# Patient Record
Sex: Male | Born: 1945 | Race: White | Hispanic: No | Marital: Single | Smoking: Former smoker
Health system: Southern US, Community
[De-identification: ages and names within clinical notes are randomized; demographics above are authoritative.]

## PROBLEM LIST (undated history)

## (undated) ENCOUNTER — Ambulatory Visit: Admission: EM | Payer: No Typology Code available for payment source

## (undated) DIAGNOSIS — E119 Type 2 diabetes mellitus without complications: Secondary | ICD-10-CM

## (undated) DIAGNOSIS — I10 Essential (primary) hypertension: Secondary | ICD-10-CM

## (undated) DIAGNOSIS — C801 Malignant (primary) neoplasm, unspecified: Secondary | ICD-10-CM

## (undated) HISTORY — PX: APPENDECTOMY: SHX54

## (undated) HISTORY — PX: COLON SURGERY: SHX602

---

## 2002-05-27 DIAGNOSIS — C19 Malignant neoplasm of rectosigmoid junction: Secondary | ICD-10-CM | POA: Insufficient documentation

## 2006-10-09 ENCOUNTER — Emergency Department: Payer: Self-pay | Admitting: Emergency Medicine

## 2006-10-11 ENCOUNTER — Emergency Department: Payer: Self-pay | Admitting: Emergency Medicine

## 2006-10-16 ENCOUNTER — Emergency Department: Payer: Self-pay | Admitting: Emergency Medicine

## 2006-10-19 ENCOUNTER — Emergency Department: Payer: Self-pay | Admitting: Emergency Medicine

## 2006-10-23 ENCOUNTER — Emergency Department: Payer: Self-pay

## 2006-10-30 ENCOUNTER — Emergency Department: Payer: Self-pay | Admitting: Emergency Medicine

## 2006-11-13 ENCOUNTER — Emergency Department: Payer: Self-pay | Admitting: Emergency Medicine

## 2009-05-17 DIAGNOSIS — C169 Malignant neoplasm of stomach, unspecified: Secondary | ICD-10-CM | POA: Insufficient documentation

## 2009-06-05 DIAGNOSIS — D630 Anemia in neoplastic disease: Secondary | ICD-10-CM | POA: Insufficient documentation

## 2009-08-05 ENCOUNTER — Emergency Department: Payer: Self-pay | Admitting: Emergency Medicine

## 2010-09-19 DIAGNOSIS — Z91148 Patient's other noncompliance with medication regimen for other reason: Secondary | ICD-10-CM | POA: Insufficient documentation

## 2011-01-15 DIAGNOSIS — M1710 Unilateral primary osteoarthritis, unspecified knee: Secondary | ICD-10-CM | POA: Insufficient documentation

## 2012-12-23 DIAGNOSIS — L80 Vitiligo: Secondary | ICD-10-CM | POA: Insufficient documentation

## 2013-03-24 DIAGNOSIS — G4700 Insomnia, unspecified: Secondary | ICD-10-CM | POA: Insufficient documentation

## 2013-05-07 DIAGNOSIS — M79671 Pain in right foot: Secondary | ICD-10-CM | POA: Insufficient documentation

## 2013-06-21 DIAGNOSIS — F32A Depression, unspecified: Secondary | ICD-10-CM | POA: Insufficient documentation

## 2013-08-13 ENCOUNTER — Ambulatory Visit: Payer: Self-pay | Admitting: Urology

## 2013-08-31 DIAGNOSIS — D126 Benign neoplasm of colon, unspecified: Secondary | ICD-10-CM | POA: Insufficient documentation

## 2013-10-01 ENCOUNTER — Emergency Department: Payer: Self-pay | Admitting: Emergency Medicine

## 2013-10-14 ENCOUNTER — Ambulatory Visit: Payer: Self-pay | Admitting: Urology

## 2013-10-14 DIAGNOSIS — E119 Type 2 diabetes mellitus without complications: Secondary | ICD-10-CM

## 2013-10-14 DIAGNOSIS — I10 Essential (primary) hypertension: Secondary | ICD-10-CM

## 2013-10-14 LAB — BASIC METABOLIC PANEL
Anion Gap: 7 (ref 7–16)
BUN: 22 mg/dL — ABNORMAL HIGH (ref 7–18)
CO2: 23 mmol/L (ref 21–32)
Calcium, Total: 8.6 mg/dL (ref 8.5–10.1)
Chloride: 111 mmol/L — ABNORMAL HIGH (ref 98–107)
Creatinine: 1.36 mg/dL — ABNORMAL HIGH (ref 0.60–1.30)
EGFR (Non-African Amer.): 52 — ABNORMAL LOW
Glucose: 172 mg/dL — ABNORMAL HIGH (ref 65–99)
Osmolality: 289 (ref 275–301)
Potassium: 3.9 mmol/L (ref 3.5–5.1)
Sodium: 141 mmol/L (ref 136–145)

## 2013-10-14 LAB — CBC
HCT: 39.1 % — AB (ref 40.0–52.0)
HGB: 12.2 g/dL — AB (ref 13.0–18.0)
MCH: 27.9 pg (ref 26.0–34.0)
MCHC: 31.2 g/dL — ABNORMAL LOW (ref 32.0–36.0)
MCV: 89 fL (ref 80–100)
Platelet: 191 10*3/uL (ref 150–440)
RBC: 4.37 10*6/uL — AB (ref 4.40–5.90)
RDW: 13.4 % (ref 11.5–14.5)
WBC: 7.4 10*3/uL (ref 3.8–10.6)

## 2013-10-25 ENCOUNTER — Ambulatory Visit: Payer: Self-pay | Admitting: Urology

## 2013-10-26 LAB — PATHOLOGY REPORT

## 2013-10-27 ENCOUNTER — Emergency Department: Payer: Self-pay | Admitting: Emergency Medicine

## 2013-11-03 ENCOUNTER — Ambulatory Visit: Payer: Self-pay | Admitting: Internal Medicine

## 2014-04-06 DIAGNOSIS — N401 Enlarged prostate with lower urinary tract symptoms: Secondary | ICD-10-CM | POA: Insufficient documentation

## 2014-05-28 NOTE — Op Note (Signed)
PATIENT NAME:  Joe Coleman, Joe Coleman MR#:  [id number] DATE OF BIRTH:  1945-10-28  DATE OF PROCEDURE:  10/25/2013  PREOPERATIVE DIAGNOSIS: Right epididymal cyst.   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   PROCEDURE: Right spermatocelectomy.   SURGEON: Lakenya Riendeau D. Elnoria Howard, DO  PROCEDURE IN DETAIL: With the patient sterilely prepped and draped and in the supine position, after an appropriate timeout, I injected the incision with 3 mL of 0.5% Marcaine and then proceeded to make a midline incision through the median raphe. This was carried down to the spermatocele under tension. I am able to peel the tissues off the spermatocele, the tunica vaginalis tissues, until I isolate the spermatocele from its origin off the epididymis, I incise it away from the epididymis intact and send it to pathology intact. I then carefully cauterize any minimal bleeding. I identified the cord and the epididymis and these were well away from the spermatocele. A drain is placed in the most dependent portion of the scrotum. This is a 1 inch Penrose, sutured in with Ethibond. Then I close the tunica vaginalis and the skin with a 3-0 chromic in a continuous fashion. Dermabond is placed over the incision and sterile dressings are placed. He is sent to recovery in satisfactory condition. Blood loss is zero.  ____________________________ Janice Coffin. Elnoria Howard, DO rdh:sb D: 10/25/2013 08:09:05 ET T: 10/25/2013 09:17:25 ET JOB#: [id number]  cc: Janice Coffin. Elnoria Howard, DO, <Dictator> Allanah Mcfarland D Tashiya Souders DO ELECTRONICALLY SIGNED 10/29/2013 15:37

## 2015-03-01 DIAGNOSIS — M792 Neuralgia and neuritis, unspecified: Secondary | ICD-10-CM | POA: Insufficient documentation

## 2015-05-31 DIAGNOSIS — G8929 Other chronic pain: Secondary | ICD-10-CM | POA: Insufficient documentation

## 2015-06-30 DIAGNOSIS — R1013 Epigastric pain: Secondary | ICD-10-CM | POA: Insufficient documentation

## 2016-04-01 ENCOUNTER — Emergency Department: Payer: Medicare Other

## 2016-04-01 ENCOUNTER — Encounter: Payer: Self-pay | Admitting: Emergency Medicine

## 2016-04-01 ENCOUNTER — Emergency Department
Admission: EM | Admit: 2016-04-01 | Discharge: 2016-04-01 | Disposition: A | Discharge status: Home / Self Care | Payer: Medicare Other | Attending: Emergency Medicine | Admitting: Emergency Medicine

## 2016-04-01 DIAGNOSIS — Z79899 Other long term (current) drug therapy: Secondary | ICD-10-CM | POA: Insufficient documentation

## 2016-04-01 DIAGNOSIS — I10 Essential (primary) hypertension: Secondary | ICD-10-CM | POA: Insufficient documentation

## 2016-04-01 DIAGNOSIS — Z7982 Long term (current) use of aspirin: Secondary | ICD-10-CM | POA: Insufficient documentation

## 2016-04-01 DIAGNOSIS — R079 Chest pain, unspecified: Secondary | ICD-10-CM

## 2016-04-01 DIAGNOSIS — R0602 Shortness of breath: Secondary | ICD-10-CM | POA: Diagnosis present

## 2016-04-01 DIAGNOSIS — Z859 Personal history of malignant neoplasm, unspecified: Secondary | ICD-10-CM | POA: Diagnosis not present

## 2016-04-01 DIAGNOSIS — J40 Bronchitis, not specified as acute or chronic: Secondary | ICD-10-CM | POA: Diagnosis not present

## 2016-04-01 DIAGNOSIS — Z87891 Personal history of nicotine dependence: Secondary | ICD-10-CM | POA: Diagnosis not present

## 2016-04-01 HISTORY — DX: Malignant (primary) neoplasm, unspecified: C80.1

## 2016-04-01 HISTORY — DX: Essential (primary) hypertension: I10

## 2016-04-01 LAB — BASIC METABOLIC PANEL
ANION GAP: 7 (ref 5–15)
BUN: 25 mg/dL — ABNORMAL HIGH (ref 6–20)
CALCIUM: 9 mg/dL (ref 8.9–10.3)
CHLORIDE: 106 mmol/L (ref 101–111)
CO2: 24 mmol/L (ref 22–32)
CREATININE: 1.39 mg/dL — AB (ref 0.61–1.24)
GFR calc Af Amer: 57 mL/min — ABNORMAL LOW (ref >60)
GFR calc non Af Amer: 49 mL/min — ABNORMAL LOW (ref >60)
Glucose, Bld: 143 mg/dL — ABNORMAL HIGH (ref 65–99)
Potassium: 5.4 mmol/L — ABNORMAL HIGH (ref 3.5–5.1)
SODIUM: 137 mmol/L (ref 135–145)

## 2016-04-01 LAB — CBC
HEMATOCRIT: 36.9 % — AB (ref 40.0–52.0)
HEMOGLOBIN: 12.3 g/dL — AB (ref 13.0–18.0)
MCH: 28.1 pg (ref 26.0–34.0)
MCHC: 33.3 g/dL (ref 32.0–36.0)
MCV: 84.5 fL (ref 80.0–100.0)
Platelets: 192 10*3/uL (ref 150–440)
RBC: 4.37 MIL/uL — ABNORMAL LOW (ref 4.40–5.90)
RDW: 14 % (ref 11.5–14.5)
WBC: 6.4 10*3/uL (ref 3.8–10.6)

## 2016-04-01 LAB — TROPONIN I

## 2016-04-01 LAB — BRAIN NATRIURETIC PEPTIDE: B NATRIURETIC PEPTIDE 5: 66 pg/mL (ref 0.0–100.0)

## 2016-04-01 MED ORDER — SODIUM CHLORIDE 0.9 % IV BOLUS (SEPSIS)
500.0000 mL | Freq: Once | INTRAVENOUS | Status: AC
  Administered 2016-04-01: 500 mL via INTRAVENOUS

## 2016-04-01 MED ORDER — IPRATROPIUM-ALBUTEROL 0.5-2.5 (3) MG/3ML IN SOLN
3.0000 mL | Freq: Once | RESPIRATORY_TRACT | Status: AC
  Administered 2016-04-01: 3 mL via RESPIRATORY_TRACT
  Filled 2016-04-01: qty 3

## 2016-04-01 MED ORDER — IOPAMIDOL (ISOVUE-370) INJECTION 76%
75.0000 mL | Freq: Once | INTRAVENOUS | Status: AC | PRN
  Administered 2016-04-01: 75 mL via INTRAVENOUS

## 2016-04-01 MED ORDER — ALBUTEROL SULFATE HFA 108 (90 BASE) MCG/ACT IN AERS: 2.0000 | INHALATION_SPRAY | Freq: Four times a day (QID) | RESPIRATORY_TRACT | 2 refills | Status: AC | PRN

## 2016-04-01 MED ORDER — ASPIRIN 81 MG PO CHEW
324.0000 mg | CHEWABLE_TABLET | Freq: Once | ORAL | Status: AC
  Administered 2016-04-01: 324 mg via ORAL
  Filled 2016-04-01: qty 4

## 2016-04-01 MED ORDER — DEXAMETHASONE 4 MG PO TABS
6.0000 mg | ORAL_TABLET | Freq: Once | ORAL | Status: AC
  Administered 2016-04-01: 6 mg via ORAL
  Filled 2016-04-01: qty 1.5

## 2016-04-01 NOTE — ED Notes (Signed)
Pt c/o shortness of breath since about 3am today; reports cough "for most of my life" that has not worsened since this time; mild chest pain to left chest; pt says he feels short of breath with exertion and at rest; sensation is worse when he is laying flat; no fever to report at home; talking in complete coherent sentences

## 2016-04-01 NOTE — ED Provider Notes (Signed)
Patient received in sign-out from Dr. Gerrit Halls.  Workup and evaluation pending CT imaging of the chest as well as repeat troponin to further risk stratify for ACS. Joe Coleman denies any chest pain at this time. CT imaging shows no evidence of pulmonary embolism but does suggest component of bronchitis with some nodules that were discussed with the patient. Repeat troponin is negative. Review of patient's presentation do not feel this is clinically consistent with ACS. Likely some component of bronchitis with pleurisy or costochondritis. Patient evidently is steady gait and is in no acute distress. The patient is appropriate for close outpatient follow-up.      Joe Lot, MD 04/01/16 [phone]

## 2016-04-01 NOTE — ED Provider Notes (Signed)
[facility] Emergency Department Provider Note  Time seen: 5:28 AM  I have reviewed the triage vital signs and the nursing notes.   HISTORY  Chief Complaint Shortness of Breath    HPI Joe Coleman is a 71 y.o. male who presents to the emergency department with shortness of breath. According to the patient for the past 3 hours he has been feeling short of breath. Denies any history of COPD or CHF. Patient does state he has problems with his breathing which she describes as shortness of breath from time to time. He states mild chest discomfort which she describes as a tightness sensation to the center of his chest. Denies any radiation of the pain. Denies any nausea or diaphoresis. Denies any leg pain or swelling.  No past medical history on file.  There are no active problems to display for this patient.   No past surgical history on file.  Prior to Admission medications   Not on File    Allergies not on file  No family history on file.  Social History Social History  Substance Use Topics  . Smoking status: Not on file  . Smokeless tobacco: Not on file  . Alcohol use Not on file    Review of Systems Constitutional: Negative for fever. Cardiovascular: Mild chest tightness Respiratory: Mild shortness of breath, denies any shortness of breath currently lying in the bed. Gastrointestinal: Negative for abdominal pain Musculoskeletal: No leg pain or swelling Neurological: Negative for headache 10-point ROS otherwise negative.  ____________________________________________   PHYSICAL EXAM:  Constitutional: Alert and oriented. Well appearing and in no distress. Eyes: Normal exam ENT   Head: Normocephalic and atraumatic   Mouth/Throat: Mucous membranes are moist. Cardiovascular: Normal rate, regular rhythm. No murmur Respiratory: Normal respiratory effort without tachypnea nor retractions. Breath sounds are clear. No wheezes rales or  rhonchi auscultated. Occasional cough. Gastrointestinal: Soft and nontender. No distention.   Musculoskeletal: Nontender with normal range of motion in all extremities. No lower extremity tenderness or edema. Neurologic:  Normal speech and language. No gross focal neurologic deficits  Skin:  Skin is warm, dry and intact.  Psychiatric: Mood and affect are normal.  ____________________________________________    EKG  EKG reviewed and interpreted by myself shows sinus rhythm at 66 bpm, narrow QRS, normal axis, normal intervals, no ST changes noted.  ____________________________________________    [id number]  CT pending  ____________________________________________   INITIAL IMPRESSION / ASSESSMENT AND PLAN / ED COURSE  Pertinent labs & imaging results that were available during my care of the patient were reviewed by me and considered in my medical decision making (see chart for details).  Patient presents to the emergency department with 3 hours of shortness of breath although denies any shortness breath currently he also states some chest tightness although also states that is largely resolved as well. We will check labs, chest x-ray, treat with a DuoNeb for symptom improvement although patient does not have wheeze currently on exam. EKG is reassuring.  CT pending. Repeat troponin pending. Patient care signed out to Dr. Quentin Cornwall.  ____________________________________________   FINAL CLINICAL IMPRESSION(S) / ED DIAGNOSES  Dyspnea    Harvest Dark, MD 04/06/16 [phone]

## 2016-04-01 NOTE — ED Notes (Signed)
Breathing treatment complete; faint expiratory wheezes to bases; rhonchi still present in upper lobes but not as evident; when pt sat forward for auscultation and took a deep breath, he sat back and pointed to one area to left mid chest area where pain was the worst; informed MD of same; he is going in now to follow up with pt

## 2016-04-01 NOTE — ED Notes (Signed)
Patient transported to X-ray via Biomedical scientist by [employer]

## 2016-04-01 NOTE — ED Notes (Signed)
Pharmacy notified to send Decadron dose. 

## 2016-04-01 NOTE — ED Notes (Signed)
Patient transported to CT 

## 2016-04-01 NOTE — ED Triage Notes (Signed)
Patient with complaint of shortness of breath and left side chest pain that started at 03:00 this morning.

## 2016-04-17 ENCOUNTER — Encounter: Payer: Self-pay | Admitting: Emergency Medicine

## 2016-04-17 ENCOUNTER — Emergency Department
Admission: EM | Admit: 2016-04-17 | Discharge: 2016-04-17 | Disposition: A | Discharge status: Home / Self Care | Payer: Medicare Other | Source: Home / Self Care | Attending: Emergency Medicine | Admitting: Emergency Medicine

## 2016-04-17 ENCOUNTER — Emergency Department
Admission: EM | Admit: 2016-04-17 | Discharge: 2016-04-18 | Disposition: A | Discharge status: Home / Self Care | Payer: Medicare Other | Attending: Emergency Medicine | Admitting: Emergency Medicine

## 2016-04-17 ENCOUNTER — Emergency Department: Payer: Medicare Other

## 2016-04-17 DIAGNOSIS — Z7984 Long term (current) use of oral hypoglycemic drugs: Secondary | ICD-10-CM | POA: Insufficient documentation

## 2016-04-17 DIAGNOSIS — Z79899 Other long term (current) drug therapy: Secondary | ICD-10-CM

## 2016-04-17 DIAGNOSIS — J42 Unspecified chronic bronchitis: Secondary | ICD-10-CM | POA: Diagnosis not present

## 2016-04-17 DIAGNOSIS — J40 Bronchitis, not specified as acute or chronic: Secondary | ICD-10-CM | POA: Insufficient documentation

## 2016-04-17 DIAGNOSIS — I10 Essential (primary) hypertension: Secondary | ICD-10-CM

## 2016-04-17 DIAGNOSIS — Z87891 Personal history of nicotine dependence: Secondary | ICD-10-CM | POA: Insufficient documentation

## 2016-04-17 DIAGNOSIS — R0602 Shortness of breath: Secondary | ICD-10-CM

## 2016-04-17 DIAGNOSIS — Z7982 Long term (current) use of aspirin: Secondary | ICD-10-CM

## 2016-04-17 DIAGNOSIS — Z859 Personal history of malignant neoplasm, unspecified: Secondary | ICD-10-CM | POA: Diagnosis not present

## 2016-04-17 LAB — BLOOD GAS, VENOUS
Acid-base deficit: 0.2 mmol/L (ref 0.0–2.0)
BICARBONATE: 24.8 mmol/L (ref 20.0–28.0)
O2 Saturation: 83.8 %
PH VEN: 7.39 (ref 7.250–7.430)
PO2 VEN: 49 mmHg — AB (ref 32.0–45.0)
Patient temperature: 37
pCO2, Ven: 41 mmHg — ABNORMAL LOW (ref 44.0–60.0)

## 2016-04-17 LAB — COMPREHENSIVE METABOLIC PANEL
ALT: 13 U/L — ABNORMAL LOW (ref 17–63)
ANION GAP: 6 (ref 5–15)
AST: 24 U/L (ref 15–41)
Albumin: 3.9 g/dL (ref 3.5–5.0)
Alkaline Phosphatase: 144 U/L — ABNORMAL HIGH (ref 38–126)
BUN: 23 mg/dL — ABNORMAL HIGH (ref 6–20)
CO2: 26 mmol/L (ref 22–32)
Calcium: 8.6 mg/dL — ABNORMAL LOW (ref 8.9–10.3)
Chloride: 107 mmol/L (ref 101–111)
Creatinine, Ser: 1.04 mg/dL (ref 0.61–1.24)
GFR calc non Af Amer: >60 mL/min (ref >60)
Glucose, Bld: 162 mg/dL — ABNORMAL HIGH (ref 65–99)
POTASSIUM: 4.2 mmol/L (ref 3.5–5.1)
SODIUM: 139 mmol/L (ref 135–145)
Total Bilirubin: 0.5 mg/dL (ref 0.3–1.2)
Total Protein: 7 g/dL (ref 6.5–8.1)

## 2016-04-17 LAB — CBC
HCT: 36.6 % — ABNORMAL LOW (ref 40.0–52.0)
Hemoglobin: 11.9 g/dL — ABNORMAL LOW (ref 13.0–18.0)
MCH: 27.5 pg (ref 26.0–34.0)
MCHC: 32.4 g/dL (ref 32.0–36.0)
MCV: 85 fL (ref 80.0–100.0)
PLATELETS: 182 10*3/uL (ref 150–440)
RBC: 4.31 MIL/uL — AB (ref 4.40–5.90)
RDW: 14.5 % (ref 11.5–14.5)
WBC: 6.8 10*3/uL (ref 3.8–10.6)

## 2016-04-17 LAB — TROPONIN I: Troponin I: <0.03 ng/mL (ref <0.03)

## 2016-04-17 LAB — BRAIN NATRIURETIC PEPTIDE: B NATRIURETIC PEPTIDE 5: 69 pg/mL (ref 0.0–100.0)

## 2016-04-17 MED ORDER — PREDNISONE 20 MG PO TABS: 60.0000 mg | ORAL_TABLET | Freq: Every day | ORAL | 0 refills | Status: DC

## 2016-04-17 MED ORDER — IPRATROPIUM-ALBUTEROL 0.5-2.5 (3) MG/3ML IN SOLN
3.0000 mL | Freq: Once | RESPIRATORY_TRACT | Status: AC
  Administered 2016-04-17: 3 mL via RESPIRATORY_TRACT
  Filled 2016-04-17: qty 3

## 2016-04-17 MED ORDER — PREDNISONE 20 MG PO TABS
60.0000 mg | ORAL_TABLET | Freq: Once | ORAL | Status: AC
  Administered 2016-04-17: 60 mg via ORAL
  Filled 2016-04-17: qty 3

## 2016-04-17 MED ORDER — ALBUTEROL SULFATE HFA 108 (90 BASE) MCG/ACT IN AERS: 2.0000 | INHALATION_SPRAY | Freq: Four times a day (QID) | RESPIRATORY_TRACT | 0 refills | Status: DC | PRN

## 2016-04-17 MED ORDER — AEROCHAMBER MV MISC: 0 refills | Status: DC

## 2016-04-17 NOTE — ED Provider Notes (Signed)
[facility] Emergency Department Provider Note   ____________________________________________    I have reviewed the triage vital signs and the nursing notes.   HISTORY  Chief Complaint Shortness of Breath     HPI Joe Coleman is a 71 y.o. male who presents with complaints of difficulty breathing at home. Patient seen last night and discharged with bronchitis. Was feeling well when he left the hospital but when he got home and lie down he felt that his breathing was more difficult and he was unable to sleep so he decided to come back. He feels quite well now and has no complaints. No chest pain. He continues with a cough that he has had for several weeks.   Past Medical History:  Diagnosis Date  . Cancer ([place])   . Hypertension     There are no active problems to display for this patient.   Past Surgical History:  Procedure Laterality Date  . APPENDECTOMY    . COLON SURGERY      Prior to Admission medications   Medication Sig Start Date End Date Taking? Authorizing Provider  albuterol (PROVENTIL HFA;VENTOLIN HFA) 108 (90 Base) MCG/ACT inhaler Inhale 2 puffs into the lungs every 6 (six) hours as needed for wheezing or shortness of breath. 04/01/16   Merlyn Lot, MD  albuterol (PROVENTIL HFA;VENTOLIN HFA) 108 (90 Base) MCG/ACT inhaler Inhale 2 puffs into the lungs every 6 (six) hours as needed for wheezing or shortness of breath. 04/17/16   Loney Hering, MD  aspirin EC 81 MG tablet Take 81 mg by mouth daily.    Historical Provider, MD  gabapentin (NEURONTIN) 300 MG capsule Take 300 mg by mouth at bedtime.    Historical Provider, MD  losartan (COZAAR) 25 MG tablet Take 1 tablet by mouth daily. 03/20/16   Historical Provider, MD  metFORMIN (GLUCOPHAGE) 500 MG tablet Take by mouth 2 (two) times daily with a meal.    Historical Provider, MD  predniSONE (DELTASONE) 20 MG tablet Take 3 tablets (60 mg total) by mouth daily. 04/17/16   Loney Hering, MD  Spacer/Aero-Holding Chambers (AEROCHAMBER MV) inhaler Use as instructed 04/17/16   Loney Hering, MD     Allergies Patient has no known allergies.  No family history on file.  Social History Social History  Substance Use Topics  . Smoking status: Former Research scientist (life sciences)  . Smokeless tobacco: Never Used  . Alcohol use No    Review of Systems  Constitutional: No fever/chills  ENT: Mild sore throat   Gastrointestinal: No abdominal pain.  No nausea, no vomiting.    Musculoskeletal: Negative for back pain.     ____________________________________________   PHYSICAL EXAM:  VITAL SIGNS: ED Triage Vitals [04/17/16 1217]  Enc Vitals Group     BP (!) 141/76     Pulse Rate 76     Resp 18     Temp 97.6 F (36.4 C)     Temp Source Oral     SpO2 99 %     Weight 155 lb (70.3 kg)     Height 5\' 8"  (1.727 m)     Head Circumference      Peak Flow      Pain Score 0     Pain Loc      Pain Edu?      Excl. in [place]?     Constitutional: Alert and oriented. No acute distress. Pleasant and interactive Eyes: Conjunctivae are normal.   Nose:  No congestion/rhinnorhea. Mouth/Throat: Mucous membranes are moist.   Cardiovascular: Normal rate, regular rhythm.  Respiratory: Normal respiratory effort.  No retractions.Clear to auscultation, no wheezing no rales Genitourinary: deferred Musculoskeletal: No lower extremity tenderness nor edema.   Neurologic:  Normal speech and language. No gross focal neurologic deficits are appreciated.   Skin:  Skin is warm, dry and intact. No rash noted.   ____________________________________________   LABS (all labs ordered are listed, but only abnormal results are displayed)  Labs Reviewed - No data to display ____________________________________________  EKG   ____________________________________________  RADIOLOGY  Reviewed studies from last night ____________________________________________   PROCEDURES  Procedure(s)  performed: No    Critical Care performed: No ____________________________________________   INITIAL IMPRESSION / ASSESSMENT AND PLAN / ED COURSE  Pertinent labs & imaging results that were available during my care of the patient were reviewed by me and considered in my medical decision making (see chart for details).  Patient well-appearing and in no acute distress. All vitals are normal. No increased work of breathing. In fact he says his breathing feels quite normal. No wheezing. No chest pain. Intermittent cough. He suspects his breathing difficulty was due to lying flat and I suspect this is true. He has not filled his prescription for steroids yet but he did have a dose last night.  Does not appear to be beneficial to recheck labs or x-ray as the patient admits to feeling quite well currently. He is willing to go home and try to sleep slightly more upright to see if this helps. He knows he can return at any time   ____________________________________________   FINAL CLINICAL IMPRESSION(S) / ED DIAGNOSES  Final diagnoses:  Bronchitis      NEW MEDICATIONS STARTED DURING THIS VISIT:  Discharge Medication List as of 04/17/2016  1:46 PM       Note:  This document was prepared using Dragon voice recognition software and may include unintentional dictation errors.    Lavonia Drafts, MD 04/17/16 [phone]

## 2016-04-17 NOTE — ED Provider Notes (Signed)
-----------------------------------------   9:07 AM on 04/17/2016 -----------------------------------------   Blood pressure 135/80, pulse 80, temperature 98.6 F (37 C), temperature source Oral, resp. rate 18, height 5\' 8"  (1.727 m), weight 155 lb (70.3 kg), SpO2 97 %.  Assuming care from Dr. Dahlia Client.  In short, Joe Coleman is a 71 y.o. male with a chief complaint of Shortness of Breath .  Refer to the original H&P for additional details.  The current plan of care is to follow the patient's repeat troponin which was negative at less than 0.03. Patient will be discharged per Dr. Pollyann Samples instructions.   Clinical Course as of Apr 17 905  Wed Apr 17, 2016  0645 Chronic bronchitic changes in the lungs. No evidence of active pulmonary disease   DG Chest 2 View [AW]    Clinical Course User Index [AW] Loney Hering, MD      Daymon Larsen, MD 04/17/16 [phone]

## 2016-04-17 NOTE — ED Notes (Signed)
Pt changing into a gown

## 2016-04-17 NOTE — ED Triage Notes (Signed)
Pt just discharged this am with bronchitis, was told it would take 24hrs for prednisone to work to help him breath better. Was told to come back after taking meds if not any better. Pt went home and comes back stating he felt like he couldn't breath. No resp distress noted, pt ambulated to stat desk with no difficulty noted.

## 2016-04-17 NOTE — ED Notes (Signed)
Pt talking in complete sentences. No distress noted. Ambulatory upon DC

## 2016-04-17 NOTE — ED Triage Notes (Signed)
Pt in with co shob since tonight, states has had productive cough with greenish sputum. Denies any pain, no shob at this time. States was seen here for same symptoms 2 weeks ago and was told all tests were wnl.

## 2016-04-17 NOTE — ED Triage Notes (Signed)
Pt states this is his third visit to ed today for shob. Pt states he went home after discharge "and I can't breathe in my house". Pt denies shob currently. Appears in no acute distress.

## 2016-04-17 NOTE — Discharge Instructions (Signed)
Please follow up with your primary care physician.

## 2016-04-17 NOTE — ED Provider Notes (Signed)
[facility] Emergency Department Provider Note   ____________________________________________   First MD Initiated Contact with Patient 04/17/16 [phone]     (approximate)  I have reviewed the triage vital signs and the nursing notes.   HISTORY  Chief Complaint Shortness of Breath    HPI Joe Coleman is a 71 y.o. male who comes into the hospital today with some shortness of breath. The patient reports that he can hardly breathe and has been on and off for the past 3-4 months. The patient was here just a few weeks ago he reports with the same symptoms. He reports that when he lays down to sleep when he gets up he has a hard time catching his breath and has to walk around. The patient reports is also had some chest pain on the left side of his chest. He rates his pain a 5 out of 10 in intensity. He's had some nausea with no vomiting no sweats and no dizziness or lightheadedness. He reports that he has inhalers and they sometimes helps and sometimes don't. He feels like he needs a nebulizer for home the patient has not seen his primary care physician for these symptoms. He has not had any fever. He's had intermittent cough as well as nonproductive. The patient is here today for evaluation of his symptoms.   Past Medical History:  Diagnosis Date  . Cancer ([place])   . Hypertension     There are no active problems to display for this patient.   Past Surgical History:  Procedure Laterality Date  . APPENDECTOMY    . COLON SURGERY      Prior to Admission medications   Medication Sig Start Date End Date Taking? Authorizing Provider  albuterol (PROVENTIL HFA;VENTOLIN HFA) 108 (90 Base) MCG/ACT inhaler Inhale 2 puffs into the lungs every 6 (six) hours as needed for wheezing or shortness of breath. 04/01/16  Yes Merlyn Lot, MD  aspirin EC 81 MG tablet Take 81 mg by mouth daily.   Yes Historical Provider, MD  gabapentin (NEURONTIN) 300 MG capsule Take 300 mg by  mouth at bedtime.   Yes Historical Provider, MD  losartan (COZAAR) 25 MG tablet Take 1 tablet by mouth daily. 03/20/16  Yes Historical Provider, MD  metFORMIN (GLUCOPHAGE) 500 MG tablet Take by mouth 2 (two) times daily with a meal.   Yes Historical Provider, MD  albuterol (PROVENTIL HFA;VENTOLIN HFA) 108 (90 Base) MCG/ACT inhaler Inhale 2 puffs into the lungs every 6 (six) hours as needed for wheezing or shortness of breath. 04/17/16   Loney Hering, MD  predniSONE (DELTASONE) 20 MG tablet Take 3 tablets (60 mg total) by mouth daily. 04/17/16   Loney Hering, MD  Spacer/Aero-Holding Chambers (AEROCHAMBER MV) inhaler Use as instructed 04/17/16   Loney Hering, MD    Allergies Patient has no known allergies.  No family history on file.  Social History Social History  Substance Use Topics  . Smoking status: Former Research scientist (life sciences)  . Smokeless tobacco: Never Used  . Alcohol use No    Review of Systems Constitutional: No fever/chills Eyes: No visual changes. ENT: No sore throat. Cardiovascular: chest pain. Respiratory:  shortness of breath. Gastrointestinal: No abdominal pain.  No nausea, no vomiting.  No diarrhea.  No constipation. Genitourinary: Negative for dysuria. Musculoskeletal: Negative for back pain. Skin: Negative for rash. Neurological: Negative for headaches, focal weakness or numbness.  10-point ROS otherwise negative.  ____________________________________________   PHYSICAL EXAM:  VITAL SIGNS: ED  Triage Vitals  Enc Vitals Group     BP 04/17/16 0420 (!) 189/90     Pulse Rate 04/17/16 0420 75     Resp 04/17/16 0420 20     Temp 04/17/16 0420 98.6 F (37 C)     Temp Source 04/17/16 0420 Oral     SpO2 04/17/16 0420 100 %     Weight 04/17/16 0421 155 lb (70.3 kg)     Height 04/17/16 0421 5\' 8"  (1.727 m)     Head Circumference --      Peak Flow --      Pain Score 04/17/16 0421 0     Pain Loc --      Pain Edu? --      Excl. in [place]? --     Constitutional:  Alert and oriented. Well appearing and in mild distress. Eyes: Conjunctivae are normal. PERRL. EOMI. Head: Atraumatic. Nose: No congestion/rhinnorhea. Mouth/Throat: Mucous membranes are moist.  Oropharynx non-erythematous. Cardiovascular: Normal rate, regular rhythm. Grossly normal heart sounds.  Good peripheral circulation. Respiratory: Normal respiratory effort.  No retractions. Lungs CTAB. Gastrointestinal: Soft and nontender. No distention. Positive bowel sounds Musculoskeletal: No lower extremity tenderness nor edema.   Neurologic:  Normal speech and language.  Skin:  Skin is warm, dry and intact.  Psychiatric: Mood and affect are normal.   ____________________________________________   LABS (all labs ordered are listed, but only abnormal results are displayed)  Labs Reviewed  CBC - Abnormal; Notable for the following:       Result Value   RBC 4.31 (*)    Hemoglobin 11.9 (*)    HCT 36.6 (*)    All other components within normal limits  COMPREHENSIVE METABOLIC PANEL - Abnormal; Notable for the following:    Glucose, Bld 162 (*)    BUN 23 (*)    Calcium 8.6 (*)    ALT 13 (*)    Alkaline Phosphatase 144 (*)    All other components within normal limits  BLOOD GAS, VENOUS - Abnormal; Notable for the following:    pCO2, Ven 41 (*)    pO2, Ven 49.0 (*)    All other components within normal limits  TROPONIN I  BRAIN NATRIURETIC PEPTIDE  TROPONIN I   ____________________________________________  EKG  ED ECG REPORT I, Loney Hering, the attending physician, personally viewed and interpreted this ECG.   Date: 04/17/2016  EKG Time: 423  Rate: 69  Rhythm: normal sinus rhythm  Axis: normal  Intervals:none  ST&T Change: none  ____________________________________________  RADIOLOGY  CXR ____________________________________________   PROCEDURES  Procedure(s) performed: None  Procedures  Critical Care performed:  No  ____________________________________________   INITIAL IMPRESSION / ASSESSMENT AND PLAN / ED COURSE  Pertinent labs & imaging results that were available during my care of the patient were reviewed by me and considered in my medical decision making (see chart for details).  This 71 year old male who comes into the hospital today with some shortness of breath. He was seen previously and received the CT as well as blood work. He was found to have bronchitis but did not receive any bronchitis treatment. I will give him a dose of prednisone and I will repeat his troponin.  Clinical Course as of Apr 17 817  Wed Apr 17, 2016  0645 Chronic bronchitic changes in the lungs. No evidence of active pulmonary disease   DG Chest 2 View [AW]    Clinical Course User Index [AW] Loney Hering, MD  The patient's care will be signed out to Dr Marcelene Butte who will follow up the results of the repeat troponin and disposition the patient ____________________________________________   FINAL CLINICAL IMPRESSION(S) / ED DIAGNOSES  Final diagnoses:  Shortness of breath  Bronchitis      NEW MEDICATIONS STARTED DURING THIS VISIT:  New Prescriptions   ALBUTEROL (PROVENTIL HFA;VENTOLIN HFA) 108 (90 BASE) MCG/ACT INHALER    Inhale 2 puffs into the lungs every 6 (six) hours as needed for wheezing or shortness of breath.   PREDNISONE (DELTASONE) 20 MG TABLET    Take 3 tablets (60 mg total) by mouth daily.   SPACER/AERO-HOLDING CHAMBERS (AEROCHAMBER MV) INHALER    Use as instructed     Note:  This document was prepared using Dragon voice recognition software and may include unintentional dictation errors.    Loney Hering, MD 04/17/16 [phone]

## 2016-04-17 NOTE — ED Notes (Signed)
Pt requesting peanut butter , crackers and ginger ale will speak with the EDP

## 2016-04-18 DIAGNOSIS — J42 Unspecified chronic bronchitis: Secondary | ICD-10-CM | POA: Diagnosis not present

## 2016-04-18 MED ORDER — IPRATROPIUM-ALBUTEROL 0.5-2.5 (3) MG/3ML IN SOLN
3.0000 mL | Freq: Once | RESPIRATORY_TRACT | Status: AC
  Administered 2016-04-18: 3 mL via RESPIRATORY_TRACT

## 2016-04-18 MED ORDER — PREDNISONE 20 MG PO TABS
60.0000 mg | ORAL_TABLET | Freq: Once | ORAL | Status: AC
  Administered 2016-04-18: 60 mg via ORAL
  Filled 2016-04-18: qty 3

## 2016-04-18 NOTE — ED Notes (Signed)
Report to butch, rn.  

## 2016-04-18 NOTE — ED Provider Notes (Signed)
[facility] Emergency Department Provider Note   First MD Initiated Contact with Patient 04/18/16 0005     (approximate)  I have reviewed the triage vital signs and the nursing notes.   HISTORY  Chief Complaint Shortness of Breath    HPI Joe Coleman is a 71 y.o. male with history of hypertension and chronic bronchitis returns to the emergency department with complaint of dyspnea at home. Patient states he is fine when he is not in his home however whenever he attempts to go to sleep he had difficulty breathing which prompted his return to the emergency department. Patient has been seen 3 times recently for the same with CT scan on 04/01/2016 consistent with chronic bronchitis. Patient denies any chest pain no lower extremity pain or swelling.   Past Medical History:  Diagnosis Date  . Cancer ([place])   . Hypertension     There are no active problems to display for this patient.   Past Surgical History:  Procedure Laterality Date  . APPENDECTOMY    . COLON SURGERY      Prior to Admission medications   Medication Sig Start Date End Date Taking? Authorizing Provider  albuterol (PROVENTIL HFA;VENTOLIN HFA) 108 (90 Base) MCG/ACT inhaler Inhale 2 puffs into the lungs every 6 (six) hours as needed for wheezing or shortness of breath. 04/01/16   Merlyn Lot, MD  albuterol (PROVENTIL HFA;VENTOLIN HFA) 108 (90 Base) MCG/ACT inhaler Inhale 2 puffs into the lungs every 6 (six) hours as needed for wheezing or shortness of breath. 04/17/16   Loney Hering, MD  aspirin EC 81 MG tablet Take 81 mg by mouth daily.    Historical Provider, MD  gabapentin (NEURONTIN) 300 MG capsule Take 300 mg by mouth at bedtime.    Historical Provider, MD  losartan (COZAAR) 25 MG tablet Take 1 tablet by mouth daily. 03/20/16   Historical Provider, MD  metFORMIN (GLUCOPHAGE) 500 MG tablet Take by mouth 2 (two) times daily with a meal.    Historical Provider, MD  predniSONE  (DELTASONE) 20 MG tablet Take 3 tablets (60 mg total) by mouth daily. 04/17/16   Loney Hering, MD  Spacer/Aero-Holding Chambers (AEROCHAMBER MV) inhaler Use as instructed 04/17/16   Loney Hering, MD    Allergies Patient has no known allergies.  No family history on file.  Social History Social History  Substance Use Topics  . Smoking status: Former Research scientist (life sciences)  . Smokeless tobacco: Never Used  . Alcohol use No    Review of Systems Constitutional: No fever/chills Eyes: No visual changes. ENT: No sore throat. Cardiovascular: Denies chest pain. Respiratory: Positive for shortness of breath. Gastrointestinal: No abdominal pain.  No nausea, no vomiting.  No diarrhea.  No constipation. Genitourinary: Negative for dysuria. Musculoskeletal: Negative for back pain. Skin: Negative for rash. Neurological: Negative for headaches, focal weakness or numbness.  10-point ROS otherwise negative.  ____________________________________________   PHYSICAL EXAM:  VITAL SIGNS: ED Triage Vitals  Enc Vitals Group     BP 04/17/16 1959 (!) 159/80     Pulse Rate 04/17/16 1959 83     Resp 04/17/16 1959 16     Temp 04/17/16 1959 98 F (36.7 C)     Temp Source 04/17/16 1959 Oral     SpO2 04/17/16 1959 98 %     Weight 04/17/16 1959 155 lb (70.3 kg)     Height 04/17/16 1959 5\' 8"  (1.727 m)     Head Circumference --  Peak Flow --      Pain Score 04/18/16 0032 0     Pain Loc --      Pain Edu? --      Excl. in [place]? --     Constitutional: Alert and oriented. Well appearing and in no acute distress. Eyes: Conjunctivae are normal. PERRL. EOMI. Head: Atraumatic. Mouth/Throat: Mucous membranes are moist.  Oropharynx non-erythematous. Neck: No stridor.  Cardiovascular: Normal rate, regular rhythm. Good peripheral circulation. Grossly normal heart sounds. Respiratory: Normal respiratory effort.  No retractions. Lungs CTAB. Gastrointestinal: Soft and nontender. No distention.    Musculoskeletal: No lower extremity tenderness nor edema. No gross deformities of extremities. Neurologic:  Normal speech and language. No gross focal neurologic deficits are appreciated.  Skin:  Skin is warm, dry and intact. No rash noted. Psychiatric: Mood and affect are normal. Speech and behavior are normal.    Procedures   __   INITIAL IMPRESSION / ASSESSMENT AND PLAN / ED COURSE  Pertinent labs & imaging results that were available during my care of the patient were reviewed by me and considered in my medical decision making (see chart for details).  Patient given a single DuoNeb and 60 mg prednisone emergency department with resolution of symptoms.      ____________________________________________  FINAL CLINICAL IMPRESSION(S) / ED DIAGNOSES  Final diagnoses:  Chronic bronchitis, unspecified chronic bronchitis type ([place])     MEDICATIONS GIVEN DURING THIS VISIT:  Medications  ipratropium-albuterol (DUONEB) 0.5-2.5 (3) MG/3ML nebulizer solution 3 mL (3 mLs Nebulization Given 04/18/16 0044)  predniSONE (DELTASONE) tablet 60 mg (60 mg Oral Given 04/18/16 0044)     NEW OUTPATIENT MEDICATIONS STARTED DURING THIS VISIT:  New Prescriptions   No medications on file    Modified Medications   No medications on file    Discontinued Medications   No medications on file     Note:  This document was prepared using Dragon voice recognition software and may include unintentional dictation errors.    Gregor Hams, MD 04/19/16 [id number]

## 2016-09-15 ENCOUNTER — Emergency Department
Admission: EM | Admit: 2016-09-15 | Discharge: 2016-09-15 | Disposition: A | Discharge status: Home / Self Care | Payer: Medicare Other | Attending: Emergency Medicine | Admitting: Emergency Medicine

## 2016-09-15 ENCOUNTER — Encounter: Payer: Self-pay | Admitting: Emergency Medicine

## 2016-09-15 DIAGNOSIS — Z7984 Long term (current) use of oral hypoglycemic drugs: Secondary | ICD-10-CM | POA: Diagnosis not present

## 2016-09-15 DIAGNOSIS — L089 Local infection of the skin and subcutaneous tissue, unspecified: Secondary | ICD-10-CM | POA: Insufficient documentation

## 2016-09-15 DIAGNOSIS — Y998 Other external cause status: Secondary | ICD-10-CM | POA: Diagnosis not present

## 2016-09-15 DIAGNOSIS — Z7982 Long term (current) use of aspirin: Secondary | ICD-10-CM | POA: Diagnosis not present

## 2016-09-15 DIAGNOSIS — Y92017 Garden or yard in single-family (private) house as the place of occurrence of the external cause: Secondary | ICD-10-CM | POA: Diagnosis not present

## 2016-09-15 DIAGNOSIS — S80861A Insect bite (nonvenomous), right lower leg, initial encounter: Secondary | ICD-10-CM | POA: Insufficient documentation

## 2016-09-15 DIAGNOSIS — W57XXXA Bitten or stung by nonvenomous insect and other nonvenomous arthropods, initial encounter: Secondary | ICD-10-CM | POA: Diagnosis not present

## 2016-09-15 DIAGNOSIS — E119 Type 2 diabetes mellitus without complications: Secondary | ICD-10-CM | POA: Insufficient documentation

## 2016-09-15 DIAGNOSIS — Z87891 Personal history of nicotine dependence: Secondary | ICD-10-CM | POA: Diagnosis not present

## 2016-09-15 DIAGNOSIS — Y93H2 Activity, gardening and landscaping: Secondary | ICD-10-CM | POA: Diagnosis not present

## 2016-09-15 DIAGNOSIS — Z79899 Other long term (current) drug therapy: Secondary | ICD-10-CM | POA: Diagnosis not present

## 2016-09-15 DIAGNOSIS — I10 Essential (primary) hypertension: Secondary | ICD-10-CM | POA: Diagnosis not present

## 2016-09-15 HISTORY — DX: Type 2 diabetes mellitus without complications: E11.9

## 2016-09-15 MED ORDER — CEPHALEXIN 500 MG PO CAPS: 500.0000 mg | ORAL_CAPSULE | Freq: Four times a day (QID) | ORAL | 0 refills | Status: DC

## 2016-09-15 MED ORDER — CEPHALEXIN 500 MG PO CAPS
500.0000 mg | ORAL_CAPSULE | Freq: Once | ORAL | Status: AC
  Administered 2016-09-15: 500 mg via ORAL
  Filled 2016-09-15: qty 1

## 2016-09-15 NOTE — ED Triage Notes (Signed)
Pt says he was weed-eating in the yard yesterday and now has 2 small blistered areas to right lower leg; mild redness to lower leg; pt denies pain, "just sore when you touch it"; ambulatory with steady gait

## 2016-09-15 NOTE — ED Provider Notes (Signed)
[facility] Emergency Department Provider Note  ____________________________________________  Time seen: Approximately 8:59 PM  I have reviewed the triage vital signs and the nursing notes.   HISTORY  Chief Complaint Insect Bite    HPI Joe Coleman is a 71 y.o. male who presents emergency department complaining of infected bug bites to his right lower extremity. Patient reports that he was outside doing yard work yesterday noticed 2 small red lesions and then woke up this morning with small pustules over that area. Patient reports tenderness only with pressure. He denies any systemic complaints of fevers or chills, dull pain, nausea or vomiting, lower extremity pain or swelling. Patient denies any other complaints at this time. No medications.   Past Medical History:  Diagnosis Date  . Cancer ([place])   . Diabetes mellitus without complication ([place])   . Hypertension     There are no active problems to display for this patient.   Past Surgical History:  Procedure Laterality Date  . APPENDECTOMY    . COLON SURGERY      Prior to Admission medications   Medication Sig Start Date End Date Taking? Authorizing Provider  albuterol (PROVENTIL HFA;VENTOLIN HFA) 108 (90 Base) MCG/ACT inhaler Inhale 2 puffs into the lungs every 6 (six) hours as needed for wheezing or shortness of breath. 04/01/16   Merlyn Lot, MD  albuterol (PROVENTIL HFA;VENTOLIN HFA) 108 (90 Base) MCG/ACT inhaler Inhale 2 puffs into the lungs every 6 (six) hours as needed for wheezing or shortness of breath. 04/17/16   Loney Hering, MD  aspirin EC 81 MG tablet Take 81 mg by mouth daily.    [provider]  cephALEXin (KEFLEX) 500 MG capsule Take 1 capsule (500 mg total) by mouth 4 (four) times daily. 09/15/16   Dixon Luczak, Charline Bills, PA-C  gabapentin (NEURONTIN) 300 MG capsule Take 300 mg by mouth at bedtime.    [provider]  losartan (COZAAR) 25 MG tablet Take 1  tablet by mouth daily. 03/20/16   [provider]  metFORMIN (GLUCOPHAGE) 500 MG tablet Take by mouth 2 (two) times daily with a meal.    [provider]  predniSONE (DELTASONE) 20 MG tablet Take 3 tablets (60 mg total) by mouth daily. 04/17/16   Loney Hering, MD  Spacer/Aero-Holding Chambers (AEROCHAMBER MV) inhaler Use as instructed 04/17/16   Loney Hering, MD    Allergies Poultry meal  History reviewed. No pertinent family history.  Social History Social History  Substance Use Topics  . Smoking status: Former Research scientist (life sciences)  . Smokeless tobacco: Never Used  . Alcohol use No     Review of Systems  Constitutional: No fever/chills Eyes: No visual changes.  Cardiovascular: no chest pain. Respiratory: no cough. No SOB. Gastrointestinal: No abdominal pain.  No nausea, no vomiting.  Musculoskeletal: Negative for musculoskeletal pain. Skin: Positive for infected bug bites to the right lower extremity Neurological: Negative for headaches, focal weakness or numbness. 10-point ROS otherwise negative.  ____________________________________________   PHYSICAL EXAM:  VITAL SIGNS: ED Triage Vitals  Enc Vitals Group     BP 09/15/16 1955 (!) 148/65     Pulse Rate 09/15/16 1955 64     Resp 09/15/16 1955 18     Temp 09/15/16 1955 98.4 F (36.9 C)     Temp Source 09/15/16 1955 Oral     SpO2 09/15/16 1955 100 %     Weight 09/15/16 1954 155 lb (70.3 kg)     Height 09/15/16  1954 5\' 8"  (1.727 m)     Head Circumference --      Peak Flow --      Pain Score 09/15/16 2022 0     Pain Loc --      Pain Edu? --      Excl. in [place]? --      Constitutional: Alert and oriented. Well appearing and in no acute distress. Eyes: Conjunctivae are normal. PERRL. EOMI. Head: Atraumatic. Neck: No stridor.   Cardiovascular: Normal rate, regular rhythm. Normal S1 and S2.  Good peripheral circulation. Respiratory: Normal respiratory effort without tachypnea or retractions. Lungs  CTAB. Good air entry to the bases with no decreased or absent breath sounds. Musculoskeletal: Full range of motion to all extremities. No gross deformities appreciated. Neurologic:  Normal speech and language. No gross focal neurologic deficits are appreciated.  Skin:  Skin is warm, dry and intact. No rash noted. 2 erythematous papules noted to the right medial lower leg. Consistent with infected complaints. No surrounding erythema or anemia. No fluctuance or induration. No pustular drainage. Psychiatric: Mood and affect are normal. Speech and behavior are normal. Patient exhibits appropriate insight and judgement.   ____________________________________________   LABS (all labs ordered are listed, but only abnormal results are displayed)  Labs Reviewed - No data to display ____________________________________________  EKG   ____________________________________________  RADIOLOGY   No results found.  ____________________________________________    PROCEDURES  Procedure(s) performed:    Procedures    Medications  cephALEXin (KEFLEX) capsule 500 mg (not administered)     ____________________________________________   INITIAL IMPRESSION / ASSESSMENT AND PLAN / ED COURSE  Pertinent labs & imaging results that were available during my care of the patient were reviewed by me and considered in my medical decision making (see chart for details).  Review of the [place] CSRS was performed in accordance of the [place] prior to dispensing any controlled drugs.     Patient's diagnosis is consistent with infected bug bites to the right lower leg.. Patient will be discharged home with prescriptions for Keflex. Patient is to follow up with primary care as needed or otherwise directed. Patient is given ED precautions to return to the ED for any worsening or new symptoms.     ____________________________________________  FINAL CLINICAL IMPRESSION(S) / ED DIAGNOSES  Final  diagnoses:  Bug bite with infection, initial encounter      NEW MEDICATIONS STARTED DURING THIS VISIT:  New Prescriptions   CEPHALEXIN (KEFLEX) 500 MG CAPSULE    Take 1 capsule (500 mg total) by mouth 4 (four) times daily.        This chart was dictated using voice recognition software/Dragon. Despite best efforts to proofread, errors can occur which can change the meaning. Any change was purely unintentional.    Brynda Peon 09/15/16 2118    Darel Hong, MD 09/15/16 2213

## 2016-10-22 DIAGNOSIS — R053 Chronic cough: Secondary | ICD-10-CM | POA: Insufficient documentation

## 2016-11-01 ENCOUNTER — Emergency Department: Payer: Medicare Other

## 2016-11-01 ENCOUNTER — Encounter: Payer: Self-pay | Admitting: Emergency Medicine

## 2016-11-01 ENCOUNTER — Emergency Department
Admission: EM | Admit: 2016-11-01 | Discharge: 2016-11-01 | Disposition: A | Discharge status: Home / Self Care | Payer: Medicare Other | Attending: Emergency Medicine | Admitting: Emergency Medicine

## 2016-11-01 DIAGNOSIS — S0990XA Unspecified injury of head, initial encounter: Secondary | ICD-10-CM

## 2016-11-01 DIAGNOSIS — Y939 Activity, unspecified: Secondary | ICD-10-CM | POA: Diagnosis not present

## 2016-11-01 DIAGNOSIS — Z7984 Long term (current) use of oral hypoglycemic drugs: Secondary | ICD-10-CM | POA: Insufficient documentation

## 2016-11-01 DIAGNOSIS — E119 Type 2 diabetes mellitus without complications: Secondary | ICD-10-CM | POA: Diagnosis not present

## 2016-11-01 DIAGNOSIS — W19XXXA Unspecified fall, initial encounter: Secondary | ICD-10-CM

## 2016-11-01 DIAGNOSIS — Z79899 Other long term (current) drug therapy: Secondary | ICD-10-CM | POA: Diagnosis not present

## 2016-11-01 DIAGNOSIS — I10 Essential (primary) hypertension: Secondary | ICD-10-CM | POA: Diagnosis not present

## 2016-11-01 DIAGNOSIS — W010XXA Fall on same level from slipping, tripping and stumbling without subsequent striking against object, initial encounter: Secondary | ICD-10-CM | POA: Insufficient documentation

## 2016-11-01 DIAGNOSIS — Y929 Unspecified place or not applicable: Secondary | ICD-10-CM | POA: Insufficient documentation

## 2016-11-01 DIAGNOSIS — Y999 Unspecified external cause status: Secondary | ICD-10-CM | POA: Insufficient documentation

## 2016-11-01 DIAGNOSIS — M25512 Pain in left shoulder: Secondary | ICD-10-CM | POA: Diagnosis not present

## 2016-11-01 DIAGNOSIS — Z7982 Long term (current) use of aspirin: Secondary | ICD-10-CM | POA: Insufficient documentation

## 2016-11-01 NOTE — ED Notes (Signed)
Pt states was walking down sidewalk when stepped off curb, falling face first onto pavement. Left shoulder pain with ROM, minor superficial abrasions/lac to face with bleeding controlled.

## 2016-11-01 NOTE — ED Notes (Signed)
Signature pad not responding, pt signed hard copy of ED discharge and placed on pt's chart.

## 2016-11-01 NOTE — ED Notes (Signed)

## 2016-11-01 NOTE — ED Notes (Signed)
Discussed pt with Dr. Cinda Quest. Verbal orders given for CT of the head, C spine, and face, and x-ray of the left humerus and left shoulder.

## 2016-11-01 NOTE — Discharge Instructions (Signed)
Your CT scans of the head and neck, as well as xrays of the shouler, were all unremarkable. Follow up with your doctor for continued monitoring of your symptoms.

## 2016-11-01 NOTE — ED Triage Notes (Signed)
Pt states that he fell today at [place] and landed on his left side and also hit his face on the pavement. Pt has swelling and abrasion to the nose and abrasion above the right eye. Pt also c/o upper left arm pain. Pt takes 81 mg aspirin every day.

## 2016-11-01 NOTE — ED Provider Notes (Signed)
[facility] Emergency Department Provider Note  ____________________________________________  Time seen: Approximately 7:08 PM  I have reviewed the triage vital signs and the nursing notes.   HISTORY  Chief Complaint Fall    HPI Joe Coleman is a 71 y.o. male who complains of pain in the left shoulder and in the nose after a fall today. He was stepping off the curb at this given when he tripped and fell onto his face. Denies loss of consciousness. No neck pain vision changes or lateralizing weakness. He does have left shoulder pain because he also had his left arm outstretched. No aggravating or alleviating factors. Constant, moderate intensity.     Past Medical History:  Diagnosis Date  . Cancer ([place])   . Diabetes mellitus without complication ([place])   . Hypertension      There are no active problems to display for this patient.    Past Surgical History:  Procedure Laterality Date  . APPENDECTOMY    . COLON SURGERY       Prior to Admission medications   Medication Sig Start Date End Date Taking? Authorizing Provider  albuterol (PROVENTIL HFA;VENTOLIN HFA) 108 (90 Base) MCG/ACT inhaler Inhale 2 puffs into the lungs every 6 (six) hours as needed for wheezing or shortness of breath. 04/01/16   Merlyn Lot, MD  albuterol (PROVENTIL HFA;VENTOLIN HFA) 108 (90 Base) MCG/ACT inhaler Inhale 2 puffs into the lungs every 6 (six) hours as needed for wheezing or shortness of breath. 04/17/16   Loney Hering, MD  aspirin EC 81 MG tablet Take 81 mg by mouth daily.    [provider]  cephALEXin (KEFLEX) 500 MG capsule Take 1 capsule (500 mg total) by mouth 4 (four) times daily. 09/15/16   Cuthriell, Charline Bills, PA-C  gabapentin (NEURONTIN) 300 MG capsule Take 300 mg by mouth at bedtime.    [provider]  losartan (COZAAR) 25 MG tablet Take 1 tablet by mouth daily. 03/20/16   [provider]  metFORMIN (GLUCOPHAGE) 500 MG  tablet Take by mouth 2 (two) times daily with a meal.    [provider]  predniSONE (DELTASONE) 20 MG tablet Take 3 tablets (60 mg total) by mouth daily. 04/17/16   Loney Hering, MD  Spacer/Aero-Holding Chambers (AEROCHAMBER MV) inhaler Use as instructed 04/17/16   Loney Hering, MD     Allergies Poultry meal   No family history on file.  Social History Social History  Substance Use Topics  . Smoking status: Former Research scientist (life sciences)  . Smokeless tobacco: Never Used  . Alcohol use No    Review of Systems  Constitutional:   No fever or chills.  ENT:   No sore throat. No rhinorrhea. Cardiovascular:   No chest pain or syncope. Respiratory:   No dyspnea or cough. Gastrointestinal:   Negative for abdominal pain, vomiting and diarrhea.  Musculoskeletal:   left shoulder pain as above All other systems reviewed and are negative except as documented above in ROS and HPI.  ____________________________________________   PHYSICAL EXAM:  VITAL SIGNS: ED Triage Vitals  Enc Vitals Group     BP 11/01/16 1638 126/77     Pulse Rate 11/01/16 1638 71     Resp 11/01/16 1638 16     Temp 11/01/16 1638 97.7 F (36.5 C)     Temp Source 11/01/16 1638 Oral     SpO2 11/01/16 1638 99 %     Weight 11/01/16 1639 155 lb (70.3 kg)  Height 11/01/16 1639 5\' 8"  (1.727 m)     Head Circumference --      Peak Flow --      Pain Score 11/01/16 1638 10     Pain Loc --      Pain Edu? --      Excl. in [place]? --     Vital signs reviewed, nursing assessments reviewed.   Constitutional:   Alert and oriented. Well appearing and in no distress. Eyes:   No scleral icterus.  EOMI. No nystagmus. No conjunctival pallor. PERRL. ENT   Head:   Normocephalica small abrasion over the bridge of the nose. Hemostatic.Marland Kitchen   Nose:   No congestion/rhinnorhea. dried blood in the right nostril, anterior source identified. No active bleeding.   Mouth/Throat:   MMM, no pharyngeal erythema. No peritonsillar  mass. no blood in the oropharynx. No apparent dental injuries   Neck:   No meningismus. Full ROM. No midline spinal tenderness Hematological/Lymphatic/Immunilogical:   No cervical lymphadenopathy. Cardiovascular:   RRR. Symmetric bilateral radial and DP pulses.  No murmurs.  Respiratory:   Normal respiratory effort without tachypnea/retractions. Breath sounds are clear and equal bilaterally. No wheezes/rales/rhonchi. Gastrointestinal:   Soft and nontender. Non distended. There is no CVA tenderness.  No rebound, rigidity, or guarding. Genitourinary:   deferred Musculoskeletal:   Normal range of motion in all extremities. No joint effusions.  No lower extremity tenderness.  No edema. Neurologic:   Normal speech and language.  Motor grossly intact. normal gait No gross focal neurologic deficits are appreciated.  Skin:    Skin is warm, dry and intact. No rash noted.  No petechiae, purpura, or bullae.  ____________________________________________    LABS (pertinent positives/negatives) (all labs ordered are listed, but only abnormal results are displayed) Labs Reviewed - No data to display ____________________________________________   EKG    ____________________________________________    RADIOLOGY  Ct Head Wo Contrast  Result Date: 11/01/2016 CLINICAL DATA:  71 year old male status post fall in a restaurant parking lot today landing on left side. EXAM: CT HEAD WITHOUT CONTRAST CT MAXILLOFACIAL WITHOUT CONTRAST CT CERVICAL SPINE WITHOUT CONTRAST TECHNIQUE: Multidetector CT imaging of the head, cervical spine, and maxillofacial structures were performed using the standard protocol without intravenous contrast. Multiplanar CT image reconstructions of the cervical spine and maxillofacial structures were also generated. COMPARISON:  CT head and cervical spine 10/01/2013. FINDINGS: CT HEAD FINDINGS Brain: Stable cerebral volume since 2015. Cavum septum pellucidum normal variant. No  midline shift, ventriculomegaly, mass effect, evidence of mass lesion, intracranial hemorrhage or evidence of cortically based acute infarction. Gray-white matter differentiation is within normal limits throughout the brain. Vascular: Chronic Calcified atherosclerosis at the skull base. Skull: Calvarium appears stable and intact. Other: No scalp hematoma identified. CT MAXILLOFACIAL FINDINGS Osseous: Mandible intact. Largely absent dentition. No maxilla fracture identified. No nasal bone or zygoma fracture identified. Central skullbase appears intact. Orbits: Intact orbital walls. No acute orbits soft tissue findings identified. Sinuses: Widespread mucoperiosteal thickening, with similar chronic ethmoid sinus changes evident in 2015. Soft tissues: Negative visualized noncontrast larynx, pharynx, parapharyngeal spaces, retropharyngeal space, sublingual space, submandibular and parotid spaces. No upper cervical lymphadenopathy. CT CERVICAL SPINE FINDINGS Alignment: Chronic anterolisthesis of C2 on C3 is stable since 2015. Superimposed straightening of cervical lordosis. Cervicothoracic junction alignment is within normal limits. Bilateral posterior element alignment is within normal limits. Skull base and vertebrae: Visualized skull base is intact. No atlanto-occipital dissociation. No cervical spine fracture identified. Soft tissues and spinal canal: No prevertebral fluid  or swelling. No visible canal hematoma. Negative for age noncontrast neck soft tissues. Disc levels: Diffuse severe disc space loss with endplate spurring, sparing the C2-C3 level. Moderate to severe C2-C3 facet degeneration on the right. Mild chronic cervical spinal stenosis suspected. Upper chest: Visible upper thoracic levels appear intact. Centrilobular emphysema in the lung apices. IMPRESSION: 1.  No acute traumatic injury identified. 2. Stable and normal for age non contrast CT appearance of the brain. 3. Chronic paranasal sinusitis. 4.  Chronic cervical spine degeneration. Electronically Signed   By: Genevie Ann M.D.   On: 11/01/2016 17:24   Ct Cervical Spine Wo Contrast  Result Date: 11/01/2016 CLINICAL DATA:  71 year old male status post fall in a restaurant parking lot today landing on left side. EXAM: CT HEAD WITHOUT CONTRAST CT MAXILLOFACIAL WITHOUT CONTRAST CT CERVICAL SPINE WITHOUT CONTRAST TECHNIQUE: Multidetector CT imaging of the head, cervical spine, and maxillofacial structures were performed using the standard protocol without intravenous contrast. Multiplanar CT image reconstructions of the cervical spine and maxillofacial structures were also generated. COMPARISON:  CT head and cervical spine 10/01/2013. FINDINGS: CT HEAD FINDINGS Brain: Stable cerebral volume since 2015. Cavum septum pellucidum normal variant. No midline shift, ventriculomegaly, mass effect, evidence of mass lesion, intracranial hemorrhage or evidence of cortically based acute infarction. Gray-white matter differentiation is within normal limits throughout the brain. Vascular: Chronic Calcified atherosclerosis at the skull base. Skull: Calvarium appears stable and intact. Other: No scalp hematoma identified. CT MAXILLOFACIAL FINDINGS Osseous: Mandible intact. Largely absent dentition. No maxilla fracture identified. No nasal bone or zygoma fracture identified. Central skullbase appears intact. Orbits: Intact orbital walls. No acute orbits soft tissue findings identified. Sinuses: Widespread mucoperiosteal thickening, with similar chronic ethmoid sinus changes evident in 2015. Soft tissues: Negative visualized noncontrast larynx, pharynx, parapharyngeal spaces, retropharyngeal space, sublingual space, submandibular and parotid spaces. No upper cervical lymphadenopathy. CT CERVICAL SPINE FINDINGS Alignment: Chronic anterolisthesis of C2 on C3 is stable since 2015. Superimposed straightening of cervical lordosis. Cervicothoracic junction alignment is within normal  limits. Bilateral posterior element alignment is within normal limits. Skull base and vertebrae: Visualized skull base is intact. No atlanto-occipital dissociation. No cervical spine fracture identified. Soft tissues and spinal canal: No prevertebral fluid or swelling. No visible canal hematoma. Negative for age noncontrast neck soft tissues. Disc levels: Diffuse severe disc space loss with endplate spurring, sparing the C2-C3 level. Moderate to severe C2-C3 facet degeneration on the right. Mild chronic cervical spinal stenosis suspected. Upper chest: Visible upper thoracic levels appear intact. Centrilobular emphysema in the lung apices. IMPRESSION: 1.  No acute traumatic injury identified. 2. Stable and normal for age non contrast CT appearance of the brain. 3. Chronic paranasal sinusitis. 4. Chronic cervical spine degeneration. Electronically Signed   By: Genevie Ann M.D.   On: 11/01/2016 17:24   Dg Shoulder Left  Result Date: 11/01/2016 CLINICAL DATA:  Fall with facial impact at [place]. EXAM: LEFT SHOULDER - 2+ VIEW COMPARISON:  None. FINDINGS: There is no evidence of fracture or dislocation. There is no evidence of arthropathy or other focal bone abnormality. Soft tissues are unremarkable. IMPRESSION: Negative. Electronically Signed   By: Ulyses Jarred M.D.   On: 11/01/2016 17:34   Dg Humerus Left  Result Date: 11/01/2016 CLINICAL DATA:  Fall onto the left side at [place]. EXAM: LEFT HUMERUS - 2+ VIEW COMPARISON:  None. FINDINGS: There is no evidence of fracture or other focal bone lesion. Soft tissues are unremarkable. IMPRESSION: Negative. Electronically Signed   By: Lennette Bihari  [place] M.D.   On: 11/01/2016 17:34   Ct Maxillofacial Wo Contrast  Result Date: 11/01/2016 CLINICAL DATA:  71 year old male status post fall in a restaurant parking lot today landing on left side. EXAM: CT HEAD WITHOUT CONTRAST CT MAXILLOFACIAL WITHOUT CONTRAST CT CERVICAL SPINE WITHOUT CONTRAST TECHNIQUE: Multidetector  CT imaging of the head, cervical spine, and maxillofacial structures were performed using the standard protocol without intravenous contrast. Multiplanar CT image reconstructions of the cervical spine and maxillofacial structures were also generated. COMPARISON:  CT head and cervical spine 10/01/2013. FINDINGS: CT HEAD FINDINGS Brain: Stable cerebral volume since 2015. Cavum septum pellucidum normal variant. No midline shift, ventriculomegaly, mass effect, evidence of mass lesion, intracranial hemorrhage or evidence of cortically based acute infarction. Gray-white matter differentiation is within normal limits throughout the brain. Vascular: Chronic Calcified atherosclerosis at the skull base. Skull: Calvarium appears stable and intact. Other: No scalp hematoma identified. CT MAXILLOFACIAL FINDINGS Osseous: Mandible intact. Largely absent dentition. No maxilla fracture identified. No nasal bone or zygoma fracture identified. Central skullbase appears intact. Orbits: Intact orbital walls. No acute orbits soft tissue findings identified. Sinuses: Widespread mucoperiosteal thickening, with similar chronic ethmoid sinus changes evident in 2015. Soft tissues: Negative visualized noncontrast larynx, pharynx, parapharyngeal spaces, retropharyngeal space, sublingual space, submandibular and parotid spaces. No upper cervical lymphadenopathy. CT CERVICAL SPINE FINDINGS Alignment: Chronic anterolisthesis of C2 on C3 is stable since 2015. Superimposed straightening of cervical lordosis. Cervicothoracic junction alignment is within normal limits. Bilateral posterior element alignment is within normal limits. Skull base and vertebrae: Visualized skull base is intact. No atlanto-occipital dissociation. No cervical spine fracture identified. Soft tissues and spinal canal: No prevertebral fluid or swelling. No visible canal hematoma. Negative for age noncontrast neck soft tissues. Disc levels: Diffuse severe disc space loss with  endplate spurring, sparing the C2-C3 level. Moderate to severe C2-C3 facet degeneration on the right. Mild chronic cervical spinal stenosis suspected. Upper chest: Visible upper thoracic levels appear intact. Centrilobular emphysema in the lung apices. IMPRESSION: 1.  No acute traumatic injury identified. 2. Stable and normal for age non contrast CT appearance of the brain. 3. Chronic paranasal sinusitis. 4. Chronic cervical spine degeneration. Electronically Signed   By: Genevie Ann M.D.   On: 11/01/2016 17:24    ____________________________________________   PROCEDURES Procedures  ____________________________________________   INITIAL IMPRESSION / ASSESSMENT AND PLAN / ED COURSE  Pertinent labs & imaging results that were available during my care of the patient were reviewed by me and considered in my medical decision making (see chart for details).  patient well appearing no acute distress, evaluated in the ED with left shoulder pain and a facial injury after a mechanical fall. CT of the head and neck and face is negative. X-ray of the left shoulder and humerus is negative. Patient's  comfortable, exam is reassuring. No evidence of facial fracture. No lacerations requiring repair. Patient reports his tetanus is up-to-date within the last 5 years.suitable for discharge home. Low suspicion for any underlying event that may have caused him to fall such as ACS PE dissection stroke or AAA.      ____________________________________________   FINAL CLINICAL IMPRESSION(S) / ED DIAGNOSES  Final diagnoses:  Minor head injury, initial encounter  Fall, initial encounter  Acute pain of left shoulder      New Prescriptions   No medications on file     Portions of this note were generated with dragon dictation software. Dictation errors may occur despite best attempts at proofreading.  Carrie Mew, MD 11/01/16 1911

## 2016-11-01 NOTE — ED Notes (Signed)
First nurse note  Brought over from [facility] s/p fall having to left shoulder  Abrasion noted to forehead

## 2016-11-07 DIAGNOSIS — Z23 Encounter for immunization: Secondary | ICD-10-CM | POA: Diagnosis not present

## 2016-11-07 DIAGNOSIS — Z7984 Long term (current) use of oral hypoglycemic drugs: Secondary | ICD-10-CM | POA: Diagnosis not present

## 2016-11-07 DIAGNOSIS — R05 Cough: Secondary | ICD-10-CM | POA: Diagnosis not present

## 2016-11-07 DIAGNOSIS — Z87891 Personal history of nicotine dependence: Secondary | ICD-10-CM | POA: Diagnosis not present

## 2016-11-07 DIAGNOSIS — E119 Type 2 diabetes mellitus without complications: Secondary | ICD-10-CM | POA: Diagnosis not present

## 2017-08-05 ENCOUNTER — Encounter: Payer: Self-pay | Admitting: Emergency Medicine

## 2017-08-05 ENCOUNTER — Emergency Department
Admission: EM | Admit: 2017-08-05 | Discharge: 2017-08-05 | Disposition: A | Discharge status: Home / Self Care | Payer: Medicare Other | Attending: Emergency Medicine | Admitting: Emergency Medicine

## 2017-08-05 ENCOUNTER — Emergency Department: Payer: Medicare Other

## 2017-08-05 DIAGNOSIS — S99921A Unspecified injury of right foot, initial encounter: Secondary | ICD-10-CM | POA: Diagnosis present

## 2017-08-05 DIAGNOSIS — Y998 Other external cause status: Secondary | ICD-10-CM | POA: Diagnosis not present

## 2017-08-05 DIAGNOSIS — Y929 Unspecified place or not applicable: Secondary | ICD-10-CM | POA: Diagnosis not present

## 2017-08-05 DIAGNOSIS — Z7982 Long term (current) use of aspirin: Secondary | ICD-10-CM | POA: Insufficient documentation

## 2017-08-05 DIAGNOSIS — Z859 Personal history of malignant neoplasm, unspecified: Secondary | ICD-10-CM | POA: Insufficient documentation

## 2017-08-05 DIAGNOSIS — E119 Type 2 diabetes mellitus without complications: Secondary | ICD-10-CM | POA: Diagnosis not present

## 2017-08-05 DIAGNOSIS — Z79899 Other long term (current) drug therapy: Secondary | ICD-10-CM | POA: Diagnosis not present

## 2017-08-05 DIAGNOSIS — Z87891 Personal history of nicotine dependence: Secondary | ICD-10-CM | POA: Diagnosis not present

## 2017-08-05 DIAGNOSIS — Y9389 Activity, other specified: Secondary | ICD-10-CM | POA: Insufficient documentation

## 2017-08-05 DIAGNOSIS — W231XXA Caught, crushed, jammed, or pinched between stationary objects, initial encounter: Secondary | ICD-10-CM | POA: Diagnosis not present

## 2017-08-05 DIAGNOSIS — S92514A Nondisplaced fracture of proximal phalanx of right lesser toe(s), initial encounter for closed fracture: Secondary | ICD-10-CM | POA: Diagnosis not present

## 2017-08-05 DIAGNOSIS — Z7984 Long term (current) use of oral hypoglycemic drugs: Secondary | ICD-10-CM | POA: Insufficient documentation

## 2017-08-05 DIAGNOSIS — I10 Essential (primary) hypertension: Secondary | ICD-10-CM | POA: Insufficient documentation

## 2017-08-05 MED ORDER — TRAMADOL HCL 50 MG PO TABS: 50.0000 mg | ORAL_TABLET | Freq: Four times a day (QID) | ORAL | 0 refills | Status: AC | PRN

## 2017-08-05 NOTE — ED Triage Notes (Signed)
Pt reports he dropped a piece of a tractor motor onto the 2nd toe on right foot. pts toe is black and bruised. Pt has DM with limited feeling in foot.

## 2017-08-05 NOTE — ED Provider Notes (Signed)
[facility] Emergency Department Provider Note  ____________________________________________  Time seen: Approximately 10:27 PM  I have reviewed the triage vital signs and the nursing notes.   HISTORY  Chief Complaint Toe Injury    HPI Joe Coleman is a 72 y.o. male presents to the emergency department with 7 out of 10 right foot pain of the right second toe after patient dropped a tractor wheel on his foot.  Patient has been able to move all 5 right toes since incident occurred.  He has noticed some ecchymosis along proximal aspect of right second toe.  No weakness or changes in sensation of the lower extremities.  No alleviating measures have been attempted.   Past Medical History:  Diagnosis Date  . Cancer ([place])   . Diabetes mellitus without complication ([place])   . Hypertension     There are no active problems to display for this patient.   Past Surgical History:  Procedure Laterality Date  . APPENDECTOMY    . COLON SURGERY      Prior to Admission medications   Medication Sig Start Date End Date Taking? Authorizing Provider  albuterol (PROVENTIL HFA;VENTOLIN HFA) 108 (90 Base) MCG/ACT inhaler Inhale 2 puffs into the lungs every 6 (six) hours as needed for wheezing or shortness of breath. 04/01/16   Merlyn Lot, MD  albuterol (PROVENTIL HFA;VENTOLIN HFA) 108 (90 Base) MCG/ACT inhaler Inhale 2 puffs into the lungs every 6 (six) hours as needed for wheezing or shortness of breath. 04/17/16   Loney Hering, MD  aspirin EC 81 MG tablet Take 81 mg by mouth daily.    [provider]  cephALEXin (KEFLEX) 500 MG capsule Take 1 capsule (500 mg total) by mouth 4 (four) times daily. 09/15/16   Cuthriell, Charline Bills, PA-C  gabapentin (NEURONTIN) 300 MG capsule Take 300 mg by mouth at bedtime.    [provider]  losartan (COZAAR) 25 MG tablet Take 1 tablet by mouth daily. 03/20/16   [provider]  metFORMIN (GLUCOPHAGE) 500  MG tablet Take by mouth 2 (two) times daily with a meal.    [provider]  predniSONE (DELTASONE) 20 MG tablet Take 3 tablets (60 mg total) by mouth daily. 04/17/16   Loney Hering, MD  Spacer/Aero-Holding Chambers (AEROCHAMBER MV) inhaler Use as instructed 04/17/16   Loney Hering, MD  traMADol (ULTRAM) 50 MG tablet Take 1 tablet (50 mg total) by mouth every 6 (six) hours as needed for up to 3 days. 08/05/17 08/08/17  Lannie Fields, PA-C    Allergies Poultry meal  History reviewed. No pertinent family history.  Social History Social History   Tobacco Use  . Smoking status: Former Research scientist (life sciences)  . Smokeless tobacco: Never Used  Substance Use Topics  . Alcohol use: No  . Drug use: No     Review of Systems  Constitutional: No fever/chills Eyes: No visual changes. No discharge ENT: No upper respiratory complaints. Cardiovascular: no chest pain. Respiratory: no cough. No SOB. Gastrointestinal: No abdominal pain.  No nausea, no vomiting.  No diarrhea.  No constipation. Musculoskeletal: Patient has right second toe pain.  Skin: Negative for rash, abrasions, lacerations, ecchymosis. Neurological: Negative for headaches, focal weakness or numbness.   ____________________________________________   PHYSICAL EXAM:  VITAL SIGNS: ED Triage Vitals [08/05/17 2036]  Enc Vitals Group     BP (!) 154/86     Pulse Rate 72     Resp 17     Temp 98.2  F (36.8 C)     Temp Source Oral     SpO2 99 %     Weight 155 lb (70.3 kg)     Height      Head Circumference      Peak Flow      Pain Score      Pain Loc      Pain Edu?      Excl. in [place]?      Constitutional: Alert and oriented. Well appearing and in no acute distress. Eyes: Conjunctivae are normal. PERRL. EOMI. Head: Atraumatic. Cardiovascular: Normal rate, regular rhythm. Normal S1 and S2.  Good peripheral circulation. Respiratory: Normal respiratory effort without tachypnea or retractions. Lungs CTAB. Good air entry  to the bases with no decreased or absent breath sounds. Musculoskeletal: Patient is able to perform full range of motion at the right ankle.  Patient is able to move all 5 toes.  No pain was elicited with palpation over the metatarsals.  Ecchymosis is noted at the proximal aspect of the right second toe.  Palpable dorsalis pedis pulse, right. Neurologic:  Normal speech and language. No gross focal neurologic deficits are appreciated.  Skin:  Skin is warm, dry and intact. No rash noted. Psychiatric: Mood and affect are normal. Speech and behavior are normal. Patient exhibits appropriate insight and judgement.   ____________________________________________   LABS (all labs ordered are listed, but only abnormal results are displayed)  Labs Reviewed - No data to display ____________________________________________  EKG   ____________________________________________  RADIOLOGY I personally viewed and evaluated these images as part of my medical decision making, as well as reviewing the written report by the radiologist.  Dg Foot Complete Right  Result Date: 08/05/2017 CLINICAL DATA:  The patient reports he dropped a piece of a tractor motor onto the 2nd toe on right foot. The toe is black and bruised. History of diabetes with limited feeling in foot. EXAM: RIGHT FOOT COMPLETE - 3+ VIEW COMPARISON:  None. FINDINGS: There is slight cortical irregularity along the midshaft proximal phalanx of the right second toe. This is only demonstrated on the AP view. This may represent a nondisplaced fracture. No articular involvement. No dislocation. No other focal bone lesions. Soft tissues are unremarkable. IMPRESSION: Cortical irregularity along the midshaft proximal phalanx right second toe may indicate a nondisplaced fracture. Electronically Signed   By: Lucienne Capers M.D.   On: 08/05/2017 21:22    ____________________________________________    PROCEDURES  Procedure(s) performed:     Procedures    Medications - No data to display   ____________________________________________   INITIAL IMPRESSION / ASSESSMENT AND PLAN / ED COURSE  Pertinent labs & imaging results that were available during my care of the patient were reviewed by me and considered in my medical decision making (see chart for details).  Review of the [place] CSRS was performed in accordance of the [place] prior to dispensing any controlled drugs.      Assessment and plan Toe pain Patient presents to the emergency department with right second toe pain after patient dropped a tractor wheel on foot.  X-ray examination is concerning for a nondisplaced proximal phalanx fracture.  Patient underwent buddy taping in the emergency department and tramadol was prescribed for pain at discharge. He was advised to follow-up with podiatry as needed.   ____________________________________________  FINAL CLINICAL IMPRESSION(S) / ED DIAGNOSES  Final diagnoses:  Closed nondisplaced fracture of proximal phalanx of lesser toe of right foot, initial encounter  NEW MEDICATIONS STARTED DURING THIS VISIT:  ED Discharge Orders        Ordered    traMADol (ULTRAM) 50 MG tablet  Every 6 hours PRN     08/05/17 2225          This chart was dictated using voice recognition software/Dragon. Despite best efforts to proofread, errors can occur which can change the meaning. Any change was purely unintentional.    Lannie Fields, PA-C 08/05/17 2231    Orbie Pyo, MD 08/05/17 2312

## 2017-09-19 DIAGNOSIS — J4520 Mild intermittent asthma, uncomplicated: Secondary | ICD-10-CM | POA: Insufficient documentation

## 2017-09-19 DIAGNOSIS — N1832 Chronic kidney disease, stage 3b: Secondary | ICD-10-CM | POA: Insufficient documentation

## 2017-09-19 DIAGNOSIS — N183 Chronic kidney disease, stage 3 unspecified: Secondary | ICD-10-CM | POA: Insufficient documentation

## 2017-11-10 DIAGNOSIS — N529 Male erectile dysfunction, unspecified: Secondary | ICD-10-CM | POA: Insufficient documentation

## 2018-04-01 ENCOUNTER — Emergency Department
Admission: EM | Admit: 2018-04-01 | Discharge: 2018-04-01 | Disposition: A | Discharge status: Home / Self Care | Payer: Medicare Other | Attending: Emergency Medicine | Admitting: Emergency Medicine

## 2018-04-01 ENCOUNTER — Other Ambulatory Visit: Payer: Self-pay

## 2018-04-01 ENCOUNTER — Emergency Department: Payer: Medicare Other

## 2018-04-01 ENCOUNTER — Encounter: Payer: Self-pay | Admitting: Emergency Medicine

## 2018-04-01 DIAGNOSIS — I10 Essential (primary) hypertension: Secondary | ICD-10-CM | POA: Diagnosis not present

## 2018-04-01 DIAGNOSIS — T07XXXA Unspecified multiple injuries, initial encounter: Secondary | ICD-10-CM

## 2018-04-01 DIAGNOSIS — Y929 Unspecified place or not applicable: Secondary | ICD-10-CM | POA: Diagnosis not present

## 2018-04-01 DIAGNOSIS — Z79899 Other long term (current) drug therapy: Secondary | ICD-10-CM | POA: Insufficient documentation

## 2018-04-01 DIAGNOSIS — Y9389 Activity, other specified: Secondary | ICD-10-CM | POA: Insufficient documentation

## 2018-04-01 DIAGNOSIS — E119 Type 2 diabetes mellitus without complications: Secondary | ICD-10-CM | POA: Insufficient documentation

## 2018-04-01 DIAGNOSIS — Z87891 Personal history of nicotine dependence: Secondary | ICD-10-CM | POA: Diagnosis not present

## 2018-04-01 DIAGNOSIS — W11XXXA Fall on and from ladder, initial encounter: Secondary | ICD-10-CM | POA: Diagnosis not present

## 2018-04-01 DIAGNOSIS — Y999 Unspecified external cause status: Secondary | ICD-10-CM | POA: Diagnosis not present

## 2018-04-01 DIAGNOSIS — S0990XA Unspecified injury of head, initial encounter: Secondary | ICD-10-CM | POA: Insufficient documentation

## 2018-04-01 DIAGNOSIS — Z7984 Long term (current) use of oral hypoglycemic drugs: Secondary | ICD-10-CM | POA: Diagnosis not present

## 2018-04-01 DIAGNOSIS — W19XXXA Unspecified fall, initial encounter: Secondary | ICD-10-CM

## 2018-04-01 DIAGNOSIS — Z7982 Long term (current) use of aspirin: Secondary | ICD-10-CM | POA: Diagnosis not present

## 2018-04-01 IMAGING — CT CT HEAD W/O CM
5 of 7 series · 17 of 47 positions shown, 18 images · non-contrast
Comparison: None.

CLINICAL DATA: Fall from ladder

EXAM:
CT HEAD WITHOUT CONTRAST
CT CERVICAL SPINE WITHOUT CONTRAST
TECHNIQUE: Multidetector CT imaging of the head and cervical spine was
performed following the standard protocol without intravenous
contrast. Multiplanar CT image reconstructions of the cervical spine
were also generated.

[Series 2: head wo · axial · 0.40mm/px · z∈[+500,+545]mm · 2 of 28 slices shown, 3 images]
[im 10/28  brain]
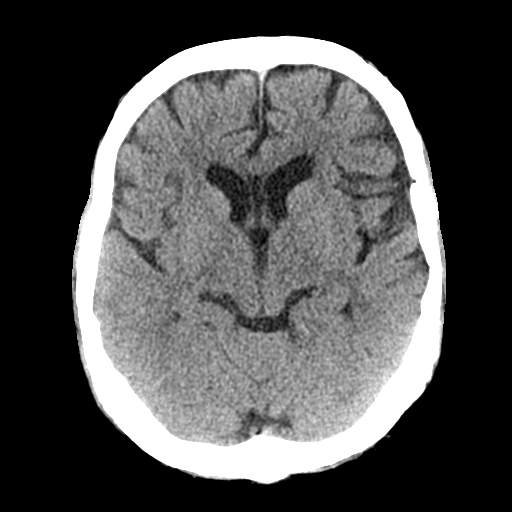
[im 10/28  bone]
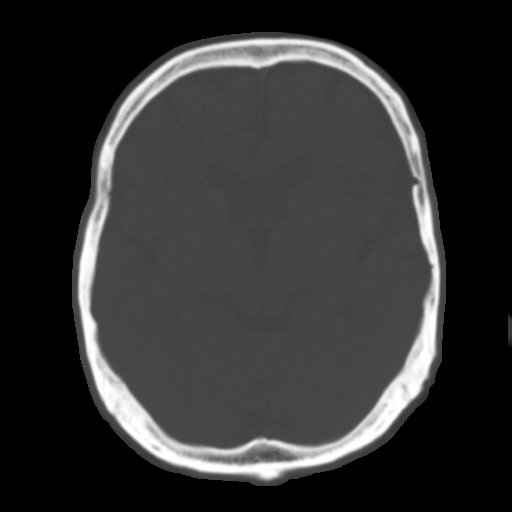
[im 19/28  brain]
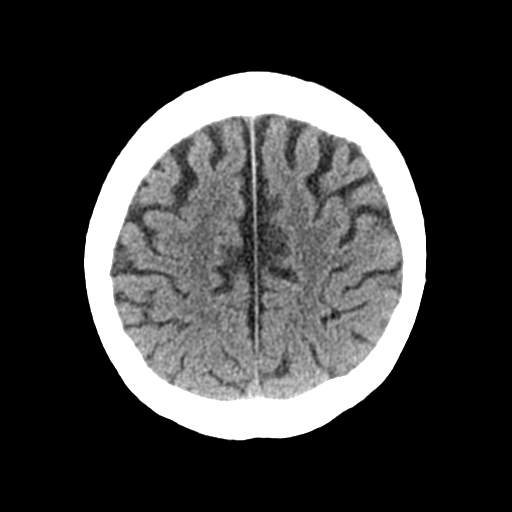

[Series 4: coronal soft tissue · coronal · 0.27mm/px · 2 of 62 slices shown]
[im 9/62  brain]
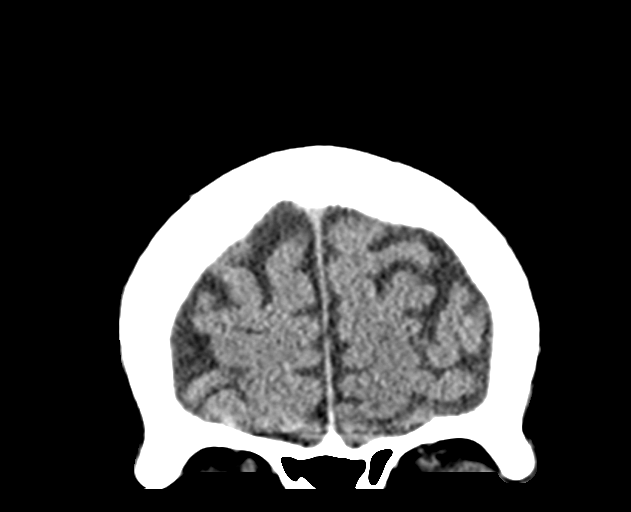
[im 17/62  brain]
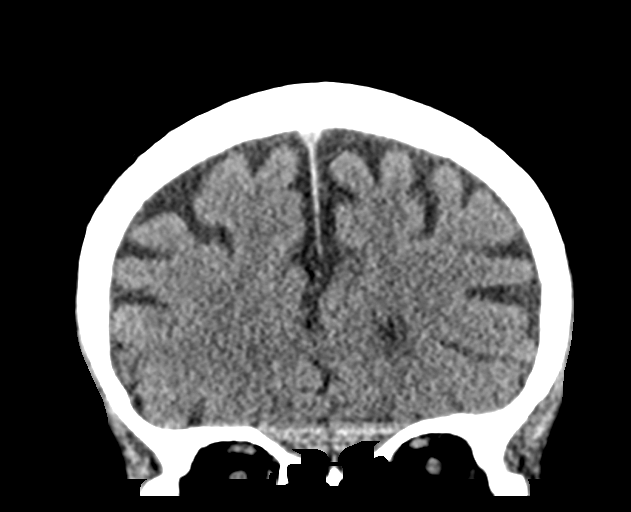

[Series 5: sagittal soft tissue · sagittal · 0.27mm/px · 1 of 53 slices shown]
[im 27/53  brain]
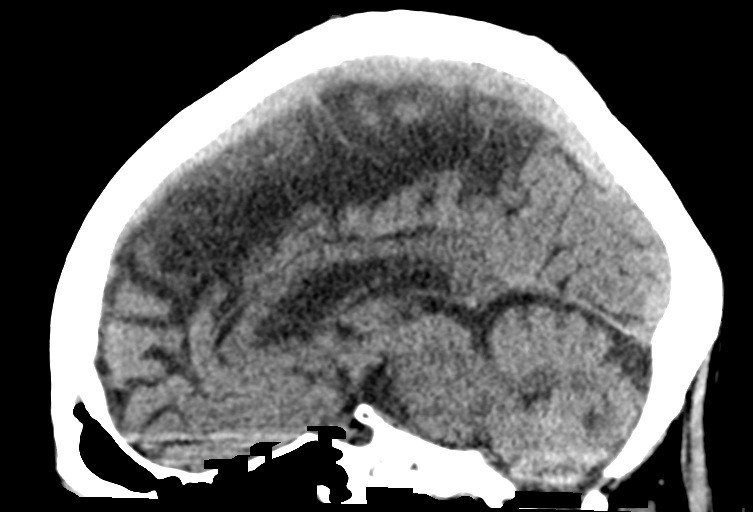

[Series 7: c spine soft · axial · 0.29mm/px · z∈[+341,+397]mm · 4 of 87 slices shown]
[im 8/87  brain]
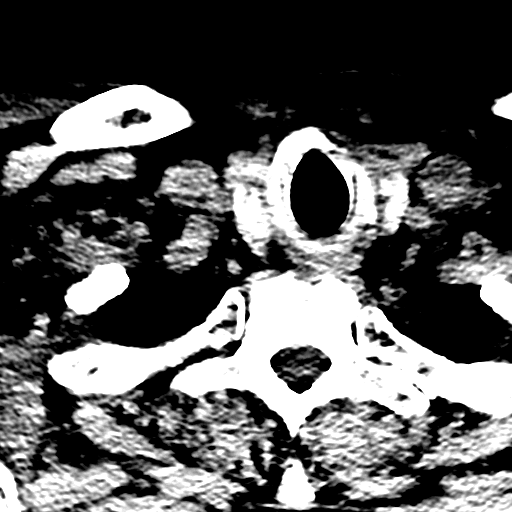
[im 22/87  brain]
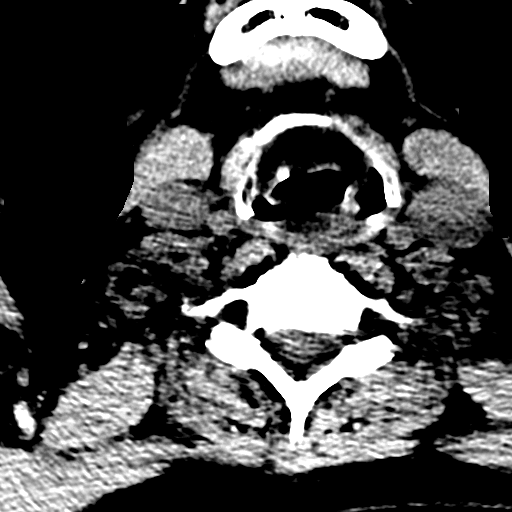
[im 29/87  brain]
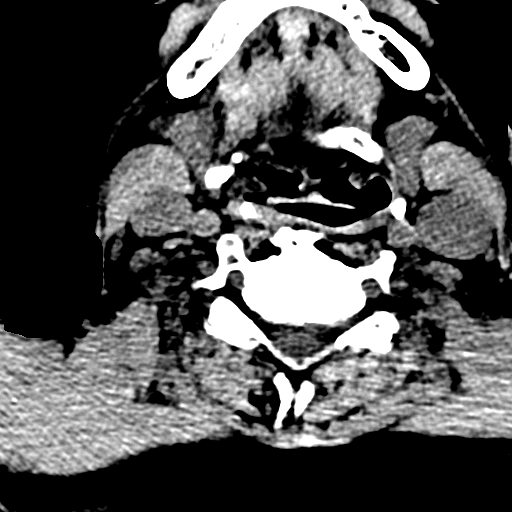
[im 36/87  brain]
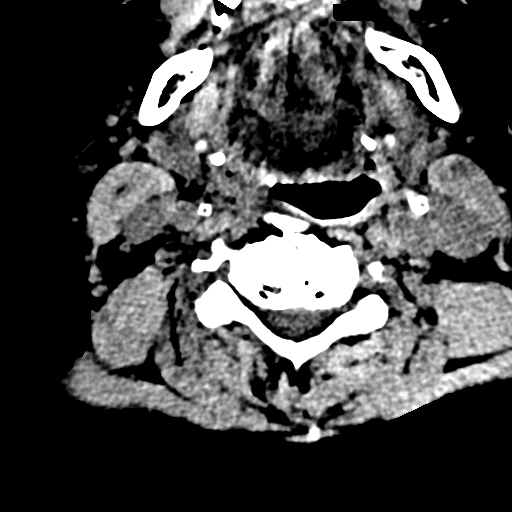

[Series 10: orthogonal bone · axial · 0.22mm/px · z∈[+324,+465]mm · 8 of 92 slices shown]
[im 8/92  bone]
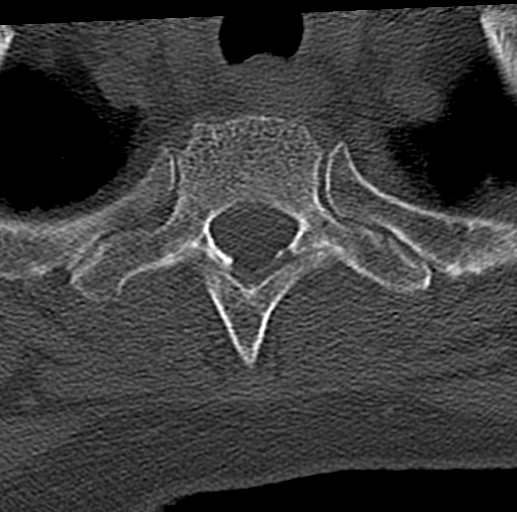
[im 23/92  bone]
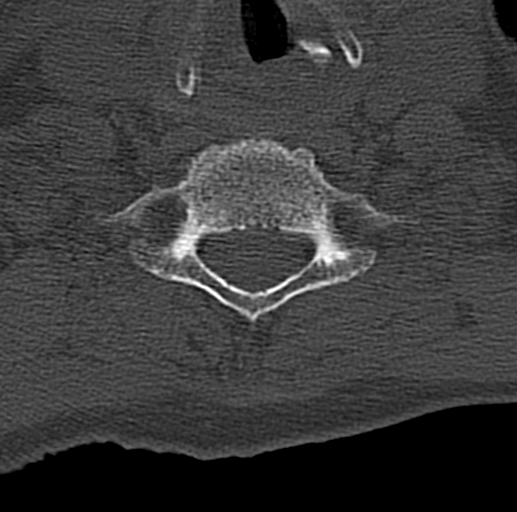
[im 31/92  bone]
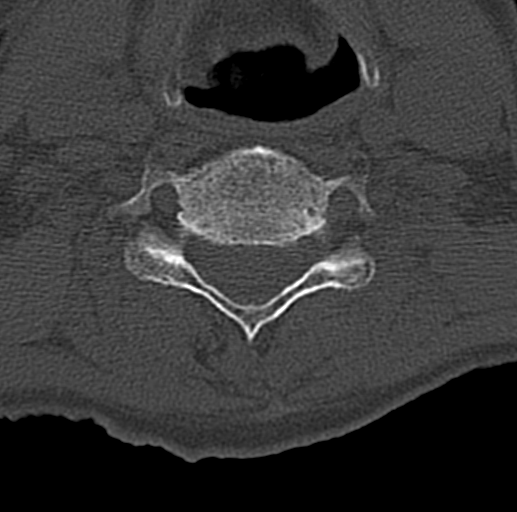
[im 38/92  bone]
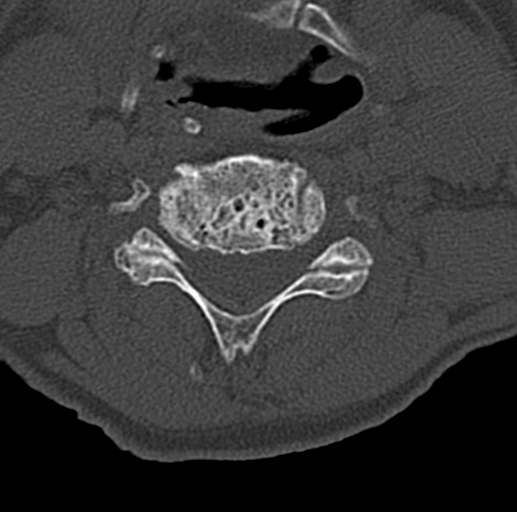
[im 54/92  bone]
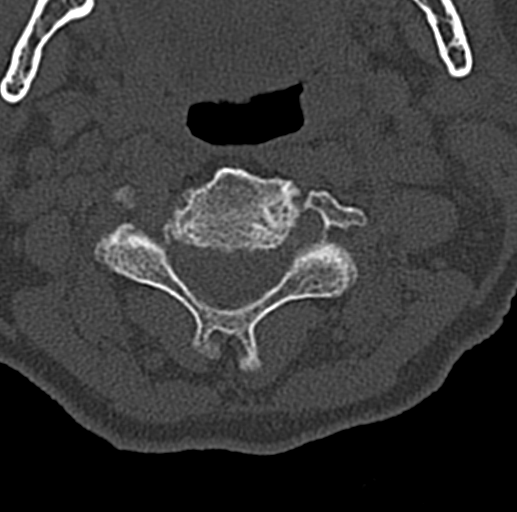
[im 61/92  bone]
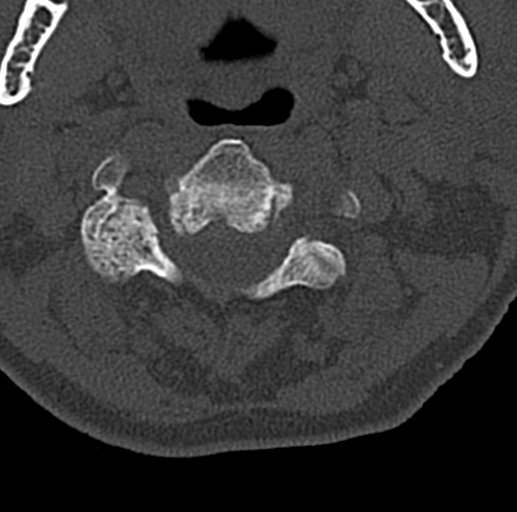
[im 69/92  bone]
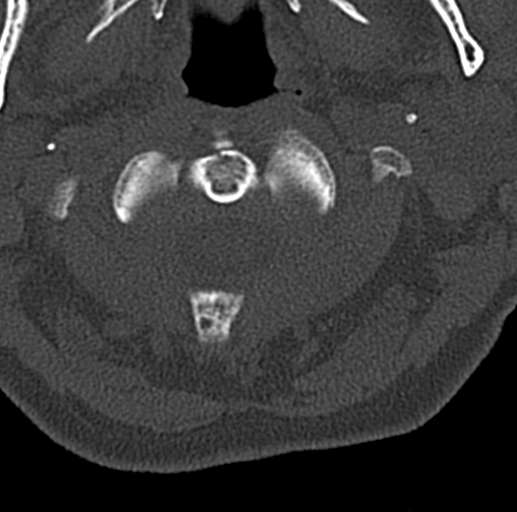
[im 84/92  bone]
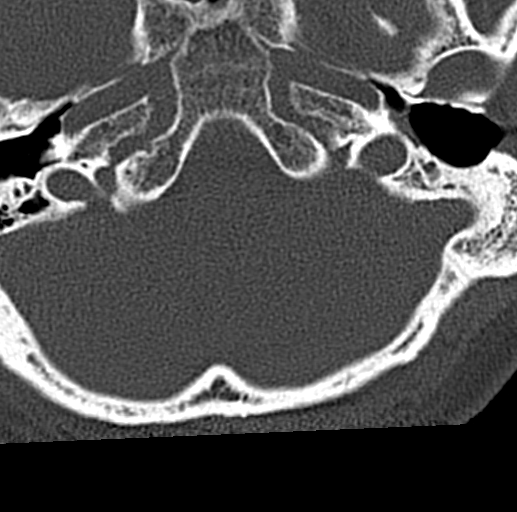

[17 of 47 positions shown; findings below may reference images not displayed]

FINDINGS: CT HEAD FINDINGS

Brain: There is no mass, hemorrhage or extra-axial collection. The
size and configuration of the ventricles and extra-axial CSF spaces
are normal. The brain parenchyma is normal, without evidence of
acute or chronic infarction.

Vascular: No abnormal hyperdensity of the major intracranial
arteries or dural venous sinuses. No intracranial atherosclerosis.

Skull: The visualized skull base, calvarium and extracranial soft
tissues are normal.

Sinuses/Orbits: No fluid levels or advanced mucosal thickening of
the visualized paranasal sinuses. No mastoid or middle ear effusion.
The orbits are normal.

CT CERVICAL SPINE FINDINGS

Alignment: Grade 1 C2-3 anterolisthesis secondary to severe right
facet hypertrophy. Alignment otherwise normal.

Skull base and vertebrae: No acute fracture.

Soft tissues and spinal canal: No prevertebral fluid or swelling. No
visible canal hematoma.

Disc levels: Multilevel disc space narrowing no bony spinal canal
stenosis.

Upper chest: No pneumothorax, pulmonary nodule or pleural effusion.

Other: Normal visualized paraspinal cervical soft tissues.
IMPRESSION: 1. No acute intracranial abnormality.
2. No acute fracture of the cervical spine.

## 2018-04-01 NOTE — ED Provider Notes (Signed)
[facility] Emergency Department Provider Note  ____________________________________________   First MD Initiated Contact with Patient 04/01/18 1853     (approximate)  I have reviewed the triage vital signs and the nursing notes.   HISTORY  Chief Complaint Fall    HPI Joe Coleman is a 73 y.o. male presents emergency department after falling approximately 10 feet from a ladder.  He states he was on the ladder trying to get 10 off of a shed and reach back to grab wood and the whole ladder and he fell.  States he landed on his butt.  He landed on concrete.  States his neck and his head do hurt a little bit.  He did not lose consciousness.  Has had no nausea or vomiting since incident.  He was able to ambulate after the incident.  He states he hurts along the right side of the chest.    Past Medical History:  Diagnosis Date  . Cancer ([place])   . Diabetes mellitus without complication ([place])   . Hypertension     There are no active problems to display for this patient.   Past Surgical History:  Procedure Laterality Date  . APPENDECTOMY    . COLON SURGERY      Prior to Admission medications   Medication Sig Start Date End Date Taking? Authorizing Provider  albuterol (PROVENTIL HFA;VENTOLIN HFA) 108 (90 Base) MCG/ACT inhaler Inhale 2 puffs into the lungs every 6 (six) hours as needed for wheezing or shortness of breath. 04/01/16   Merlyn Lot, MD  albuterol (PROVENTIL HFA;VENTOLIN HFA) 108 (90 Base) MCG/ACT inhaler Inhale 2 puffs into the lungs every 6 (six) hours as needed for wheezing or shortness of breath. 04/17/16   Loney Hering, MD  aspirin EC 81 MG tablet Take 81 mg by mouth daily.    [provider]  gabapentin (NEURONTIN) 300 MG capsule Take 300 mg by mouth at bedtime.    [provider]  losartan (COZAAR) 25 MG tablet Take 1 tablet by mouth daily. 03/20/16   [provider]  metFORMIN (GLUCOPHAGE) 500 MG  tablet Take by mouth 2 (two) times daily with a meal.    [provider]  Spacer/Aero-Holding Chambers (AEROCHAMBER MV) inhaler Use as instructed 04/17/16   Loney Hering, MD    Allergies Poultry meal  History reviewed. No pertinent family history.  Social History Social History   Tobacco Use  . Smoking status: Former Research scientist (life sciences)  . Smokeless tobacco: Never Used  Substance Use Topics  . Alcohol use: No  . Drug use: No    Review of Systems  Constitutional: No fever/chills Eyes: No visual changes. ENT: No sore throat. Respiratory: Denies cough Genitourinary: Negative for dysuria. Musculoskeletal: Positive for neck and lower back pain, positive for right-sided chest pain Skin: Negative for rash.    ____________________________________________   PHYSICAL EXAM:  VITAL SIGNS: ED Triage Vitals  Enc Vitals Group     BP 04/01/18 1831 (!) 145/73     Pulse Rate 04/01/18 1831 82     Resp 04/01/18 1831 18     Temp 04/01/18 1831 97.8 F (36.6 C)     Temp Source 04/01/18 1831 Oral     SpO2 04/01/18 1831 100 %     Weight 04/01/18 1831 165 lb (74.8 kg)     Height 04/01/18 1831 5' 8.5" (1.74 m)     Head Circumference --      Peak Flow --  Pain Score 04/01/18 1833 10     Pain Loc --      Pain Edu? --      Excl. in [place]? --     Constitutional: Alert and oriented. Well appearing and in no acute distress. Eyes: Conjunctivae are normal.  Head: Atraumatic. Nose: No congestion/rhinnorhea. Mouth/Throat: Mucous membranes are moist.   Neck:  supple no lymphadenopathy noted Cardiovascular: Normal rate, regular rhythm. Heart sounds are normal Respiratory: Normal respiratory effort.  No retractions, lungs c t a  GU: deferred Musculoskeletal: FROM all extremities, warm and well perfused, C-spine and lumbar spine are tender, right side ribs are tender.  Neurovascular is intact. Neurologic:  Normal speech and language.  Skin:  Skin is warm, dry and intact. No rash  noted. Psychiatric: Mood and affect are normal. Speech and behavior are normal.  ____________________________________________   LABS (all labs ordered are listed, but only abnormal results are displayed)  Labs Reviewed - No data to display ____________________________________________   ____________________________________________  RADIOLOGY  X-ray of the chest and right hip were ordered from triage which are both negative CT of the head, C-spine, and lumbar spine were ordered CT of the head, C-spine, and lumbar spine are negative for any acute abnormalities ____________________________________________   PROCEDURES  Procedure(s) performed: No  Procedures    ____________________________________________   INITIAL IMPRESSION / ASSESSMENT AND PLAN / ED COURSE  Pertinent labs & imaging results that were available during my care of the patient were reviewed by me and considered in my medical decision making (see chart for details).   Patient is 73 year old male presents emergency department after falling off a ladder 10 feet high in the air.  He states he landed on his butt.  He is complaining of right-sided chest pain along the ribs, low back pain, neck pain, and a slight headache.  Physical exam shows patient to appear well.  C-spine and lumbar spine are tender to palpation.  He is able to ambulate without difficulty.  Right ribs are tender to palpation.  The remainder the exam is unremarkable  ddx includes head injury, fractured spine, fractured hip, contusion  CT of the head, C-spine, and lumbar spine are all negative for any acute abnormality X-ray of the chest and right hip that were ordered from triage are both negative  Explained all the x-ray findings to the patient.  He is to take over-the-counter pain medication.  He also has gabapentin at home that he can take.  He is to apply ice to all areas that hurt.  I advised him to remain out of work tomorrow.  He states he  understands will comply.  He was discharged in stable condition.     As part of my medical decision making, I reviewed the following data within the [place] notes reviewed and incorporated, Old chart reviewed, Radiograph reviewed see above, Notes from prior ED visits and [place] Controlled Substance Database  ____________________________________________   FINAL CLINICAL IMPRESSION(S) / ED DIAGNOSES  Final diagnoses:  Fall, initial encounter  Multiple contusions      NEW MEDICATIONS STARTED DURING THIS VISIT:  New Prescriptions   No medications on file     Note:  This document was prepared using Dragon voice recognition software and may include unintentional dictation errors.    Versie Starks, PA-C 04/01/18 2013    Alfred Levins, [place], MD 04/06/18 [phone]

## 2018-04-01 NOTE — ED Notes (Signed)
Presents s/p fall  States he fell approx 10 ft from ladder    He was trying to pry off some tin  Slipped and fell back  Landed on right hip and right shoulder

## 2018-04-01 NOTE — Discharge Instructions (Addendum)
Follow-up with your regular doctor if not better in 5 to 7 days.  Take Tylenol and ibuprofen for pain as needed.  If you are worsening return to the emergency department.  Apply ice to all areas that hurt.

## 2019-04-26 ENCOUNTER — Ambulatory Visit: Payer: Medicare Other

## 2019-10-05 DIAGNOSIS — E538 Deficiency of other specified B group vitamins: Secondary | ICD-10-CM | POA: Insufficient documentation

## 2019-10-30 ENCOUNTER — Other Ambulatory Visit: Payer: Self-pay

## 2019-10-30 ENCOUNTER — Emergency Department
Admission: EM | Admit: 2019-10-30 | Discharge: 2019-10-31 | Disposition: A | Discharge status: Home / Self Care | Payer: Medicare Other | Attending: Emergency Medicine | Admitting: Emergency Medicine

## 2019-10-30 DIAGNOSIS — Z5321 Procedure and treatment not carried out due to patient leaving prior to being seen by health care provider: Secondary | ICD-10-CM | POA: Diagnosis not present

## 2019-10-30 DIAGNOSIS — S0086XA Insect bite (nonvenomous) of other part of head, initial encounter: Secondary | ICD-10-CM | POA: Diagnosis present

## 2019-10-30 DIAGNOSIS — W57XXXA Bitten or stung by nonvenomous insect and other nonvenomous arthropods, initial encounter: Secondary | ICD-10-CM | POA: Diagnosis not present

## 2019-10-30 NOTE — ED Notes (Signed)
Pt in hallway frustrated about wait time to be seen by PA. Pt assured that he will be seen. Pt threatens to leave if not seen soon

## 2019-10-30 NOTE — ED Notes (Signed)
Pt reports at approx 1500 today he was bit on the right side of his neck under his jaw by a "brown or black spider"  Pt reports he had just come in from outside and saw it in the mirror. Pt reports burning sensation which has increased. Area visuallized, mild redness, no swelling or rash. Pt denies any difficulty breathing. Pt with cough, states it is normal for him

## 2019-10-30 NOTE — ED Triage Notes (Signed)
Patient states that he was bite by a spider under his chin about an hour ago. Patient states that it itches and burns.

## 2019-10-31 NOTE — ED Notes (Signed)
Pt left. 

## 2020-03-17 ENCOUNTER — Emergency Department: Payer: Medicare Other

## 2020-03-17 ENCOUNTER — Observation Stay
Admission: EM | Admit: 2020-03-17 | Discharge: 2020-03-20 | Disposition: A | Discharge status: Home / Self Care | Payer: Medicare Other | Attending: Internal Medicine | Admitting: Internal Medicine

## 2020-03-17 ENCOUNTER — Other Ambulatory Visit: Payer: Self-pay

## 2020-03-17 DIAGNOSIS — Z85028 Personal history of other malignant neoplasm of stomach: Secondary | ICD-10-CM | POA: Diagnosis not present

## 2020-03-17 DIAGNOSIS — J441 Chronic obstructive pulmonary disease with (acute) exacerbation: Secondary | ICD-10-CM | POA: Diagnosis not present

## 2020-03-17 DIAGNOSIS — U071 COVID-19: Secondary | ICD-10-CM | POA: Diagnosis not present

## 2020-03-17 DIAGNOSIS — Z87891 Personal history of nicotine dependence: Secondary | ICD-10-CM | POA: Diagnosis not present

## 2020-03-17 DIAGNOSIS — R0602 Shortness of breath: Secondary | ICD-10-CM | POA: Diagnosis present

## 2020-03-17 DIAGNOSIS — Z7982 Long term (current) use of aspirin: Secondary | ICD-10-CM | POA: Diagnosis not present

## 2020-03-17 DIAGNOSIS — Z85038 Personal history of other malignant neoplasm of large intestine: Secondary | ICD-10-CM | POA: Insufficient documentation

## 2020-03-17 DIAGNOSIS — E119 Type 2 diabetes mellitus without complications: Secondary | ICD-10-CM | POA: Diagnosis not present

## 2020-03-17 DIAGNOSIS — Z79899 Other long term (current) drug therapy: Secondary | ICD-10-CM | POA: Insufficient documentation

## 2020-03-17 DIAGNOSIS — Z7984 Long term (current) use of oral hypoglycemic drugs: Secondary | ICD-10-CM | POA: Diagnosis not present

## 2020-03-17 DIAGNOSIS — N179 Acute kidney failure, unspecified: Secondary | ICD-10-CM | POA: Diagnosis not present

## 2020-03-17 DIAGNOSIS — I10 Essential (primary) hypertension: Secondary | ICD-10-CM | POA: Diagnosis not present

## 2020-03-17 DIAGNOSIS — E1169 Type 2 diabetes mellitus with other specified complication: Secondary | ICD-10-CM

## 2020-03-17 LAB — BASIC METABOLIC PANEL
Anion gap: 7 (ref 5–15)
BUN: 29 mg/dL — ABNORMAL HIGH (ref 8–23)
CO2: 25 mmol/L (ref 22–32)
Calcium: 8.6 mg/dL — ABNORMAL LOW (ref 8.9–10.3)
Chloride: 104 mmol/L (ref 98–111)
Creatinine, Ser: 1.52 mg/dL — ABNORMAL HIGH (ref 0.61–1.24)
GFR, Estimated: 47 mL/min — ABNORMAL LOW (ref >60)
Glucose, Bld: 131 mg/dL — ABNORMAL HIGH (ref 70–99)
Potassium: 4.6 mmol/L (ref 3.5–5.1)
Sodium: 136 mmol/L (ref 135–145)

## 2020-03-17 LAB — GLUCOSE, CAPILLARY: Glucose-Capillary: 329 mg/dL — ABNORMAL HIGH (ref 70–99)

## 2020-03-17 LAB — CBC
HCT: 38.1 % — ABNORMAL LOW (ref 39.0–52.0)
Hemoglobin: 11.7 g/dL — ABNORMAL LOW (ref 13.0–17.0)
MCH: 26.6 pg (ref 26.0–34.0)
MCHC: 30.7 g/dL (ref 30.0–36.0)
MCV: 86.6 fL (ref 80.0–100.0)
Platelets: 183 10*3/uL (ref 150–400)
RBC: 4.4 MIL/uL (ref 4.22–5.81)
RDW: 14 % (ref 11.5–15.5)
WBC: 4.3 10*3/uL (ref 4.0–10.5)
nRBC: 0 % (ref 0.0–0.2)

## 2020-03-17 LAB — TROPONIN I (HIGH SENSITIVITY)
Troponin I (High Sensitivity): 5 ng/L (ref <18)
Troponin I (High Sensitivity): 5 ng/L (ref <18)

## 2020-03-17 LAB — FIBRIN DERIVATIVES D-DIMER (ARMC ONLY): Fibrin derivatives D-dimer (ARMC): 875.03 ng/mL (FEU) — ABNORMAL HIGH (ref 0.00–499.00)

## 2020-03-17 MED ORDER — IPRATROPIUM-ALBUTEROL 0.5-2.5 (3) MG/3ML IN SOLN
3.0000 mL | Freq: Once | RESPIRATORY_TRACT | Status: AC
  Administered 2020-03-17: 3 mL via RESPIRATORY_TRACT
  Filled 2020-03-17: qty 3

## 2020-03-17 MED ORDER — IPRATROPIUM-ALBUTEROL 0.5-2.5 (3) MG/3ML IN SOLN
3.0000 mL | Freq: Four times a day (QID) | RESPIRATORY_TRACT | Status: DC
  Administered 2020-03-17: 3 mL via RESPIRATORY_TRACT
  Filled 2020-03-17: qty 3

## 2020-03-17 MED ORDER — METHYLPREDNISOLONE SODIUM SUCC 125 MG IJ SOLR
125.0000 mg | Freq: Once | INTRAMUSCULAR | Status: AC
  Administered 2020-03-17: 125 mg via INTRAVENOUS
  Filled 2020-03-17: qty 2

## 2020-03-17 MED ORDER — ASPIRIN EC 81 MG PO TBEC
81.0000 mg | DELAYED_RELEASE_TABLET | Freq: Every day | ORAL | Status: DC
  Administered 2020-03-18 – 2020-03-20 (×3): 81 mg via ORAL
  Filled 2020-03-17 (×3): qty 1

## 2020-03-17 MED ORDER — SODIUM CHLORIDE 0.9 % IV BOLUS
1000.0000 mL | Freq: Once | INTRAVENOUS | Status: AC
  Administered 2020-03-17: 1000 mL via INTRAVENOUS

## 2020-03-17 MED ORDER — SODIUM CHLORIDE 0.9 % IV SOLN: INTRAVENOUS | Status: AC

## 2020-03-17 MED ORDER — PREDNISONE 20 MG PO TABS
40.0000 mg | ORAL_TABLET | Freq: Every day | ORAL | Status: DC
  Administered 2020-03-18 – 2020-03-20 (×3): 40 mg via ORAL
  Filled 2020-03-17 (×2): qty 2

## 2020-03-17 MED ORDER — POLYETHYLENE GLYCOL 3350 17 G PO PACK
17.0000 g | PACK | Freq: Every day | ORAL | Status: DC | PRN
  Filled 2020-03-17: qty 1

## 2020-03-17 MED ORDER — IOHEXOL 350 MG/ML SOLN
75.0000 mL | Freq: Once | INTRAVENOUS | Status: AC | PRN
  Administered 2020-03-17: 75 mL via INTRAVENOUS

## 2020-03-17 MED ORDER — ACETAMINOPHEN 650 MG RE SUPP: 650.0000 mg | Freq: Four times a day (QID) | RECTAL | Status: DC | PRN

## 2020-03-17 MED ORDER — ASPIRIN 81 MG PO CHEW
324.0000 mg | CHEWABLE_TABLET | Freq: Once | ORAL | Status: AC
  Administered 2020-03-17: 324 mg via ORAL
  Filled 2020-03-17: qty 4

## 2020-03-17 MED ORDER — SODIUM CHLORIDE 0.9% FLUSH
3.0000 mL | Freq: Two times a day (BID) | INTRAVENOUS | Status: DC
  Administered 2020-03-17 – 2020-03-20 (×5): 3 mL via INTRAVENOUS

## 2020-03-17 MED ORDER — METFORMIN HCL 500 MG PO TABS
500.0000 mg | ORAL_TABLET | Freq: Two times a day (BID) | ORAL | Status: DC
  Filled 2020-03-17: qty 1

## 2020-03-17 MED ORDER — ACETAMINOPHEN 325 MG PO TABS: 650.0000 mg | ORAL_TABLET | Freq: Four times a day (QID) | ORAL | Status: DC | PRN

## 2020-03-17 MED ORDER — ENOXAPARIN SODIUM 40 MG/0.4ML ~~LOC~~ SOLN
40.0000 mg | SUBCUTANEOUS | Status: DC
  Administered 2020-03-17 – 2020-03-19 (×3): 40 mg via SUBCUTANEOUS
  Filled 2020-03-17 (×3): qty 0.4

## 2020-03-17 MED ORDER — ALBUTEROL SULFATE HFA 108 (90 BASE) MCG/ACT IN AERS
2.0000 | INHALATION_SPRAY | Freq: Four times a day (QID) | RESPIRATORY_TRACT | Status: DC | PRN
  Filled 2020-03-17: qty 6.7

## 2020-03-17 NOTE — ED Notes (Signed)
Pts pulse ox remained 94% and above on room air with ambulation. MD aware.

## 2020-03-17 NOTE — ED Notes (Signed)
First Nurse Note: Pt to ED via [facility] from home. Pt confirmed COVID positive on Tuesday. Pt having Shortness of breath. Pt was given medication at [facility] but does not know what it was. Pt was ambulatory on scene with EMS. Pt VSS.

## 2020-03-17 NOTE — ED Triage Notes (Signed)
Pt arrived via ems from home. Pt reports testing covid + on 2/8. C/o SOB, and chest pain since Tuesday. RR even, unlabored, symmetrical, and speaking in full sentences. Pt was seen at PCP on Tuesday and given two medications but pt unsure of names. NAD noted in triage

## 2020-03-17 NOTE — ED Notes (Signed)
Pt transported to CT ?

## 2020-03-17 NOTE — H&P (Signed)
History and Physical   Joe Coleman NWG:[id number] DOB: 1945/07/20 DOA: 03/17/2020  PCP: Maryann [place]ners, MD   Patient coming from: Home  Chief Complaint: Shortness of breath  HPI: Joe Coleman is a 75 y.o. male with medical history significant of stomach and colon cancer, hypertension, diabetes who presents with some progressive shortness of breath.  Patient states he was diagnosed with Covid on 2/8 and has had some progressive shortness of breath since that time.  Also reports fatigue and wheezing.  States he was started on an oral antiviral for Covid by his outpatient provider, but he does not remember the name of it. He also reports some intermittent chest tightness and decreased appetite. He has been prescribed an albuterol inhaler for years and he takes it as needed but he is not sure what it was prescribed for. He denies fever, chills, cough, chest pain, abdominal pain, nausea, vomiting, constipation, diarrhea.  ED Course: Vital signs in the ED significant for respiratory rate in the 20s to 30s, heart rate in the 90s to 100s, blood pressure in the 578I to 696E systolic.  Reportedly with ambulation he became more tachypneic and tachycardic.  He has continued to saturate well on room air however.  Lab work-up showed BMP with creatinine elevated to 1.5 to increase from baseline 3 years ago 1.1, glucose 131.  CBC showed hemoglobin of 11.7 which is stable from previous of 3 years ago.  Troponin negative x2, D-dimer elevated to 875.  Imaging showed chest x-ray with hyperexpanded lungs but no acute disease.  CTA chest was without signs of PE nor acute pulmonary disease.  Patient was given 2 L of IV fluids, duo nebs, Solu-Medrol in the ED.  Review of Systems: As per HPI otherwise all other systems reviewed and are negative.  Past Medical History:  Diagnosis Date  . 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 Diabetes mellitus without complication ([place])   . Hypertension     Past  Surgical History:  Procedure Laterality Date  . APPENDECTOMY    . COLON SURGERY      Social History  reports that he has quit smoking. He has never used smokeless tobacco. He reports that he does not drink alcohol and does not use drugs.  Allergies  Allergen Reactions  . Chicken Allergy Other (See Comments)    Other reaction(s): VOMITING Other reaction(s): VOMITING   . Losartan     Hyperkalemia (>5.5 multiple times on losartan 25mg )  . Poultry Meal Nausea And Vomiting    Family History  Problem Relation Age of Onset  . Heart disease Mother   . Heart disease Brother   Reviewed on admission  Prior to Admission medications   Medication Sig Start Date End Date Taking? Authorizing Provider  Nirmatrelvir & Ritonavir (PAXLOVID) 20 x 150 MG & 10 x 100MG  TBPK Morning Dose: Take one nirmatrelvir tablet with one ritonavir tablet at the same time from the morning dose portion of the blister card (left half, yellow side). Evening Dose: Take one nirmatrelvir tablet with one ritonavir tablet at the same time from the evening dose portion of the blister card (right half, blue side) 03/14/20  Yes [provider]  albuterol (PROVENTIL HFA;VENTOLIN HFA) 108 (90 Base) MCG/ACT inhaler Inhale 2 puffs into the lungs every 6 (six) hours as needed for wheezing or shortness of breath. 04/01/16   Merlyn Lot, MD  albuterol (PROVENTIL HFA;VENTOLIN HFA) 108 (90 Base) MCG/ACT inhaler Inhale 2 puffs into the  lungs every 6 (six) hours as needed for wheezing or shortness of breath. 04/17/16   Loney Hering, MD  amLODipine (NORVASC) 5 MG tablet Take 5 mg by mouth daily. 02/25/20   [provider]  aspirin EC 81 MG tablet Take 81 mg by mouth daily.    [provider]  gabapentin (NEURONTIN) 300 MG capsule Take 300 mg by mouth at bedtime.    [provider]  losartan (COZAAR) 25 MG tablet Take 1 tablet by mouth daily. 03/20/16   [provider]  metFORMIN  (GLUCOPHAGE) 500 MG tablet Take by mouth 2 (two) times daily with a meal.    [provider]  Spacer/Aero-Holding Chambers (AEROCHAMBER MV) inhaler Use as instructed 04/17/16   Loney Hering, MD    Physical Exam: Vitals:   03/17/20 1930 03/17/20 2000 03/17/20 2030 03/17/20 2100  BP: (!) 117/59 (!) 129/58 (!) 150/70 (!) 153/60  Pulse: (!) 115 100 (!) 107 (!) 104  Resp: (!) 25 (!) 31 (!) 31 (!) 32  Temp:      TempSrc:      SpO2: 95% 100% 100% 98%  Weight:      Height:       Physical Exam Constitutional:      General: He is not in acute distress.    Appearance: Normal appearance.  HENT:     Head: Normocephalic and atraumatic.     Mouth/Throat:     Mouth: Mucous membranes are moist.     Pharynx: Oropharynx is clear.  Eyes:     Extraocular Movements: Extraocular movements intact.     Pupils: Pupils are equal, round, and reactive to light.  Cardiovascular:     Rate and Rhythm: Regular rhythm. Tachycardia present.     Pulses: Normal pulses.     Heart sounds: Normal heart sounds.  Pulmonary:     Effort: Pulmonary effort is normal. No respiratory distress.     Breath sounds: Wheezing (Trace) present.     Comments: Tachypnea Abdominal:     General: Bowel sounds are normal. There is no distension.     Palpations: Abdomen is soft.     Tenderness: There is no abdominal tenderness.  Musculoskeletal:        General: No swelling or deformity.  Skin:    General: Skin is warm and dry.  Neurological:     General: No focal deficit present.     Mental Status: Mental status is at baseline.    Labs on Admission: I have personally reviewed following labs and imaging studies  CBC: Recent Labs  Lab 03/17/20 1414  WBC 4.3  HGB 11.7*  HCT 38.1*  MCV 86.6  PLT 161    Basic Metabolic Panel: Recent Labs  Lab 03/17/20 1720  NA 136  K 4.6  CL 104  CO2 25  GLUCOSE 131*  BUN 29*  CREATININE 1.52*  CALCIUM 8.6*    GFR: Estimated Creatinine Clearance: 40 mL/min  (A) (by C-G formula based on SCr of 1.52 mg/dL (H)).  Liver Function Tests: No results for input(s): AST, ALT, ALKPHOS, BILITOT, PROT, ALBUMIN in the last 168 hours.  Urine analysis: No results found for: COLORURINE, APPEARANCEUR, LABSPEC, PHURINE, GLUCOSEU, HGBUR, BILIRUBINUR, KETONESUR, PROTEINUR, UROBILINOGEN, NITRITE, LEUKOCYTESUR  Radiological Exams on Admission: DG Chest 2 View  Result Date: 03/17/2020 CLINICAL DATA:  Pt arrived via ems from home. Pt reports testing covid + on 2/8. C/o SOB, and chest pain since Tuesday. Pt was seen at PCP on Tuesday and  given two medications but pt unsure of names. Hx of HTN and diabetes. EXAM: CHEST - 2 VIEW COMPARISON:  04/01/2018 FINDINGS: Cardiac silhouette is normal in size and configuration. Normal mediastinal and hilar contours. Lungs mildly hyperexpanded, but clear. No pleural effusion or pneumothorax. Stable surgical vascular clips in the epigastric region of the upper abdomen. Skeletal structures are intact. IMPRESSION: No active cardiopulmonary disease. Electronically Signed   By: Lajean Manes M.D.   On: 03/17/2020 14:42   CT Angio Chest PE W and/or Wo Contrast  Result Date: 03/17/2020 CLINICAL DATA:  COVID positive with shortness of breath. EXAM: CT ANGIOGRAPHY CHEST WITH CONTRAST TECHNIQUE: Multidetector CT imaging of the chest was performed using the standard protocol during bolus administration of intravenous contrast. Multiplanar CT image reconstructions and MIPs were obtained to evaluate the vascular anatomy. CONTRAST:  91mL OMNIPAQUE IOHEXOL 350 MG/ML SOLN COMPARISON:  April 01, 2016 FINDINGS: Cardiovascular: There is mild calcification of the aortic arch and descending thoracic aorta. Satisfactory opacification of the pulmonary arteries to the segmental level. No evidence of pulmonary embolism. Normal heart size. No pericardial effusion. Mediastinum/Nodes: No enlarged mediastinal, hilar, or axillary lymph nodes. Thyroid gland, trachea, and  esophagus demonstrate no significant findings. Lungs/Pleura: Mild, stable scarring and/or atelectasis is seen within the medial aspect of the right middle lobe and posterior aspect of the left lung base. There is no evidence of acute infiltrate, pleural effusion or pneumothorax. Upper Abdomen: Multiple surgical clips are seen along the esophageal hiatus. Numerous subcentimeter gallstones are seen within the lumen of an otherwise normal-appearing gallbladder. Musculoskeletal: Multilevel degenerative changes seen throughout the thoracic spine. Review of the MIP images confirms the above findings. IMPRESSION: 1. No CT evidence of pulmonary embolism or acute cardiopulmonary disease. 2. Cholelithiasis. 3. Aortic atherosclerosis. Aortic Atherosclerosis (ICD10-I70.0). Electronically Signed   By: Virgina Norfolk M.D.   On: 03/17/2020 19:13    EKG: Independently reviewed.  Normal sinus rhythm at 93 bpm some nonspecific T wave abnormalities in aVL.  Assessment/Plan Principal Problem:   AKI (acute kidney injury) ([place]) Active Problems:   COVID-19 virus infection   COPD with acute exacerbation ([place])   Diabetes ([place])   HTN (hypertension)  AKI > Patient with decreased p.o. intake in the setting of viral illness due to COVID-19 > Creatinine elevated to 1.52 from baseline of 1.1 > Received 2 L of IV fluids in the ED - Continue IV fluids at 125 cc/h - Avoid nephrotoxic agents - Trend renal function and electrolytes  COVID-19 infection COPD exacerbation > Patient with hyperinflated lungs on chest x-ray, wheezing, shortness of breath in the setting of COVID-19 infection. (No PE nor acute cardiopulmonary disease on CT chest) > Has been started on oral antiviral outpatient per his report but does not member the name. > Some ports of intermittent chest tightness, troponins negative x2 in the ED. > Received Solu-Medrol and DuoNebs in the ED > Former smoker - Continue scheduled duo nebs while awake - Daily  prednisone - As needed albuterol - Continuous pulse ox - Will need outpatient PFTs  Hypertension  - Holding home losartan as he is somewhat dehydrated and has AKI  Diabetes - Continue home Metformin  DVT prophylaxis: Lovenox  Code Status:   Full  Family Communication:  None on admission Disposition Plan:   Patient is from:  Home  Anticipated DC to:  Home  Anticipated DC date:  1 to 2 days  Anticipated DC barriers: None  Consults called:  None  Admission status:  Observation,  MedSurg  Severity of Illness: The appropriate patient status for this patient is OBSERVATION. Observation status is judged to be reasonable and necessary in order to provide the required intensity of service to ensure the patient's safety. The patient's presenting symptoms, physical exam findings, and initial radiographic and laboratory data in the context of their medical condition is felt to place them at decreased risk for further clinical deterioration. Furthermore, it is anticipated that the patient will be medically stable for discharge from the hospital within 2 midnights of admission. The following factors support the patient status of observation.   " The patient's presenting symptoms include shortness of breath. " The physical exam findings include tachypnea, tachycardia, wheezes. " The initial radiographic and laboratory data are concerning for hyperexpanded lungs, creatinine elevated to 1.52, elevated D-dimer.   Marcelyn Bruins MD [facility]  How to contact the [facility] Attending or Consulting provider [place] or covering provider during after hours [place], for this patient?   1. Check the care team in [facility] and look for a) attending/consulting TR[facility] provider listed and b) the [facility] team listed 2. Log into [email or web address] and use [place] universal password to access. If you do not have the password, please contact the hospital operator. 3. Locate the [facility] provider you are looking for under  [facility] and page to a number that you can be directly reached. 4. If you still have difficulty reaching the provider, please page the [facility] (Director on Call) for the Hospitalists listed on amion for assistance.  03/17/2020, 9:26 PM

## 2020-03-17 NOTE — ED Notes (Signed)
Pt provided with peanut butter and crackers and ginger ale.

## 2020-03-17 NOTE — ED Provider Notes (Signed)
[facility] Emergency Department Provider Note  ____________________________________________   Event Date/Time   First MD Initiated Contact with Patient 03/17/20 1616     (approximate)  I have reviewed the triage vital signs and the nursing notes.   HISTORY  Chief Complaint Shortness of Breath and Chest Pain    HPI Joe Coleman is a 75 y.o. male with history of hypertension, diabetes, here with shortness of breath.  The patient states that he was diagnosed with Covid on 2/8.  Since then, he has had progressive shortness of breath.  He states he feels generally fatigued.  He has been wheezing.  He states he has a former smoking history but no longer smokes currently.  He was started on antiviral treatment.  Is been taking this and does not believe it is helping very much.  He states he feels short of breath with any kind of exertion.  Denies any chest pain.  Denies any leg swelling.  He does not know or believe that he has had fevers.  He has had slightly decreased appetite.  No other complaints.        Past Medical History:  Diagnosis Date  . 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 Diabetes mellitus without complication ([place])   . Hypertension     There are no problems to display for this patient.   Past Surgical History:  Procedure Laterality Date  . APPENDECTOMY    . COLON SURGERY      Prior to Admission medications   Medication Sig Start Date End Date Taking? Authorizing Provider  albuterol (PROVENTIL HFA;VENTOLIN HFA) 108 (90 Base) MCG/ACT inhaler Inhale 2 puffs into the lungs every 6 (six) hours as needed for wheezing or shortness of breath. 04/01/16   Merlyn Lot, MD  albuterol (PROVENTIL HFA;VENTOLIN HFA) 108 (90 Base) MCG/ACT inhaler Inhale 2 puffs into the lungs every 6 (six) hours as needed for wheezing or shortness of breath. 04/17/16   Loney Hering, MD  aspirin EC 81 MG tablet Take 81 mg by mouth daily.    [provider]  gabapentin (NEURONTIN) 300 MG capsule Take 300 mg by mouth at bedtime.    [provider]  losartan (COZAAR) 25 MG tablet Take 1 tablet by mouth daily. 03/20/16   [provider]  metFORMIN (GLUCOPHAGE) 500 MG tablet Take by mouth 2 (two) times daily with a meal.    [provider]  Spacer/Aero-Holding Chambers (AEROCHAMBER MV) inhaler Use as instructed 04/17/16   Loney Hering, MD    Allergies Poultry meal  History reviewed. No pertinent family history.  Social History Social History   Tobacco Use  . Smoking status: Former Research scientist (life sciences)  . Smokeless tobacco: Never Used  Substance Use Topics  . Alcohol use: No  . Drug use: No    Review of Systems  Review of Systems  Constitutional: Positive for chills and fatigue. Negative for fever.  HENT: Negative for sore throat.   Respiratory: Positive for cough, shortness of breath and wheezing.   Cardiovascular: Negative for chest pain.  Gastrointestinal: Negative for abdominal pain.  Genitourinary: Negative for flank pain.  Musculoskeletal: Negative for neck pain.  Skin: Negative for rash and wound.  Allergic/Immunologic: Negative for immunocompromised state.  Neurological: Negative for weakness and numbness.  Hematological: Does not bruise/bleed easily.  All other systems reviewed and are negative.    ____________________________________________  PHYSICAL EXAM:      VITAL SIGNS: ED Triage Vitals  Enc Vitals Group     BP 03/17/20 1401 127/84     Pulse Rate 03/17/20 1401 91     Resp 03/17/20 1401 20     Temp 03/17/20 1401 97.8 F (36.6 C)     Temp Source 03/17/20 1401 Oral     SpO2 03/17/20 1401 97 %     Weight 03/17/20 1410 165 lb (74.8 kg)     Height 03/17/20 1410 5\' 8"  (1.727 m)     Head Circumference --      Peak Flow --      Pain Score 03/17/20 1409 10     Pain Loc --      Pain Edu? --      Excl. in [place]? --      Physical Exam Vitals and nursing note reviewed.   Constitutional:      General: He is not in acute distress.    Appearance: He is well-developed.  HENT:     Head: Normocephalic and atraumatic.  Eyes:     Conjunctiva/sclera: Conjunctivae normal.  Cardiovascular:     Rate and Rhythm: Normal rate and regular rhythm.     Heart sounds: Normal heart sounds. No murmur heard. No friction rub.  Pulmonary:     Effort: Pulmonary effort is normal. No respiratory distress.     Breath sounds: Examination of the right-middle field reveals wheezing. Examination of the left-middle field reveals wheezing. Examination of the right-lower field reveals wheezing. Examination of the left-lower field reveals wheezing. Wheezing present. No rales.  Abdominal:     General: There is no distension.     Palpations: Abdomen is soft.     Tenderness: There is no abdominal tenderness.  Musculoskeletal:     Cervical back: Neck supple.  Skin:    General: Skin is warm.     Capillary Refill: Capillary refill takes less than 2 seconds.  Neurological:     Mental Status: He is alert and oriented to person, place, and time.     Motor: No abnormal muscle tone.       ____________________________________________   LABS (all labs ordered are listed, but only abnormal results are displayed)  Labs Reviewed  CBC - Abnormal; Notable for the following components:      Result Value   Hemoglobin 11.7 (*)    HCT 38.1 (*)    All other components within normal limits  BASIC METABOLIC PANEL - Abnormal; Notable for the following components:   Glucose, Bld 131 (*)    BUN 29 (*)    Creatinine, Ser 1.52 (*)    Calcium 8.6 (*)    GFR, Estimated 47 (*)    All other components within normal limits  FIBRIN DERIVATIVES D-DIMER ([facility] ONLY) - Abnormal; Notable for the following components:   Fibrin derivatives D-dimer ([facility]) 875.03 (*)    All other components within normal limits  TROPONIN I (HIGH SENSITIVITY)  TROPONIN I (HIGH SENSITIVITY)     ____________________________________________  EKG: Normal sinus rhythm, ventricular rate 93.  PR 118, QRS 90, QTc 425.  No acute ST elevations or depressions.  No EKG evidence of acute ischemia or infarct. ________________________________________  RADIOLOGY All imaging, including plain films, CT scans, and ultrasounds, independently reviewed by me, and interpretations confirmed via formal radiology reads.  ED MD interpretation:   Chest x-ray: No acute disease  Official radiology report(s): DG Chest 2 View  Result Date: 03/17/2020 CLINICAL DATA:  Pt arrived via ems from home. Pt reports testing covid + on 2/8.  C/o SOB, and chest pain since Tuesday. Pt was seen at PCP on Tuesday and given two medications but pt unsure of names. Hx of HTN and diabetes. EXAM: CHEST - 2 VIEW COMPARISON:  04/01/2018 FINDINGS: Cardiac silhouette is normal in size and configuration. Normal mediastinal and hilar contours. Lungs mildly hyperexpanded, but clear. No pleural effusion or pneumothorax. Stable surgical vascular clips in the epigastric region of the upper abdomen. Skeletal structures are intact. IMPRESSION: No active cardiopulmonary disease. Electronically Signed   By: Lajean Manes M.D.   On: 03/17/2020 14:42   CT Angio Chest PE W and/or Wo Contrast  Result Date: 03/17/2020 CLINICAL DATA:  COVID positive with shortness of breath. EXAM: CT ANGIOGRAPHY CHEST WITH CONTRAST TECHNIQUE: Multidetector CT imaging of the chest was performed using the standard protocol during bolus administration of intravenous contrast. Multiplanar CT image reconstructions and MIPs were obtained to evaluate the vascular anatomy. CONTRAST:  27mL OMNIPAQUE IOHEXOL 350 MG/ML SOLN COMPARISON:  April 01, 2016 FINDINGS: Cardiovascular: There is mild calcification of the aortic arch and descending thoracic aorta. Satisfactory opacification of the pulmonary arteries to the segmental level. No evidence of pulmonary embolism. Normal heart  size. No pericardial effusion. Mediastinum/Nodes: No enlarged mediastinal, hilar, or axillary lymph nodes. Thyroid gland, trachea, and esophagus demonstrate no significant findings. Lungs/Pleura: Mild, stable scarring and/or atelectasis is seen within the medial aspect of the right middle lobe and posterior aspect of the left lung base. There is no evidence of acute infiltrate, pleural effusion or pneumothorax. Upper Abdomen: Multiple surgical clips are seen along the esophageal hiatus. Numerous subcentimeter gallstones are seen within the lumen of an otherwise normal-appearing gallbladder. Musculoskeletal: Multilevel degenerative changes seen throughout the thoracic spine. Review of the MIP images confirms the above findings. IMPRESSION: 1. No CT evidence of pulmonary embolism or acute cardiopulmonary disease. 2. Cholelithiasis. 3. Aortic atherosclerosis. Aortic Atherosclerosis (ICD10-I70.0). Electronically Signed   By: Virgina Norfolk M.D.   On: 03/17/2020 19:13    ____________________________________________  PROCEDURES   Procedure(s) performed (including Critical Care):  .1-3 Lead EKG Interpretation Performed by: Duffy Bruce, MD Authorized by: Duffy Bruce, MD     Interpretation: abnormal     ECG rate:  100-120   ECG rate assessment: normal     Rhythm: sinus tachycardia     Ectopy: none     Conduction: normal   Comments:     Indication: Chest pain, SOB     ____________________________________________  INITIAL IMPRESSION / MDM / ASSESSMENT AND PLAN / ED COURSE  As part of my medical decision making, I reviewed the following data within the [place] notes reviewed and incorporated, Old chart reviewed, Notes from prior ED visits, and [place] Controlled Substance Database       *Joe Coleman was evaluated in Emergency Department on 03/17/2020 for the symptoms described in the history of present illness. He was evaluated in the context of the global  COVID-19 pandemic, which necessitated consideration that the patient might be at risk for infection with the SARS-CoV-2 virus that causes COVID-19. Institutional protocols and algorithms that pertain to the evaluation of patients at risk for COVID-19 are in a state of rapid change based on information released by regulatory bodies including the CDC and federal and state organizations. These policies and algorithms were followed during the patient's care in the ED.  Some ED evaluations and interventions may be delayed as a result of limited staffing during the pandemic.*     Medical Decision Making: 75 year old  male with history of COVID-19 and previous tobacco abuse here with shortness of breath.  Patient has diffuse wheezing and tachypnea on exam.  He was given steroids, nebulizers.  Reviewed his previous tests which do confirm COVID-19 positivity.  Chest x-ray clear.  Screening lab work sent and reviewed.  White count normal.  BMP with likely mild AKI due to his poor p.o. intake.  D-dimer elevated at 875, so CT angio obtained.  Fortunately, CT shows no evidence of PE.  His lungs actually appear fairly well.  That being said, he has significant tachycardia, tachypnea, and wheezing despite nebulizers and treatments.  I suspect some of his symptoms are actually more so due to dehydration and COPD exacerbation.  Given his age and tachypnea with tachycardia, will admit for further treatment and fluids.  ____________________________________________  FINAL CLINICAL IMPRESSION(S) / ED DIAGNOSES  Final diagnoses:  COPD exacerbation ([place])  COVID-19  AKI (acute kidney injury) ([place])     MEDICATIONS GIVEN DURING THIS VISIT:  Medications  sodium chloride 0.9 % bolus 1,000 mL (has no administration in time range)  methylPREDNISolone sodium succinate (SOLU-MEDROL) 125 mg/2 mL injection 125 mg (125 mg Intravenous Given 03/17/20 1708)  ipratropium-albuterol (DUONEB) 0.5-2.5 (3) MG/3ML nebulizer solution 3 mL (3  mLs Nebulization Given 03/17/20 1709)  ipratropium-albuterol (DUONEB) 0.5-2.5 (3) MG/3ML nebulizer solution 3 mL (3 mLs Nebulization Given 03/17/20 1709)  ipratropium-albuterol (DUONEB) 0.5-2.5 (3) MG/3ML nebulizer solution 3 mL (3 mLs Nebulization Given 03/17/20 1708)  sodium chloride 0.9 % bolus 1,000 mL (1,000 mLs Intravenous New Bag/Given 03/17/20 1845)  iohexol (OMNIPAQUE) 350 MG/ML injection 75 mL (75 mLs Intravenous Contrast Given 03/17/20 1854)     ED Discharge Orders    None       Note:  This document was prepared using Dragon voice recognition software and may include unintentional dictation errors.   Duffy Bruce, MD 03/17/20 1946

## 2020-03-18 DIAGNOSIS — D696 Thrombocytopenia, unspecified: Secondary | ICD-10-CM | POA: Diagnosis not present

## 2020-03-18 DIAGNOSIS — N179 Acute kidney failure, unspecified: Secondary | ICD-10-CM | POA: Diagnosis not present

## 2020-03-18 DIAGNOSIS — U071 COVID-19: Secondary | ICD-10-CM

## 2020-03-18 LAB — GLUCOSE, CAPILLARY
Glucose-Capillary: 246 mg/dL — ABNORMAL HIGH (ref 70–99)
Glucose-Capillary: 285 mg/dL — ABNORMAL HIGH (ref 70–99)
Glucose-Capillary: 145 mg/dL — ABNORMAL HIGH (ref 70–99)
Glucose-Capillary: 195 mg/dL — ABNORMAL HIGH (ref 70–99)

## 2020-03-18 LAB — BASIC METABOLIC PANEL
Anion gap: 11 (ref 5–15)
BUN: 28 mg/dL — ABNORMAL HIGH (ref 8–23)
CO2: 19 mmol/L — ABNORMAL LOW (ref 22–32)
Calcium: 8 mg/dL — ABNORMAL LOW (ref 8.9–10.3)
Chloride: 106 mmol/L (ref 98–111)
Creatinine, Ser: 1.63 mg/dL — ABNORMAL HIGH (ref 0.61–1.24)
GFR, Estimated: 43 mL/min — ABNORMAL LOW (ref >60)
Glucose, Bld: 316 mg/dL — ABNORMAL HIGH (ref 70–99)
Potassium: 4.7 mmol/L (ref 3.5–5.1)
Sodium: 136 mmol/L (ref 135–145)

## 2020-03-18 LAB — CBC
HCT: 31.7 % — ABNORMAL LOW (ref 39.0–52.0)
Hemoglobin: 10 g/dL — ABNORMAL LOW (ref 13.0–17.0)
MCH: 27 pg (ref 26.0–34.0)
MCHC: 31.5 g/dL (ref 30.0–36.0)
MCV: 85.4 fL (ref 80.0–100.0)
Platelets: 144 10*3/uL — ABNORMAL LOW (ref 150–400)
RBC: 3.71 MIL/uL — ABNORMAL LOW (ref 4.22–5.81)
RDW: 14.4 % (ref 11.5–15.5)
WBC: 2.9 10*3/uL — ABNORMAL LOW (ref 4.0–10.5)
nRBC: 0 % (ref 0.0–0.2)

## 2020-03-18 MED ORDER — INSULIN ASPART 100 UNIT/ML ~~LOC~~ SOLN
0.0000 [IU] | Freq: Three times a day (TID) | SUBCUTANEOUS | Status: DC
  Administered 2020-03-18: 13:00:00 5 [IU] via SUBCUTANEOUS
  Administered 2020-03-18: 17:00:00 1 [IU] via SUBCUTANEOUS
  Administered 2020-03-19 (×2): 3 [IU] via SUBCUTANEOUS
  Administered 2020-03-19: 13:00:00 5 [IU] via SUBCUTANEOUS
  Administered 2020-03-20: 10:00:00 7 [IU] via SUBCUTANEOUS
  Administered 2020-03-20: 3 [IU] via SUBCUTANEOUS
  Filled 2020-03-18 (×7): qty 1

## 2020-03-18 MED ORDER — TRAMADOL HCL 50 MG PO TABS: 50.0000 mg | ORAL_TABLET | Freq: Four times a day (QID) | ORAL | Status: DC

## 2020-03-18 MED ORDER — MELATONIN 5 MG PO TABS
5.0000 mg | ORAL_TABLET | Freq: Every day | ORAL | Status: DC
  Administered 2020-03-18 – 2020-03-19 (×3): 5 mg via ORAL
  Filled 2020-03-18 (×4): qty 1

## 2020-03-18 MED ORDER — TRAZODONE HCL 50 MG PO TABS
50.0000 mg | ORAL_TABLET | Freq: Every evening | ORAL | Status: DC | PRN
  Administered 2020-03-18 – 2020-03-19 (×2): 50 mg via ORAL
  Filled 2020-03-18 (×2): qty 1

## 2020-03-18 MED ORDER — IPRATROPIUM-ALBUTEROL 20-100 MCG/ACT IN AERS
1.0000 | INHALATION_SPRAY | Freq: Four times a day (QID) | RESPIRATORY_TRACT | Status: DC
  Administered 2020-03-18 – 2020-03-20 (×9): 1 via RESPIRATORY_TRACT
  Filled 2020-03-18: qty 4

## 2020-03-18 MED ORDER — TRAMADOL HCL 50 MG PO TABS
50.0000 mg | ORAL_TABLET | Freq: Four times a day (QID) | ORAL | Status: DC | PRN
  Administered 2020-03-18: 03:00:00 50 mg via ORAL
  Filled 2020-03-18: qty 1

## 2020-03-18 NOTE — Progress Notes (Signed)
OT Cancellation Note  Patient Details Name: Joe Coleman MRN: [id number] DOB: 05-10-1945   Cancelled Treatment:    Reason Eval/Treat Not Completed: OT screened, no needs identified, will sign off  Upon chart review and in speaking with patient and physical therapist, pt is currently completing ADLs at his self-reported functional baseline with exception of medical equipment management (such as staff assist to manage IV pole). Will complete order at this time as no acute OT needs identified. Please re-consult should needs change. Thank you.  Gerrianne Scale, [place], OTR/L ascom [phone] 03/18/20, 4:39 PM

## 2020-03-18 NOTE — TOC Progression Note (Addendum)
Transition of Care [facility]) - Progression Note    Patient Details  Name: Joe Coleman MRN: [id number] Date of Birth: [birth year]  [facility] CM/SW Contact  Izola Price, RN Phone Number: 03/18/2020, 2:11 PM  Clinical Narrative:  Covid + 2/8. Admitted [facility] 2/11 with SOB, chest tightness. Significant PMH. Patient in OBS status and OBS continued per provider notes today. Anticipated DC 03/19/20. Not medically stable today for discharge. Currently at 17 hours in OBS status. Would be at 25 hours at 0800 03/19/20. Pt Covid + on 03/14/20 at [facility].  Will continue to monitor status and discharge needs.   Pt evaluation today at 1258 indicated no recommendations for PT at discharge. Simmie Davies RN CM at 1610 pm.   PCP Dr. Myrna Blazer Smithson on record.  [place]: Relative contact [phone]   Simmie Davies RN CM         Expected Discharge Plan and Services                                                 Social Determinants of Health (SDOH) Interventions    Readmission Risk Interventions No flowsheet data found.

## 2020-03-18 NOTE — Evaluation (Signed)
Physical Therapy Evaluation Patient Details Name: Joe Coleman MRN: [id number] DOB: 12/31/1945 Today's Date: 03/18/2020   History of Present Illness  presented to ER secondary to progressive SOB; admitted for management of AKI, COPD exacerbation and COVID-19 infection.  Clinical Impression  Patient resting in bed upon arrival to room; alert and oriented; follows commands and demonstrates good effort with functional tasks.  Denies pain.  Bilat UE/LE strength and ROM grossly symmetrical and WFL; no focal weakness appreciated.  Able to complete bed mobility indep; sit/stand, basic transfers and gait (100') without assist device, mod indep.  Demonstrates reciprocal stepping pattern with fair step height/length, fair cadence and gait speed; negotiates turns, stationary obstacles and surface changes without difficulty.  Sats >98% on RA.  Reports feeling comfortable/confident with gait efforts; pleased with his own performance during session. Would benefit from skilled PT to address above deficits and promote optimal return to PLOF.; will maintain on caseload throughout remaining stay to ensure progressive mobilization.  Anticipate no formal PT needs at discharge.    Follow Up Recommendations No PT follow up    Equipment Recommendations       Recommendations for Other Services       Precautions / Restrictions Precautions Precautions: None Restrictions Weight Bearing Restrictions: No      Mobility  Bed Mobility Overal bed mobility: Independent                  Transfers Overall transfer level: Modified independent Equipment used: None             General transfer comment: good LE strength/control  Ambulation/Gait Ambulation/Gait assistance: Supervision Gait Distance (Feet): 100 Feet Assistive device: None       General Gait Details: reciprocal stepping pattern with fair step height/length, fair cadence and gait speed; negotiates turns, stationary obstacles and  surface changes without difficulty.  Sats >98% on RA.  Stairs            Wheelchair Mobility    Modified Rankin (Stroke Patients Only)       Balance Overall balance assessment: Modified Independent                                           Pertinent Vitals/Pain Pain Assessment: No/denies pain    Home Living Family/patient expects to be discharged to:: Private residence Living Arrangements: Alone   Type of Home: House Home Access: Stairs to enter Entrance Stairs-Rails: Right Entrance Stairs-Number of Steps: 3-4 Home Layout: One level        Prior Function Level of Independence: Independent         Comments: Indep with ADLs, household and community mobilization without assist device; denies recent fall history; no home O2     Hand Dominance   Dominant Hand: Right    Extremity/Trunk Assessment   Upper Extremity Assessment Upper Extremity Assessment: Overall WFL for tasks assessed    Lower Extremity Assessment Lower Extremity Assessment: Overall WFL for tasks assessed       Communication   Communication: No difficulties  Cognition Arousal/Alertness: Awake/alert Behavior During Therapy: WFL for tasks assessed/performed Overall Cognitive Status: Within Functional Limits for tasks assessed                                        General Comments  Exercises Other Exercises Other Exercises: Toilet transfer, ambulatory without assist device, mod indep; standing balance for clothing management, hygiene and hand hygiene at sink, sup/mod indep.  Good functional reach, good awareness/insight into limits of stability   Assessment/Plan    PT Assessment Patient needs continued PT services  PT Problem List Decreased balance;Decreased mobility;Decreased activity tolerance;Cardiopulmonary status limiting activity       PT Treatment Interventions DME instruction;Gait training;Functional mobility training;Therapeutic  activities;Patient/family education;Stair training;Balance training;Therapeutic exercise    PT Goals (Current goals can be found in the Care Plan section)  Acute Rehab PT Goals Patient Stated Goal: to return home PT Goal Formulation: With patient Time For Goal Achievement: 04/01/20 Potential to Achieve Goals: Good    Frequency Min 2X/week   Barriers to discharge        Co-evaluation               AM-PAC PT "6 Clicks" Mobility  Outcome Measure Help needed turning from your back to your side while in a flat bed without using bedrails?: None Help needed moving from lying on your back to sitting on the side of a flat bed without using bedrails?: None Help needed moving to and from a bed to a chair (including a wheelchair)?: None Help needed standing up from a chair using your arms (e.g., wheelchair or bedside chair)?: None Help needed to walk in hospital room?: None Help needed climbing 3-5 steps with a railing? : A Little 6 Click Score: 23    End of Session Equipment Utilized During Treatment: Gait belt Activity Tolerance: Patient tolerated treatment well Patient left: in chair;with call bell/phone within reach Nurse Communication: Mobility status PT Visit Diagnosis: Muscle weakness (generalized) (M62.81)    Time: 4695-0722 PT Time Calculation (min) (ACUTE ONLY): 20 min   Charges:   PT Evaluation $PT Eval Low Complexity: 1 Low PT Treatments $Therapeutic Activity: 8-22 mins        Chaquana Nichols H. Owens Shark, PT, DPT, NCS 03/18/20, 2:48 PM [phone]

## 2020-03-18 NOTE — Progress Notes (Signed)
PROGRESS NOTE    Joe Coleman  CBJ:[id number] DOB: [birth year] DOA: 03/17/2020 PCP: Maryann Conners, MD   Assessment & Plan:   Principal Problem:   AKI (acute kidney injury) ([place]) Active Problems:   COVID-19 virus infection   COPD with acute exacerbation ([place])   Diabetes ([place])   HTN (hypertension)    AKI: possibly secondary to decreased po intake. Baseline Cr of 1.1. Cr is trending up from day prior. Continue on IVF  COVID-19 infection: pt c/o shortness of breath still. CXR shows no active disease. Continue on po steroids, bronchodilators and encourage incentive spirometry  Leukopenia: likely secondary to [place]. Will continue to monitor   Thrombocytopenia: likely secondary to [place]. Will continue to monitor   HTN: continue to hold losartan secondary to AKI  DM2: will hold home dose of metformin. Will start SSI w/ accuchecks    DVT prophylaxis: lovenox  Code Status: full Family Communication:  Disposition Plan: likely d/c back home   Level of care: Med-Surg   Status is: Observation  The patient remains OBS appropriate and will d/c before 2 midnights.  Dispo: The patient is from: Home              Anticipated d/c is to: Home              Anticipated d/c date is: 1 day              Patient currently is not medically stable to d/c.   Difficult to place patient No       Consultants:      Procedures:    Antimicrobials:    Subjective: Pt c/o shortness of breath   Objective: Vitals:   03/17/20 2256 03/17/20 2300 03/17/20 2341 03/18/20 0634  BP:  118/60 127/88 (!) 150/91  Pulse:  (!) 109 (!) 107 88  Resp:  (!) 26 18   Temp: 98.3 F (36.8 C)  98 F (36.7 C) 97.6 F (36.4 C)  TempSrc: Oral  Oral   SpO2:  96% 100% 98%  Weight:      Height:       No intake or output data in the 24 hours ending 03/18/20 0727 Filed Weights   03/17/20 1410  Weight: 74.8 kg    Examination:  General exam: Appears uncomfortable  Respiratory  system: diminished breath sounds b/l otherwise clear  Cardiovascular system: S1 & S2 +. No rubs, gallops or clicks. Gastrointestinal system: Abdomen is nondistended, soft and nontender.  Normal bowel sounds heard. Central nervous system: Alert and oriented. Moves all 4 extremities  Psychiatry: Judgement and insight appear normal. Flat mood and affect.     Data Reviewed: I have personally reviewed following labs and imaging studies  CBC: Recent Labs  Lab 03/17/20 1414 03/18/20 0448  WBC 4.3 2.9*  HGB 11.7* 10.0*  HCT 38.1* 31.7*  MCV 86.6 85.4  PLT 183 160*   Basic Metabolic Panel: Recent Labs  Lab 03/17/20 1720 03/18/20 0448  NA 136 136  K 4.6 4.7  CL 104 106  CO2 25 19*  GLUCOSE 131* 316*  BUN 29* 28*  CREATININE 1.52* 1.63*  CALCIUM 8.6* 8.0*   GFR: Estimated Creatinine Clearance: 37.3 mL/min (A) (by C-G formula based on SCr of 1.63 mg/dL (H)). Liver Function Tests: No results for input(s): AST, ALT, ALKPHOS, BILITOT, PROT, ALBUMIN in the last 168 hours. No results for input(s): LIPASE, AMYLASE in the last 168 hours. No results for input(s): AMMONIA in the last 168 hours.  Coagulation Profile: No results for input(s): INR, PROTIME in the last 168 hours. Cardiac Enzymes: No results for input(s): CKTOTAL, CKMB, CKMBINDEX, TROPONINI in the last 168 hours. BNP (last 3 results) No results for input(s): PROBNP in the last 8760 hours. HbA1C: No results for input(s): HGBA1C in the last 72 hours. CBG: Recent Labs  Lab 03/17/20 2348  GLUCAP 329*   Lipid Profile: No results for input(s): CHOL, HDL, LDLCALC, TRIG, CHOLHDL, LDLDIRECT in the last 72 hours. Thyroid Function Tests: No results for input(s): TSH, T4TOTAL, FREET4, T3FREE, THYROIDAB in the last 72 hours. Anemia Panel: No results for input(s): VITAMINB12, FOLATE, FERRITIN, TIBC, IRON, RETICCTPCT in the last 72 hours. Sepsis Labs: No results for input(s): PROCALCITON, LATICACIDVEN in the last 168 hours.  No  results found for this or any previous visit (from the past 240 hour(s)).       Radiology Studies: DG Chest 2 View  Result Date: 03/17/2020 CLINICAL DATA:  Pt arrived via ems from home. Pt reports testing covid + on 2/8. C/o SOB, and chest pain since Tuesday. Pt was seen at PCP on Tuesday and given two medications but pt unsure of names. Hx of HTN and diabetes. EXAM: CHEST - 2 VIEW COMPARISON:  04/01/2018 FINDINGS: Cardiac silhouette is normal in size and configuration. Normal mediastinal and hilar contours. Lungs mildly hyperexpanded, but clear. No pleural effusion or pneumothorax. Stable surgical vascular clips in the epigastric region of the upper abdomen. Skeletal structures are intact. IMPRESSION: No active cardiopulmonary disease. Electronically Signed   By: Lajean Manes M.D.   On: 03/17/2020 14:42   CT Angio Chest PE W and/or Wo Contrast  Result Date: 03/17/2020 CLINICAL DATA:  COVID positive with shortness of breath. EXAM: CT ANGIOGRAPHY CHEST WITH CONTRAST TECHNIQUE: Multidetector CT imaging of the chest was performed using the standard protocol during bolus administration of intravenous contrast. Multiplanar CT image reconstructions and MIPs were obtained to evaluate the vascular anatomy. CONTRAST:  56mL OMNIPAQUE IOHEXOL 350 MG/ML SOLN COMPARISON:  April 01, 2016 FINDINGS: Cardiovascular: There is mild calcification of the aortic arch and descending thoracic aorta. Satisfactory opacification of the pulmonary arteries to the segmental level. No evidence of pulmonary embolism. Normal heart size. No pericardial effusion. Mediastinum/Nodes: No enlarged mediastinal, hilar, or axillary lymph nodes. Thyroid gland, trachea, and esophagus demonstrate no significant findings. Lungs/Pleura: Mild, stable scarring and/or atelectasis is seen within the medial aspect of the right middle lobe and posterior aspect of the left lung base. There is no evidence of acute infiltrate, pleural effusion or  pneumothorax. Upper Abdomen: Multiple surgical clips are seen along the esophageal hiatus. Numerous subcentimeter gallstones are seen within the lumen of an otherwise normal-appearing gallbladder. Musculoskeletal: Multilevel degenerative changes seen throughout the thoracic spine. Review of the MIP images confirms the above findings. IMPRESSION: 1. No CT evidence of pulmonary embolism or acute cardiopulmonary disease. 2. Cholelithiasis. 3. Aortic atherosclerosis. Aortic Atherosclerosis (ICD10-I70.0). Electronically Signed   By: Virgina Norfolk M.D.   On: 03/17/2020 19:13        Scheduled Meds: . aspirin EC  81 mg Oral Daily  . enoxaparin (LOVENOX) injection  40 mg Subcutaneous Q24H  . Ipratropium-Albuterol  1 puff Inhalation Q6H WA  . melatonin  5 mg Oral QHS  . predniSONE  40 mg Oral Q breakfast  . sodium chloride flush  3 mL Intravenous Q12H   Continuous Infusions: . sodium chloride 125 mL/hr at 03/17/20 2156     LOS: 0 days    Time spent: 53  mins     Wyvonnia Dusky, MD [facility] Pager [phone]  If 7PM-7AM, please contact night-coverage 03/18/2020, 7:27 AM

## 2020-03-19 DIAGNOSIS — E875 Hyperkalemia: Secondary | ICD-10-CM | POA: Diagnosis not present

## 2020-03-19 DIAGNOSIS — U071 COVID-19: Secondary | ICD-10-CM | POA: Diagnosis not present

## 2020-03-19 DIAGNOSIS — N179 Acute kidney failure, unspecified: Secondary | ICD-10-CM | POA: Diagnosis not present

## 2020-03-19 LAB — CBC
HCT: 31.8 % — ABNORMAL LOW (ref 39.0–52.0)
Hemoglobin: 10.2 g/dL — ABNORMAL LOW (ref 13.0–17.0)
MCH: 27.2 pg (ref 26.0–34.0)
MCHC: 32.1 g/dL (ref 30.0–36.0)
MCV: 84.8 fL (ref 80.0–100.0)
Platelets: 163 10*3/uL (ref 150–400)
RBC: 3.75 MIL/uL — ABNORMAL LOW (ref 4.22–5.81)
RDW: 14.6 % (ref 11.5–15.5)
WBC: 11.8 10*3/uL — ABNORMAL HIGH (ref 4.0–10.5)
nRBC: 0 % (ref 0.0–0.2)

## 2020-03-19 LAB — BASIC METABOLIC PANEL
Anion gap: 5 (ref 5–15)
BUN: 29 mg/dL — ABNORMAL HIGH (ref 8–23)
CO2: 26 mmol/L (ref 22–32)
Calcium: 8.8 mg/dL — ABNORMAL LOW (ref 8.9–10.3)
Chloride: 108 mmol/L (ref 98–111)
Creatinine, Ser: 1.39 mg/dL — ABNORMAL HIGH (ref 0.61–1.24)
GFR, Estimated: 53 mL/min — ABNORMAL LOW (ref >60)
Glucose, Bld: 231 mg/dL — ABNORMAL HIGH (ref 70–99)
Potassium: 5.3 mmol/L — ABNORMAL HIGH (ref 3.5–5.1)
Sodium: 139 mmol/L (ref 135–145)

## 2020-03-19 LAB — GLUCOSE, CAPILLARY
Glucose-Capillary: 225 mg/dL — ABNORMAL HIGH (ref 70–99)
Glucose-Capillary: 279 mg/dL — ABNORMAL HIGH (ref 70–99)
Glucose-Capillary: 234 mg/dL — ABNORMAL HIGH (ref 70–99)
Glucose-Capillary: 227 mg/dL — ABNORMAL HIGH (ref 70–99)

## 2020-03-19 LAB — POTASSIUM: Potassium: 4.4 mmol/L (ref 3.5–5.1)

## 2020-03-19 MED ORDER — SODIUM ZIRCONIUM CYCLOSILICATE 10 G PO PACK
10.0000 g | PACK | Freq: Once | ORAL | Status: AC
  Administered 2020-03-19: 11:00:00 10 g via ORAL
  Filled 2020-03-19: qty 1

## 2020-03-19 MED ORDER — PANTOPRAZOLE SODIUM 40 MG PO TBEC
40.0000 mg | DELAYED_RELEASE_TABLET | Freq: Every day | ORAL | Status: DC
  Administered 2020-03-19 – 2020-03-20 (×2): 40 mg via ORAL
  Filled 2020-03-19 (×2): qty 1

## 2020-03-19 MED ORDER — DICLOFENAC SODIUM 1 % EX GEL
2.0000 g | Freq: Four times a day (QID) | CUTANEOUS | Status: DC
  Administered 2020-03-19 – 2020-03-20 (×4): 2 g via TOPICAL
  Filled 2020-03-19: qty 100

## 2020-03-19 MED ORDER — FAMOTIDINE 20 MG PO TABS: 20.0000 mg | ORAL_TABLET | Freq: Two times a day (BID) | ORAL | Status: DC | PRN

## 2020-03-19 MED ORDER — GABAPENTIN 600 MG PO TABS
300.0000 mg | ORAL_TABLET | Freq: Every day | ORAL | Status: DC
  Administered 2020-03-19: 300 mg via ORAL
  Filled 2020-03-19: qty 1

## 2020-03-19 NOTE — Care Management Obs Status (Signed)
[facility] NOTIFICATION   Patient Details  Name: Joe Coleman MRN: [id number] Date of Birth: Dec 05, 1945   Medicare Observation Status Notification Given:  Yes (MOON) Read and explained form to patient. He has some literacy issues so reading it to him was appropriate and his is on isolation. He stated provider was going to admit him. Form printed to unit and assigned RN asked to deliver to his room. Simmie Davies RN CM    Izola Price, RN 03/19/2020, 9:23 AM

## 2020-03-19 NOTE — Progress Notes (Signed)
Mobility Specialist - Progress Note   03/19/20 1000  Mobility  Activity Contraindicated/medical hold  Mobility performed by Mobility specialist    Per chart review, pt with elevated K+ of 5.3 this date, sitting outside safety guidelines for exertional activity. Will attempt session another date/time as appropriate.    Kathee Delton Mobility Specialist 03/19/20, 10:13 AM

## 2020-03-19 NOTE — Progress Notes (Addendum)
PROGRESS NOTE    Joe Coleman  IRW:[id number] DOB: 1945-11-01 DOA: 03/17/2020 PCP: Maryann Conners, MD   Assessment & Plan:   Principal Problem:   AKI (acute kidney injury) ([place]) Active Problems:   COVID-19 virus infection   COPD with acute exacerbation ([place])   Diabetes ([place])   HTN (hypertension)    AKI: possibly secondary to decreased po intake. Baseline Cr around 1.1. Cr is trending down from day prior.   COVID-19 infection: pt c/o abd pain & shortness of breath. CXR shows no active disease. Continue on po steroids, bronchodilators and encourage incentive spirometry  Hyperkalemia: lokelma x 1. Repeat K level ordered.   Leukopenia: resolved   Thrombocytopenia: resolved   HTN: restart home dose of losartan tomorrow if Cr is trending down   DM2: will continue on SSI w/ accuchecks. Likely poorly controlled    DVT prophylaxis: lovenox  Code Status: full Family Communication:  Disposition Plan: likely d/c back home   Level of care: Med-Surg   Status is: Observation  The patient remains OBS appropriate and will d/c before 2 midnights.  Dispo: The patient is from: Home              Anticipated d/c is to: Home              Anticipated d/c date is: 1 day              Patient currently is not medically stable to d/c.   Difficult to place patient No       Consultants:      Procedures:    Antimicrobials:    Subjective: Pt c/o abd pain and shortness of breath that is improved from day prior   Objective: Vitals:   03/18/20 1605 03/18/20 2115 03/19/20 0119 03/19/20 0619  BP: 126/72 138/73 (!) 158/87 136/74  Pulse: 68 71 77 71  Resp: 20 (!) 21  17  Temp: 97.9 F (36.6 C) 98.1 F (36.7 C) (!) 97.5 F (36.4 C) 97.7 F (36.5 C)  TempSrc:    Oral  SpO2: 98% 97% 98% 95%  Weight:      Height:        Intake/Output Summary (Last 24 hours) at 03/19/2020 0743 Last data filed at 03/19/2020 0119 Gross per 24 hour  Intake 3 ml  Output 1175 ml   Net -1172 ml   Filed Weights   03/17/20 1410  Weight: 74.8 kg    Examination:  General exam: Appears calm but uncomfortable  Respiratory system: decreased breath sounds b/l  Cardiovascular system: S1/S2+. No clicks or rubs  Gastrointestinal system: Abd is soft, tenderness to palpation, ND & hypoactive bowel sounds Central nervous system: Alert and oriented. Moves all 4 extremities  Psychiatry: Judgement and insight appear normal. Flat mood and affect.     Data Reviewed: I have personally reviewed following labs and imaging studies  CBC: Recent Labs  Lab 03/17/20 1414 03/18/20 0448 03/19/20 0513  WBC 4.3 2.9* 11.8*  HGB 11.7* 10.0* 10.2*  HCT 38.1* 31.7* 31.8*  MCV 86.6 85.4 84.8  PLT 183 144* 761   Basic Metabolic Panel: Recent Labs  Lab 03/17/20 1720 03/18/20 0448 03/19/20 0513  NA 136 136 139  K 4.6 4.7 5.3*  CL 104 106 108  CO2 25 19* 26  GLUCOSE 131* 316* 231*  BUN 29* 28* 29*  CREATININE 1.52* 1.63* 1.39*  CALCIUM 8.6* 8.0* 8.8*   GFR: Estimated Creatinine Clearance: 43.7 mL/min (A) (by C-G formula based  on SCr of 1.39 mg/dL (H)). Liver Function Tests: No results for input(s): AST, ALT, ALKPHOS, BILITOT, PROT, ALBUMIN in the last 168 hours. No results for input(s): LIPASE, AMYLASE in the last 168 hours. No results for input(s): AMMONIA in the last 168 hours. Coagulation Profile: No results for input(s): INR, PROTIME in the last 168 hours. Cardiac Enzymes: No results for input(s): CKTOTAL, CKMB, CKMBINDEX, TROPONINI in the last 168 hours. BNP (last 3 results) No results for input(s): PROBNP in the last 8760 hours. HbA1C: No results for input(s): HGBA1C in the last 72 hours. CBG: Recent Labs  Lab 03/17/20 2348 03/18/20 0927 03/18/20 1159 03/18/20 1621 03/18/20 2153  GLUCAP 329* 246* 285* 145* 195*   Lipid Profile: No results for input(s): CHOL, HDL, LDLCALC, TRIG, CHOLHDL, LDLDIRECT in the last 72 hours. Thyroid Function Tests: No results  for input(s): TSH, T4TOTAL, FREET4, T3FREE, THYROIDAB in the last 72 hours. Anemia Panel: No results for input(s): VITAMINB12, FOLATE, FERRITIN, TIBC, IRON, RETICCTPCT in the last 72 hours. Sepsis Labs: No results for input(s): PROCALCITON, LATICACIDVEN in the last 168 hours.  No results found for this or any previous visit (from the past 240 hour(s)).       Radiology Studies: DG Chest 2 View  Result Date: 03/17/2020 CLINICAL DATA:  Pt arrived via ems from home. Pt reports testing covid + on 2/8. C/o SOB, and chest pain since Tuesday. Pt was seen at PCP on Tuesday and given two medications but pt unsure of names. Hx of HTN and diabetes. EXAM: CHEST - 2 VIEW COMPARISON:  04/01/2018 FINDINGS: Cardiac silhouette is normal in size and configuration. Normal mediastinal and hilar contours. Lungs mildly hyperexpanded, but clear. No pleural effusion or pneumothorax. Stable surgical vascular clips in the epigastric region of the upper abdomen. Skeletal structures are intact. IMPRESSION: No active cardiopulmonary disease. Electronically Signed   By: Lajean Manes M.D.   On: 03/17/2020 14:42   CT Angio Chest PE W and/or Wo Contrast  Result Date: 03/17/2020 CLINICAL DATA:  COVID positive with shortness of breath. EXAM: CT ANGIOGRAPHY CHEST WITH CONTRAST TECHNIQUE: Multidetector CT imaging of the chest was performed using the standard protocol during bolus administration of intravenous contrast. Multiplanar CT image reconstructions and MIPs were obtained to evaluate the vascular anatomy. CONTRAST:  81mL OMNIPAQUE IOHEXOL 350 MG/ML SOLN COMPARISON:  April 01, 2016 FINDINGS: Cardiovascular: There is mild calcification of the aortic arch and descending thoracic aorta. Satisfactory opacification of the pulmonary arteries to the segmental level. No evidence of pulmonary embolism. Normal heart size. No pericardial effusion. Mediastinum/Nodes: No enlarged mediastinal, hilar, or axillary lymph nodes. Thyroid gland,  trachea, and esophagus demonstrate no significant findings. Lungs/Pleura: Mild, stable scarring and/or atelectasis is seen within the medial aspect of the right middle lobe and posterior aspect of the left lung base. There is no evidence of acute infiltrate, pleural effusion or pneumothorax. Upper Abdomen: Multiple surgical clips are seen along the esophageal hiatus. Numerous subcentimeter gallstones are seen within the lumen of an otherwise normal-appearing gallbladder. Musculoskeletal: Multilevel degenerative changes seen throughout the thoracic spine. Review of the MIP images confirms the above findings. IMPRESSION: 1. No CT evidence of pulmonary embolism or acute cardiopulmonary disease. 2. Cholelithiasis. 3. Aortic atherosclerosis. Aortic Atherosclerosis (ICD10-I70.0). Electronically Signed   By: Virgina Norfolk M.D.   On: 03/17/2020 19:13        Scheduled Meds: . aspirin EC  81 mg Oral Daily  . enoxaparin (LOVENOX) injection  40 mg Subcutaneous Q24H  . insulin aspart  0-9 Units Subcutaneous TID WC  . Ipratropium-Albuterol  1 puff Inhalation Q6H WA  . melatonin  5 mg Oral QHS  . predniSONE  40 mg Oral Q breakfast  . sodium chloride flush  3 mL Intravenous Q12H  . sodium zirconium cyclosilicate  10 g Oral Once   Continuous Infusions:    LOS: 0 days    Time spent: 33 mins     Wyvonnia Dusky, MD [facility] Pager [phone]  If 7PM-7AM, please contact night-coverage 03/19/2020, 7:43 AM

## 2020-03-20 DIAGNOSIS — U071 COVID-19: Secondary | ICD-10-CM | POA: Diagnosis not present

## 2020-03-20 DIAGNOSIS — N179 Acute kidney failure, unspecified: Secondary | ICD-10-CM | POA: Diagnosis not present

## 2020-03-20 DIAGNOSIS — E1169 Type 2 diabetes mellitus with other specified complication: Secondary | ICD-10-CM

## 2020-03-20 LAB — BASIC METABOLIC PANEL
Anion gap: 5 (ref 5–15)
BUN: 41 mg/dL — ABNORMAL HIGH (ref 8–23)
CO2: 25 mmol/L (ref 22–32)
Calcium: 8.5 mg/dL — ABNORMAL LOW (ref 8.9–10.3)
Chloride: 105 mmol/L (ref 98–111)
Creatinine, Ser: 1.5 mg/dL — ABNORMAL HIGH (ref 0.61–1.24)
GFR, Estimated: 48 mL/min — ABNORMAL LOW (ref >60)
Glucose, Bld: 304 mg/dL — ABNORMAL HIGH (ref 70–99)
Potassium: 4.4 mmol/L (ref 3.5–5.1)
Sodium: 135 mmol/L (ref 135–145)

## 2020-03-20 LAB — CBC
HCT: 30.6 % — ABNORMAL LOW (ref 39.0–52.0)
Hemoglobin: 9.7 g/dL — ABNORMAL LOW (ref 13.0–17.0)
MCH: 26.9 pg (ref 26.0–34.0)
MCHC: 31.7 g/dL (ref 30.0–36.0)
MCV: 84.8 fL (ref 80.0–100.0)
Platelets: 165 10*3/uL (ref 150–400)
RBC: 3.61 MIL/uL — ABNORMAL LOW (ref 4.22–5.81)
RDW: 14.5 % (ref 11.5–15.5)
WBC: 11.3 10*3/uL — ABNORMAL HIGH (ref 4.0–10.5)
nRBC: 0 % (ref 0.0–0.2)

## 2020-03-20 LAB — GLUCOSE, CAPILLARY
Glucose-Capillary: 313 mg/dL — ABNORMAL HIGH (ref 70–99)
Glucose-Capillary: 232 mg/dL — ABNORMAL HIGH (ref 70–99)

## 2020-03-20 MED ORDER — PANTOPRAZOLE SODIUM 40 MG PO TBEC: 40.0000 mg | DELAYED_RELEASE_TABLET | Freq: Every day | ORAL | 0 refills | Status: DC

## 2020-03-20 MED ORDER — GUAIFENESIN ER 600 MG PO TB12
600.0000 mg | ORAL_TABLET | Freq: Two times a day (BID) | ORAL | Status: DC
  Administered 2020-03-20: 10:00:00 600 mg via ORAL
  Filled 2020-03-20: qty 1

## 2020-03-20 MED ORDER — PREDNISONE 20 MG PO TABS: 40.0000 mg | ORAL_TABLET | Freq: Every day | ORAL | 0 refills | Status: AC

## 2020-03-20 NOTE — Discharge Summary (Signed)
Physician Discharge Summary  JANDEL PATRIARCA PPJ:[id number] DOB: 06/13/1945 DOA: 03/17/2020  PCP: Maryann Conners, MD  Admit date: 03/17/2020 Discharge date: 03/20/2020  Admitted From: home Disposition: home  Recommendations for Outpatient Follow-up:  1. Follow up with PCP in 1 week  Home Health: no  Equipment/Devices:   Discharge Condition: stable  CODE STATUS: full  Diet recommendation: Heart Healthy / Carb Modified  Brief/Interim Summary: HPI was taken from Dr. Trilby Drummer: Joe Coleman is a 75 y.o. male with medical history significant of stomach and colon cancer, hypertension, diabetes who presents with some progressive shortness of breath.             Patient states he was diagnosed with Covid on 2/8 and has had some progressive shortness of breath since that time.  Also reports fatigue and wheezing.  States he was started on an oral antiviral for Covid by his outpatient provider, but he does not remember the name of it. He also reports some intermittent chest tightness and decreased appetite. He has been prescribed an albuterol inhaler for years and he takes it as needed but he is not sure what it was prescribed for. He denies fever, chills, cough, chest pain, abdominal pain, nausea, vomiting, constipation, diarrhea.  ED Course: Vital signs in the ED significant for respiratory rate in the 20s to 30s, heart rate in the 90s to 100s, blood pressure in the 580D to 983J systolic.  Reportedly with ambulation he became more tachypneic and tachycardic.  He has continued to saturate well on room air however.  Lab work-up showed BMP with creatinine elevated to 1.5 to increase from baseline 3 years ago 1.1, glucose 131.  CBC showed hemoglobin of 11.7 which is stable from previous of 3 years ago.  Troponin negative x2, D-dimer elevated to 875.  Imaging showed chest x-ray with hyperexpanded lungs but no acute disease.  CTA chest was without signs of PE nor acute pulmonary disease.  Patient  was given 2 L of IV fluids, duo nebs, Solu-Medrol in the ED.  Hospital course from Dr. Jimmye Norman 2/12-2/14/22: Pt presented w/ shortness of breath and was found to have [place]. Pt was treated w/ steroids, bronchodilators and incentive spirometry. Pt did not require supplemental oxygen while inpatient. Also, pt was found to have AKI which was likely secondary to decreased po intake. Pt's Cr was labile. PT/OT evaluated the pt and did not recommend any f/u. For more information, please see other progress notes.   Discharge Diagnoses:  Principal Problem:   AKI (acute kidney injury) ([place]) Active Problems:   COVID-19 virus infection   COPD with acute exacerbation ([place])   Diabetes ([place])   HTN (hypertension)   AKI: possibly secondary to decreased po intake. Baseline Cr around 1.1. Cr is labile.   COVID-19 infection: pt c/o abd pain & shortness of breath. CXR shows no active disease. Continue on po steroids, bronchodilators and encourage incentive spirometry  Hyperkalemia: resolved    Leukopenia: resolved   Thrombocytopenia: resolved   HTN: restart home dose of losartan   DM2: will continue on SSI w/ accuchecks. Likely poorly controlled    Discharge Instructions  Discharge Instructions    Diet - low sodium heart healthy   Complete by: As directed    Diet Carb Modified   Complete by: As directed    Discharge instructions   Complete by: As directed    F/u w/ PCP in 1 week   Increase activity slowly   Complete by: As directed  Allergies as of 03/20/2020      Reactions   Chicken Allergy Other (See Comments)   Other reaction(s): VOMITING Other reaction(s): VOMITING   Losartan    Hyperkalemia (>5.5 multiple times on losartan 25mg )   Poultry Meal Nausea And Vomiting      Medication List    TAKE these medications   albuterol 108 (90 Base) MCG/ACT inhaler Commonly known as: VENTOLIN HFA Inhale 2 puffs into the lungs every 6 (six) hours as needed for wheezing or  shortness of breath.   amLODipine 5 MG tablet Commonly known as: NORVASC Take 5 mg by mouth daily.   aspirin EC 81 MG tablet Take 81 mg by mouth daily.   gabapentin 300 MG capsule Commonly known as: NEURONTIN Take 300 mg by mouth at bedtime.   metFORMIN 500 MG tablet Commonly known as: GLUCOPHAGE Take by mouth 2 (two) times daily with a meal.   pantoprazole 40 MG tablet Commonly known as: Protonix Take 1 tablet (40 mg total) by mouth daily.   Paxlovid 20 x 150 MG & 10 x 100MG  Tbpk Generic drug: Nirmatrelvir & Ritonavir Take 1 tablet by mouth as directed.   predniSONE 20 MG tablet Commonly known as: DELTASONE Take 2 tablets (40 mg total) by mouth daily for 5 days.       Allergies  Allergen Reactions  . Chicken Allergy Other (See Comments)    Other reaction(s): VOMITING Other reaction(s): VOMITING   . Losartan     Hyperkalemia (>5.5 multiple times on losartan 25mg )  . Poultry Meal Nausea And Vomiting    Consultations:     Procedures/Studies: DG Chest 2 View  Result Date: 03/17/2020 CLINICAL DATA:  Pt arrived via ems from home. Pt reports testing covid + on 2/8. C/o SOB, and chest pain since Tuesday. Pt was seen at PCP on Tuesday and given two medications but pt unsure of names. Hx of HTN and diabetes. EXAM: CHEST - 2 VIEW COMPARISON:  04/01/2018 FINDINGS: Cardiac silhouette is normal in size and configuration. Normal mediastinal and hilar contours. Lungs mildly hyperexpanded, but clear. No pleural effusion or pneumothorax. Stable surgical vascular clips in the epigastric region of the upper abdomen. Skeletal structures are intact. IMPRESSION: No active cardiopulmonary disease. Electronically Signed   By: Lajean Manes M.D.   On: 03/17/2020 14:42   CT Angio Chest PE W and/or Wo Contrast  Result Date: 03/17/2020 CLINICAL DATA:  COVID positive with shortness of breath. EXAM: CT ANGIOGRAPHY CHEST WITH CONTRAST TECHNIQUE: Multidetector CT imaging of the chest was  performed using the standard protocol during bolus administration of intravenous contrast. Multiplanar CT image reconstructions and MIPs were obtained to evaluate the vascular anatomy. CONTRAST:  74mL OMNIPAQUE IOHEXOL 350 MG/ML SOLN COMPARISON:  April 01, 2016 FINDINGS: Cardiovascular: There is mild calcification of the aortic arch and descending thoracic aorta. Satisfactory opacification of the pulmonary arteries to the segmental level. No evidence of pulmonary embolism. Normal heart size. No pericardial effusion. Mediastinum/Nodes: No enlarged mediastinal, hilar, or axillary lymph nodes. Thyroid gland, trachea, and esophagus demonstrate no significant findings. Lungs/Pleura: Mild, stable scarring and/or atelectasis is seen within the medial aspect of the right middle lobe and posterior aspect of the left lung base. There is no evidence of acute infiltrate, pleural effusion or pneumothorax. Upper Abdomen: Multiple surgical clips are seen along the esophageal hiatus. Numerous subcentimeter gallstones are seen within the lumen of an otherwise normal-appearing gallbladder. Musculoskeletal: Multilevel degenerative changes seen throughout the thoracic spine. Review of the MIP images confirms  the above findings. IMPRESSION: 1. No CT evidence of pulmonary embolism or acute cardiopulmonary disease. 2. Cholelithiasis. 3. Aortic atherosclerosis. Aortic Atherosclerosis (ICD10-I70.0). Electronically Signed   By: Virgina Norfolk M.D.   On: 03/17/2020 19:13      Subjective: Pt c/o fatigue   Discharge Exam: Vitals:   03/20/20 1235 03/20/20 1240  BP:    Pulse:    Resp:    Temp:    SpO2: (!) 89% 95%   Vitals:   03/20/20 1002 03/20/20 1205 03/20/20 1235 03/20/20 1240  BP: 133/61 (!) 150/66    Pulse: 75 69    Resp:  16    Temp:  98.4 F (36.9 C)    TempSrc:  Oral    SpO2:  95% (!) 89% 95%  Weight:      Height:        General: Pt is alert, awake, not in acute distress Cardiovascular:  S1/S2 +, no  rubs, no gallops Respiratory: CTA bilaterally, no wheezing, no rhonchi Abdominal: Soft, NT, ND, bowel sounds + Extremities: no edema, no cyanosis    The results of significant diagnostics from this hospitalization (including imaging, microbiology, ancillary and laboratory) are listed below for reference.     Microbiology: No results found for this or any previous visit (from the past 240 hour(s)).   Labs: BNP (last 3 results) No results for input(s): BNP in the last 8760 hours. Basic Metabolic Panel: Recent Labs  Lab 03/17/20 1720 03/18/20 0448 03/19/20 0513 03/19/20 1447 03/20/20 0500  NA 136 136 139  --  135  K 4.6 4.7 5.3* 4.4 4.4  CL 104 106 108  --  105  CO2 25 19* 26  --  25  GLUCOSE 131* 316* 231*  --  304*  BUN 29* 28* 29*  --  41*  CREATININE 1.52* 1.63* 1.39*  --  1.50*  CALCIUM 8.6* 8.0* 8.8*  --  8.5*   Liver Function Tests: No results for input(s): AST, ALT, ALKPHOS, BILITOT, PROT, ALBUMIN in the last 168 hours. No results for input(s): LIPASE, AMYLASE in the last 168 hours. No results for input(s): AMMONIA in the last 168 hours. CBC: Recent Labs  Lab 03/17/20 1414 03/18/20 0448 03/19/20 0513 03/20/20 0500  WBC 4.3 2.9* 11.8* 11.3*  HGB 11.7* 10.0* 10.2* 9.7*  HCT 38.1* 31.7* 31.8* 30.6*  MCV 86.6 85.4 84.8 84.8  PLT 183 144* 163 165   Cardiac Enzymes: No results for input(s): CKTOTAL, CKMB, CKMBINDEX, TROPONINI in the last 168 hours. BNP: Invalid input(s): POCBNP CBG: Recent Labs  Lab 03/19/20 1239 03/19/20 1800 03/19/20 2129 03/20/20 0918 03/20/20 1207  GLUCAP 279* 234* 227* 313* 232*   D-Dimer No results for input(s): DDIMER in the last 72 hours. Hgb A1c No results for input(s): HGBA1C in the last 72 hours. Lipid Profile No results for input(s): CHOL, HDL, LDLCALC, TRIG, CHOLHDL, LDLDIRECT in the last 72 hours. Thyroid function studies No results for input(s): TSH, T4TOTAL, T3FREE, THYROIDAB in the last 72 hours.  Invalid  input(s): FREET3 Anemia work up No results for input(s): VITAMINB12, FOLATE, FERRITIN, TIBC, IRON, RETICCTPCT in the last 72 hours. Urinalysis No results found for: COLORURINE, APPEARANCEUR, LABSPEC, [place], GLUCOSEU, HGBUR, BILIRUBINUR, KETONESUR, PROTEINUR, UROBILINOGEN, NITRITE, LEUKOCYTESUR Sepsis Labs Invalid input(s): PROCALCITONIN,  WBC,  LACTICIDVEN Microbiology No results found for this or any previous visit (from the past 240 hour(s)).   Time coordinating discharge: Over 30 minutes  SIGNED:   Wyvonnia Dusky, MD  [facility] 03/20/2020, 6:02 PM  Pager   If 7PM-7AM, please contact night-coverage

## 2020-03-20 NOTE — Progress Notes (Signed)
SATURATION QUALIFICATIONS: (This note is used to comply with regulatory documentation for home oxygen)  Patient Saturations on Room Air at Rest = 96%  Patient Saturations on Room Air while Ambulating=89%   The lowest patient o2 saturation got while ambulating was 89%, but patient actually recovered to 91-92%  as he continued to ambulate.

## 2020-03-20 NOTE — Progress Notes (Signed)
Received MD order to discharge patient to home, reviewed discharge instructions, homes meds, follow up appointments and prescriptions with patient and patient verbalized understanding.

## 2021-07-18 ENCOUNTER — Other Ambulatory Visit: Payer: Self-pay

## 2021-07-18 ENCOUNTER — Emergency Department
Admission: EM | Admit: 2021-07-18 | Discharge: 2021-07-18 | Disposition: A | Discharge status: Home / Self Care | Payer: 59 | Attending: Emergency Medicine | Admitting: Emergency Medicine

## 2021-07-18 ENCOUNTER — Encounter: Payer: Self-pay | Admitting: Emergency Medicine

## 2021-07-18 DIAGNOSIS — Z8616 Personal history of COVID-19: Secondary | ICD-10-CM | POA: Insufficient documentation

## 2021-07-18 DIAGNOSIS — I10 Essential (primary) hypertension: Secondary | ICD-10-CM | POA: Diagnosis not present

## 2021-07-18 DIAGNOSIS — J449 Chronic obstructive pulmonary disease, unspecified: Secondary | ICD-10-CM | POA: Diagnosis not present

## 2021-07-18 DIAGNOSIS — E119 Type 2 diabetes mellitus without complications: Secondary | ICD-10-CM | POA: Insufficient documentation

## 2021-07-18 DIAGNOSIS — S60862A Insect bite (nonvenomous) of left wrist, initial encounter: Secondary | ICD-10-CM | POA: Diagnosis not present

## 2021-07-18 DIAGNOSIS — W57XXXA Bitten or stung by nonvenomous insect and other nonvenomous arthropods, initial encounter: Secondary | ICD-10-CM | POA: Insufficient documentation

## 2021-07-18 DIAGNOSIS — S6992XA Unspecified injury of left wrist, hand and finger(s), initial encounter: Secondary | ICD-10-CM | POA: Diagnosis present

## 2021-07-18 NOTE — Discharge Instructions (Addendum)
Please return for redness, swelling, pain, red streak up your arm, fever, chills, or any other concerns.  Please see your primary care provider in 2 days for wound recheck.

## 2021-07-18 NOTE — ED Provider Notes (Signed)
[facility] Provider Note    Event Date/Time   First MD Initiated Contact with Patient 07/18/21 2109     (approximate)   History   Insect Bite   HPI  Joe Coleman is a 76 y.o. male with a past medical history of COPD, diabetes, hypertension who presents today for insect bite.  Patient reports that he saw the spider bite him.  It was a spider.  He reports that it was very itchy so he scratched it and put rubbing alcohol on it.  Now he reports that it feels normal, he does not have any pain or itchiness, but he just wanted it to be checked out.  He denies numbness or tingling.  No swelling.  No fevers or chills.  Denies tick bite.  Patient Active Problem List   Diagnosis Date Noted   AKI (acute kidney injury) ([place]) 03/17/2020   COVID-19 virus infection 03/17/2020   COPD with acute exacerbation ([place]) 03/17/2020   Diabetes ([place]) 03/17/2020   HTN (hypertension) 03/17/2020          Physical Exam   Triage Vital Signs: ED Triage Vitals  Enc Vitals Group     BP 07/18/21 1957 (!) 156/68     Pulse Rate 07/18/21 1957 80     Resp 07/18/21 1957 18     Temp 07/18/21 1957 97.7 F (36.5 C)     Temp Source 07/18/21 1957 Oral     SpO2 07/18/21 1957 98 %     Weight 07/18/21 1922 165 lb (74.8 kg)     Height 07/18/21 1922 '5\' 8"'$  (1.727 m)     Head Circumference --      Peak Flow --      Pain Score 07/18/21 1922 0     Pain Loc --      Pain Edu? --      Excl. in [place]? --     Most recent vital signs: Vitals:   07/18/21 1957  BP: (!) 156/68  Pulse: 80  Resp: 18  Temp: 97.7 F (36.5 C)  SpO2: 98%    Physical Exam Vitals and nursing note reviewed.  Constitutional:      General: Awake and alert. No acute distress.    Appearance: Normal appearance. He is well-developed and normal weight.  HENT:     Head: Normocephalic and atraumatic.     Mouth/Throat:     Mouth: Mucous membranes are moist.  Eyes:     General: PERRL. Normal EOMs        Right eye:  No discharge.        Left eye: No discharge.     Conjunctiva/sclera: Conjunctivae normal.  Cardiovascular:     Rate and Rhythm: Normal rate and regular rhythm.     Pulses: Normal pulses.     Heart sounds: Normal heart sounds Pulmonary:     Effort: Pulmonary effort is normal. No respiratory distress.     Breath sounds: Normal breath sounds.  Abdominal:     Abdomen is soft. There is no abdominal tenderness. No rebound or guarding. No distention. Musculoskeletal:        General: No swelling. Normal range of motion.     Cervical back: Normal range of motion and neck supple.  Lymphadenopathy:     Cervical: No cervical adenopathy.  Skin:    General: Skin is warm and dry.     Capillary Refill: Capillary refill takes less than 2 seconds.     Findings: No  rash.  Area of insect bite to left wrist, palmar aspect with no appreciable abnormalities.  Small puncture wound without surrounding erythema.  No induration or swelling.  Normal range of motion of wrist.  Normal intrinsic muscle function of the hand.  Normal radial pulse.  No warmth or crepitus.  Unremarkable wrist. Neurological:     Mental Status: He is alert.        ED Results / Procedures / Treatments   Labs (all labs ordered are listed, but only abnormal results are displayed) Labs Reviewed - No data to display   EKG     RADIOLOGY     PROCEDURES:  Critical Care performed:   Procedures   MEDICATIONS ORDERED IN ED: Medications - No data to display   IMPRESSION / MDM / [place] / ED COURSE  I reviewed the triage vital signs and the nursing notes.   Differential diagnosis includes, but is not limited to, insect bite with localized reaction.  There is no evidence of infection.  There is no erythema, warmth, swelling, lymphangitis, fever, chills.  I did discuss with patient that he is higher risk for developing infection given that he is diabetic.  However currently there is no evidence of compartment  syndrome or developing infection.  Advised wound check in 2 days.  Discussed wound care and return precautions.  Patient understands and agrees with plan.  He was discharged in stable condition.   Patient's presentation is most consistent with acute, uncomplicated illness.      FINAL CLINICAL IMPRESSION(S) / ED DIAGNOSES   Final diagnoses:  Insect bite of left wrist, initial encounter     Rx / DC Orders   ED Discharge Orders     None        Note:  This document was prepared using Dragon voice recognition software and may include unintentional dictation errors.   Emeline Gins 07/18/21 2142    Rada Hay, MD 07/18/21 2325

## 2021-07-18 NOTE — ED Notes (Signed)
Pt A&O, pt given discharge instructions, pt ambulating with steady gait. 

## 2021-07-18 NOTE — ED Triage Notes (Signed)
Patient ambulatory to triage with steady gait, without difficulty or distress noted; pt reports insect bite to left wrist PTA; denies pain; small area of redness noted

## 2021-08-03 ENCOUNTER — Emergency Department: Payer: 59

## 2021-08-03 ENCOUNTER — Other Ambulatory Visit: Payer: Self-pay

## 2021-08-03 DIAGNOSIS — Z20822 Contact with and (suspected) exposure to covid-19: Secondary | ICD-10-CM | POA: Diagnosis not present

## 2021-08-03 DIAGNOSIS — R0602 Shortness of breath: Secondary | ICD-10-CM | POA: Diagnosis not present

## 2021-08-03 DIAGNOSIS — R0981 Nasal congestion: Secondary | ICD-10-CM | POA: Insufficient documentation

## 2021-08-03 DIAGNOSIS — Z5321 Procedure and treatment not carried out due to patient leaving prior to being seen by health care provider: Secondary | ICD-10-CM | POA: Diagnosis not present

## 2021-08-03 DIAGNOSIS — R059 Cough, unspecified: Secondary | ICD-10-CM | POA: Diagnosis present

## 2021-08-03 LAB — CBC WITH DIFFERENTIAL/PLATELET
Abs Immature Granulocytes: 0.05 10*3/uL (ref 0.00–0.07)
Basophils Absolute: 0.1 10*3/uL (ref 0.0–0.1)
Basophils Relative: 1 %
Eosinophils Absolute: 0.6 10*3/uL — ABNORMAL HIGH (ref 0.0–0.5)
Eosinophils Relative: 7 %
HCT: 35.6 % — ABNORMAL LOW (ref 39.0–52.0)
Hemoglobin: 10.7 g/dL — ABNORMAL LOW (ref 13.0–17.0)
Immature Granulocytes: 1 %
Lymphocytes Relative: 25 %
Lymphs Abs: 2 10*3/uL (ref 0.7–4.0)
MCH: 26.2 pg (ref 26.0–34.0)
MCHC: 30.1 g/dL (ref 30.0–36.0)
MCV: 87 fL (ref 80.0–100.0)
Monocytes Absolute: 0.7 10*3/uL (ref 0.1–1.0)
Monocytes Relative: 9 %
Neutro Abs: 4.6 10*3/uL (ref 1.7–7.7)
Neutrophils Relative %: 57 %
Platelets: 220 10*3/uL (ref 150–400)
RBC: 4.09 MIL/uL — ABNORMAL LOW (ref 4.22–5.81)
RDW: 14.3 % (ref 11.5–15.5)
WBC: 8 10*3/uL (ref 4.0–10.5)
nRBC: 0 % (ref 0.0–0.2)

## 2021-08-03 LAB — COMPREHENSIVE METABOLIC PANEL
ALT: 11 U/L (ref 0–44)
AST: 18 U/L (ref 15–41)
Albumin: 4 g/dL (ref 3.5–5.0)
Alkaline Phosphatase: 130 U/L — ABNORMAL HIGH (ref 38–126)
Anion gap: 7 (ref 5–15)
BUN: 29 mg/dL — ABNORMAL HIGH (ref 8–23)
CO2: 24 mmol/L (ref 22–32)
Calcium: 8.9 mg/dL (ref 8.9–10.3)
Chloride: 104 mmol/L (ref 98–111)
Creatinine, Ser: 1.89 mg/dL — ABNORMAL HIGH (ref 0.61–1.24)
GFR, Estimated: 36 mL/min — ABNORMAL LOW (ref >60)
Glucose, Bld: 127 mg/dL — ABNORMAL HIGH (ref 70–99)
Potassium: 5 mmol/L (ref 3.5–5.1)
Sodium: 135 mmol/L (ref 135–145)
Total Bilirubin: 0.6 mg/dL (ref 0.3–1.2)
Total Protein: 7.5 g/dL (ref 6.5–8.1)

## 2021-08-03 LAB — SARS CORONAVIRUS 2 BY RT PCR: SARS Coronavirus 2 by RT PCR: NEGATIVE

## 2021-08-03 LAB — TROPONIN I (HIGH SENSITIVITY): Troponin I (High Sensitivity): 5 ng/L (ref <18)

## 2021-08-03 NOTE — ED Triage Notes (Signed)
Pt states he has had a cough for about 3 weeks. Reports feeling short of breath. He also stated that he has a runny nose starting today

## 2021-08-04 ENCOUNTER — Emergency Department
Admission: EM | Admit: 2021-08-04 | Discharge: 2021-08-04 | Discharge status: Against Medical Advice | Payer: 59 | Attending: Emergency Medicine | Admitting: Emergency Medicine

## 2021-08-04 DIAGNOSIS — R059 Cough, unspecified: Secondary | ICD-10-CM | POA: Diagnosis not present

## 2021-08-04 LAB — TROPONIN I (HIGH SENSITIVITY): Troponin I (High Sensitivity): 5 ng/L (ref <18)

## 2021-08-04 NOTE — ED Notes (Signed)
Informed by [place] locke, rn that pt is leaving.

## 2021-10-10 ENCOUNTER — Encounter: Payer: Self-pay | Admitting: Emergency Medicine

## 2021-10-10 ENCOUNTER — Emergency Department: Payer: Medicare Other

## 2021-10-10 ENCOUNTER — Other Ambulatory Visit: Payer: Self-pay

## 2021-10-10 ENCOUNTER — Emergency Department
Admission: EM | Admit: 2021-10-10 | Discharge: 2021-10-10 | Disposition: A | Discharge status: Home / Self Care | Payer: Medicare Other | Attending: Emergency Medicine | Admitting: Emergency Medicine

## 2021-10-10 DIAGNOSIS — R42 Dizziness and giddiness: Secondary | ICD-10-CM | POA: Diagnosis not present

## 2021-10-10 DIAGNOSIS — Z7951 Long term (current) use of inhaled steroids: Secondary | ICD-10-CM | POA: Diagnosis not present

## 2021-10-10 DIAGNOSIS — E119 Type 2 diabetes mellitus without complications: Secondary | ICD-10-CM | POA: Insufficient documentation

## 2021-10-10 DIAGNOSIS — I10 Essential (primary) hypertension: Secondary | ICD-10-CM | POA: Insufficient documentation

## 2021-10-10 DIAGNOSIS — J441 Chronic obstructive pulmonary disease with (acute) exacerbation: Secondary | ICD-10-CM | POA: Diagnosis not present

## 2021-10-10 DIAGNOSIS — R059 Cough, unspecified: Secondary | ICD-10-CM | POA: Diagnosis present

## 2021-10-10 LAB — CBC
HCT: 34.8 % — ABNORMAL LOW (ref 39.0–52.0)
Hemoglobin: 10.5 g/dL — ABNORMAL LOW (ref 13.0–17.0)
MCH: 26.7 pg (ref 26.0–34.0)
MCHC: 30.2 g/dL (ref 30.0–36.0)
MCV: 88.5 fL (ref 80.0–100.0)
Platelets: 234 10*3/uL (ref 150–400)
RBC: 3.93 MIL/uL — ABNORMAL LOW (ref 4.22–5.81)
RDW: 14.6 % (ref 11.5–15.5)
WBC: 9.8 10*3/uL (ref 4.0–10.5)
nRBC: 0 % (ref 0.0–0.2)

## 2021-10-10 LAB — BASIC METABOLIC PANEL
Anion gap: 11 (ref 5–15)
BUN: 24 mg/dL — ABNORMAL HIGH (ref 8–23)
CO2: 21 mmol/L — ABNORMAL LOW (ref 22–32)
Calcium: 8.7 mg/dL — ABNORMAL LOW (ref 8.9–10.3)
Chloride: 103 mmol/L (ref 98–111)
Creatinine, Ser: 1.69 mg/dL — ABNORMAL HIGH (ref 0.61–1.24)
GFR, Estimated: 41 mL/min — ABNORMAL LOW (ref >60)
Glucose, Bld: 193 mg/dL — ABNORMAL HIGH (ref 70–99)
Potassium: 4.4 mmol/L (ref 3.5–5.1)
Sodium: 135 mmol/L (ref 135–145)

## 2021-10-10 LAB — URINALYSIS, ROUTINE W REFLEX MICROSCOPIC
Bacteria, UA: NONE SEEN
Bilirubin Urine: NEGATIVE
Glucose, UA: 50 mg/dL — AB
Hgb urine dipstick: NEGATIVE
Ketones, ur: NEGATIVE mg/dL
Leukocytes,Ua: NEGATIVE
Nitrite: NEGATIVE
Protein, ur: 30 mg/dL — AB
Specific Gravity, Urine: 1.015 (ref 1.005–1.030)
Squamous Epithelial / HPF: NONE SEEN (ref 0–5)
pH: 5 (ref 5.0–8.0)

## 2021-10-10 MED ORDER — AMOXICILLIN-POT CLAVULANATE 875-125 MG PO TABS: 1.0000 | ORAL_TABLET | Freq: Two times a day (BID) | ORAL | 0 refills | Status: AC

## 2021-10-10 MED ORDER — GUAIFENESIN ER 600 MG PO TB12: 600.0000 mg | ORAL_TABLET | Freq: Two times a day (BID) | ORAL | 0 refills | Status: AC

## 2021-10-10 NOTE — ED Provider Triage Note (Signed)
Emergency Medicine Provider Triage Evaluation Note  Joe Coleman , a 76 y.o. male  was evaluated in triage.  Pt complains of dizziness, fall last night.  Complaining of lower back pain.  History of dizziness but states it is worse than normal.  Patient is on a blood thinner.  No LOC.  Review of Systems  Positive: Dizziness, fall Negative: LOC  Physical Exam  BP 124/72   Pulse 91   Temp 98.1 F (36.7 C) (Oral)   Resp 16   Ht '5\' 8"'$  (1.727 m)   Wt 74.8 kg   SpO2 95%   BMI 25.09 kg/m  Gen:   Awake, no distress   Resp:  Normal effort  MSK:   Moves extremities without difficulty  Other:  Lumbar spine is tender to palpation, neurologically intact  Medical Decision Making  Medically screening exam initiated at 11:24 AM.  Appropriate orders placed.  AVERY KLINGBEIL was informed that the remainder of the evaluation will be completed by another provider, this initial triage assessment does not replace that evaluation, and the importance of remaining in the ED until their evaluation is complete.  Due to the blood thinner, will do CT of the head, C-spine and lumbar spine secondary to fall, nursing staff instructed to dizziness protocols   Versie Starks, PA-C 10/10/21 1125

## 2021-10-10 NOTE — ED Triage Notes (Signed)
Pt to ED via [facility] from home for fall last night. Pt states that he got out of bed and got dizzy and fell backwards hitting his head. Pt states that he crawled back to bed and when he got up this morning he was having back pain and is still dizzy. Pt states that he has difficulty ambulating at baseline. Pt is in NAD.

## 2021-10-10 NOTE — ED Notes (Signed)
Pt up to restroom without assistance. Pt states he is "walking like he always does" at this time. Mild sway noted.

## 2021-10-10 NOTE — ED Provider Notes (Signed)
[facility] Provider Note    Event Date/Time   First MD Initiated Contact with Patient 10/10/21 1146     (approximate)   History   Chief Complaint: Fall and Dizziness   HPI  MAN EFFERTZ is a 76 y.o. male with a past history of COPD hypertension diabetes who comes the ED complaining of unsteadiness on his feet and dizziness causing him to fall this morning and hit his head.  He also complains of feeling congested and having nonproductive cough for the last few days.  He requests a medicine to help with his cough.  He does have an albuterol inhaler at home.  He normally has difficulty with walking and balance.  He has a multipoint cane but does not use it.     Physical Exam   Triage Vital Signs: ED Triage Vitals [10/10/21 1120]  Enc Vitals Group     BP 124/72     Pulse Rate 91     Resp 16     Temp 98.1 F (36.7 C)     Temp Source Oral     SpO2 95 %     Weight 165 lb (74.8 kg)     Height '5\' 8"'$  (1.727 m)     Head Circumference      Peak Flow      Pain Score      Pain Loc      Pain Edu?      Excl. in [place]?     Most recent vital signs: Vitals:   10/10/21 1433 10/10/21 1437  BP: 131/70 120/73  Pulse: 85 92  Resp: 16 16  Temp:    SpO2: 100% 96%    General: Awake, no distress.  CV:  Good peripheral perfusion.  Regular rate and rhythm.  Normal peripheral pulses. Resp:  Normal effort.  Diffuse expiratory wheezing and mildly prolonged expiratory phase, accentuated by FEV1 maneuver. Abd:  No distention.  Soft and nontender Other:  No signs of trauma.  Neurologically intact.   ED Results / Procedures / Treatments   Labs (all labs ordered are listed, but only abnormal results are displayed) Labs Reviewed  BASIC METABOLIC PANEL - Abnormal; Notable for the following components:      Result Value   CO2 21 (*)    Glucose, Bld 193 (*)    BUN 24 (*)    Creatinine, Ser 1.69 (*)    Calcium 8.7 (*)    GFR, Estimated 41 (*)    All other  components within normal limits  CBC - Abnormal; Notable for the following components:   RBC 3.93 (*)    Hemoglobin 10.5 (*)    HCT 34.8 (*)    All other components within normal limits  URINALYSIS, ROUTINE W REFLEX MICROSCOPIC - Abnormal; Notable for the following components:   Color, Urine YELLOW (*)    APPearance CLEAR (*)    Glucose, UA 50 (*)    Protein, ur 30 (*)    All other components within normal limits  CBG MONITORING, ED     EKG Interpreted by me Normal sinus rhythm rate of 88.  Right bundle branch block.  Normal axis.  Normal ST segments and T waves.   RADIOLOGY CT head interpreted by me, negative for mass or intracranial hemorrhage.  Radiology report reviewed.  CT cervical spine and lumbar spine unremarkable.   PROCEDURES:  Procedures   MEDICATIONS ORDERED IN ED: Medications - No data to display   IMPRESSION /  MDM / ASSESSMENT AND PLAN / ED COURSE  I reviewed the triage vital signs and the nursing notes.                              Differential diagnosis includes, but is not limited to, COPD exacerbation, intracranial hemorrhage, C-spine injury, L-spine injury, anemia, dehydration, AKI, electrolyte abnormality, UTI  Patient's presentation is most consistent with acute presentation with potential threat to life or bodily function.  Patient presents with acute on chronic dizziness and gait instability, fall partially related to nonuse of his assistive device.  He also reports cough and does have some clinical evidence of COPD exacerbation.  No fever, no leukocytosis, doubt consolidation or pneumonia.  Will start on Augmentin and Mucinex, he will continue his inhalers at home.  Avoid prednisone due to his diabetes managed by oral medications only.  Not requiring admission due to reassuring vital signs exam and work-up.  He feels comfortable with discharge and outpatient management.       FINAL CLINICAL IMPRESSION(S) / ED DIAGNOSES   Final diagnoses:   COPD exacerbation ([place])     Rx / DC Orders   ED Discharge Orders          Ordered    amoxicillin-clavulanate (AUGMENTIN) 875-125 MG tablet  2 times daily        10/10/21 1526    guaiFENesin (MUCINEX) 600 MG 12 hr tablet  2 times daily        10/10/21 1526             Note:  This document was prepared using Dragon voice recognition software and may include unintentional dictation errors.   Carrie Mew, MD 10/10/21 1530

## 2021-10-10 NOTE — ED Notes (Signed)
Patient given diet ginger ale, peanut butter, and crackers

## 2022-01-19 ENCOUNTER — Other Ambulatory Visit: Payer: Self-pay

## 2022-01-19 ENCOUNTER — Emergency Department: Payer: Medicare Other

## 2022-01-19 ENCOUNTER — Emergency Department
Admission: EM | Admit: 2022-01-19 | Discharge: 2022-01-19 | Disposition: A | Discharge status: Home / Self Care | Payer: Medicare Other | Attending: Emergency Medicine | Admitting: Emergency Medicine

## 2022-01-19 DIAGNOSIS — I10 Essential (primary) hypertension: Secondary | ICD-10-CM | POA: Diagnosis not present

## 2022-01-19 DIAGNOSIS — Z85038 Personal history of other malignant neoplasm of large intestine: Secondary | ICD-10-CM | POA: Diagnosis not present

## 2022-01-19 DIAGNOSIS — Z87891 Personal history of nicotine dependence: Secondary | ICD-10-CM | POA: Diagnosis not present

## 2022-01-19 DIAGNOSIS — D649 Anemia, unspecified: Secondary | ICD-10-CM | POA: Diagnosis not present

## 2022-01-19 DIAGNOSIS — R944 Abnormal results of kidney function studies: Secondary | ICD-10-CM | POA: Insufficient documentation

## 2022-01-19 DIAGNOSIS — J441 Chronic obstructive pulmonary disease with (acute) exacerbation: Secondary | ICD-10-CM | POA: Insufficient documentation

## 2022-01-19 DIAGNOSIS — L210 Seborrhea capitis: Secondary | ICD-10-CM | POA: Insufficient documentation

## 2022-01-19 DIAGNOSIS — E119 Type 2 diabetes mellitus without complications: Secondary | ICD-10-CM | POA: Diagnosis not present

## 2022-01-19 DIAGNOSIS — E875 Hyperkalemia: Secondary | ICD-10-CM | POA: Insufficient documentation

## 2022-01-19 DIAGNOSIS — R0602 Shortness of breath: Secondary | ICD-10-CM | POA: Diagnosis present

## 2022-01-19 LAB — CBC
HCT: 36.6 % — ABNORMAL LOW (ref 39.0–52.0)
Hemoglobin: 10.7 g/dL — ABNORMAL LOW (ref 13.0–17.0)
MCH: 26 pg (ref 26.0–34.0)
MCHC: 29.2 g/dL — ABNORMAL LOW (ref 30.0–36.0)
MCV: 88.8 fL (ref 80.0–100.0)
Platelets: 226 10*3/uL (ref 150–400)
RBC: 4.12 MIL/uL — ABNORMAL LOW (ref 4.22–5.81)
RDW: 15.5 % (ref 11.5–15.5)
WBC: 7.3 10*3/uL (ref 4.0–10.5)
nRBC: 0 % (ref 0.0–0.2)

## 2022-01-19 LAB — BASIC METABOLIC PANEL
Anion gap: 7 (ref 5–15)
BUN: 22 mg/dL (ref 8–23)
CO2: 24 mmol/L (ref 22–32)
Calcium: 8.8 mg/dL — ABNORMAL LOW (ref 8.9–10.3)
Chloride: 106 mmol/L (ref 98–111)
Creatinine, Ser: 1.73 mg/dL — ABNORMAL HIGH (ref 0.61–1.24)
GFR, Estimated: 40 mL/min — ABNORMAL LOW (ref >60)
Glucose, Bld: 197 mg/dL — ABNORMAL HIGH (ref 70–99)
Potassium: 5.2 mmol/L — ABNORMAL HIGH (ref 3.5–5.1)
Sodium: 137 mmol/L (ref 135–145)

## 2022-01-19 LAB — TROPONIN I (HIGH SENSITIVITY): Troponin I (High Sensitivity): 4 ng/L (ref <18)

## 2022-01-19 MED ORDER — PYRITHIONE ZINC 1 % EX SHAM: MEDICATED_SHAMPOO | Freq: Every day | CUTANEOUS | 12 refills | Status: DC | PRN

## 2022-01-19 MED ORDER — DEXAMETHASONE 4 MG PO TABS
6.0000 mg | ORAL_TABLET | Freq: Once | ORAL | Status: AC
  Administered 2022-01-19: 6 mg via ORAL
  Filled 2022-01-19: qty 2

## 2022-01-19 MED ORDER — ALBUTEROL SULFATE HFA 108 (90 BASE) MCG/ACT IN AERS
2.0000 | INHALATION_SPRAY | Freq: Once | RESPIRATORY_TRACT | Status: AC
  Administered 2022-01-19: 2 via RESPIRATORY_TRACT
  Filled 2022-01-19: qty 6.7

## 2022-01-19 NOTE — ED Provider Notes (Signed)
[facility] Provider Note    Event Date/Time   First MD Initiated Contact with Patient 01/19/22 1442     (approximate)   History   Chest Pain and Shortness of Breath   HPI  Joe Coleman is a 76 y.o. male   Past medical history of COPD and former smoker for 45 years, colon cancer, diabetes, hypertension presents to the emergency department with wheezing and shortness of breath in the setting of a mild cough over the last 1 week.  No fevers, no chest pain, no chills.  Dry cough.  He states that he has had some white flaking in his scalp over the last 6 months and wonders if he has lice.  There is some itching as well.  He believes that a girl he had intimate relations with over the last year has had lice.  History was obtained via the patient.      Physical Exam   Triage Vital Signs: ED Triage Vitals  Enc Vitals Group     BP 01/19/22 1220 (!) 141/75     Pulse Rate 01/19/22 1220 75     Resp 01/19/22 1220 16     Temp 01/19/22 1224 98.1 F (36.7 C)     Temp Source 01/19/22 1224 Oral     SpO2 01/19/22 1220 98 %     Weight 01/19/22 1221 165 lb (74.8 kg)     Height 01/19/22 1221 '5\' 7"'$  (1.702 m)     Head Circumference --      Peak Flow --      Pain Score 01/19/22 1220 3     Pain Loc --      Pain Edu? --      Excl. in [place]? --     Most recent vital signs: Vitals:   01/19/22 1220 01/19/22 1224  BP: (!) 141/75   Pulse: 75   Resp: 16   Temp:  98.1 F (36.7 C)  SpO2: 98%     General: Awake, no distress.  CV:  Good peripheral perfusion.  Resp:  Normal effort.  Abd:  No distention.  Other:  Awake alert comfortable appearing with normal hemodynamics no hypoxemia, breathing comfortably no respiratory distress and a scant wheeze bilaterally apices without focalities rales or rhonchi.  Has hair is without evidence of nits or lice but does have some flaking white on his shirt consistent with dandruff.   ED Results / Procedures / Treatments    Labs (all labs ordered are listed, but only abnormal results are displayed) Labs Reviewed  BASIC METABOLIC PANEL - Abnormal; Notable for the following components:      Result Value   Potassium 5.2 (*)    Glucose, Bld 197 (*)    Creatinine, Ser 1.73 (*)    Calcium 8.8 (*)    GFR, Estimated 40 (*)    All other components within normal limits  CBC - Abnormal; Notable for the following components:   RBC 4.12 (*)    Hemoglobin 10.7 (*)    HCT 36.6 (*)    MCHC 29.2 (*)    All other components within normal limits  TROPONIN I (HIGH SENSITIVITY)  TROPONIN I (HIGH SENSITIVITY)     I reviewed labs and they are notable for hemoglobin is 10.7 at baseline, his potassium is 5.2 and EKG has no changes consistent with hyperkalemia.  His creatinine is elevated but at baseline.  EKG  ED ECG REPORT I, Lucillie Garfinkel, the attending physician, personally viewed  and interpreted this ECG.   Date: 01/19/2022  EKG Time: 1220  Rate: 81  Rhythm: normal sinus rhythm  Axis: nl  Intervals:none  ST&T Change: No acute ischemic changes    RADIOLOGY I independently reviewed and interpreted chest x-ray and see no obvious opacities or pneumothorax   PROCEDURES:  Critical Care performed: No  Procedures   MEDICATIONS ORDERED IN ED: Medications  albuterol (VENTOLIN HFA) 108 (90 Base) MCG/ACT inhaler 2 puff (has no administration in time range)  dexamethasone (DECADRON) tablet 6 mg (has no administration in time range)     IMPRESSION / MDM / ASSESSMENT AND PLAN / ED COURSE  I reviewed the triage vital signs and the nursing notes.                              Differential diagnosis includes, but is not limited to, COPD exacerbation, bacterial pneumonia, viral URI, lice or dandruff --considered but less likely ACS, PE, sepsis     MDM: This is a patient with COPD with signs and symptoms of COPD exacerbation though very mild.  He states he ran out of his inhaler which usually calms his  symptoms and would like a refill.  Fortunately his EKG is nonischemic and has no hyperkalemic changes and his K is ever so slightly above the range of normal.  His creatinine is at baseline.  I will treat his COPD exacerbation with albuterol and give him a refill, as well as a single dose of Decadron.  It does not appear that he has lice but rather dandruff so I prescribed him a shampoo for this.  I gave him contact information to establish care with PMD here.  He will be discharged and was told to return to the emergency department with any worsening.    Patient's presentation is most consistent with acute presentation with potential threat to life or bodily function.       FINAL CLINICAL IMPRESSION(S) / ED DIAGNOSES   Final diagnoses:  Dandruff in adult  COPD with acute exacerbation ([place])     Rx / DC Orders   ED Discharge Orders          Ordered    pyrithione zinc (HEAD AND SHOULDERS) 1 % shampoo  Daily PRN        01/19/22 1456             Note:  This document was prepared using Dragon voice recognition software and may include unintentional dictation errors.    Lucillie Garfinkel, MD 01/19/22 1500

## 2022-01-19 NOTE — Discharge Instructions (Addendum)
Use the albuterol inhaler 2 puffs every 4-6 hours as needed for shortness of breath and wheezing.  Use shampoo as directed for dandruff.  Thank you for choosing [place] for your health care today!  Please see your primary doctor this week for a follow up appointment.   Sometimes, in the early stages of certain disease courses it is difficult to detect in the emergency department evaluation -- so, it is important that you continue to monitor your symptoms and call your doctor right away or return to the emergency department if you develop any new or worsening symptoms.  Please go to the following website to schedule new (and existing) patient appointments:   [email or web address]  If you do not have a primary doctor try calling the following clinics to establish care:  If you have insurance:  [facility] [phone] [place] [address]   [facility]  [phone] [place] [place] [address]   If you do not have insurance:  [facility]  [phone] [address], [place] [address]   The following is another list of primary care offices in the area who are accepting new patients at this time.  Please reach out to one of them directly and let them know you would like to schedule an appointment to follow up on an Emergency Department visit, and/or to establish a new primary care provider (PCP).  There are likely other primary care clinics in the are who are accepting new patients, but this is an excellent place to start:  [place] physician: Dr Lavon Paganini [address] [place], [place] [address] [phone]  [facility] Lead Physician: Dr Steele Sizer [address], [place], [place] [address] [phone]  [place] Physician: Dr Park Liter [address] [place], [place] [address] [phone]  [facility] Lead Physician: Dr Dewaine Oats [place], [place], [place] [address] [phone]  [facility] Physician: Dr Halina Maidens [address] [place], [place] [address] [phone]   It was my pleasure to care for you today.   Hoover Brunette Jacelyn Grip, MD

## 2022-01-19 NOTE — ED Triage Notes (Signed)
Pt to ED POV for chest pain and SOB since last night. Pain radiates to L arm. Unable to describe pain, states "it just makes so I can't breathe".

## 2022-01-19 NOTE — ED Notes (Signed)
Sunquest labels used, printer not working.

## 2022-01-19 NOTE — ED Notes (Signed)
Pt states is concerned may have lice. Was recently around "a girl who had lice and didn't tell me". Pt states he has seen "little white things in my beard" and complains of back itching when in bed. States already had house checked for bedbugs.

## 2022-02-14 ENCOUNTER — Ambulatory Visit
Admission: EM | Admit: 2022-02-14 | Discharge: 2022-02-14 | Disposition: A | Discharge status: Home / Self Care | Payer: 59 | Attending: Emergency Medicine | Admitting: Emergency Medicine

## 2022-02-14 DIAGNOSIS — R519 Headache, unspecified: Secondary | ICD-10-CM

## 2022-02-14 MED ORDER — DEXAMETHASONE SODIUM PHOSPHATE 10 MG/ML IJ SOLN
10.0000 mg | Freq: Once | INTRAMUSCULAR | Status: AC
  Administered 2022-02-14: 10 mg via INTRAMUSCULAR

## 2022-02-14 MED ORDER — BENZONATATE 100 MG PO CAPS: 100.0000 mg | ORAL_CAPSULE | Freq: Three times a day (TID) | ORAL | 0 refills | Status: DC

## 2022-02-14 MED ORDER — KETOROLAC TROMETHAMINE 30 MG/ML IJ SOLN
30.0000 mg | Freq: Once | INTRAMUSCULAR | Status: AC
  Administered 2022-02-14: 30 mg via INTRAMUSCULAR

## 2022-02-14 NOTE — ED Triage Notes (Signed)
Pt reports a headache on the left side due to recent fall 6 days.

## 2022-02-14 NOTE — ED Provider Notes (Signed)
[facility]    CSN: [id number] Arrival date & time: 02/14/22  1401      History   Chief Complaint No chief complaint on file.   HPI Joe Coleman is a 77 y.o. male.   Ration of a left rectal headache occurring intermittently for 6 days after fall.  It was initially evaluated in the emergency department after injury, CT negative.  Endorses symptoms last for a few hours before spontaneous resolution.  When applies pressure against the head this does seem to improve symptoms temporarily.  Has not attempted treatment, follow with ibuprofen causing GI irritation.  Denies use of blood thinners.  Baseline coughing worsening symptoms.  Denies dizziness, lightheadedness, visual changes, memory or speech changes, weakness, lethargy.     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 Authorizing Provider  albuterol (PROVENTIL HFA;VENTOLIN HFA) 108 (90 Base) MCG/ACT inhaler Inhale 2 puffs into the lungs every 6 (six) hours as needed for wheezing or shortness of breath. 04/01/16   Merlyn Lot, MD  amLODipine (NORVASC) 5 MG tablet Take 5 mg by mouth daily. 02/25/20   [provider]  aspirin EC 81 MG tablet Take 81 mg by mouth daily.    [provider]  gabapentin (NEURONTIN) 300 MG capsule Take 300 mg by mouth at bedtime.    [provider]  metFORMIN (GLUCOPHAGE) 500 MG tablet Take by mouth 2 (two) times daily with  a meal.    [provider]  Nirmatrelvir & Ritonavir (PAXLOVID) 20 x 150 MG & 10 x '100MG'$  TBPK Take 1 tablet by mouth as directed. 03/14/20   [provider]  pantoprazole (PROTONIX) 40 MG tablet Take 1 tablet (40 mg total) by mouth daily. 03/20/20 04/19/20  Wyvonnia Dusky, MD  pyrithione zinc (HEAD AND SHOULDERS) 1 % shampoo Apply topically daily as needed for itching. 01/19/22   Lucillie Garfinkel, MD    Family History Family History  Problem Relation Age of Onset   Heart disease Mother    Heart disease Brother     Social History Social History   Tobacco Use   Smoking status: Former   Smokeless tobacco: Never  Scientific laboratory technician Use: Never used  Substance Use Topics   Alcohol use: No   Drug use: No     Allergies   Chicken allergy, Losartan, and Poultry meal   Review of Systems Review of Systems  Constitutional: Negative.   HENT: Negative.    Eyes: Negative.   Respiratory: Negative.    Cardiovascular: Negative.   Neurological:  Positive for headaches. Negative for dizziness, tremors, seizures, syncope, facial asymmetry, speech difficulty, weakness, light-headedness and numbness.     Physical Exam Triage Vital Signs ED Triage Vitals  Enc Vitals Group     BP 02/14/22 1437 120/70     Pulse Rate 02/14/22 1437 89  Resp 02/14/22 1437 20     Temp 02/14/22 1437 98 F (36.7 C)     Temp Source 02/14/22 1437 Oral     SpO2 02/14/22 1437 92 %     Weight --      Height --      Head Circumference --      Peak Flow --      Pain Score 02/14/22 1439 10     Pain Loc --      Pain Edu? --      Excl. in [place]? --    No data found.  Updated Vital Signs BP 120/70 (BP Location: Left Arm)   Pulse 89   Temp 98 F (36.7 C) (Oral)   Resp 20   SpO2 92%   Visual Acuity Right Eye Distance:   Left Eye Distance:   Bilateral Distance:    Right Eye Near:   Left Eye Near:    Bilateral Near:     Physical Exam Constitutional:      Appearance: Normal  appearance.  Eyes:     Extraocular Movements: Extraocular movements intact.     Conjunctiva/sclera: Conjunctivae normal.     Pupils: Pupils are equal, round, and reactive to light.  Pulmonary:     Effort: Pulmonary effort is normal.  Neurological:     General: No focal deficit present.     Mental Status: He is alert and oriented to person, place, and time. Mental status is at baseline.     Cranial Nerves: No cranial nerve deficit.     Sensory: No sensory deficit.     Motor: No weakness.     Coordination: Coordination normal.     Gait: Gait normal.      UC Treatments / Results  Labs (all labs ordered are listed, but only abnormal results are displayed) Labs Reviewed - No data to display  EKG   Radiology No results found.  Procedures Procedures (including critical care time)  Medications Ordered in UC Medications - No data to display  Initial Impression / Assessment and Plan / UC Course  I have reviewed the triage vital signs and the nursing notes.  Pertinent labs & imaging results that were available during my care of the patient were reviewed by me and considered in my medical decision making (see chart for details).  Bad Headache  [place] [address] 24, negative, Toradol and Decadron injection given, advised to monitor closely at home, patient endorses he no longer has PCP as she has left current oropharynx, recommended attempting to be seen by another provider within 1 to 2 weeks also given information to local neurologist for further evaluation, given strict precautions at any point if headache worsens in severity he is to go to the hospital Final Clinical Impressions(s) / UC Diagnoses   Final diagnoses:  None     Discharge Instructions      Today you are being treated for your headache  No abnormalities on your neurological exam, CT completed on 02/10/2022 after your fall was negative  You may use Tylenol and or ibuprofen every 6  hours as needed for management of your headache  Ensure that you are getting adequate fluid intake primarily through water and adequate rest overnight as fatigue and dehydration will exacerbate your headache  Please schedule follow-up appointment with your primary doctor in 1 week for further evaluation and need for neurology referral if symptoms persist  At any Point if your headache worsens and you begin to  have the worst headache of your life please go to the nearest emergency department for immediate evaluation   ED Prescriptions   None    PDMP not reviewed this encounter.   Hans Eden, NP 02/14/22 1528

## 2022-02-14 NOTE — Discharge Instructions (Signed)
Today you are being treated for your headache  No abnormalities on your neurological exam, CT completed on 02/10/2022 after your fall was negative  You may use Tylenol and or ibuprofen every 6 hours as needed for management of your headache  Ensure that you are getting adequate fluid intake primarily through water and adequate rest overnight as fatigue and dehydration will exacerbate your headache  Please schedule follow-up appointment with your primary doctor in 1 week for further evaluation and need for neurology referral if symptoms persist  At any Point if your headache worsens and you begin to have the worst headache of your life please go to the nearest emergency department for immediate evaluation

## 2022-04-30 ENCOUNTER — Emergency Department: Payer: 59

## 2022-04-30 ENCOUNTER — Emergency Department
Admission: EM | Admit: 2022-04-30 | Discharge: 2022-04-30 | Disposition: A | Discharge status: Home / Self Care | Payer: 59 | Attending: Student in an Organized Health Care Education/Training Program | Admitting: Student in an Organized Health Care Education/Training Program

## 2022-04-30 DIAGNOSIS — M549 Dorsalgia, unspecified: Secondary | ICD-10-CM | POA: Insufficient documentation

## 2022-04-30 DIAGNOSIS — R410 Disorientation, unspecified: Secondary | ICD-10-CM | POA: Insufficient documentation

## 2022-04-30 DIAGNOSIS — Y92410 Unspecified street and highway as the place of occurrence of the external cause: Secondary | ICD-10-CM | POA: Diagnosis not present

## 2022-04-30 DIAGNOSIS — M25539 Pain in unspecified wrist: Secondary | ICD-10-CM | POA: Diagnosis not present

## 2022-04-30 LAB — COMPREHENSIVE METABOLIC PANEL
ALT: 11 U/L (ref 0–44)
AST: 20 U/L (ref 15–41)
Albumin: 3.5 g/dL (ref 3.5–5.0)
Alkaline Phosphatase: 118 U/L (ref 38–126)
Anion gap: 8 (ref 5–15)
BUN: 26 mg/dL — ABNORMAL HIGH (ref 8–23)
CO2: 24 mmol/L (ref 22–32)
Calcium: 8.6 mg/dL — ABNORMAL LOW (ref 8.9–10.3)
Chloride: 104 mmol/L (ref 98–111)
Creatinine, Ser: 1.64 mg/dL — ABNORMAL HIGH (ref 0.61–1.24)
GFR, Estimated: 43 mL/min — ABNORMAL LOW (ref >60)
Glucose, Bld: 141 mg/dL — ABNORMAL HIGH (ref 70–99)
Potassium: 5.2 mmol/L — ABNORMAL HIGH (ref 3.5–5.1)
Sodium: 136 mmol/L (ref 135–145)
Total Bilirubin: 0.6 mg/dL (ref 0.3–1.2)
Total Protein: 6.6 g/dL (ref 6.5–8.1)

## 2022-04-30 LAB — CBC WITH DIFFERENTIAL/PLATELET
Abs Immature Granulocytes: 0.09 10*3/uL — ABNORMAL HIGH (ref 0.00–0.07)
Basophils Absolute: 0.1 10*3/uL (ref 0.0–0.1)
Basophils Relative: 1 %
Eosinophils Absolute: 0.4 10*3/uL (ref 0.0–0.5)
Eosinophils Relative: 5 %
HCT: 36.9 % — ABNORMAL LOW (ref 39.0–52.0)
Hemoglobin: 11.1 g/dL — ABNORMAL LOW (ref 13.0–17.0)
Immature Granulocytes: 1 %
Lymphocytes Relative: 19 %
Lymphs Abs: 1.6 10*3/uL (ref 0.7–4.0)
MCH: 27.1 pg (ref 26.0–34.0)
MCHC: 30.1 g/dL (ref 30.0–36.0)
MCV: 90.2 fL (ref 80.0–100.0)
Monocytes Absolute: 0.7 10*3/uL (ref 0.1–1.0)
Monocytes Relative: 8 %
Neutro Abs: 5.5 10*3/uL (ref 1.7–7.7)
Neutrophils Relative %: 66 %
Platelets: 229 10*3/uL (ref 150–400)
RBC: 4.09 MIL/uL — ABNORMAL LOW (ref 4.22–5.81)
RDW: 16 % — ABNORMAL HIGH (ref 11.5–15.5)
WBC: 8.3 10*3/uL (ref 4.0–10.5)
nRBC: 0 % (ref 0.0–0.2)

## 2022-04-30 LAB — TROPONIN I (HIGH SENSITIVITY): Troponin I (High Sensitivity): 5 ng/L (ref <18)
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
## 2022-04-30 NOTE — ED Provider Notes (Signed)
MRI negative, patient reassessed, well-appearing appropriate for discharge at this time   Lavonia Drafts, MD 04/30/22 1700
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
## 2022-05-21 DIAGNOSIS — R0602 Shortness of breath: Secondary | ICD-10-CM | POA: Insufficient documentation

## 2022-07-26 DIAGNOSIS — W19XXXA Unspecified fall, initial encounter: Secondary | ICD-10-CM | POA: Insufficient documentation

## 2022-07-26 DIAGNOSIS — D72810 Lymphocytopenia: Secondary | ICD-10-CM | POA: Insufficient documentation

## 2022-07-26 DIAGNOSIS — E871 Hypo-osmolality and hyponatremia: Secondary | ICD-10-CM | POA: Insufficient documentation

## 2022-07-26 DIAGNOSIS — H5713 Ocular pain, bilateral: Secondary | ICD-10-CM | POA: Insufficient documentation

## 2022-10-09 ENCOUNTER — Emergency Department: Payer: 59

## 2022-10-09 ENCOUNTER — Emergency Department
Admission: EM | Admit: 2022-10-09 | Discharge: 2022-10-09 | Disposition: A | Discharge status: Home / Self Care | Payer: 59 | Attending: Emergency Medicine | Admitting: Emergency Medicine

## 2022-10-09 ENCOUNTER — Other Ambulatory Visit: Payer: Self-pay

## 2022-10-09 DIAGNOSIS — R1013 Epigastric pain: Secondary | ICD-10-CM | POA: Diagnosis not present

## 2022-10-09 DIAGNOSIS — R059 Cough, unspecified: Secondary | ICD-10-CM | POA: Diagnosis not present

## 2022-10-09 DIAGNOSIS — E119 Type 2 diabetes mellitus without complications: Secondary | ICD-10-CM | POA: Diagnosis not present

## 2022-10-09 DIAGNOSIS — I10 Essential (primary) hypertension: Secondary | ICD-10-CM | POA: Insufficient documentation

## 2022-10-09 DIAGNOSIS — Z1152 Encounter for screening for COVID-19: Secondary | ICD-10-CM | POA: Diagnosis not present

## 2022-10-09 DIAGNOSIS — R0789 Other chest pain: Secondary | ICD-10-CM | POA: Insufficient documentation

## 2022-10-09 DIAGNOSIS — R079 Chest pain, unspecified: Secondary | ICD-10-CM

## 2022-10-09 LAB — CBC
HCT: 35.2 % — ABNORMAL LOW (ref 39.0–52.0)
Hemoglobin: 10.6 g/dL — ABNORMAL LOW (ref 13.0–17.0)
MCH: 27.2 pg (ref 26.0–34.0)
MCHC: 30.1 g/dL (ref 30.0–36.0)
MCV: 90.5 fL (ref 80.0–100.0)
Platelets: 226 10*3/uL (ref 150–400)
RBC: 3.89 MIL/uL — ABNORMAL LOW (ref 4.22–5.81)
RDW: 14.7 % (ref 11.5–15.5)
WBC: 8.4 10*3/uL (ref 4.0–10.5)
nRBC: 0 % (ref 0.0–0.2)

## 2022-10-09 LAB — BASIC METABOLIC PANEL
Anion gap: 7 (ref 5–15)
BUN: 25 mg/dL — ABNORMAL HIGH (ref 8–23)
CO2: 20 mmol/L — ABNORMAL LOW (ref 22–32)
Calcium: 8.2 mg/dL — ABNORMAL LOW (ref 8.9–10.3)
Chloride: 107 mmol/L (ref 98–111)
Creatinine, Ser: 1.67 mg/dL — ABNORMAL HIGH (ref 0.61–1.24)
GFR, Estimated: 41 mL/min — ABNORMAL LOW (ref >60)
Glucose, Bld: 157 mg/dL — ABNORMAL HIGH (ref 70–99)
Potassium: 5.3 mmol/L — ABNORMAL HIGH (ref 3.5–5.1)
Sodium: 134 mmol/L — ABNORMAL LOW (ref 135–145)

## 2022-10-09 LAB — HEPATIC FUNCTION PANEL
ALT: 11 U/L (ref 0–44)
AST: 26 U/L (ref 15–41)
Albumin: 3.5 g/dL (ref 3.5–5.0)
Alkaline Phosphatase: 103 U/L (ref 38–126)
Bilirubin, Direct: <0.1 mg/dL (ref 0.0–0.2)
Total Bilirubin: 0.5 mg/dL (ref 0.3–1.2)
Total Protein: 6.2 g/dL — ABNORMAL LOW (ref 6.5–8.1)

## 2022-10-09 LAB — LIPASE, BLOOD: Lipase: 28 U/L (ref 11–51)

## 2022-10-09 LAB — TROPONIN I (HIGH SENSITIVITY)
Troponin I (High Sensitivity): 6 ng/L (ref <18)
Troponin I (High Sensitivity): 6 ng/L (ref <18)

## 2022-10-09 LAB — SARS CORONAVIRUS 2 BY RT PCR: SARS Coronavirus 2 by RT PCR: NEGATIVE

## 2022-10-09 MED ORDER — HYDROCOD POLI-CHLORPHE POLI ER 10-8 MG/5ML PO SUER
5.0000 mL | Freq: Once | ORAL | Status: AC
  Administered 2022-10-09: 5 mL via ORAL
  Filled 2022-10-09: qty 5

## 2022-10-09 MED ORDER — GUAIFENESIN-CODEINE 100-10 MG/5ML PO SOLN
5.0000 mL | Freq: Four times a day (QID) | ORAL | 0 refills | Status: DC | PRN
Start: 2022-10-09 — End: 2023-08-10

## 2022-10-09 NOTE — Discharge Instructions (Signed)
Please take your cough medication as needed for cough.  Do not drink alcohol or drive while taking this medication.  Please follow-up with your doctor in the next several days for recheck/reevaluation.  Return to the emergency department for any worsening/return of chest pain, any trouble breathing or any symptom personally concerning to yourself.

## 2022-10-09 NOTE — ED Provider Notes (Signed)
[facility] Provider Note    Event Date/Time   First MD Initiated Contact with Patient 10/09/22 1544     (approximate)  History   Chief Complaint: Chest Pain  HPI  Joe Coleman is a 77 y.o. male with a past medical history of diabetes, hypertension, presents to the emergency department for chest/epigastric pain.  According to the patient since lunchtime or so today he has been experiencing some mild pain in the epigastrium and lower chest.  States he has been coughing over the last few days and intermittently has been feeling short of breath although denies any currently lying in bed.  Denies any cardiac disease, no stents or past heart attacks.  Denies any lower extremity edema.  No fever.  Physical Exam   Triage Vital Signs: ED Triage Vitals  Encounter Vitals Group     BP 10/09/22 1418 108/74     Systolic BP Percentile --      Diastolic BP Percentile --      Pulse Rate 10/09/22 1418 92     Resp 10/09/22 1418 20     Temp 10/09/22 1418 98.1 F (36.7 C)     Temp Source 10/09/22 1616 Oral     SpO2 10/09/22 1418 96 %     Weight 10/09/22 1418 170 lb (77.1 kg)     Height 10/09/22 1418 5' 8.5" (1.74 m)     Head Circumference --      Peak Flow --      Pain Score 10/09/22 1418 0     Pain Loc --      Pain Education --      Exclude from Growth Chart --     Most recent vital signs: Vitals:   10/09/22 1418 10/09/22 1616  BP: 108/74 (!) 150/88  Pulse: 92 70  Resp: 20 (!) 24  Temp: 98.1 F (36.7 C) 98.1 F (36.7 C)  SpO2: 96% (!) 10%    General: Awake, no distress.  CV:  Good peripheral perfusion.  Regular rate and rhythm  Resp:  Normal effort.  Equal breath sounds bilaterally.  Abd:  No distention.  Soft, nontender.  No rebound or guarding.  Benign abdomen Other:  No lower extremity edema no calf tenderness to palpation.   ED Results / Procedures / Treatments   EKG  EKG viewed and interpreted by myself shows a normal sinus rhythm at 91 bpm  with a narrow QRS, normal axis, normal intervals, no concerning ST changes.  RADIOLOGY  I have reviewed and interpreted chest x-ray images.  No consolidation on my evaluation. Radiology is read the x-ray is negative   MEDICATIONS ORDERED IN ED: Medications - No data to display   IMPRESSION / MDM / ASSESSMENT AND PLAN / ED COURSE  I reviewed the triage vital signs and the nursing notes.  Patient's presentation is most consistent with acute presentation with potential threat to life or bodily function.  Patient presents the emergency department for chest/epigastric discomfort after eating lunch today.  Currently the patient appears well states the pain is alleviated.  Does state occasional cough and shortness of breath recently.  No pleuritic pain.  No leg pain or swelling.  Patient's workup is so far reassuring with normal CBC with a normal white blood cell count chemistry does show mild renal insufficiency, lipase is normal.  I have added on hepatic function panel which shows show normal results.  Patient has no right upper quadrant tenderness on my exam.  Chest  x-ray is clear and EKG reassuring.  We will orally hydrate the patient in the emergency department.  We will repeat a troponin.  Given the patient's cough complaint we will also obtain a COVID swab and treat with a cough medication.  Patient agreeable to plan of care and workup.  Patient's COVID test is negative, repeat troponin remains negative.  Patient is feeling better.  Given the patient's cough we will discharge with short course of cough medication.  Given the patient's reassuring workup and negative troponin x 2 believe the patient will be safe for discharge home with outpatient follow-up.  Patient agreeable to plan of care.  FINAL CLINICAL IMPRESSION(S) / ED DIAGNOSES   Chest pain Cough  Note:  This document was prepared using Dragon voice recognition software and may include unintentional dictation errors.   Minna Antis, MD 10/09/22 Ernestina [place]

## 2022-10-09 NOTE — ED Triage Notes (Signed)
Pt to ED for chest/epigastric pain started today after eating lunch. +shob. Cough noted. Also reports abd pain for the past few days

## 2022-10-13 ENCOUNTER — Emergency Department: Payer: 59

## 2022-10-13 ENCOUNTER — Encounter: Payer: Self-pay | Admitting: Emergency Medicine

## 2022-10-13 ENCOUNTER — Emergency Department
Admission: EM | Admit: 2022-10-13 | Discharge: 2022-10-13 | Disposition: A | Discharge status: Home / Self Care | Payer: 59 | Attending: Emergency Medicine | Admitting: Emergency Medicine

## 2022-10-13 ENCOUNTER — Other Ambulatory Visit: Payer: Self-pay

## 2022-10-13 DIAGNOSIS — I10 Essential (primary) hypertension: Secondary | ICD-10-CM | POA: Diagnosis not present

## 2022-10-13 DIAGNOSIS — E119 Type 2 diabetes mellitus without complications: Secondary | ICD-10-CM | POA: Insufficient documentation

## 2022-10-13 DIAGNOSIS — Z20822 Contact with and (suspected) exposure to covid-19: Secondary | ICD-10-CM | POA: Diagnosis not present

## 2022-10-13 DIAGNOSIS — Z8616 Personal history of COVID-19: Secondary | ICD-10-CM | POA: Insufficient documentation

## 2022-10-13 DIAGNOSIS — J449 Chronic obstructive pulmonary disease, unspecified: Secondary | ICD-10-CM | POA: Diagnosis not present

## 2022-10-13 DIAGNOSIS — J069 Acute upper respiratory infection, unspecified: Secondary | ICD-10-CM | POA: Insufficient documentation

## 2022-10-13 DIAGNOSIS — R059 Cough, unspecified: Secondary | ICD-10-CM | POA: Diagnosis present

## 2022-10-13 LAB — RESP PANEL BY RT-PCR (RSV, FLU A&B, COVID)  RVPGX2
Influenza A by PCR: NEGATIVE
Influenza B by PCR: NEGATIVE
Resp Syncytial Virus by PCR: NEGATIVE
SARS Coronavirus 2 by RT PCR: NEGATIVE

## 2022-10-13 NOTE — ED Provider Notes (Signed)
[facility] Provider Note    Event Date/Time   First MD Initiated Contact with Patient 10/13/22 1106     (approximate)   History   Cough and Nasal Congestion   HPI  Joe Coleman is a 77 y.o. male who presents today for evaluation of nasal congestion and cough.  He reports that he was around his family members who he was unaware had COVID, and he subsequently found out after he has been sick significant amount of time with them.  He reports that his symptoms began over the last couple of days.  He denies feeling any chest pain or shortness of breath.  No fevers or chills.  No abdominal pain, nausea, vomiting, diarrhea.  He is requesting be tested for COVID.  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   Diabetes (HCC) 03/17/2020   HTN (hypertension) 03/17/2020          Physical Exam   Triage Vital Signs: ED Triage Vitals  Encounter Vitals Group     BP 10/13/22 1030 (!) 169/81     Systolic BP Percentile --      Diastolic BP Percentile --      Pulse Rate 10/13/22 1030 82     Resp 10/13/22 1030 18     Temp 10/13/22 1030 98 F (36.7 C)     Temp Source 10/13/22 1030 Oral     SpO2 10/13/22 1030 100 %     Weight 10/13/22 1032 160 lb (72.6 kg)     Height 10/13/22 1032 5' 8.5" (1.74 m)     Head Circumference --      Peak Flow --      Pain Score 10/13/22 1032 0     Pain Loc --      Pain Education --      Exclude from Growth Chart --     Most recent vital signs: Vitals:   10/13/22 1030  BP: (!) 169/81  Pulse: 82  Resp: 18  Temp: 98 F (36.7 C)  SpO2: 100%    Physical Exam Vitals and nursing note reviewed.  Constitutional:      General: Awake and alert. No acute distress.    Appearance: Normal appearance. The patient is normal weight.  HENT:     Head: Normocephalic and atraumatic.     Mouth:  Mucous membranes are moist.  Nasal congestion present Eyes:     General: PERRL. Normal EOMs        Right eye: No discharge.        Left eye: No discharge.     Conjunctiva/sclera: Conjunctivae normal.  Cardiovascular:     Rate and Rhythm: Normal rate and regular rhythm.     Pulses: Normal pulses.  Pulmonary:     Effort: Pulmonary effort is normal. No respiratory distress.     Breath sounds: Normal breath sounds.  Abdominal:     Abdomen is soft. There is no abdominal tenderness. No rebound or guarding. No distention. Musculoskeletal:        General: No swelling. Normal range of motion.     Cervical back: Normal range of motion and neck supple.  No lower extremity edema Skin:    General: Skin is warm and dry.     Capillary Refill: Capillary refill takes less than 2 seconds.     Findings: No rash.  Neurological:     Mental Status: The patient is awake and alert.      ED Results / Procedures / Treatments   Labs (all labs ordered are listed, but only abnormal results are displayed) Labs Reviewed  RESP PANEL BY RT-PCR (RSV, FLU A&B, COVID)  [id number]     EKG     RADIOLOGY I independently reviewed and interpreted imaging and agree with radiologists findings.     PROCEDURES:  Critical Care performed:   Procedures   MEDICATIONS ORDERED IN ED: Medications - No data to display   IMPRESSION / MDM / ASSESSMENT AND PLAN / ED COURSE  I reviewed the triage vital signs and the nursing notes.   Differential diagnosis includes, but is not limited to, COVID, bronchitis, pneumonia, other URI.  Patient is awake and alert, hemodynamically stable and afebrile.  He has normal oxygen saturation of 100% on room air and demonstrates no increased work of breathing.  He has no wheezing on exam.  He has no rales on exam.  There is no JVD or lower extremity edema, not consistent with CHF or volume overload.  He has no chest pain or pleurisy or clinical signs or symptoms of DVT to suggest  PE.  Symptoms of nasal congestion and cough are consistent with URI.  COVID test was negative.  His chest x-ray does not reveal any cardiopulmonary abnormality.  Patient is reassured by these results.  He is well in appearance and I do not feel he requires hospitalization.  We discussed return precautions and outpatient follow-up.  Patient understands and agrees with plan.  He was discharged in stable condition.   Patient's presentation is most consistent with acute complicated illness / injury requiring diagnostic workup.      FINAL CLINICAL IMPRESSION(S) / ED DIAGNOSES   Final diagnoses:  Upper respiratory tract infection, unspecified type     Rx / DC Orders   ED Discharge Orders     None        Note:  This document was prepared using Dragon voice recognition software and may include unintentional dictation errors.   Jackelyn Hoehn, PA-C 10/13/22 1327    Janith Lima, MD 10/13/22 [phone]
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
## 2022-10-13 NOTE — Discharge Instructions (Signed)
You covid test and chest xray are normal. Please rest, drink plenty of fluids, and return for any new, worsening, or change in symptoms or other concerns.

## 2022-10-14 NOTE — Group Note (Deleted)

## 2022-11-10 ENCOUNTER — Other Ambulatory Visit: Payer: Self-pay

## 2022-11-10 ENCOUNTER — Emergency Department
Admission: EM | Admit: 2022-11-10 | Discharge: 2022-11-10 | Disposition: A | Discharge status: Home / Self Care | Payer: 59 | Attending: Emergency Medicine | Admitting: Emergency Medicine

## 2022-11-10 ENCOUNTER — Emergency Department: Payer: 59

## 2022-11-10 ENCOUNTER — Encounter: Payer: Self-pay | Admitting: Intensive Care

## 2022-11-10 DIAGNOSIS — M542 Cervicalgia: Secondary | ICD-10-CM | POA: Diagnosis present

## 2022-11-10 DIAGNOSIS — R519 Headache, unspecified: Secondary | ICD-10-CM | POA: Diagnosis not present

## 2022-11-10 DIAGNOSIS — J449 Chronic obstructive pulmonary disease, unspecified: Secondary | ICD-10-CM | POA: Diagnosis not present

## 2022-11-10 DIAGNOSIS — E119 Type 2 diabetes mellitus without complications: Secondary | ICD-10-CM | POA: Insufficient documentation

## 2022-11-10 DIAGNOSIS — Z8616 Personal history of COVID-19: Secondary | ICD-10-CM | POA: Insufficient documentation

## 2022-11-10 DIAGNOSIS — Y92410 Unspecified street and highway as the place of occurrence of the external cause: Secondary | ICD-10-CM | POA: Insufficient documentation

## 2022-11-10 DIAGNOSIS — I10 Essential (primary) hypertension: Secondary | ICD-10-CM | POA: Diagnosis not present

## 2022-11-10 MED ORDER — LIDOCAINE 5 % EX PTCH
1.0000 | MEDICATED_PATCH | CUTANEOUS | Status: DC
  Administered 2022-11-10: 1 via TRANSDERMAL
  Filled 2022-11-10: qty 1

## 2022-11-10 MED ORDER — ACETAMINOPHEN 325 MG PO TABS
650.0000 mg | ORAL_TABLET | Freq: Once | ORAL | Status: AC
  Administered 2022-11-10: 650 mg via ORAL
  Filled 2022-11-10: qty 2
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
## 2022-12-09 ENCOUNTER — Ambulatory Visit: Payer: 59 | Admitting: Family Medicine

## 2023-01-29 ENCOUNTER — Emergency Department
Admission: EM | Admit: 2023-01-29 | Discharge: 2023-01-29 | Disposition: A | Discharge status: Home / Self Care | Payer: 59 | Attending: Emergency Medicine | Admitting: Emergency Medicine

## 2023-01-29 ENCOUNTER — Other Ambulatory Visit: Payer: Self-pay

## 2023-01-29 DIAGNOSIS — Z8616 Personal history of COVID-19: Secondary | ICD-10-CM | POA: Insufficient documentation

## 2023-01-29 DIAGNOSIS — E119 Type 2 diabetes mellitus without complications: Secondary | ICD-10-CM | POA: Diagnosis not present

## 2023-01-29 DIAGNOSIS — J449 Chronic obstructive pulmonary disease, unspecified: Secondary | ICD-10-CM | POA: Diagnosis not present

## 2023-01-29 DIAGNOSIS — I10 Essential (primary) hypertension: Secondary | ICD-10-CM | POA: Insufficient documentation

## 2023-01-29 DIAGNOSIS — R21 Rash and other nonspecific skin eruption: Secondary | ICD-10-CM | POA: Insufficient documentation

## 2023-01-29 MED ORDER — PERMETHRIN 5 % EX CREA: 1.0000 | TOPICAL_CREAM | Freq: Once | CUTANEOUS | 0 refills | Status: AC

## 2023-01-29 NOTE — ED Triage Notes (Signed)
Pt to ED from home POV for rash to back, states has been scratching it a lot. Pt also has itching and scratching to both legs. Everything is since 2 days. No other complaints.

## 2023-01-29 NOTE — Discharge Instructions (Signed)
Apply the cream as directed.  Wash all of your clothes and sheets.  Please follow-up with dermatology if your symptoms persist.  Please return for any new, worsening, or change in symptoms or other concerns.  It was a pleasure caring for you today.

## 2023-01-29 NOTE — ED Provider Notes (Signed)
[facility] Provider Note    Event Date/Time   First MD Initiated Contact with Patient 01/29/23 1300     (approximate)   History   Rash   HPI  Joe Coleman is a 77 y.o. male who presents today for evaluation of itchy rash.  He reports that it began on his back 2 days ago and has since spread to his arms, torso, and upper legs.  He reports that he has not had any new exposures to detergents, medicines, soaps, foods, etc.  He denies having slept anywhere different than usual.  No fevers or chills.  No mucosal involvement.  No pain.  No facial involvement.  No trouble breathing or swallowing.  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   Diabetes (HCC) 03/17/2020   HTN (hypertension) 03/17/2020          Physical Exam   Triage Vital Signs: ED Triage Vitals  Encounter Vitals Group     BP 01/29/23 1232 107/69     Systolic BP Percentile --      Diastolic BP Percentile --      Pulse Rate 01/29/23 1232 95     Resp 01/29/23 1232 18     Temp 01/29/23 1232 97.8 F (36.6 C)     Temp Source 01/29/23 1232 Oral     SpO2 01/29/23 1232 97 %     Weight 01/29/23 1236 155 lb (70.3 kg)     Height 01/29/23 1236 5\' 8"  (1.727 m)     Head Circumference --      Peak Flow --      Pain Score 01/29/23 1236 0     Pain Loc --      Pain Education --      Exclude from Growth Chart --     Most recent vital signs: Vitals:   01/29/23 1232  BP: 107/69  Pulse: 95  Resp: 18  Temp: 97.8 F (36.6 C)  SpO2: 97%    Physical Exam Vitals and nursing note reviewed.  Constitutional:      General: Awake and alert. No acute distress.    Appearance: Normal appearance. The patient is normal weight.  HENT:     Head: Normocephalic and atraumatic.     Mouth: Mucous membranes are moist.  Eyes:     General: PERRL. Normal EOMs         Right eye: No discharge.        Left eye: No discharge.     Conjunctiva/sclera: Conjunctivae normal.  Cardiovascular:     Rate and Rhythm: Normal rate and regular rhythm.     Pulses: Normal pulses.  Pulmonary:     Effort: Pulmonary effort is normal. No respiratory distress.     Breath sounds: Normal breath sounds.  Abdominal:     Abdomen is soft. There is no abdominal tenderness. No rebound or guarding. No distention. Musculoskeletal:        General: No swelling. Normal range of motion.     Cervical back: Normal range of motion and neck supple.  Skin:    General: Skin is warm and dry.     Capillary Refill: Capillary refill takes less than 2 seconds.     Findings: Punctate erythematous lesions with scabbing noted to torso and arms.  No involvement of palms or soles.  No urticaria.  No facial involvement.  No tongue or lip swelling.  No mucosal involvement.  No vesicles or bullae.  Skin appears to be dry and flaky. Neurological:     Mental Status: The patient is awake and alert.      ED Results / Procedures / Treatments   Labs (all labs ordered are listed, but only abnormal results are displayed) Labs Reviewed - No data to display   EKG     RADIOLOGY     PROCEDURES:  Critical Care performed:   Procedures   MEDICATIONS ORDERED IN ED: Medications - No data to display   IMPRESSION / MDM / ASSESSMENT AND PLAN / ED COURSE  I reviewed the triage vital signs and the nursing notes.   Differential diagnosis includes, but is not limited to, scabies, contact dermatitis, less likely allergic reaction.  Patient is awake and alert, hemodynamically stable and afebrile.  He is nontoxic in appearance.  There is no involvement of his palms or soles.  There is no involvement of his tongue or lips, not consistent angioedema.  No difficulty swallowing or breathing, no wheezing, no nausea, vomiting, diarrhea, or abdominal pain, not consistent with anaphylaxis.  No new exposures to  suggest allergic reaction or contact dermatitis.  Rash appears to be most consistent with scabies.  He was tried on permethrin.  We discussed the importance of washing all of his sheets and clothes in hot water.  We discussed the importance of outpatient follow-up with dermatology if his symptoms persist and the appropriate follow-up information was provided.  We discussed return precautions and outpatient follow-up.  Patient understands and agrees with plan.  He was discharged in stable condition.   Patient's presentation is most consistent with acute illness / injury with system symptoms.    FINAL CLINICAL IMPRESSION(S) / ED DIAGNOSES   Final diagnoses:  Rash     Rx / DC Orders   ED Discharge Orders          Ordered    permethrin (ELIMITE) 5 % cream   Once        01/29/23 1306             Note:  This document was prepared using Dragon voice recognition software and may include unintentional dictation errors.   Jackelyn Hoehn, PA-C 01/29/23 1424    Trinna Post, MD 01/29/23 [phone]

## 2023-02-06 ENCOUNTER — Ambulatory Visit: Payer: 59 | Admitting: Dermatology

## 2023-02-06 ENCOUNTER — Encounter: Payer: Self-pay | Admitting: Dermatology

## 2023-02-06 DIAGNOSIS — L309 Dermatitis, unspecified: Secondary | ICD-10-CM

## 2023-02-06 MED ORDER — CLOBETASOL PROPIONATE 0.05 % EX CREA
1.0000 | TOPICAL_CREAM | Freq: Two times a day (BID) | CUTANEOUS | 2 refills | Status: DC
Start: 2023-02-06 — End: 2023-08-24

## 2023-02-06 NOTE — Progress Notes (Signed)
   New Patient Visit   Subjective  Joe Coleman is a 78 y.o. male who presents for the following: Rash  Present for about 2 weeks at arms, trunk and legs, itches. Patient went to ER and was given permethrin  to use as needed to areas that were itching. No new medications, no new soaps. Patient lives by himself.   The following portions of the chart were reviewed this encounter and updated as appropriate: medications, allergies, medical history  Review of Systems:  No other skin or systemic complaints except as noted in HPI or Assessment and Plan.  Objective  Well appearing patient in no apparent distress; mood and affect are within normal limits.  A focused examination was performed of the following areas: Arms, legs, trunk  Relevant exam findings are noted in the Assessment and Plan.    Assessment & Plan   ECZEMA, UNSPECIFIED TYPE   Related Medications clobetasol  cream (TEMOVATE ) 0.05 % Apply 1 Application topically 2 (two) times daily. For up to 2 weeks as needed for itch. Avoid applying to face, groin, and axilla. Use as directed. Long-term use can cause thinning of the skin.  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 Condition is bothersome/symptomatic for patient. Currently flared.   Treatment Plan: Start clobetasol  0.05% cream twice daily as needed for itch for up to 2 weeks. Avoid applying to face, groin, and axilla. Use as directed. Long-term use can cause thinning of the skin. Can be caused by dryness. Moisturize daily Can consider dupixent  if not improved  Topical steroids (such as triamcinolone, fluocinolone, fluocinonide, mometasone, clobetasol , halobetasol, betamethasone, hydrocortisone ) can cause thinning and lightening of the skin if they are used  for too long in the same area. Your physician has selected the right strength medicine for your problem and area affected on the body. Please use your medication only as directed by your physician to prevent side effects.   If not improved by 2 weeks patient will let us  know.   Return if symptoms worsen or fail to improve.  LILLETTE Lonell Drones, RMA, am acting as scribe for Boneta Sharps, MD .   Documentation: I have reviewed the above documentation for accuracy and completeness, and I agree with the above.  Boneta Sharps, MD

## 2023-02-06 NOTE — Patient Instructions (Addendum)
 Treatment Plan: Start clobetasol  0.05% cream twice daily as needed for itch for up to 2 weeks. Avoid applying to face, groin, and axilla. Use as directed. Long-term use can cause thinning of the skin.  Topical steroids (such as triamcinolone, fluocinolone, fluocinonide, mometasone, clobetasol , halobetasol, betamethasone, hydrocortisone ) can cause thinning and lightening of the skin if they are used for too long in the same area. Your physician has selected the right strength medicine for your problem and area affected on the body. Please use your medication only as directed by your physician to prevent side effects.   If not improved by 2 weeks, please let us  know.  Due to recent changes in healthcare laws, you may see results of your pathology and/or laboratory studies on MyChart before the doctors have had a chance to review them. We understand that in some cases there may be results that are confusing or concerning to you. Please understand that not all results are received at the same time and often the doctors may need to interpret multiple results in order to provide you with the best plan of care or course of treatment. Therefore, we ask that you please give us  2 business days to thoroughly review all your results before contacting the office for clarification. Should we see a critical lab result, you will be contacted sooner.   If You Need Anything After Your Visit  If you have any questions or concerns for your doctor, please call our main line at [phone] and press option 4 to reach your doctor's medical assistant. If no one answers, please leave a voicemail as directed and we will return your call as soon as possible. Messages left after 4 pm will be answered the following business day.   You may also send us  a message via MyChart. We typically respond to MyChart messages within 1-2 business days.  For prescription refills, please ask your pharmacy to contact our office. Our fax number  is [phone].  If you have an urgent issue when the clinic is closed that cannot wait until the next business day, you can page your doctor at the number below.    Please note that while we do our best to be available for urgent issues outside of office hours, we are not available 24/7.   If you have an urgent issue and are unable to reach us , you may choose to seek medical care at your doctor's office, retail clinic, urgent care center, or emergency room.  If you have a medical emergency, please immediately call 911 or go to the emergency department.  Pager Numbers  - Dr. Hester: [phone]  - Dr. Jackquline: [phone]  - Dr. Claudene: [phone]   In the event of inclement weather, please call our main line at [phone] for an update on the status of any delays or closures.  Dermatology Medication Tips: Please keep the boxes that topical medications come in in order to help keep track of the instructions about where and how to use these. Pharmacies typically print the medication instructions only on the boxes and not directly on the medication tubes.   If your medication is too expensive, please contact our office at [phone] option 4 or send us  a message through MyChart.   We are unable to tell what your co-pay for medications will be in advance as this is different depending on your insurance coverage. However, we may be able to find a substitute medication at lower cost or fill out paperwork to get insurance  to cover a needed medication.   If a prior authorization is required to get your medication covered by your insurance company, please allow us  1-2 business days to complete this process.  Drug prices often vary depending on where the prescription is filled and some pharmacies may offer cheaper prices.  The website www.goodrx.com contains coupons for medications through different pharmacies. The prices here do not account for what the cost may be with help from  insurance (it may be cheaper with your insurance), but the website can give you the price if you did not use any insurance.  - You can print the associated coupon and take it with your prescription to the pharmacy.  - You may also stop by our office during regular business hours and pick up a GoodRx coupon card.  - If you need your prescription sent electronically to a different pharmacy, notify our office through [facility] or by phone at [phone] option 4.     Si Usted Necesita Algo Despus de Su Visita  Tambin puede enviarnos un mensaje a travs de Clinical Cytogeneticist. Por lo general respondemos a los mensajes de MyChart en el transcurso de 1 a 2 das hbiles.  Para renovar recetas, por favor pida a su farmacia que se ponga en contacto con nuestra oficina. Randi lakes de fax es [place] [phone].  Si tiene un asunto urgente cuando la clnica est cerrada y que no puede esperar hasta el siguiente da hbil, puede llamar/localizar a su doctor(a) al nmero que aparece a continuacin.   Por favor, tenga en cuenta que aunque hacemos todo lo posible para estar disponibles para asuntos urgentes fuera del horario de [place], no estamos disponibles las 24 horas del da, los 7 [address]  [address] de la [place].   Si tiene un problema urgente y no puede comunicarse con nosotros, puede optar por buscar atencin mdica  en el consultorio de su doctor(a), en una clnica privada, en un centro de atencin urgente o en una sala de emergencias.  Si tiene engineer, drilling, por favor llame inmediatamente al 911 o vaya a la sala de emergencias.  Nmeros de bper  - Dr. Hester: [phone]  - Dra. Jackquline: [phone]  - Dr. Claudene: [phone]   En caso de inclemencias del tiempo, por favor llame a landry capes principal al [phone] para una actualizacin sobre el [place] de cualquier retraso o cierre.  Consejos para la medicacin en dermatologa: Por favor, guarde las cajas en las que vienen los  medicamentos de uso tpico para ayudarle a seguir las instrucciones sobre dnde y cmo usarlos. Las farmacias generalmente imprimen las instrucciones del medicamento slo en las cajas y no directamente en los tubos del [place].   Si su medicamento es muy caro, por favor, pngase en contacto con landry rieger llamando al [phone] y presione la opcin 4 o envenos un mensaje a travs de Clinical Cytogeneticist.   No podemos decirle cul ser su copago por los medicamentos por adelantado ya que esto es diferente dependiendo de la cobertura de su seguro. Sin embargo, es posible que podamos encontrar un medicamento sustituto a audiological scientist un formulario para que el seguro cubra el medicamento que se considera necesario.   Si se requiere una autorizacin previa para que su compaa de seguros cubra su medicamento, por favor permtanos de 1 a 2 das hbiles para completar este proceso.  Los precios de los medicamentos varan con frecuencia dependiendo del environmental consultant de dnde se surte la receta y careers adviser pueden ofrecer  precios ms baratos.  El sitio web www.goodrx.com tiene cupones para medicamentos de health and safety inspector. Los precios aqu no tienen en cuenta lo que podra costar con la ayuda del seguro (puede ser ms barato con su seguro), pero el sitio web puede darle el precio si no utiliz tourist information centre manager.  - Puede imprimir el cupn correspondiente y llevarlo con su receta a la farmacia.  - Tambin puede pasar por nuestra oficina durante el horario de atencin regular y education officer, museum una tarjeta de cupones de GoodRx.  - Si necesita que su receta se enve electrnicamente a una farmacia diferente, informe a nuestra oficina a travs de MyChart de [place] o por telfono llamando al [phone] y presione la opcin 4.

## 2023-02-19 ENCOUNTER — Ambulatory Visit (INDEPENDENT_AMBULATORY_CARE_PROVIDER_SITE_OTHER): Payer: 59 | Admitting: Dermatology

## 2023-02-19 DIAGNOSIS — Z79899 Other long term (current) drug therapy: Secondary | ICD-10-CM | POA: Diagnosis not present

## 2023-02-19 DIAGNOSIS — Z7189 Other specified counseling: Secondary | ICD-10-CM | POA: Diagnosis not present

## 2023-02-19 DIAGNOSIS — L209 Atopic dermatitis, unspecified: Secondary | ICD-10-CM

## 2023-02-19 DIAGNOSIS — L821 Other seborrheic keratosis: Secondary | ICD-10-CM | POA: Diagnosis not present

## 2023-02-19 MED ORDER — DUPILUMAB 300 MG/2ML ~~LOC~~ SOAJ
300.0000 mg | SUBCUTANEOUS | Status: DC
Start: 2023-03-05 — End: 2023-08-24

## 2023-02-19 MED ORDER — DUPILUMAB 300 MG/2ML ~~LOC~~ SOSY
600.0000 mg | PREFILLED_SYRINGE | Freq: Once | SUBCUTANEOUS | Status: AC
Start: 2023-02-19 — End: 2023-02-19
  Administered 2023-02-19: 600 mg via SUBCUTANEOUS

## 2023-02-19 NOTE — Progress Notes (Signed)
   Follow-Up Visit   Subjective  Joe Coleman is a 78 y.o. male who presents for the following: pt reports itching getting worse. Pt states clobetasol helped some but not entirely.   The patient has spots, moles and lesions to be evaluated, some may be new or changing and the patient may have concern these could be cancer.   The following portions of the chart were reviewed this encounter and updated as appropriate: medications, allergies, medical history  Review of Systems:  No other skin or systemic complaints except as noted in HPI or Assessment and Plan.  Objective  Well appearing patient in no apparent distress; mood and affect are within normal limits.   A focused examination was performed of the following areas: Back  Relevant exam findings are noted in the Assessment and Plan.    Assessment & Plan   ATOPIC DERMATITIS, failed permethrin Exam: diffuse xerosis, scattered erythematous scaly papules and excoriations on back > arms dorsal hands lower legs 10-15% BSA  Flared  Atopic dermatitis (eczema) is a chronic, relapsing, pruritic condition that can significantly affect quality of life. It is often associated with allergic rhinitis and/or asthma and can require treatment with topical medications, phototherapy, or in severe cases biologic injectable medication (Dupixent; Adbry) or Oral JAK inhibitors.  Treatment Plan: Recommend gentle skin care.  Continue clobetasol BID until smooth Recommend starting Dupixent 300 mg/77mL SQ QOW. Dupilumab (Dupixent) is a treatment given by injection for adults and children with moderate-to-severe atopic dermatitis. Goal is control of skin condition, not cure. It is given as 2 injections at the first dose followed by 1 injection every 2 weeks thereafter.  Young children are dosed monthly.   Loading dose of Dupixent 600mg /2mL  injected SQ into the bilateral upper arms. Patient tolerated injections well. Samples of Dupixent 300mg /32mL x2  used today during visit.   NDC: 5621-3086-57 LOT: [id number] Exp: 12/2024   Potential side effects include allergic reaction, herpes infections, injection site reactions and conjunctivitis (inflammation of the eyes).  The use of Dupixent requires long term medication management, including periodic office visits.    SEBORRHEIC KERATOSIS - Stuck-on, waxy, tan-brown papules and/or plaques at back - Benign-appearing - Discussed benign etiology and prognosis. - Observe - Call for any changes   ATOPIC DERMATITIS, UNSPECIFIED TYPE   Related Medications dupilumab (DUPIXENT) prefilled syringe 600 mg  Dupilumab SOAJ 300 mg  LONG-TERM USE OF HIGH-RISK MEDICATION   COUNSELING AND COORDINATION OF CARE   SEBORRHEIC KERATOSES    Return in about 4 weeks (around 03/19/2023) for follow up on dupixent with Dr. Katrinka Blazing, x2 weeks with nurse for dupixent.  Wynonia Lawman, CMA, am acting as scribe for Elie Goody, MD .   Documentation: I have reviewed the above documentation for accuracy and completeness, and I agree with the above.  Elie Goody, MD

## 2023-02-19 NOTE — Patient Instructions (Addendum)
Dupilumab (Dupixent) is a treatment given by injection for adults and children with moderate-to-severe atopic dermatitis. Goal is control of skin condition, not cure. It is given as 2 injections at the first dose followed by 1 injection ever 2 weeks thereafter.  Young children are dosed monthly.  Potential side effects include allergic reaction, herpes infections, injection site reactions and conjunctivitis (inflammation of the eyes).  The use of Dupixent requires long term medication management, including periodic office visits.  Due to recent changes in healthcare laws, you may see results of your pathology and/or laboratory studies on MyChart before the doctors have had a chance to review them. We understand that in some cases there may be results that are confusing or concerning to you. Please understand that not all results are received at the same time and often the doctors may need to interpret multiple results in order to provide you with the best plan of care or course of treatment. Therefore, we ask that you please give [place] 2 business days to thoroughly review all your results before contacting the office for clarification. Should we see a critical lab result, you will be contacted sooner.   If You Need Anything After Your Visit  If you have any questions or concerns for your doctor, please call our main line at [phone] and press option 4 to reach your doctor's medical assistant. If no one answers, please leave a voicemail as directed and we will return your call as soon as possible. Messages left after 4 pm will be answered the following business day.   You may also send [place] a message via MyChart. We typically respond to MyChart messages within 1-2 business days.  For prescription refills, please ask your pharmacy to contact our office. Our fax number is [phone].  If you have an urgent issue when the clinic is closed that cannot wait until the next business day, you can page your  doctor at the number below.    Please note that while we do our best to be available for urgent issues outside of office hours, we are not available 24/7.   If you have an urgent issue and are unable to reach [place], you may choose to seek medical care at your doctor's office, retail clinic, urgent care center, or emergency room.  If you have a medical emergency, please immediately call 911 or go to the emergency department.  Pager Numbers  - Dr. Gwen Pounds: [phone]  - Dr. Roseanne Reno: [phone]  - Dr. Katrinka Blazing: [phone]   In the event of inclement weather, please call our main line at [phone] for an update on the status of any delays or closures.  Dermatology Medication Tips: Please keep the boxes that topical medications come in in order to help keep track of the instructions about where and how to use these. Pharmacies typically print the medication instructions only on the boxes and not directly on the medication tubes.   If your medication is too expensive, please contact our office at [phone] option 4 or send [place] a message through MyChart.   We are unable to tell what your co-pay for medications will be in advance as this is different depending on your insurance coverage. However, we may be able to find a substitute medication at lower cost or fill out paperwork to get insurance to cover a needed medication.   If a prior authorization is required to get your medication covered by your insurance company, please allow [place] 1-2 business days to complete  this process.  Drug prices often vary depending on where the prescription is filled and some pharmacies may offer cheaper prices.  The website www.goodrx.com contains coupons for medications through different pharmacies. The prices here do not account for what the cost may be with help from insurance (it may be cheaper with your insurance), but the website can give you the price if you did not use any insurance.  - You can print  the associated coupon and take it with your prescription to the pharmacy.  - You may also stop by our office during regular business hours and pick up a GoodRx coupon card.  - If you need your prescription sent electronically to a different pharmacy, notify our office through [facility] or by phone at [phone] option 4.     Si Usted Necesita Algo Despus de Su Visita  Tambin puede enviarnos un mensaje a travs de Clinical cytogeneticist. Por lo general respondemos a los mensajes de MyChart en el transcurso de 1 a 2 das hbiles.  Para renovar recetas, por favor pida a su farmacia que se ponga en contacto con nuestra oficina. Annie Sable de fax es [place] [phone].  Si tiene un asunto urgente cuando la clnica est cerrada y que no puede esperar hasta el siguiente da hbil, puede llamar/localizar a su doctor(a) al nmero que aparece a continuacin.   Por favor, tenga en cuenta que aunque hacemos todo lo posible para estar disponibles para asuntos urgentes fuera del horario de [place], no estamos disponibles las 24 horas del da, los 7 [address]  [address] de la [place].   Si tiene un problema urgente y no puede comunicarse con nosotros, puede optar por buscar atencin mdica  en el consultorio de su doctor(a), en una clnica privada, en un centro de atencin urgente o en una sala de emergencias.  Si tiene Engineer, drilling, por favor llame inmediatamente al 911 o vaya a la sala de emergencias.  Nmeros de bper  - Dr. Gwen Pounds: [phone]  - Dra. Roseanne Reno: [phone]  - Dr. Katrinka Blazing: [phone]   En caso de inclemencias del tiempo, por favor llame a Lacy Duverney principal al [phone] para una actualizacin sobre el [place] de cualquier retraso o cierre.  Consejos para la medicacin en dermatologa: Por favor, guarde las cajas en las que vienen los medicamentos de uso tpico para ayudarle a seguir las instrucciones sobre dnde y cmo usarlos. Las farmacias generalmente imprimen las  instrucciones del medicamento slo en las cajas y no directamente en los tubos del [place].   Si su medicamento es muy caro, por favor, pngase en contacto con Rolm Gala llamando al [phone] y presione la opcin 4 o envenos un mensaje a travs de Clinical cytogeneticist.   No podemos decirle cul ser su copago por los medicamentos por adelantado ya que esto es diferente dependiendo de la cobertura de su seguro. Sin embargo, es posible que podamos encontrar un medicamento sustituto a Audiological scientist un formulario para que el seguro cubra el medicamento que se considera necesario.   Si se requiere una autorizacin previa para que su compaa de seguros [place] su medicamento, por favor permtanos de 1 a 2 das hbiles para completar [address]  Los precios de los medicamentos varan con frecuencia dependiendo del Environmental consultant de dnde se surte la receta y alguna farmacias pueden ofrecer precios ms baratos.  El sitio web www.goodrx.com tiene cupones para medicamentos de Health and safety inspector. Los precios aqu no tienen en cuenta lo que podra costar con la ayuda del  seguro (puede ser ms barato con su seguro), pero el sitio web puede darle el precio si no Visual merchandiser.  - Puede imprimir el cupn correspondiente y llevarlo con su receta a la farmacia.  - Tambin puede pasar por nuestra oficina durante el horario de atencin regular y Education officer, museum una tarjeta de cupones de GoodRx.  - Si necesita que su receta se enve electrnicamente a una farmacia diferente, informe a nuestra oficina a travs de MyChart de [place] o por telfono llamando al [phone] y presione la opcin 4.

## 2023-02-23 ENCOUNTER — Encounter: Payer: Self-pay | Admitting: Dermatology

## 2023-03-04 ENCOUNTER — Other Ambulatory Visit: Payer: Self-pay

## 2023-03-04 DIAGNOSIS — R059 Cough, unspecified: Secondary | ICD-10-CM | POA: Diagnosis not present

## 2023-03-04 DIAGNOSIS — E875 Hyperkalemia: Secondary | ICD-10-CM | POA: Diagnosis present

## 2023-03-04 LAB — BASIC METABOLIC PANEL
Anion gap: 13 (ref 5–15)
BUN: 29 mg/dL — ABNORMAL HIGH (ref 8–23)
CO2: 21 mmol/L — ABNORMAL LOW (ref 22–32)
Calcium: 8.8 mg/dL — ABNORMAL LOW (ref 8.9–10.3)
Chloride: 101 mmol/L (ref 98–111)
Creatinine, Ser: 1.54 mg/dL — ABNORMAL HIGH (ref 0.61–1.24)
GFR, Estimated: 46 mL/min — ABNORMAL LOW (ref >60)
Glucose, Bld: 130 mg/dL — ABNORMAL HIGH (ref 70–99)
Potassium: 5.7 mmol/L — ABNORMAL HIGH (ref 3.5–5.1)
Sodium: 135 mmol/L (ref 135–145)

## 2023-03-04 LAB — CBC
HCT: 36.2 % — ABNORMAL LOW (ref 39.0–52.0)
Hemoglobin: 10.9 g/dL — ABNORMAL LOW (ref 13.0–17.0)
MCH: 26.9 pg (ref 26.0–34.0)
MCHC: 30.1 g/dL (ref 30.0–36.0)
MCV: 89.4 fL (ref 80.0–100.0)
Platelets: 238 10*3/uL (ref 150–400)
RBC: 4.05 MIL/uL — ABNORMAL LOW (ref 4.22–5.81)
RDW: 14.6 % (ref 11.5–15.5)
WBC: 9.3 10*3/uL (ref 4.0–10.5)
nRBC: 0 % (ref 0.0–0.2)

## 2023-03-04 LAB — TROPONIN I (HIGH SENSITIVITY): Troponin I (High Sensitivity): 8 ng/L (ref <18)

## 2023-03-04 NOTE — ED Triage Notes (Signed)
Pt to ED from [facility] c/o abnormal labs. Pt was seen at [facility] and potassium was 6.4. pt denies any CP, SHOB, dizziness, fevers.

## 2023-03-05 ENCOUNTER — Emergency Department
Admission: EM | Admit: 2023-03-05 | Discharge: 2023-03-05 | Disposition: A | Discharge status: Home / Self Care | Payer: 59 | Attending: Emergency Medicine | Admitting: Emergency Medicine

## 2023-03-05 ENCOUNTER — Ambulatory Visit: Payer: 59

## 2023-03-05 DIAGNOSIS — E875 Hyperkalemia: Secondary | ICD-10-CM | POA: Diagnosis not present

## 2023-03-05 LAB — TROPONIN I (HIGH SENSITIVITY): Troponin I (High Sensitivity): 8 ng/L (ref <18)

## 2023-03-05 LAB — POTASSIUM: Potassium: 5.1 mmol/L (ref 3.5–5.1)

## 2023-03-05 MED ORDER — BENZONATATE 100 MG PO CAPS: 100.0000 mg | ORAL_CAPSULE | Freq: Three times a day (TID) | ORAL | 0 refills | Status: DC | PRN

## 2023-03-05 NOTE — Discharge Instructions (Addendum)
As discussed your potassium has returned to the normal range. Please follow up with your doctor for further monitoring. Please seek medical attention for any high fevers, chest pain, shortness of breath, change in behavior, persistent vomiting, bloody stool or any other new or concerning symptoms.

## 2023-03-05 NOTE — ED Provider Notes (Signed)
[facility] Provider Note    Event Date/Time   First MD Initiated Contact with Patient 03/05/23 0532     (approximate)   History   Hyperkalemia   HPI  Joe Coleman is a 78 y.o. male who presents to the emergency department today from [place] clinic because of concerns for high potassium level.  Patient went to [facility] clinic for right shoulder pain.  The pain started after a fall.  He is not sure why they checked blood work.  However he was found to be hyperkalemic.  Because of this they sent him to the emergency department. He does state he has had a cough as well.   Physical Exam   Triage Vital Signs: ED Triage Vitals  Encounter Vitals Group     BP 03/04/23 1906 (!) 157/104     Systolic BP Percentile --      Diastolic BP Percentile --      Pulse Rate 03/04/23 1906 84     Resp 03/04/23 1906 18     Temp 03/04/23 1906 98 F (36.7 C)     Temp Source 03/04/23 1906 Oral     SpO2 03/04/23 1906 100 %     Weight 03/04/23 1904 152 lb (68.9 kg)     Height 03/04/23 1904 5\' 8"  (1.727 m)     Head Circumference --      Peak Flow --      Pain Score 03/04/23 1903 0     Pain Loc --      Pain Education --      Exclude from Growth Chart --     Most recent vital signs: Vitals:   03/05/23 0555 03/05/23 0600  BP: (!) 167/106 (!) 164/86  Pulse: 92 86  Resp: 19   Temp: 98.1 F (36.7 C)   SpO2: 100% 99%   General: Awake, alert, oriented. CV:  Good peripheral perfusion. Regular rate and rhythm. Resp:  Normal effort. Lungs clear. Abd:  No distention.  Other:  Full ROM of the right shoulder without tenderness.   ED Results / Procedures / Treatments   Labs (all labs ordered are listed, but only abnormal results are displayed) Labs Reviewed  CBC - Abnormal; Notable for the following components:      Result Value   RBC 4.05 (*)    Hemoglobin 10.9 (*)    HCT 36.2 (*)    All other components within normal limits  BASIC METABOLIC PANEL - Abnormal;  Notable for the following components:   Potassium 5.7 (*)    CO2 21 (*)    Glucose, Bld 130 (*)    BUN 29 (*)    Creatinine, Ser 1.54 (*)    Calcium 8.8 (*)    GFR, Estimated 46 (*)    All other components within normal limits  POTASSIUM  TROPONIN I (HIGH SENSITIVITY)  TROPONIN I (HIGH SENSITIVITY)     EKG  I, Phineas Semen, attending physician, personally viewed and interpreted this EKG  EKG Time: 1908 Rate: 83 Rhythm: normal sinus rhythm Axis: normal Intervals: qtc 387 QRS: narrow ST changes: no st elevation Impression: normal ekg    RADIOLOGY None  PROCEDURES:  Critical Care performed: No    MEDICATIONS ORDERED IN ED: Medications - No data to display   IMPRESSION / MDM / ASSESSMENT AND PLAN / ED COURSE  I reviewed the triage vital signs and the nursing notes.  Differential diagnosis includes, but is not limited to, medication effect, lab error, dietary cause  Patient's presentation is most consistent with acute presentation with potential threat to life or bodily function.   Patient presented to the emergency department today from [name] clinic because of concerns for hyperkalemia.  Patient's potassium did correct here over time without any specific intervention.  This time I do not feel patient's shoulder requires any emergent imaging given good range of motion.  Discussed with patient he can follow-up with primary care for further potassium checks.  In terms of the patient's reported cough he refused COVID and flu swab.  Will give patient prescription for cough medication.     FINAL CLINICAL IMPRESSION(S) / ED DIAGNOSES   Final diagnoses:  Hyperkalemia     Note:  This document was prepared using Dragon voice recognition software and may include unintentional dictation errors.    Phineas Semen, MD 03/05/23 [phone]

## 2023-03-31 ENCOUNTER — Ambulatory Visit: Payer: 59 | Admitting: Dermatology

## 2023-04-24 ENCOUNTER — Emergency Department

## 2023-04-24 ENCOUNTER — Encounter: Payer: Self-pay | Admitting: Emergency Medicine

## 2023-04-24 ENCOUNTER — Other Ambulatory Visit: Payer: Self-pay

## 2023-04-24 ENCOUNTER — Emergency Department
Admission: EM | Admit: 2023-04-24 | Discharge: 2023-04-24 | Disposition: A | Discharge status: Home / Self Care | Attending: Emergency Medicine | Admitting: Emergency Medicine

## 2023-04-24 DIAGNOSIS — Z87891 Personal history of nicotine dependence: Secondary | ICD-10-CM | POA: Diagnosis not present

## 2023-04-24 DIAGNOSIS — R42 Dizziness and giddiness: Secondary | ICD-10-CM | POA: Diagnosis present

## 2023-04-24 DIAGNOSIS — R519 Headache, unspecified: Secondary | ICD-10-CM | POA: Diagnosis present

## 2023-04-24 DIAGNOSIS — Z79899 Other long term (current) drug therapy: Secondary | ICD-10-CM | POA: Insufficient documentation

## 2023-04-24 DIAGNOSIS — I6789 Other cerebrovascular disease: Secondary | ICD-10-CM | POA: Diagnosis present

## 2023-04-24 DIAGNOSIS — H6122 Impacted cerumen, left ear: Secondary | ICD-10-CM | POA: Insufficient documentation

## 2023-04-24 DIAGNOSIS — Z9889 Other specified postprocedural states: Secondary | ICD-10-CM | POA: Diagnosis not present

## 2023-04-24 DIAGNOSIS — E875 Hyperkalemia: Secondary | ICD-10-CM | POA: Diagnosis not present

## 2023-04-24 DIAGNOSIS — I6782 Cerebral ischemia: Secondary | ICD-10-CM | POA: Diagnosis not present

## 2023-04-24 LAB — CBC
HCT: 34.7 % — ABNORMAL LOW (ref 39.0–52.0)
Hemoglobin: 10.3 g/dL — ABNORMAL LOW (ref 13.0–17.0)
MCH: 27 pg (ref 26.0–34.0)
MCHC: 29.7 g/dL — ABNORMAL LOW (ref 30.0–36.0)
MCV: 90.8 fL (ref 80.0–100.0)
Platelets: 202 10*3/uL (ref 150–400)
RBC: 3.82 MIL/uL — ABNORMAL LOW (ref 4.22–5.81)
RDW: 16 % — ABNORMAL HIGH (ref 11.5–15.5)
WBC: 6.8 10*3/uL (ref 4.0–10.5)
nRBC: 0 % (ref 0.0–0.2)

## 2023-04-24 LAB — COMPREHENSIVE METABOLIC PANEL
ALT: 13 U/L (ref 0–44)
AST: 21 U/L (ref 15–41)
Albumin: 3.3 g/dL — ABNORMAL LOW (ref 3.5–5.0)
Alkaline Phosphatase: 108 U/L (ref 38–126)
Anion gap: 6 (ref 5–15)
BUN: 31 mg/dL — ABNORMAL HIGH (ref 8–23)
CO2: 24 mmol/L (ref 22–32)
Calcium: 8 mg/dL — ABNORMAL LOW (ref 8.9–10.3)
Chloride: 107 mmol/L (ref 98–111)
Creatinine, Ser: 1.68 mg/dL — ABNORMAL HIGH (ref 0.61–1.24)
GFR, Estimated: 41 mL/min — ABNORMAL LOW (ref >60)
Glucose, Bld: 145 mg/dL — ABNORMAL HIGH (ref 70–99)
Potassium: 6.1 mmol/L — ABNORMAL HIGH (ref 3.5–5.1)
Sodium: 137 mmol/L (ref 135–145)
Total Bilirubin: 0.6 mg/dL (ref 0.0–1.2)
Total Protein: 6.6 g/dL (ref 6.5–8.1)

## 2023-04-24 LAB — DIFFERENTIAL
Abs Immature Granulocytes: 0.05 10*3/uL (ref 0.00–0.07)
Basophils Absolute: 0 10*3/uL (ref 0.0–0.1)
Basophils Relative: 0 %
Eosinophils Absolute: 0.2 10*3/uL (ref 0.0–0.5)
Eosinophils Relative: 3 %
Immature Granulocytes: 1 %
Lymphocytes Relative: 20 %
Lymphs Abs: 1.4 10*3/uL (ref 0.7–4.0)
Monocytes Absolute: 0.7 10*3/uL (ref 0.1–1.0)
Monocytes Relative: 11 %
Neutro Abs: 4.4 10*3/uL (ref 1.7–7.7)
Neutrophils Relative %: 65 %

## 2023-04-24 LAB — SEDIMENTATION RATE: Sed Rate: 35 mm/h — ABNORMAL HIGH (ref 0–20)

## 2023-04-24 LAB — TROPONIN I (HIGH SENSITIVITY): Troponin I (High Sensitivity): 6 ng/L (ref <18)

## 2023-04-24 LAB — ETHANOL: Alcohol, Ethyl (B): <10 mg/dL (ref <10)

## 2023-04-24 LAB — PROTIME-INR
INR: 1 (ref 0.8–1.2)
Prothrombin Time: 13.2 s (ref 11.4–15.2)

## 2023-04-24 LAB — APTT: aPTT: 26 s (ref 24–36)

## 2023-04-24 MED ORDER — DIPHENHYDRAMINE HCL 50 MG/ML IJ SOLN
12.5000 mg | Freq: Once | INTRAMUSCULAR | Status: AC
  Administered 2023-04-24: 12.5 mg via INTRAVENOUS
  Filled 2023-04-24: qty 1

## 2023-04-24 MED ORDER — PROCHLORPERAZINE MALEATE 5 MG PO TABS: 5.0000 mg | ORAL_TABLET | Freq: Four times a day (QID) | ORAL | 0 refills | Status: DC | PRN

## 2023-04-24 MED ORDER — METOCLOPRAMIDE HCL 5 MG/ML IJ SOLN
10.0000 mg | Freq: Once | INTRAMUSCULAR | Status: AC
  Administered 2023-04-24: 10 mg via INTRAVENOUS
  Filled 2023-04-24: qty 2

## 2023-04-24 MED ORDER — GADOBUTROL 1 MMOL/ML IV SOLN
7.0000 mL | Freq: Once | INTRAVENOUS | Status: AC | PRN
  Administered 2023-04-24: 7 mL via INTRAVENOUS

## 2023-04-24 MED ORDER — ACETAMINOPHEN 500 MG PO TABS
1000.0000 mg | ORAL_TABLET | Freq: Once | ORAL | Status: AC
Start: 2023-04-24 — End: 2023-04-24
  Administered 2023-04-24: 1000 mg via ORAL
  Filled 2023-04-24: qty 2

## 2023-04-24 MED ORDER — CARBAMIDE PEROXIDE 6.5 % OT SOLN
10.0000 [drp] | Freq: Once | OTIC | Status: AC
  Administered 2023-04-24: 10 [drp] via OTIC
  Filled 2023-04-24: qty 15

## 2023-04-24 MED ORDER — LOKELMA 10 G PO PACK: 10.0000 g | PACK | Freq: Every day | ORAL | 0 refills | Status: AC

## 2023-04-24 MED ORDER — SODIUM ZIRCONIUM CYCLOSILICATE 10 G PO PACK
10.0000 g | PACK | Freq: Once | ORAL | Status: AC
  Administered 2023-04-24: 10 g via ORAL
  Filled 2023-04-24: qty 1

## 2023-04-24 NOTE — ED Triage Notes (Signed)
 Patient c/o headache X3 days. Reports intermittent left eye blurry vison since headache started. Reports dizziness. C/o intermittent left eye pain  No injury. States no history of migraines.

## 2023-04-24 NOTE — Discharge Instructions (Addendum)
 We are starting you on some potassium lowering medication to take every day.  You need to follow-up with the nephrologist.  Please call them to schedule an appointment.  If your potassium levels go too high can be life-threatening.  Please call the ENT to follow-up for the wax in your ear

## 2023-04-24 NOTE — ED Provider Notes (Signed)
 [facility] Provider Note    Event Date/Time   First MD Initiated Contact with Patient 04/24/23 1326     (approximate)   History   Headache and Dizziness   HPI  Joe Coleman is a 78 y.o. male who comes in with concerns for headaches over the past 3 days.  Patient denies any history of headaches.  He reports that he has had 3 days of headaches that been gradually increasing.  The headaches are located over the left side of his head.  He states that the headache causes some discomfort in his head and his eye in his ear.  He denies any new vision changes.  He has had a prior cataract surgery on the left eye.  He reported some intermittent dizziness as well.  He has a history of smoking previously and denies any new shortness of breath, chest pain or other concerns.   Physical Exam   Triage Vital Signs: ED Triage Vitals [04/24/23 1148]  Encounter Vitals Group     BP (!) 120/108     Systolic BP Percentile      Diastolic BP Percentile      Pulse Rate 75     Resp 16     Temp 98.3 F (36.8 C)     Temp Source Oral     SpO2 99 %     Weight 158 lb (71.7 kg)     Height 5' 8.5" (1.74 m)     Head Circumference      Peak Flow      Pain Score 5     Pain Loc      Pain Education      Exclude from Growth Chart     Most recent vital signs: Vitals:   04/24/23 1148  BP: (!) 120/108  Pulse: 75  Resp: 16  Temp: 98.3 F (36.8 C)  SpO2: 99%     General: Awake, no distress.  CV:  Good peripheral perfusion.  Resp:  Normal effort.  Abd:  No distention.  Soft and nontender Other:  Cranial nerves are intact.  Equal strength in arms and legs.  Finger-nose intact.  No obvious ataxia. TMs show wax on the left ear.  Pupils are reactive bilaterally with the right pupil slightly enlarged compared to the left pupil Pressure in the eye on the left is 8.   ED Results / Procedures / Treatments   Labs (all labs ordered are listed, but only abnormal results are  displayed) Labs Reviewed  CBC - Abnormal; Notable for the following components:      Result Value   RBC 3.82 (*)    Hemoglobin 10.3 (*)    HCT 34.7 (*)    MCHC 29.7 (*)    RDW 16.0 (*)    All other components within normal limits  COMPREHENSIVE METABOLIC PANEL - Abnormal; Notable for the following components:   Potassium 6.1 (*)    Glucose, Bld 145 (*)    BUN 31 (*)    Creatinine, Ser 1.68 (*)    Calcium 8.0 (*)    Albumin 3.3 (*)    GFR, Estimated 41 (*)    All other components within normal limits  PROTIME-INR  APTT  DIFFERENTIAL  ETHANOL     EKG  My interpretation of EKG:  Normal sinus rate of 73 without any ST elevation or T wave inversions, normal intervals  RADIOLOGY I have reviewed the CT head personally interpreted no evidence of intracranial hemorrhage  PROCEDURES:  Critical Care performed: No  Procedures   MEDICATIONS ORDERED IN ED: Medications - No data to display   IMPRESSION / MDM / ASSESSMENT AND PLAN / ED COURSE  I reviewed the triage vital signs and the nursing notes.   Patient's presentation is most consistent with acute presentation with potential threat to life or bodily function.  Patient comes in with dizziness, headaches.  Patient is 78 years old has never had headaches before has a history of cancer.  Will get MRI brain with and without to rule out cancer, stroke given the dizziness.  I will also get MRIs to evaluate for his vasculature given he has some pupil discrepancy this could be from his prior cataract surgery but he has no recollection of this and I want to ensure that there is no aneurysm although this seems less likely given it was a gradually worsening headache.  I had considered doing CT angiogram but given patient's elevated potassium I discussed with Dr. Cherylann Ratel who recommended MRI contrast would be safer over the CT contrast.  Dr. Lourdes Sledge also recommended starting patient on Lokelma daily for his hyperkalemia and having him  follow-up outpatient with them.  I will give a dose here.  Patient does have a cerumen impaction noted on the left which could cause some of the dizziness and ear pain therefore we can try to irrigate it here and have him follow-up with the ENT.  There was some comment of paranasal sinus disease as well and he denies any obvious symptoms but we could start him on some Augmentin if workup is otherwise reassuring.  Patient will be handed off to oncoming team pending MRIs  Alcohol is negative.  Coags normal.  CBC shows stable hemoglobin of 10.3 around patient's baseline.  Patient's potassium is elevated at 6.1 with a creatinine of 1.68.  IMPRESSION: 1. No evidence of an acute intracranial abnormality. 2. 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 3. Paranasal sinus disease as described.     FINAL CLINICAL IMPRESSION(S) / ED DIAGNOSES   Final diagnoses:  None     Rx / DC Orders   ED Discharge Orders     None        Note:  This document was prepared using Dragon voice recognition software and may include unintentional dictation errors.   Concha Se, MD 04/24/23 1500

## 2023-04-24 NOTE — ED Provider Notes (Signed)
-----------------------------------------   3:20 PM on 04/24/2023 -----------------------------------------  Blood pressure (!) 176/92, pulse 72, temperature 98.3 F (36.8 C), temperature source Oral, resp. rate 16, height 5' 8.5" (1.74 m), weight 71.7 kg, SpO2 99%.  Assuming care from Dr. Fuller Plan.  In short, Joe Coleman is a 78 y.o. male with a chief complaint of Headache and Dizziness .  Refer to the original H&P for additional details.  The current plan of care is to follow-up MRI brain and MRA for headache with enlarged pupil on right.  ----------------------------------------- 6:32 PM on 04/24/2023 ----------------------------------------- MRI and MRA of brain without acute abnormality, large vessel occlusion, or significant stenosis.  ESR mildly elevated but not to the level that I would expect with temporal arteritis and patient has no temporal tenderness on reassessment.  He does state that his headache is slightly improved and he is appropriate for discharge home with outpatient neurology follow-up.  Follow-up was also arranged with nephrology for patient's hyperkalemia, prescription sent for [facility].  Patient was counseled to have labs rechecked in about 1 week for hyperkalemia and to return to the ED for new or worsening symptoms.  Patient agrees with plan.       Chesley Noon, MD 04/24/23 [phone]

## 2023-04-24 NOTE — ED Notes (Signed)
 See triage note. Presents with intermittent headache and dizziness  States this started about 3 days ago Also having some pain in left eye

## 2023-05-20 NOTE — Progress Notes (Signed)
 Give headaches- coags order in case ich

## 2023-08-08 ENCOUNTER — Ambulatory Visit
Admission: EM | Admit: 2023-08-08 | Discharge: 2023-08-08 | Disposition: A | Discharge status: Home / Self Care | Attending: Family Medicine | Admitting: Family Medicine

## 2023-08-08 DIAGNOSIS — L03113 Cellulitis of right upper limb: Secondary | ICD-10-CM

## 2023-08-08 MED ORDER — MUPIROCIN 2 % EX OINT: 1.0000 | TOPICAL_OINTMENT | Freq: Two times a day (BID) | CUTANEOUS | 0 refills | Status: AC

## 2023-08-08 MED ORDER — DOXYCYCLINE HYCLATE 100 MG PO TABS: 100.0000 mg | ORAL_TABLET | Freq: Two times a day (BID) | ORAL | 0 refills | Status: DC

## 2023-08-08 NOTE — ED Triage Notes (Signed)
 Pt c/o wound along right elbow and forearm x49months  Pt states that he fell and injured his right arm. Pt had a skin tear and used a [employer] grease rag to tie his skin down. Pt now has blisters covering the wound and redness from mid forearm to mid tricep.  Pt has been taking a left over antibiotic for his wound - pt does not know the name of the medication and states he received it at the hospital.

## 2023-08-08 NOTE — Discharge Instructions (Signed)
 If redness worsens or you develop fever, go directly to the ER.

## 2023-08-08 NOTE — ED Provider Notes (Signed)
 [facility]    CSN: [id number] Arrival date & time: 08/08/23  1616      History   Chief Complaint Chief Complaint  Patient presents with   Wound Check    HPI 78 year old male presents for evaluation of the above.  Patient states that he fell 2 months ago and suffered a skin tear to his right arm/forearm.  Wound has improved but he now has significant erythema which is spreading proximally and distally.  He has been taking an oral antibiotic that he had leftover but does not know what it is.  He also states that he has been applying topical antibiotic ointment without resolution.  No fever.  Past Medical History:  Diagnosis Date   Cancer (HCC)    stomach and colon   Diabetes mellitus without complication (HCC)    Hypertension     Patient Active Problem List   Diagnosis Date Noted   Eye pain, bilateral 07/26/2022   Fall 07/26/2022   Hyponatremia 07/26/2022   Lymphopenia 07/26/2022   Shortness of breath 05/21/2022   AKI (acute kidney injury) (HCC) 03/17/2020   COVID-19 virus infection 03/17/2020   COPD with acute exacerbation (HCC) 03/17/2020   Diabetes (HCC) 03/17/2020   HTN (hypertension) 03/17/2020   B12 deficiency 10/05/2019   Erectile dysfunction 11/10/2017   Chronic kidney disease (CKD), stage III (moderate) (HCC) 09/19/2017   Mild intermittent asthma without complication 09/19/2017   Chronic cough 10/22/2016   Epigastric pain 06/30/2015   Chronic bilateral low back pain without sciatica 05/31/2015   Neuropathic pain 03/01/2015   Benign prostatic hyperplasia with lower urinary tract symptoms 04/06/2014   Adenomatous colon polyp 08/31/2013   Depression 06/21/2013   Foot pain, bilateral 05/07/2013   Insomnia 03/24/2013   Vitiligo 12/23/2012   Primary localized osteoarthrosis, lower leg 01/15/2011   Pain medication agreement broken 09/19/2010   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 Authorizing Provider  aspirin  EC 81 MG tablet Take 81 mg by mouth daily.   Yes [provider]  doxycycline  (VIBRA -TABS) 100 MG tablet Take 1 tablet (100 mg total) by mouth 2 (two) times daily. 08/08/23  Yes Summit Borchardt G, DO  gabapentin  (NEURONTIN ) 300 MG capsule Take 300 mg by mouth at bedtime.   Yes [provider]  metFORMIN  (GLUCOPHAGE ) 500 MG tablet Take by mouth 2 (two) times daily with a meal.   Yes [provider]  mupirocin  ointment (BACTROBAN ) 2 % Apply 1 Application topically 2 (two) times daily for 7 days. 08/08/23 08/15/23 Yes Crandall Harvel G, DO  albuterol  (PROVENTIL  HFA;VENTOLIN  HFA) 108 (90 Base) MCG/ACT inhaler Inhale 2 puffs into the lungs every 6 (six) hours as needed for wheezing or shortness of breath. 04/01/16   Lang Dover, MD  amLODipine (NORVASC) 5 MG tablet Take 5 mg by mouth daily. 02/25/20   [provider]  benzonatate  (TESSALON  PERLES) 100 MG capsule Take 1 capsule (100 mg total) by mouth 3 (three) times daily as needed for cough. 03/05/23 03/04/24  Floy Roberts, MD  benzonatate  (TESSALON ) 100 MG capsule Take 1 capsule (100 mg total) by mouth every 8 (eight) hours. 02/14/22  White, Adrienne R, NP  clobetasol  cream (TEMOVATE ) 0.05 % Apply 1 Application topically 2 (two) times daily. For up to 2 weeks as needed for itch. Avoid applying to face, groin, and axilla. Use as directed. Long-term use can cause thinning of the skin. 02/06/23   Claudene Lehmann, MD  guaiFENesin -codeine  100-10 MG/5ML syrup Take 5 mLs by mouth every 6 (six) hours as needed for cough. 10/09/22   Dorothyann Drivers, MD  Nirmatrelvir & Ritonavir (PAXLOVID) 20 x 150 MG & 10 x 100MG  TBPK Take 1 tablet by mouth as directed.  03/14/20   [provider]  pantoprazole  (PROTONIX ) 40 MG tablet Take 1 tablet (40 mg total) by mouth daily. 03/20/20 04/19/20  Trudy Anthony HERO, MD  prochlorperazine  (COMPAZINE ) 5 MG tablet Take 1 tablet (5 mg total) by mouth every 6 (six) hours as needed for nausea or vomiting (or vomiting). 04/24/23   Willo Dunnings, MD  pyrithione zinc  (HEAD AND SHOULDERS) 1 % shampoo Apply topically daily as needed for itching. 01/19/22   Cyrena Mylar, MD    Family History Family History  Problem Relation Age of Onset   Heart disease Mother    Heart disease Brother     Social History Social History   Tobacco Use   Smoking status: Former   Smokeless tobacco: Never  Vaping Use   Vaping status: Never Used  Substance Use Topics   Alcohol use: No   Drug use: No     Allergies   Chicken allergy, Losartan, and Poultry meal   Review of Systems Review of Systems Per HPI  Physical Exam Triage Vital Signs ED Triage Vitals  Encounter Vitals Group     BP 08/08/23 1639 (!) 146/86     Girls Systolic BP Percentile --      Girls Diastolic BP Percentile --      Boys Systolic BP Percentile --      Boys Diastolic BP Percentile --      Pulse Rate 08/08/23 1639 90     Resp --      Temp 08/08/23 1639 98.6 F (37 C)     Temp Source 08/08/23 1639 Oral     SpO2 08/08/23 1639 92 %     Weight 08/08/23 1637 145 lb (65.8 kg)     Height 08/08/23 1637 5' 8 (1.727 m)     Head Circumference --      Peak Flow --      Pain Score 08/08/23 1637 10     Pain Loc --      Pain Education --      Exclude from Growth Chart --    No data found.  Updated Vital Signs BP (!) 146/86 (BP Location: Left Arm)   Pulse 90   Temp 98.6 F (37 C) (Oral)   Ht 5' 8 (1.727 m)   Wt 65.8 kg   SpO2 92%   BMI 22.05 kg/m   Visual Acuity Right Eye Distance:   Left Eye Distance:   Bilateral Distance:    Right Eye Near:   Left Eye Near:    Bilateral Near:     Physical Exam Vitals and nursing note reviewed.   Constitutional:      General: He is not in acute distress. HENT:     Head: Normocephalic and atraumatic.  Pulmonary:     Effort: Pulmonary effort is normal. No respiratory distress.  Skin:    Comments: Dorsal aspect of the right forearm with superficial healing wound with surrounding erythema  proximally and distally.  Neurological:     Mental Status: He is alert.      UC Treatments / Results  Labs (all labs ordered are listed, but only abnormal results are displayed) Labs Reviewed - No data to display  EKG   Radiology No results found.  Procedures Procedures (including critical care time)  Medications Ordered in UC Medications - No data to display  Initial Impression / Assessment and Plan / UC Course  I have reviewed the triage vital signs and the nursing notes.  Pertinent labs & imaging results that were available during my care of the patient were reviewed by me and considered in my medical decision making (see chart for details).    78 year old male presents with cellulitis of the right upper extremity.  Doxycycline  and Bactroban  as prescribed.  Advised patient that if redness worsens or he develops a fever, he should go directly to the ER as he will have failed outpatient therapy and will likely need admission and IV antibiotics.  Final Clinical Impressions(s) / UC Diagnoses   Final diagnoses:  Cellulitis of right upper extremity     Discharge Instructions      If redness worsens or you develop fever, go directly to the ER.   ED Prescriptions     Medication Sig Dispense Auth. Provider   doxycycline  (VIBRA -TABS) 100 MG tablet Take 1 tablet (100 mg total) by mouth 2 (two) times daily. 20 tablet Konner Saiz G, DO   mupirocin  ointment (BACTROBAN ) 2 % Apply 1 Application topically 2 (two) times daily for 7 days. 30 g Nejla Reasor G, DO      PDMP not reviewed this encounter.   Lachina Salsberry G, [place] 08/08/23 1656

## 2023-08-10 ENCOUNTER — Emergency Department

## 2023-08-10 ENCOUNTER — Other Ambulatory Visit: Payer: Self-pay

## 2023-08-10 ENCOUNTER — Inpatient Hospital Stay
Admission: EM | Admit: 2023-08-10 | Discharge: 2023-08-12 | DRG: 603 | Disposition: A | Discharge status: Home / Self Care | Source: Other Acute Inpatient Hospital | Attending: Student | Admitting: Student

## 2023-08-10 DIAGNOSIS — L03113 Cellulitis of right upper limb: Secondary | ICD-10-CM | POA: Diagnosis not present

## 2023-08-10 DIAGNOSIS — Z7984 Long term (current) use of oral hypoglycemic drugs: Secondary | ICD-10-CM

## 2023-08-10 DIAGNOSIS — S41111D Laceration without foreign body of right upper arm, subsequent encounter: Secondary | ICD-10-CM

## 2023-08-10 DIAGNOSIS — Z888 Allergy status to other drugs, medicaments and biological substances status: Secondary | ICD-10-CM

## 2023-08-10 DIAGNOSIS — Z85038 Personal history of other malignant neoplasm of large intestine: Secondary | ICD-10-CM

## 2023-08-10 DIAGNOSIS — N1831 Chronic kidney disease, stage 3a: Secondary | ICD-10-CM | POA: Diagnosis present

## 2023-08-10 DIAGNOSIS — Z8249 Family history of ischemic heart disease and other diseases of the circulatory system: Secondary | ICD-10-CM

## 2023-08-10 DIAGNOSIS — N179 Acute kidney failure, unspecified: Secondary | ICD-10-CM | POA: Diagnosis present

## 2023-08-10 DIAGNOSIS — D631 Anemia in chronic kidney disease: Secondary | ICD-10-CM | POA: Diagnosis present

## 2023-08-10 DIAGNOSIS — G629 Polyneuropathy, unspecified: Secondary | ICD-10-CM | POA: Diagnosis present

## 2023-08-10 DIAGNOSIS — I129 Hypertensive chronic kidney disease with stage 1 through stage 4 chronic kidney disease, or unspecified chronic kidney disease: Secondary | ICD-10-CM | POA: Diagnosis present

## 2023-08-10 DIAGNOSIS — M792 Neuralgia and neuritis, unspecified: Secondary | ICD-10-CM | POA: Diagnosis present

## 2023-08-10 DIAGNOSIS — E119 Type 2 diabetes mellitus without complications: Secondary | ICD-10-CM

## 2023-08-10 DIAGNOSIS — R739 Hyperglycemia, unspecified: Secondary | ICD-10-CM | POA: Diagnosis not present

## 2023-08-10 DIAGNOSIS — I10 Essential (primary) hypertension: Secondary | ICD-10-CM | POA: Diagnosis present

## 2023-08-10 DIAGNOSIS — L309 Dermatitis, unspecified: Secondary | ICD-10-CM | POA: Diagnosis present

## 2023-08-10 DIAGNOSIS — R1013 Epigastric pain: Secondary | ICD-10-CM | POA: Diagnosis present

## 2023-08-10 DIAGNOSIS — Z85028 Personal history of other malignant neoplasm of stomach: Secondary | ICD-10-CM

## 2023-08-10 DIAGNOSIS — E875 Hyperkalemia: Secondary | ICD-10-CM | POA: Diagnosis present

## 2023-08-10 DIAGNOSIS — J449 Chronic obstructive pulmonary disease, unspecified: Secondary | ICD-10-CM | POA: Diagnosis present

## 2023-08-10 DIAGNOSIS — R053 Chronic cough: Secondary | ICD-10-CM | POA: Diagnosis present

## 2023-08-10 DIAGNOSIS — E1122 Type 2 diabetes mellitus with diabetic chronic kidney disease: Secondary | ICD-10-CM | POA: Diagnosis present

## 2023-08-10 DIAGNOSIS — Z79899 Other long term (current) drug therapy: Secondary | ICD-10-CM

## 2023-08-10 DIAGNOSIS — Z91018 Allergy to other foods: Secondary | ICD-10-CM

## 2023-08-10 DIAGNOSIS — Z7951 Long term (current) use of inhaled steroids: Secondary | ICD-10-CM

## 2023-08-10 DIAGNOSIS — Z87891 Personal history of nicotine dependence: Secondary | ICD-10-CM

## 2023-08-10 DIAGNOSIS — F32A Depression, unspecified: Secondary | ICD-10-CM | POA: Diagnosis present

## 2023-08-10 DIAGNOSIS — E1165 Type 2 diabetes mellitus with hyperglycemia: Secondary | ICD-10-CM | POA: Diagnosis present

## 2023-08-10 LAB — CBG MONITORING, ED
Glucose-Capillary: 185 mg/dL — ABNORMAL HIGH (ref 70–99)
Glucose-Capillary: 214 mg/dL — ABNORMAL HIGH (ref 70–99)
Glucose-Capillary: 117 mg/dL — ABNORMAL HIGH (ref 70–99)

## 2023-08-10 LAB — CBC
HCT: 34.9 % — ABNORMAL LOW (ref 39.0–52.0)
Hemoglobin: 10.5 g/dL — ABNORMAL LOW (ref 13.0–17.0)
MCH: 26.8 pg (ref 26.0–34.0)
MCHC: 30.1 g/dL (ref 30.0–36.0)
MCV: 89 fL (ref 80.0–100.0)
Platelets: 266 K/uL (ref 150–400)
RBC: 3.92 MIL/uL — ABNORMAL LOW (ref 4.22–5.81)
RDW: 15.5 % (ref 11.5–15.5)
WBC: 9.7 K/uL (ref 4.0–10.5)
nRBC: 0 % (ref 0.0–0.2)

## 2023-08-10 LAB — BASIC METABOLIC PANEL WITH GFR
Anion gap: 8 (ref 5–15)
BUN: 31 mg/dL — ABNORMAL HIGH (ref 8–23)
CO2: 22 mmol/L (ref 22–32)
Calcium: 8.7 mg/dL — ABNORMAL LOW (ref 8.9–10.3)
Chloride: 106 mmol/L (ref 98–111)
Creatinine, Ser: 1.81 mg/dL — ABNORMAL HIGH (ref 0.61–1.24)
GFR, Estimated: 38 mL/min — ABNORMAL LOW (ref >60)
Glucose, Bld: 309 mg/dL — ABNORMAL HIGH (ref 70–99)
Potassium: 5 mmol/L (ref 3.5–5.1)
Sodium: 136 mmol/L (ref 135–145)

## 2023-08-10 LAB — LACTIC ACID, PLASMA: Lactic Acid, Venous: 1.9 mmol/L (ref 0.5–1.9)

## 2023-08-10 LAB — HEMOGLOBIN A1C
Hgb A1c MFr Bld: 7.6 % — ABNORMAL HIGH (ref 4.8–5.6)
Mean Plasma Glucose: 171.42 mg/dL

## 2023-08-10 MED ORDER — ONDANSETRON HCL 4 MG PO TABS: 4.0000 mg | ORAL_TABLET | Freq: Four times a day (QID) | ORAL | Status: DC | PRN

## 2023-08-10 MED ORDER — IOHEXOL 300 MG/ML  SOLN
60.0000 mL | Freq: Once | INTRAMUSCULAR | Status: AC | PRN
  Administered 2023-08-10: 60 mL via INTRAVENOUS

## 2023-08-10 MED ORDER — VANCOMYCIN HCL IN DEXTROSE 1-5 GM/200ML-% IV SOLN
1000.0000 mg | Freq: Once | INTRAVENOUS | Status: AC
  Administered 2023-08-10: 1000 mg via INTRAVENOUS
  Filled 2023-08-10: qty 200

## 2023-08-10 MED ORDER — ACETAMINOPHEN 500 MG PO TABS
1000.0000 mg | ORAL_TABLET | Freq: Once | ORAL | Status: DC
  Filled 2023-08-10: qty 2

## 2023-08-10 MED ORDER — GABAPENTIN 300 MG PO CAPS
300.0000 mg | ORAL_CAPSULE | Freq: Every day | ORAL | Status: DC
  Administered 2023-08-10 – 2023-08-11 (×2): 300 mg via ORAL
  Filled 2023-08-10 (×2): qty 1

## 2023-08-10 MED ORDER — VANCOMYCIN VARIABLE DOSE PER UNSTABLE RENAL FUNCTION (PHARMACIST DOSING): Status: DC

## 2023-08-10 MED ORDER — MORPHINE SULFATE (PF) 4 MG/ML IV SOLN
4.0000 mg | Freq: Once | INTRAVENOUS | Status: AC
  Administered 2023-08-10: 4 mg via INTRAVENOUS
  Filled 2023-08-10: qty 1

## 2023-08-10 MED ORDER — GABAPENTIN 300 MG PO CAPS: 300.0000 mg | ORAL_CAPSULE | Freq: Every day | ORAL | Status: DC

## 2023-08-10 MED ORDER — CEFAZOLIN SODIUM-DEXTROSE 2-4 GM/100ML-% IV SOLN
2.0000 g | Freq: Once | INTRAVENOUS | Status: AC
Start: 2023-08-10 — End: 2023-08-10
  Administered 2023-08-10: 2 g via INTRAVENOUS
  Filled 2023-08-10: qty 100

## 2023-08-10 MED ORDER — DIPHENHYDRAMINE HCL 50 MG/ML IJ SOLN
12.5000 mg | Freq: Four times a day (QID) | INTRAMUSCULAR | Status: DC | PRN
  Administered 2023-08-10 – 2023-08-12 (×3): 12.5 mg via INTRAVENOUS
  Filled 2023-08-10 (×3): qty 1

## 2023-08-10 MED ORDER — ACETAMINOPHEN 650 MG RE SUPP: 650.0000 mg | Freq: Four times a day (QID) | RECTAL | Status: DC | PRN

## 2023-08-10 MED ORDER — VANCOMYCIN HCL IN DEXTROSE 1-5 GM/200ML-% IV SOLN: 1000.0000 mg | INTRAVENOUS | Status: DC

## 2023-08-10 MED ORDER — SODIUM CHLORIDE 0.9 % IV BOLUS
1000.0000 mL | Freq: Once | INTRAVENOUS | Status: AC
  Administered 2023-08-10: 1000 mL via INTRAVENOUS

## 2023-08-10 MED ORDER — ONDANSETRON HCL 4 MG/2ML IJ SOLN: 4.0000 mg | Freq: Four times a day (QID) | INTRAMUSCULAR | Status: DC | PRN

## 2023-08-10 MED ORDER — CEFAZOLIN SODIUM-DEXTROSE 2-4 GM/100ML-% IV SOLN
2.0000 g | Freq: Three times a day (TID) | INTRAVENOUS | Status: DC
  Administered 2023-08-10 – 2023-08-12 (×5): 2 g via INTRAVENOUS
  Filled 2023-08-10 (×5): qty 100

## 2023-08-10 MED ORDER — OXYCODONE HCL 5 MG PO TABS
5.0000 mg | ORAL_TABLET | ORAL | Status: DC | PRN
  Administered 2023-08-11 – 2023-08-12 (×3): 5 mg via ORAL
  Filled 2023-08-10 (×3): qty 1

## 2023-08-10 MED ORDER — KETOROLAC TROMETHAMINE 15 MG/ML IJ SOLN: 15.0000 mg | Freq: Once | INTRAMUSCULAR | Status: DC

## 2023-08-10 MED ORDER — INSULIN ASPART 100 UNIT/ML IJ SOLN
0.0000 [IU] | Freq: Three times a day (TID) | INTRAMUSCULAR | Status: DC
  Administered 2023-08-10: 5 [IU] via SUBCUTANEOUS
  Administered 2023-08-11 (×2): 3 [IU] via SUBCUTANEOUS
  Administered 2023-08-11: 2 [IU] via SUBCUTANEOUS
  Administered 2023-08-12: 3 [IU] via SUBCUTANEOUS
  Administered 2023-08-12: 2 [IU] via SUBCUTANEOUS
  Filled 2023-08-10 (×6): qty 1

## 2023-08-10 MED ORDER — ACETAMINOPHEN 325 MG PO TABS: 650.0000 mg | ORAL_TABLET | Freq: Four times a day (QID) | ORAL | Status: DC | PRN

## 2023-08-10 MED ORDER — POLYETHYLENE GLYCOL 3350 17 G PO PACK
17.0000 g | PACK | Freq: Every day | ORAL | Status: DC | PRN
Start: 2023-08-10 — End: 2023-08-12

## 2023-08-10 MED ORDER — VANCOMYCIN HCL 1500 MG/300ML IV SOLN
1500.0000 mg | Freq: Once | INTRAVENOUS | Status: AC
  Administered 2023-08-10: 1500 mg via INTRAVENOUS
  Filled 2023-08-10: qty 300

## 2023-08-10 MED ORDER — SORBITOL 70 % SOLN
30.0000 mL | Freq: Every day | Status: DC | PRN
Start: 2023-08-10 — End: 2023-08-12

## 2023-08-10 NOTE — Consult Note (Signed)
 ED Pharmacy Antibiotic Sign Off An antibiotic consult was received from an ED provider for Vancomycin  per pharmacy dosing for cellulitis. A chart review was completed to assess appropriateness.   The following one time order(s) were placed:  Vancomycin  1gm IV x 1 dose  Further antibiotic and/or antibiotic pharmacy consults should be ordered by the admitting provider if indicated.   Thank you for allowing pharmacy to be a part of this patient's care.   Emmalia Heyboer Rodriguez-Guzman PharmD, BCPS 08/10/2023 4:05 PM

## 2023-08-10 NOTE — Care Management Obs Status (Signed)
 MEDICARE OBSERVATION STATUS NOTIFICATION   Patient Details  Name: Joe Coleman MRN: [id number] Date of Birth: [birth year]   Medicare Observation Status Notification Given:  Yes    [place] L Tamon Parkerson, LCSW 08/10/2023, 3:45 PM

## 2023-08-10 NOTE — Assessment & Plan Note (Signed)
 The patient fell 2 months ago and suffered a severe skin tear to the right arm. At the time he states that he wrapped the open wound with a rag at work to stop the bleeding. Since that time it has only gotten worse, with the erythema spreading up and down the arm.The patient was seen at an urgent care on 08/08/2023. He was placed on doxycycline  and mupirocin  ointment. The arm has only been getting more red and more painful. He denies fevers or chills.

## 2023-08-10 NOTE — Assessment & Plan Note (Signed)
 The patient states that he takes metformin  only for his diabetes. His glucose is over 300 upon presentation today. His glucoses will be followed with FSBS and SSI.

## 2023-08-10 NOTE — ED Notes (Signed)
 Pt eating meal tray at this time

## 2023-08-10 NOTE — ED Provider Notes (Signed)
 [facility] Provider Note    Event Date/Time   First MD Initiated Contact with Patient 08/10/23 1138     (approximate)   History   Wound Check   HPI  Joe Coleman is a 78 y.o. male past medical history significant for CKD, COPD, depression, who presents to the emergency department with concern for right arm infection.  Patient states that he had a skin tear to his arm previously.  Worsening swelling and pain and was evaluated at urgent care on 08/08/2023 and started on 2 different antibiotics.  Was told if he did not have any improvement or worsening symptoms to come into the emergency department.  Denies fever or chills.  Throbbing pain to the right side.  No new falls or trauma.  Denies any drug or alcohol use.  No cough or shortness of breath.  On chart review patient was started on doxycycline  and Bactrim     Physical Exam   Triage Vital Signs: ED Triage Vitals [08/10/23 1102]  Encounter Vitals Group     BP 135/86     Girls Systolic BP Percentile      Girls Diastolic BP Percentile      Boys Systolic BP Percentile      Boys Diastolic BP Percentile      Pulse Rate (!) 101     Resp 20     Temp 97.9 F (36.6 C)     Temp Source Oral     SpO2 94 %     Weight 144 lb 15.9 oz (65.8 kg)     Height 5' 8 (1.727 m)     Head Circumference      Peak Flow      Pain Score 5     Pain Loc      Pain Education      Exclude from Growth Chart     Most recent vital signs: Vitals:   08/10/23 1102 08/10/23 1208  BP: 135/86 138/89  Pulse: (!) 101 79  Resp: 20 17  Temp: 97.9 F (36.6 C)   SpO2: 94% 100%    Physical Exam Constitutional:      Appearance: He is well-developed.  HENT:     Head: Atraumatic.  Eyes:     Conjunctiva/sclera: Conjunctivae normal.  Cardiovascular:     Rate and Rhythm: Regular rhythm.  Pulmonary:     Effort: No respiratory distress.  Musculoskeletal:     Cervical back: Normal range of motion.  Skin:    General: Skin is  warm.     Findings: Rash present.     Comments: Erythema and warmth of the right upper extremity.  Wound over the elbow.  Does have underlying tenderness to palpation.  No obvious fluctuance.  No crepitance   Neurological:     Mental Status: He is alert. Mental status is at baseline.       IMPRESSION / MDM / ASSESSMENT AND PLAN / ED COURSE  I reviewed the triage vital signs and the nursing notes.  Differential diagnosis including cellulitis that has failed outpatient antibiotics, acute wound, abscess, malignancy  RADIOLOGY I independently reviewed imaging, my interpretation of imaging: CT scan right upper extremity without an obvious abscess.  Read as findings concerning for cellulitis but no obvious abscess or necrotizing soft tissue infection  LABS (all labs ordered are listed, but only abnormal results are displayed) Labs interpreted as -    Labs Reviewed  BASIC METABOLIC PANEL WITH GFR - Abnormal; Notable for  the following components:      Result Value   Glucose, Bld 309 (*)    BUN 31 (*)    Creatinine, Ser 1.81 (*)    Calcium 8.7 (*)    GFR, Estimated 38 (*)    All other components within normal limits  CBC - Abnormal; Notable for the following components:   RBC 3.92 (*)    Hemoglobin 10.5 (*)    HCT 34.9 (*)    All other components within normal limits  LACTIC ACID, PLASMA  LACTIC ACID, PLASMA     MDM  No significant leukocytosis and normal lactic acid.  Hyperglycemia but no concern for DKA.  Given IV fluids and pain medication.  Concerned that the patient has right upper extremity cellulitis that has failed outpatient antibiotics after 2 days of antibiotics.  Will start on Ancef  and vancomycin  to cover for MRSA.  No obvious purulent drainage.  Consulted hospitalist for admission.     PROCEDURES:  Critical Care performed: No  Procedures  Patient's presentation is most consistent with acute presentation with potential threat to life or bodily  function.   MEDICATIONS ORDERED IN ED: Medications  acetaminophen  (TYLENOL ) tablet 1,000 mg (1,000 mg Oral Patient Refused/Not Given 08/10/23 1309)  ceFAZolin  (ANCEF ) IVPB 2g/100 mL premix (has no administration in time range)  vancomycin  (VANCOCIN ) IVPB 1000 mg/200 mL premix (has no administration in time range)  iohexol  (OMNIPAQUE ) 300 MG/ML solution 60 mL (60 mLs Intravenous Contrast Given 08/10/23 1224)  sodium chloride  0.9 % bolus 1,000 mL (1,000 mLs Intravenous New Bag/Given 08/10/23 1247)  morphine  (PF) 4 MG/ML injection 4 mg (4 mg Intravenous Given 08/10/23 1308)    FINAL CLINICAL IMPRESSION(S) / ED DIAGNOSES   Final diagnoses:  Cellulitis of right upper extremity  Hyperglycemia     Rx / DC Orders   ED Discharge Orders     None        Note:  This document was prepared using Dragon voice recognition software and may include unintentional dictation errors.   Suzanne Kirsch, MD 08/10/23 1336

## 2023-08-10 NOTE — Assessment & Plan Note (Signed)
 The patient does have a notable cough. He states that this is chronic and has not changed. He takes an oxycodone  syrup for it at home.

## 2023-08-10 NOTE — ED Triage Notes (Signed)
 Pt to ED via POV from home. Pt reports right arm skin tear from a fall x2 months. Pt reports was seen at [facility] on 7/4 and advised if worsening symptoms to come to ED. Pt has blistering wound to right forearm and redness noted to arm. Pt reports pain 5/10.

## 2023-08-10 NOTE — Progress Notes (Signed)
 Pharmacy Antibiotic Note  Joe Coleman is a 78 y.o. male admitted on 08/10/2023 with cellulitis. Patient presenting with increased redness and swelling to wound to right upper extremity s/p severe laceration approx. 2 months ago. PMH significant for DM II, hypertension, stomach cancer, colon cancer. In ED, patient is afebrile with no leukocytosis. Pharmacy has been consulted for vancomycin  dosing.  Plan: Give vancomycin  load of 1500 mg IV x1 Will dose vancomycin  per levels, given unstable renal function on admission Monitor renal function, clinical status, culture data, and LOT  Height: 5' 8 (172.7 cm) Weight: 65.8 kg (144 lb 15.9 oz) IBW/kg (Calculated) : 68.4  Temp (24hrs), Avg:97.8 F (36.6 C), Min:97.7 F (36.5 C), Max:97.9 F (36.6 C)  Recent Labs  Lab 08/10/23 1102 08/10/23 1209  WBC 9.7  --   CREATININE 1.81*  --   LATICACIDVEN  --  1.9    Estimated Creatinine Clearance: 30.8 mL/min (A) (by C-G formula based on SCr of 1.81 mg/dL (H)).    Allergies  Allergen Reactions   Chicken Allergy Other (See Comments)    Other reaction(s): VOMITING Other reaction(s): VOMITING    Losartan     Hyperkalemia (>5.5 multiple times on losartan 25mg )   Poultry Meal Nausea And Vomiting   Antimicrobials this admission: vancomycin  7/6 >>   Dose adjustments this admission: N/A  Microbiology results: N/A  Thank you for involving pharmacy in this patient's care.   Damien Napoleon, PharmD Clinical Pharmacist 08/10/2023 7:43 PM

## 2023-08-10 NOTE — H&P (Signed)
 History and Physical    Patient: Joe Coleman FMW:[id number] DOB: 05-Jan-1946 DOA: 08/10/2023 DOS: the patient was seen and examined on 08/10/2023 PCP: [facility]  Patient coming from: Home  Chief Complaint:  Chief Complaint  Patient presents with   Wound Check   HPI: Joe Coleman is a 78 y.o. male with medical history significant of DM II, hypertension, stomach cancer, colon cancer. The patient fell 2 months ago and suffered a severe skin tear to the right arm. At the time he states that he wrapped the open wound with a rag at work to stop the bleeding. Since that time it has only gotten worse, with the erythema spreading up and down the arm.The patient was seen at an urgent care on 08/08/2023. He was placed on doxycycline  and mupirocin  ointment. The arm has only been getting more red and more painful. He denies fevers or chills.   The patient is being admitted to observation status. He is receiving vancomycin  and ancef . Review of Systems: As mentioned in the history of present illness. All other systems reviewed and are negative. Past Medical History:  Diagnosis Date   Cancer (HCC)    stomach and colon   Diabetes mellitus without complication (HCC)    Hypertension    Past Surgical History:  Procedure Laterality Date   APPENDECTOMY     COLON SURGERY     Social History:  reports that he has quit smoking. He has never used smokeless tobacco. He reports that he does not drink alcohol and does not use drugs.  Allergies  Allergen Reactions   Chicken Allergy Other (See Comments)    Other reaction(s): VOMITING Other reaction(s): VOMITING    Losartan     Hyperkalemia (>5.5 multiple times on losartan 25mg )   Poultry Meal Nausea And Vomiting    Family History  Problem Relation Age of Onset   Heart disease Mother    Heart disease Brother     Prior to Admission medications   Medication Sig Start Date End Date Taking? Authorizing Provider  albuterol  (PROVENTIL   HFA;VENTOLIN  HFA) 108 (90 Base) MCG/ACT inhaler Inhale 2 puffs into the lungs every 6 (six) hours as needed for wheezing or shortness of breath. 04/01/16  Yes Lang Dover, MD  clobetasol  cream (TEMOVATE ) 0.05 % Apply 1 Application topically 2 (two) times daily. For up to 2 weeks as needed for itch. Avoid applying to face, groin, and axilla. Use as directed. Long-term use can cause thinning of the skin. 02/06/23  Yes Claudene Lehmann, MD  doxycycline  (VIBRA -TABS) 100 MG tablet Take 1 tablet (100 mg total) by mouth 2 (two) times daily. 08/08/23  Yes Cook, Jayce G, DO  gabapentin  (NEURONTIN ) 300 MG capsule Take 300 mg by mouth at bedtime.   Yes [provider]  HYDROcodone bit-homatropine (HYCODAN) 5-1.5 MG/5ML syrup Take 5 mLs by mouth at bedtime as needed.   Yes [provider]  mupirocin  ointment (BACTROBAN ) 2 % Apply 1 Application topically 2 (two) times daily for 7 days. 08/08/23 08/15/23 Yes Cook, Jayce G, DO  TRELEGY ELLIPTA 200-62.5-25 MCG/ACT AEPB Inhale 1 puff into the lungs daily. 07/17/23  Yes [provider]    Physical Exam: Vitals:   08/10/23 1534 08/10/23 1600 08/10/23 1730 08/10/23 1800  BP:  139/82 125/67 120/81  Pulse:  67    Resp:  17    Temp: 97.7 F (36.5 C)     TempSrc: Oral     SpO2:  100%  Weight:      Height:       Exam:  Constitutional:  The patient is awake, alert, and oriented x 3. No acute distress. Eyes:  pupils and irises appear normal Normal lids and conjunctivae ENMT:  grossly normal hearing  Lips appear normal external ears, nose appear normal Oropharynx: mucosa, tongue,posterior pharynx appear normal Neck:  neck appears normal, no masses, normal ROM, supple no thyromegaly Respiratory:  No increased work of breathing. No wheezes or rales Positive for coarse rhonchi throughout. No tactile fremitus Cardiovascular:  Regular rate and rhythm No murmurs, ectopy, or gallups. No lateral PMI. No thrills. Abdomen:   Abdomen is soft, non-tender, non-distended No hernias, masses, or organomegaly Normoactive bowel sounds.  Musculoskeletal:  No cyanosis, clubbing, or edema Skin:  The right arm at the elbow as well as proximal and distal to the elbow is erythematous and warm and painful to touch. Just proximal to the elbow is the site of the skin tear that appears somewhat ulcerated. No drainage is seen. palpation of skin: no induration or nodules Neurologic:  CN 2-12 intact Sensation all 4 extremities intact Psychiatric:  Mental status Mood, affect appropriate Orientation to person, place, time  judgment and insight appear intact  Data Reviewed:  CBC CMP Ct right arm  Assessment and Plan: Cellulitis of right arm The patient fell 2 months ago and suffered a severe skin tear to the right arm. At the time he states that he wrapped the open wound with a rag at work to stop the bleeding. Since that time it has only gotten worse, with the erythema spreading up and down the arm.The patient was seen at an urgent care on 08/08/2023. He was placed on doxycycline  and mupirocin  ointment. The arm has only been getting more red and more painful. He denies fevers or chills.   Diabetes [facility]) The patient states that he takes metformin  only for his diabetes. His glucose is over 300 upon presentation today. His glucoses will be followed with FSBS and SSI.  Chronic cough The patient does have a notable cough. He states that this is chronic and has not changed. He takes an oxycodone  syrup for it at home.   History of Colon and stomach cancer Noted.  Hypertension: The patient is currently normotensive.  I have seen and examined this patient myself. I have spent 60 minutes in his evaluation and admission.   Advance Care Planning:   Code Status: Full Code Confirmed verbally at bedside.  Consults: None   Family Communication: None available  Severity of Illness: The appropriate patient status for this patient  is OBSERVATION. Observation status is judged to be reasonable and necessary in order to provide the required intensity of service to ensure the patient's safety. The patient's presenting symptoms, physical exam findings, and initial radiographic and laboratory data in the context of their medical condition is felt to place them at decreased risk for further clinical deterioration. Furthermore, it is anticipated that the patient will be medically stable for discharge from the hospital within 2 midnights of admission.   Author: Laquisha Northcraft, DO 08/10/2023 7:27 PM  For on call review [email or web address].

## 2023-08-10 NOTE — ED Notes (Signed)
 PT reports itching all over. No rash noted. Message with same sent to [place], DO.

## 2023-08-11 DIAGNOSIS — Z85028 Personal history of other malignant neoplasm of stomach: Secondary | ICD-10-CM | POA: Diagnosis not present

## 2023-08-11 DIAGNOSIS — G629 Polyneuropathy, unspecified: Secondary | ICD-10-CM | POA: Diagnosis present

## 2023-08-11 DIAGNOSIS — I129 Hypertensive chronic kidney disease with stage 1 through stage 4 chronic kidney disease, or unspecified chronic kidney disease: Secondary | ICD-10-CM | POA: Diagnosis present

## 2023-08-11 DIAGNOSIS — N1831 Chronic kidney disease, stage 3a: Secondary | ICD-10-CM | POA: Diagnosis present

## 2023-08-11 DIAGNOSIS — Z7951 Long term (current) use of inhaled steroids: Secondary | ICD-10-CM | POA: Diagnosis not present

## 2023-08-11 DIAGNOSIS — R1013 Epigastric pain: Secondary | ICD-10-CM | POA: Diagnosis present

## 2023-08-11 DIAGNOSIS — Z91018 Allergy to other foods: Secondary | ICD-10-CM | POA: Diagnosis not present

## 2023-08-11 DIAGNOSIS — J449 Chronic obstructive pulmonary disease, unspecified: Secondary | ICD-10-CM | POA: Diagnosis present

## 2023-08-11 DIAGNOSIS — Z888 Allergy status to other drugs, medicaments and biological substances status: Secondary | ICD-10-CM | POA: Diagnosis not present

## 2023-08-11 DIAGNOSIS — N179 Acute kidney failure, unspecified: Secondary | ICD-10-CM | POA: Diagnosis present

## 2023-08-11 DIAGNOSIS — R739 Hyperglycemia, unspecified: Secondary | ICD-10-CM | POA: Diagnosis present

## 2023-08-11 DIAGNOSIS — L309 Dermatitis, unspecified: Secondary | ICD-10-CM | POA: Diagnosis present

## 2023-08-11 DIAGNOSIS — E1165 Type 2 diabetes mellitus with hyperglycemia: Secondary | ICD-10-CM | POA: Diagnosis present

## 2023-08-11 DIAGNOSIS — Z87891 Personal history of nicotine dependence: Secondary | ICD-10-CM | POA: Diagnosis not present

## 2023-08-11 DIAGNOSIS — L03113 Cellulitis of right upper limb: Secondary | ICD-10-CM | POA: Diagnosis present

## 2023-08-11 DIAGNOSIS — Z85038 Personal history of other malignant neoplasm of large intestine: Secondary | ICD-10-CM | POA: Diagnosis not present

## 2023-08-11 DIAGNOSIS — E875 Hyperkalemia: Secondary | ICD-10-CM | POA: Diagnosis present

## 2023-08-11 DIAGNOSIS — Z7984 Long term (current) use of oral hypoglycemic drugs: Secondary | ICD-10-CM | POA: Diagnosis not present

## 2023-08-11 DIAGNOSIS — D631 Anemia in chronic kidney disease: Secondary | ICD-10-CM | POA: Diagnosis present

## 2023-08-11 DIAGNOSIS — S41111D Laceration without foreign body of right upper arm, subsequent encounter: Secondary | ICD-10-CM | POA: Diagnosis not present

## 2023-08-11 DIAGNOSIS — Z79899 Other long term (current) drug therapy: Secondary | ICD-10-CM | POA: Diagnosis not present

## 2023-08-11 DIAGNOSIS — F32A Depression, unspecified: Secondary | ICD-10-CM | POA: Diagnosis present

## 2023-08-11 DIAGNOSIS — Z8249 Family history of ischemic heart disease and other diseases of the circulatory system: Secondary | ICD-10-CM | POA: Diagnosis not present

## 2023-08-11 DIAGNOSIS — E1122 Type 2 diabetes mellitus with diabetic chronic kidney disease: Secondary | ICD-10-CM | POA: Diagnosis present

## 2023-08-11 LAB — BASIC METABOLIC PANEL WITH GFR
Anion gap: 9 (ref 5–15)
Anion gap: 11 (ref 5–15)
BUN: 27 mg/dL — ABNORMAL HIGH (ref 8–23)
BUN: 30 mg/dL — ABNORMAL HIGH (ref 8–23)
CO2: 21 mmol/L — ABNORMAL LOW (ref 22–32)
CO2: 23 mmol/L (ref 22–32)
Calcium: 8.9 mg/dL (ref 8.9–10.3)
Calcium: 8.6 mg/dL — ABNORMAL LOW (ref 8.9–10.3)
Chloride: 107 mmol/L (ref 98–111)
Chloride: 101 mmol/L (ref 98–111)
Creatinine, Ser: 1.47 mg/dL — ABNORMAL HIGH (ref 0.61–1.24)
Creatinine, Ser: 1.88 mg/dL — ABNORMAL HIGH (ref 0.61–1.24)
GFR, Estimated: 48 mL/min — ABNORMAL LOW (ref >60)
GFR, Estimated: 36 mL/min — ABNORMAL LOW (ref >60)
Glucose, Bld: 141 mg/dL — ABNORMAL HIGH (ref 70–99)
Glucose, Bld: 178 mg/dL — ABNORMAL HIGH (ref 70–99)
Potassium: 5.8 mmol/L — ABNORMAL HIGH (ref 3.5–5.1)
Potassium: 4.9 mmol/L (ref 3.5–5.1)
Sodium: 137 mmol/L (ref 135–145)
Sodium: 135 mmol/L (ref 135–145)

## 2023-08-11 LAB — GLUCOSE, CAPILLARY
Glucose-Capillary: 168 mg/dL — ABNORMAL HIGH (ref 70–99)
Glucose-Capillary: 188 mg/dL — ABNORMAL HIGH (ref 70–99)

## 2023-08-11 LAB — CBC
HCT: 33.8 % — ABNORMAL LOW (ref 39.0–52.0)
Hemoglobin: 10 g/dL — ABNORMAL LOW (ref 13.0–17.0)
MCH: 26.5 pg (ref 26.0–34.0)
MCHC: 29.6 g/dL — ABNORMAL LOW (ref 30.0–36.0)
MCV: 89.7 fL (ref 80.0–100.0)
Platelets: 221 K/uL (ref 150–400)
RBC: 3.77 MIL/uL — ABNORMAL LOW (ref 4.22–5.81)
RDW: 15.5 % (ref 11.5–15.5)
WBC: 10.3 K/uL (ref 4.0–10.5)
nRBC: 0 % (ref 0.0–0.2)

## 2023-08-11 LAB — CBG MONITORING, ED
Glucose-Capillary: 198 mg/dL — ABNORMAL HIGH (ref 70–99)
Glucose-Capillary: 153 mg/dL — ABNORMAL HIGH (ref 70–99)

## 2023-08-11 MED ORDER — PANTOPRAZOLE SODIUM 20 MG PO TBEC
20.0000 mg | DELAYED_RELEASE_TABLET | Freq: Every day | ORAL | Status: DC
  Administered 2023-08-12: 20 mg via ORAL
  Filled 2023-08-11: qty 1

## 2023-08-11 MED ORDER — FUROSEMIDE 10 MG/ML IJ SOLN
40.0000 mg | Freq: Two times a day (BID) | INTRAMUSCULAR | Status: DC
  Administered 2023-08-11: 40 mg via INTRAVENOUS
  Filled 2023-08-11: qty 4

## 2023-08-11 MED ORDER — ALBUTEROL SULFATE (2.5 MG/3ML) 0.083% IN NEBU: 2.5000 mg | INHALATION_SOLUTION | Freq: Four times a day (QID) | RESPIRATORY_TRACT | Status: DC | PRN

## 2023-08-11 MED ORDER — HYDROCOD POLI-CHLORPHE POLI ER 10-8 MG/5ML PO SUER
5.0000 mL | Freq: Two times a day (BID) | ORAL | Status: DC | PRN
  Administered 2023-08-11 (×2): 5 mL via ORAL
  Filled 2023-08-11 (×2): qty 5

## 2023-08-11 MED ORDER — CAMPHOR-MENTHOL 0.5-0.5 % EX LOTN: TOPICAL_LOTION | CUTANEOUS | Status: DC | PRN

## 2023-08-11 MED ORDER — BUDESON-GLYCOPYRROL-FORMOTEROL 160-9-4.8 MCG/ACT IN AERO
2.0000 | INHALATION_SPRAY | Freq: Two times a day (BID) | RESPIRATORY_TRACT | Status: DC
  Administered 2023-08-12: 2 via RESPIRATORY_TRACT
  Filled 2023-08-11: qty 5.9

## 2023-08-11 MED ORDER — SODIUM ZIRCONIUM CYCLOSILICATE 10 G PO PACK
10.0000 g | PACK | Freq: Once | ORAL | Status: AC
  Administered 2023-08-11: 10 g via ORAL
  Filled 2023-08-11: qty 1

## 2023-08-11 MED ORDER — GUAIFENESIN ER 600 MG PO TB12
600.0000 mg | ORAL_TABLET | Freq: Two times a day (BID) | ORAL | Status: DC
  Administered 2023-08-11 – 2023-08-12 (×4): 600 mg via ORAL
  Filled 2023-08-11 (×4): qty 1

## 2023-08-11 NOTE — ED Notes (Signed)
Informed RN bed assigned 

## 2023-08-11 NOTE — Progress Notes (Addendum)
 Progress Note   Patient: Joe Coleman FMW:[id number] DOB: 09/19/1945 DOA: 08/10/2023     0 DOS: the patient was seen and examined on 08/11/2023   Brief hospital course: Per admission HPI FAMOUS EISENHARDT is a 78 y.o. male with medical history significant of DM II, hypertension, stomach cancer, colon cancer. The patient fell 2 months ago and suffered a severe skin tear to the right arm. At the time he states that he wrapped the open wound with a rag at work to stop the bleeding. Since that time it has only gotten worse, with the erythema spreading up and down the arm.The patient was seen at an urgent care on 08/08/2023. He was placed on doxycycline  and mupirocin  ointment. The arm has only been getting more red and more painful. He denies fevers or chills.    The patient is being admitted to observation status. He is receiving vancomycin  and ancef .  7/7 patient with improvement in pain and redness of right upper extremity.  He was transitioned to Ancef  following.   Assessment and Plan: Cellulitis of right arm Pain and erythema improving.  Continues to have no fevers and no leukocytosis. - Will continue IV Ancef , likely transition to p.o. in the a.m. - Tylenol  and oxycodone  for pain  Hyperkalemia  Likely in the setting of renal dysfunction. -Will give dose of Lokelma  and recheck BMP at noon.  CKD stage IIIa Unclear baseline. Will continue to monitor.  1.47 serum creatinine this AM.    Type 2 diabetes A1c 7.6.  Sliding scale insulin  while inpatient Did have some elevated blood glucoses, this hospitalization possibly in the setting of infection.  .  Takes metformin  only at home per patient.  Chronic cough Takes opioid cough suppressant.  Will need further workup in the outpatient setting for this.  Normocytic anemia  HDS. 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  -F/u iron  panel   Eczema Dupixent   Hypertension Currently normotensive.  No antihypertensives noted.  History of colon  and stomach cancer Noted.   COPD Not in acute exacerbation. Continue equivalent of home inhalers.     Subjective: arm redness immproved. Pain is improving.  Having some itching of his right upper extremity.  Physical Exam: Vitals:   08/11/23 0025 08/11/23 0216 08/11/23 0547 08/11/23 0724  BP: 127/68  138/62   Pulse: 69  68   Resp: 16  17   Temp:  97.8 F (36.6 C)  98 F (36.7 C)  TempSrc:  Oral    SpO2: 100%  99%   Weight:      Height:       Physical Exam  Constitutional: In no distress.  Cardiovascular: Normal rate, regular rhythm. No lower extremity edema  Pulmonary: Non labored breathing on room air, no wheezing or rales.   Abdominal: Soft. Normal bowel sounds. Non distended and non tender Musculoskeletal: Normal range of motion.     Neurological: Alert and oriented to person, place, and time. Non focal  Skin: Skin is warm and dry. No increased WTT of RUE. Redness decrease.   Data Reviewed:     Latest Ref Rng & Units 08/11/2023    4:15 AM 08/10/2023   11:02 AM 04/24/2023   11:56 AM  CBC  WBC 4.0 - 10.5 K/uL 10.3  9.7  6.8   Hemoglobin 13.0 - 17.0 g/dL 89.9  89.4  89.6   Hematocrit 39.0 - 52.0 % 33.8  34.9  34.7   Platelets 150 - 400 K/uL 221  266  202       Latest Ref Rng & Units 08/11/2023    4:15 AM 08/10/2023   11:02 AM 04/24/2023   11:56 AM  BMP  Glucose 70 - 99 mg/dL 858  690  854   BUN 8 - 23 mg/dL 27  31  31    Creatinine 0.61 - 1.24 mg/dL 8.52  8.18  8.31   Sodium 135 - 145 mmol/L 137  136  137   Potassium 3.5 - 5.1 mmol/L 5.8  5.0  6.1   Chloride 98 - 111 mmol/L 107  106  107   CO2 22 - 32 mmol/L 21  22  24    Calcium 8.9 - 10.3 mg/dL 8.9  8.7  8.0      Family Communication: Discussed plan with patient at length.  Questions were answered and patient verbalized understanding of the plan.  Disposition: Status is: Inpatient for IV antibiotics   Planned Discharge Destination: Home    Time spent: 35 minutes   Author: Alban Pepper,  MD 08/11/2023 10:43 AM  For on call review [email or web address].

## 2023-08-12 DIAGNOSIS — L03113 Cellulitis of right upper limb: Secondary | ICD-10-CM | POA: Diagnosis not present

## 2023-08-12 LAB — BASIC METABOLIC PANEL WITH GFR
Anion gap: 10 (ref 5–15)
BUN: 29 mg/dL — ABNORMAL HIGH (ref 8–23)
CO2: 24 mmol/L (ref 22–32)
Calcium: 8.3 mg/dL — ABNORMAL LOW (ref 8.9–10.3)
Chloride: 101 mmol/L (ref 98–111)
Creatinine, Ser: 1.74 mg/dL — ABNORMAL HIGH (ref 0.61–1.24)
GFR, Estimated: 39 mL/min — ABNORMAL LOW (ref >60)
Glucose, Bld: 200 mg/dL — ABNORMAL HIGH (ref 70–99)
Potassium: 4.6 mmol/L (ref 3.5–5.1)
Sodium: 135 mmol/L (ref 135–145)

## 2023-08-12 LAB — FERRITIN: Ferritin: 26 ng/mL (ref 24–336)

## 2023-08-12 LAB — CBC
HCT: 32.4 % — ABNORMAL LOW (ref 39.0–52.0)
Hemoglobin: 10 g/dL — ABNORMAL LOW (ref 13.0–17.0)
MCH: 27.1 pg (ref 26.0–34.0)
MCHC: 30.9 g/dL (ref 30.0–36.0)
MCV: 87.8 fL (ref 80.0–100.0)
Platelets: 256 K/uL (ref 150–400)
RBC: 3.69 MIL/uL — ABNORMAL LOW (ref 4.22–5.81)
RDW: 15.4 % (ref 11.5–15.5)
WBC: 8.2 K/uL (ref 4.0–10.5)
nRBC: 0 % (ref 0.0–0.2)

## 2023-08-12 LAB — GLUCOSE, CAPILLARY
Glucose-Capillary: 171 mg/dL — ABNORMAL HIGH (ref 70–99)
Glucose-Capillary: 129 mg/dL — ABNORMAL HIGH (ref 70–99)

## 2023-08-12 LAB — IRON AND TIBC
Iron: 32 ug/dL — ABNORMAL LOW (ref 45–182)
Saturation Ratios: 9 % — ABNORMAL LOW (ref 17.9–39.5)
TIBC: 342 ug/dL (ref 250–450)
UIBC: 310 ug/dL

## 2023-08-12 MED ORDER — CEPHALEXIN 500 MG PO CAPS: 500.0000 mg | ORAL_CAPSULE | Freq: Two times a day (BID) | ORAL | 0 refills | Status: AC

## 2023-08-12 MED ORDER — LACTULOSE 10 GM/15ML PO SOLN
20.0000 g | Freq: Once | ORAL | Status: AC
  Administered 2023-08-12: 20 g via ORAL
  Filled 2023-08-12: qty 30

## 2023-08-12 MED ORDER — IRON SUCROSE 200 MG IVPB - SIMPLE MED
200.0000 mg | Freq: Once | Status: AC
  Administered 2023-08-12: 200 mg via INTRAVENOUS
  Filled 2023-08-12: qty 200

## 2023-08-12 NOTE — Discharge Instructions (Addendum)
   To schedule your colonoscopy please call [phone]  Please call Dr. Trudy and schedule a follow up appointment to make sure your arm healing properly and to ensure your kidney function is stable.   Please take your antibiotics as prescribed.

## 2023-08-12 NOTE — Progress Notes (Incomplete)
 Progress Note   Patient: Joe Coleman FMW:[id number] DOB: [birth year] DOA: 08/10/2023     1 DOS: the patient was seen and examined on 08/12/2023   Brief hospital course: Per admission HPI Joe Coleman is a 78 y.o. male with medical history significant of DM II, hypertension, stomach cancer, colon cancer. The patient fell 2 months ago and suffered a severe skin tear to the right arm. At the time he states that he wrapped the open wound with a rag at work to stop the bleeding. Since that time it has only gotten worse, with the erythema spreading up and down the arm.The patient was seen at an urgent care on 08/08/2023. He was placed on doxycycline  and mupirocin  ointment. The arm has only been getting more red and more painful. He denies fevers or chills.    The patient is being admitted to observation status. He is receiving vancomycin  and ancef .  7/7 patient with improvement in pain and redness of right upper extremity.  He was transitioned to Ancef  following.   Assessment and Plan: Cellulitis of right arm Pain and erythema improving.  Continues to have no fevers and no leukocytosis. - Will continue IV Ancef , likely transition to p.o. in the a.m. - Tylenol  and oxycodone  for pain  Hyperkalemia  Likely in the setting of renal dysfunction. -Will give dose of Lokelma  and recheck BMP at noon.  CKD stage IIIa Unclear baseline.  Serum creatinine this AM ~1.4.    Type 2 diabetes A1c 7.6.  Sliding scale insulin  while inpatient .  Takes metformin  only at home per patient.  Chronic cough Takes opioid cough suppressant.  Will need further workup in the outpatient setting for this.  Normocytic anemia  HDS. 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  -F/u iron  panel   Eczema Dupixent   Hypertension Currently normotensive.  No antihypertensives noted.  History of colon and stomach cancer Noted.   COPD Not in acute exacerbation. Continue equivalent of home inhalers.   {Tip this will not  be part of the note when signed Body mass index is 22.05 kg/m. , ,  (Optional):26781}  Subjective: arm redness immproved. Pain is improving.  Having some itching of his right upper extremity.  Physical Exam: Vitals:   08/11/23 1531 08/11/23 2012 08/12/23 0410 08/12/23 0744  BP: (!) 153/66 113/71 136/74 138/72  Pulse: 67 72 66 66  Resp: 15 16 18    Temp: (!) 97.5 F (36.4 C) 97.7 F (36.5 C) 97.7 F (36.5 C) 97.6 F (36.4 C)  TempSrc:    Oral  SpO2: 93% 97% 98% 99%  Weight:      Height:       Physical Exam  Constitutional: In no distress.  Cardiovascular: Normal rate, regular rhythm. No lower extremity edema  Pulmonary: Non labored breathing on room air, no wheezing or rales.   Abdominal: Soft. Normal bowel sounds. Non distended and non tender Musculoskeletal: Normal range of motion.     Neurological: Alert and oriented to person, place, and time. Non focal  Skin: Skin is warm and dry. No increased WTT of RUE. Redness decrease.   Data Reviewed:     Latest Ref Rng & Units 08/12/2023    5:08 AM 08/11/2023    4:15 AM 08/10/2023   11:02 AM  CBC  WBC 4.0 - 10.5 K/uL 8.2  10.3  9.7   Hemoglobin 13.0 - 17.0 g/dL 89.9  89.9  89.4   Hematocrit 39.0 - 52.0 % 32.4  33.8  34.9  Platelets 150 - 400 K/uL 256  221  266       Latest Ref Rng & Units 08/12/2023    5:08 AM 08/11/2023    7:44 PM 08/11/2023    4:15 AM  BMP  Glucose 70 - 99 mg/dL 799  821  858   BUN 8 - 23 mg/dL 29  30  27    Creatinine 0.61 - 1.24 mg/dL 8.25  8.11  8.52   Sodium 135 - 145 mmol/L 135  135  137   Potassium 3.5 - 5.1 mmol/L 4.6  4.9  5.8   Chloride 98 - 111 mmol/L 101  101  107   CO2 22 - 32 mmol/L 24  23  21    Calcium 8.9 - 10.3 mg/dL 8.3  8.6  8.9      Family Communication: Discussed plan with patient at length.  Questions were answered and patient verbalized understanding of the plan.  Disposition: Status is: Inpatient for IV antibiotics   Planned Discharge Destination: Home {Tip this will not be part  of the note when signed  DVT Prophylaxis  .,  (Optional):26781}   Time spent: 35 minutes   Author: Alban Pepper, MD 08/12/2023 8:59 AM  For on call review [email or web address].

## 2023-08-14 NOTE — Discharge Summary (Signed)
 Physician Discharge Summary   Patient: Joe Coleman MRN: [id number] DOB: 05/15/1945  Admit date:     08/10/2023  Discharge date: 08/12/2023  Discharge Physician: Alban Pepper   PCP: [facility]   Recommendations at discharge:    BMP to ensure serum creatinine stable Colonoscopy for surveillance of colon cancer  IV iron    Discharge Diagnoses: Principal Problem:   Cellulitis of right arm Active Problems:   Diabetes (HCC)   HTN (hypertension)   Chronic cough   Epigastric pain   Neuropathic pain  Resolved Problems:   * No resolved hospital problems. [facility] Course: Per admission HPI Joe Coleman is a 78 y.o. male with medical history significant of DM II, hypertension, stomach cancer, colon cancer. The patient fell 2 months ago and suffered a severe skin tear to the right arm. At the time he states that he wrapped the open wound with a rag at work to stop the bleeding. Since that time it has only gotten worse, with the erythema spreading up and down the arm.The patient was seen at an urgent care on 08/08/2023. He was placed on doxycycline  and mupirocin  ointment. The arm has only been getting more red and more painful. He denies fevers or chills.    The patient was admitted to observation status. He was given vancomycin  and ancef .   7/7 patient with improvement in pain and redness of right upper extremity.  He was transitioned to Ancef  only.   7/8 He continued to have improvement in his symptoms and was discharged home on oral antibiotics and with plan to follow up with PCP.   Assessment and Plan: Cellulitis of right arm Pain and erythema improving.  Continues to have no fevers and no leukocytosis. Transitioned to keflex  on discharge to complete course of antibiotics.    Hyperkalemia (Resolved) Likely in the setting of renal dysfunction. S/p lokelma    AKI on CKD stage IIIa Unclear baseline.  Serum creatinine 1.47>1.88>1.78 this am.  Improved this  AM. Will need BMP outpatient to ensure return to baseline.     Type 2 diabetes A1c 7.6.  Sliding scale insulin  while inpatient .  Takes metformin  only at home per patient. Discharged patient on home metformin . Further titration of antidiabetes regimen per PCP.    Chronic cough Takes opioid cough suppressant.  Will need further workup in the outpatient setting for this.   Normocytic anemia  HDS. 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  Iron /TIBC/Ferritin/ %Sat    Component Value Date/Time   IRON  32 (L) 08/12/2023 0508   TIBC 342 08/12/2023 0508   FERRITIN 26 08/12/2023 0508   IRONPCTSAT 9 (L) 08/12/2023 0508       Latest Ref Rng & Units 08/12/2023    5:08 AM 08/11/2023    4:15 AM 08/10/2023   11:02 AM  CBC  WBC 4.0 - 10.5 K/uL 8.2  10.3  9.7   Hemoglobin 13.0 - 17.0 g/dL 89.9  89.9  89.4   Hematocrit 39.0 - 52.0 % 32.4  33.8  34.9   Platelets 150 - 400 K/uL 256  221  266    Received IV iron . Will need further iron  and work up outpatient.      Eczema Dupixent    Hypertension Currently normotensive.  No antihypertensives noted.   History of colon and stomach cancer Patient is due for surveillance colonoscopy (2023). He will call [facility] to schedule this.     COPD Not in acute exacerbation. Continued on equivalent  of home inhalers. Discharged on his home inhalers.        Consultants: None Procedures performed: None  Disposition: Home Diet recommendation:  Discharge Diet Orders (From admission, onward)     Start     Ordered   08/12/23 0000  Diet - low sodium heart healthy        08/12/23 1345           Carb modified diet DISCHARGE MEDICATION: Allergies as of 08/12/2023       Reactions   Chicken Allergy Other (See Comments)   Other reaction(s): VOMITING Other reaction(s): VOMITING   Losartan    Hyperkalemia (>5.5 multiple times on losartan 25mg )   Poultry Meal Nausea And Vomiting        Medication List     STOP taking these medications     doxycycline  100 MG tablet Commonly known as: VIBRA -TABS       TAKE these medications    albuterol  108 (90 Base) MCG/ACT inhaler Commonly known as: VENTOLIN  HFA Inhale 2 puffs into the lungs every 6 (six) hours as needed for wheezing or shortness of breath.   cephALEXin  500 MG capsule Commonly known as: KEFLEX  Take 1 capsule (500 mg total) by mouth 2 (two) times daily for 7 days.   clobetasol  cream 0.05 % Commonly known as: TEMOVATE  Apply 1 Application topically 2 (two) times daily. For up to 2 weeks as needed for itch. Avoid applying to face, groin, and axilla. Use as directed. Long-term use can cause thinning of the skin.   gabapentin  300 MG capsule Commonly known as: NEURONTIN  Take 300 mg by mouth at bedtime.   HYDROcodone bit-homatropine 5-1.5 MG/5ML syrup Commonly known as: HYCODAN Take 5 mLs by mouth at bedtime as needed.   mupirocin  ointment 2 % Commonly known as: BACTROBAN  Apply 1 Application topically 2 (two) times daily for 7 days.   Trelegy Ellipta 200-62.5-25 MCG/ACT Aepb Generic drug: Fluticasone-Umeclidin-Vilant Inhale 1 puff into the lungs daily.        Follow-up Information     [facility], [place]. Call in 1 week(s).   Contact information: [address] [place] [address] [phone]                Discharge Exam: Fredricka Weights   08/10/23 1102  Weight: 65.8 kg   Physical Exam  Constitutional: In no distress.  Cardiovascular: Normal rate, regular rhythm. No lower extremity edema  Pulmonary: Non labored breathing on room air, no wheezing or rales.   Abdominal: Soft. Normal bowel sounds. Non distended and non tender Musculoskeletal: Normal range of motion.     Neurological: Alert and oriented to person, place, and time. Non focal  Skin: Skin is warm and dry. No increased WTT of RUE. Redness decrease.   Condition at discharge: improving  The results of significant diagnostics from this hospitalization (including  imaging, microbiology, ancillary and laboratory) are listed below for reference.   Imaging Studies: CT FOREARM RIGHT W CONTRAST Result Date: 08/10/2023 CLINICAL DATA:  Soft tissue mass, forearm, deep eval for infection/osteo, abscess Blistering right forearm wound with erythema. Skin tear sustained in fall 2 months ago. EXAM: CT OF THE UPPER RIGHT EXTREMITY WITH CONTRAST TECHNIQUE: Multidetector CT imaging of the right forearm was performed according to the standard protocol following intravenous contrast administration. RADIATION DOSE REDUCTION: This exam was performed according to the departmental dose-optimization program which includes automated exposure control, adjustment of the mA and/or kV according to patient size and/or use of iterative  reconstruction technique. CONTRAST:  60mL OMNIPAQUE  IOHEXOL  300 MG/ML  SOLN COMPARISON:  None recent. Limited correlation made with right wrist radiographs 04/30/2022. FINDINGS: Bones/Joint/Cartilage Examination was performed with the arm elevated over the patient's head. Images extend from the distal upper arm through the hand. There is no evidence of acute fracture, dislocation or osteomyelitis within the forearm. Mild degenerative changes at the elbow without significant elbow joint effusion. There are moderate degenerative changes at the wrist, with involvement of the distal radioulnar and radiocarpal joints and. There also degenerative changes in the hand with involvement of the 1st carpometacarpal, scaphotrapeziotrapezoidal, metacarpal phalangeal and interphalangeal joints. No large joint effusions identified. Ligaments Suboptimally assessed by CT. Muscles and Tendons No intramuscular fluid collections or abnormal enhancement identified within the forearm. The biceps and triceps tendons appear intact. The wrist tendons appear intact as evaluated by CT. No evidence of significant tenosynovitis. Soft tissues There is mild to moderate subcutaneous edema within the  dorsal and ulnar aspects of the proximal to mid forearm. No focal fluid collection, foreign body or soft tissue emphysema identified. IMPRESSION: 1. Mild to moderate subcutaneous edema within the dorsal and ulnar aspects of the proximal to mid forearm, nonspecific but compatible with cellulitis in this clinical context. No focal fluid collection, foreign body or soft tissue emphysema identified. 2. No evidence of acute fracture, dislocation or osteomyelitis within the forearm. 3. Multifocal degenerative changes at the elbow, wrist and hand as described. Electronically Signed   By: Elsie Perone M.D.   On: 08/10/2023 13:04    Microbiology: Results for orders placed or performed during the hospital encounter of 10/13/22  Resp panel by RT-PCR (RSV, Flu A&B, Covid) Anterior Nasal Swab     Status: None   Collection Time: 10/13/22 10:32 AM   Specimen: Anterior Nasal Swab  Result Value Ref Range Status   SARS Coronavirus 2 by RT PCR NEGATIVE NEGATIVE Final    Comment: (NOTE) SARS-CoV-2 target nucleic acids are NOT DETECTED.  The SARS-CoV-2 RNA is generally detectable in upper respiratory specimens during the acute phase of infection. The lowest concentration of SARS-CoV-2 viral copies this assay can detect is 138 copies/mL. A negative result does not preclude SARS-Cov-2 infection and should not be used as the sole basis for treatment or other patient management decisions. A negative result may occur with  improper specimen collection/handling, submission of specimen other than nasopharyngeal swab, presence of viral mutation(s) within the areas targeted by this assay, and inadequate number of viral copies(<138 copies/mL). A negative result must be combined with clinical observations, patient history, and epidemiological information. The expected result is Negative.  Fact Sheet for Patients:  BloggerCourse.com  Fact Sheet for Healthcare Providers:   SeriousBroker.it  This test is no t yet approved or cleared by the [place]  FDA and  has been authorized for detection and/or diagnosis of SARS-CoV-2 by FDA under an Emergency Use Authorization (EUA). This EUA will remain  in effect (meaning this test can be used) for the duration of the COVID-19 declaration under Section 564(b)(1) of the Act, 21 U.S.C.section 360bbb-3(b)(1), unless the authorization is terminated  or revoked sooner.       Influenza A by PCR NEGATIVE NEGATIVE Final   Influenza B by PCR NEGATIVE NEGATIVE Final    Comment: (NOTE) The Xpert Xpress SARS-CoV-2/FLU/RSV plus assay is intended as an aid in the diagnosis of influenza from Nasopharyngeal swab specimens and should not be used as a sole basis for treatment. Nasal washings and aspirates are unacceptable for  Xpert Xpress SARS-CoV-2/FLU/RSV testing.  Fact Sheet for Patients: BloggerCourse.com  Fact Sheet for Healthcare Providers: SeriousBroker.it  This test is not yet approved or cleared by the [place]  FDA and has been authorized for detection and/or diagnosis of SARS-CoV-2 by FDA under an Emergency Use Authorization (EUA). This EUA will remain in effect (meaning this test can be used) for the duration of the COVID-19 declaration under Section 564(b)(1) of the Act, 21 U.S.C. section 360bbb-3(b)(1), unless the authorization is terminated or revoked.     Resp Syncytial Virus by PCR NEGATIVE NEGATIVE Final    Comment: (NOTE) Fact Sheet for Patients: BloggerCourse.com  Fact Sheet for Healthcare Providers: SeriousBroker.it  This test is not yet approved or cleared by the [place]  FDA and has been authorized for detection and/or diagnosis of SARS-CoV-2 by FDA under an Emergency Use Authorization (EUA). This EUA will remain in effect (meaning this test can be used) for  the duration of the COVID-19 declaration under Section 564(b)(1) of the Act, 21 U.S.C. section 360bbb-3(b)(1), unless the authorization is terminated or revoked.  Performed at [facility], [address] [place], [place] [address]     Labs: CBC: Recent Labs  Lab 08/10/23 1102 08/11/23 0415 08/12/23 0508  WBC 9.7 10.3 8.2  HGB 10.5* 10.0* 10.0*  HCT 34.9* 33.8* 32.4*  MCV 89.0 89.7 87.8  PLT 266 221 256   Basic Metabolic Panel: Recent Labs  Lab 08/10/23 1102 08/11/23 0415 08/11/23 1944 08/12/23 0508  NA 136 137 135 135  K 5.0 5.8* 4.9 4.6  CL 106 107 101 101  CO2 22 21* 23 24  GLUCOSE 309* 141* 178* 200*  BUN 31* 27* 30* 29*  CREATININE 1.81* 1.47* 1.88* 1.74*  CALCIUM 8.7* 8.9 8.6* 8.3*   Liver Function Tests: No results for input(s): AST, ALT, ALKPHOS, BILITOT, PROT, ALBUMIN in the last 168 hours. CBG: Recent Labs  Lab 08/11/23 1138 08/11/23 1640 08/11/23 2111 08/12/23 0723 08/12/23 1130  GLUCAP 153* 168* 188* 171* 129*    Discharge time spent: greater than 30 minutes.  Signed: Alban Pepper, MD [facility] 08/14/2023

## 2023-08-21 ENCOUNTER — Encounter: Payer: Self-pay | Admitting: Emergency Medicine

## 2023-08-21 ENCOUNTER — Emergency Department

## 2023-08-21 ENCOUNTER — Emergency Department
Admission: EM | Admit: 2023-08-21 | Discharge: 2023-08-21 | Disposition: A | Discharge status: Home / Self Care | Source: Skilled Nursing Facility | Attending: Emergency Medicine | Admitting: Emergency Medicine

## 2023-08-21 ENCOUNTER — Other Ambulatory Visit: Payer: Self-pay

## 2023-08-21 DIAGNOSIS — E119 Type 2 diabetes mellitus without complications: Secondary | ICD-10-CM | POA: Insufficient documentation

## 2023-08-21 DIAGNOSIS — M79605 Pain in left leg: Secondary | ICD-10-CM | POA: Insufficient documentation

## 2023-08-21 DIAGNOSIS — I10 Essential (primary) hypertension: Secondary | ICD-10-CM | POA: Insufficient documentation

## 2023-08-21 DIAGNOSIS — M79604 Pain in right leg: Secondary | ICD-10-CM | POA: Diagnosis present

## 2023-08-21 MED ORDER — IBUPROFEN 600 MG PO TABS
600.0000 mg | ORAL_TABLET | Freq: Once | ORAL | Status: AC
  Administered 2023-08-21: 600 mg via ORAL
  Filled 2023-08-21: qty 1

## 2023-08-21 MED ORDER — OXYCODONE HCL 5 MG PO TABS: 5.0000 mg | ORAL_TABLET | Freq: Three times a day (TID) | ORAL | 0 refills | Status: DC | PRN

## 2023-08-21 NOTE — ED Triage Notes (Signed)
 Patient to ED from [facility] for bilateral leg injury. PT reports a 200 lb freezer fell onto pt's legs on Tuesday. Bruising noted to both legs. States both legs and feet in pain. Hx of diabetes.

## 2023-08-21 NOTE — ED Notes (Signed)
 See triage note  States he is having lower leg pain  States he is having pain to both lower legs/feet  States the freezer fell onto legs  No deformity noted  Bruising noted

## 2023-08-21 NOTE — ED Provider Notes (Signed)
 [facility] Emergency Department Provider Note     Event Date/Time   First MD Initiated Contact with Patient 08/21/23 1130     (approximate)   History   Leg Injury   HPI  Joe Coleman is a 78 y.o. male with a history of HTN and diabetes presents to the ED for evaluation of bilateral leg pain following a freezer falling on his legs 2 days ago.  Patient reports he was trying to load a freezer onto a trailer when it fell over onto his legs.  Patient reports initially there was no pain but has slowly increased which prompted him to seek ED evaluation.  Patient remains ambulatory.  Bruising noted to bilateral legs.     Physical Exam   Triage Vital Signs: ED Triage Vitals  Encounter Vitals Group     BP 08/21/23 1024 (!) 147/88     Girls Systolic BP Percentile --      Girls Diastolic BP Percentile --      Boys Systolic BP Percentile --      Boys Diastolic BP Percentile --      Pulse Rate 08/21/23 1024 76     Resp 08/21/23 1024 17     Temp 08/21/23 1024 97.8 F (36.6 C)     Temp Source 08/21/23 1024 Oral     SpO2 08/21/23 1024 100 %     Weight 08/21/23 1139 144 lb 13.5 oz (65.7 kg)     Height 08/21/23 1139 5' 8 (1.727 m)     Head Circumference --      Peak Flow --      Pain Score 08/21/23 1025 10     Pain Loc --      Pain Education --      Exclude from Growth Chart --     Most recent vital signs: Vitals:   08/21/23 1024 08/21/23 1339  BP: (!) 147/88 (!) 140/80  Pulse: 76 80  Resp: 17 16  Temp: 97.8 F (36.6 C)   SpO2: 100% 100%    General Awake, no distress. HEENT NCAT.  CV:  Good peripheral perfusion.  RESP:  Normal effort.  ABD:  No distention. Other:  Diffuse ecchymosis noted to bilateral lower extremities.  Diffuse mild tenderness to shin.  Negative Homans' sign bilaterally.  Pedal pulses palpated and equal bilaterally.  Good range of motion of ankle joint.  Good range of motion of knee joint.  Patient remains ambulatory  ED  Results / Procedures / Treatments   Labs (all labs ordered are listed, but only abnormal results are displayed) Labs Reviewed - No data to display  RADIOLOGY  I personally viewed and evaluated these images as part of my medical decision making, as well as reviewing the written report by the radiologist.  ED Provider Interpretation: No acute bony abnormalities of patients lower extremities.  DG Tibia/Fibula Left Result Date: 08/21/2023 CLINICAL DATA:  injury.  Freezer fell on patient's legs. EXAM: LEFT FOOT - COMPLETE 3+ VIEW; LEFT TIBIA AND FIBULA - 2 VIEW COMPARISON:  None Available. FINDINGS: No acute fracture or dislocation. No aggressive osseous lesion. A tiny calcaneal spur noted along the Achilles tendon attachment site. Mild diffuse arthritis of imaged joints. Ankle mortise appears intact. No focal soft tissue swelling. No radiopaque foreign bodies. IMPRESSION: No acute osseous abnormality of the left foot and tibia-fibula. Electronically Signed   By: Ree Molt M.D.   On: 08/21/2023 11:15   DG Foot Complete Left Result  Date: 08/21/2023 CLINICAL DATA:  injury.  Freezer fell on patient's legs. EXAM: LEFT FOOT - COMPLETE 3+ VIEW; LEFT TIBIA AND FIBULA - 2 VIEW COMPARISON:  None Available. FINDINGS: No acute fracture or dislocation. No aggressive osseous lesion. A tiny calcaneal spur noted along the Achilles tendon attachment site. Mild diffuse arthritis of imaged joints. Ankle mortise appears intact. No focal soft tissue swelling. No radiopaque foreign bodies. IMPRESSION: No acute osseous abnormality of the left foot and tibia-fibula. Electronically Signed   By: Ree Molt M.D.   On: 08/21/2023 11:15   DG Tibia/Fibula Right Result Date: 08/21/2023 CLINICAL DATA:  injury. EXAM: RIGHT FOOT COMPLETE - 3+ VIEW; RIGHT TIBIA AND FIBULA - 2 VIEW COMPARISON:  None Available. FINDINGS: No acute fracture or dislocation. No aggressive osseous lesion. 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 The hardware is intact. No periprosthetic fracture or lucency. There is near anatomic alignment. Ankle mortise appears intact. A tiny calcaneal spur noted along the Achilles tendon attachment site. Mild diffuse degenerative changes of imaged joints. No focal soft tissue swelling. No radiopaque foreign bodies. IMPRESSION: No acute osseous abnormality of the right foot and leg. Electronically Signed   By: Ree Molt M.D.   On: 08/21/2023 11:14   DG Foot Complete Right Result Date: 08/21/2023 CLINICAL DATA:  injury. EXAM: RIGHT FOOT COMPLETE - 3+ VIEW; RIGHT TIBIA AND FIBULA - 2 VIEW COMPARISON:  None Available. FINDINGS: No acute fracture or dislocation. No aggressive osseous lesion. Patient is status post right total knee arthroplasty with patellar resurfacing. The hardware is intact. No periprosthetic fracture or lucency. There is near anatomic alignment. Ankle mortise appears intact. A tiny calcaneal spur noted along the Achilles tendon attachment site. Mild diffuse degenerative changes of imaged joints. No focal soft tissue swelling. No radiopaque foreign bodies. IMPRESSION: No acute osseous abnormality of the right foot and leg. Electronically Signed   By: Ree Molt M.D.   On: 08/21/2023 11:14    PROCEDURES:  Critical Care performed: No  Procedures   MEDICATIONS ORDERED IN ED: Medications  ibuprofen  (ADVIL ) tablet 600 mg (600 mg Oral Given 08/21/23 1306)   IMPRESSION / MDM / ASSESSMENT AND PLAN / ED COURSE  I reviewed the triage vital signs and the nursing notes.                             Clinical Course as of 08/21/23 1851  Thu Aug 21, 2023  1603 Patient is driving home will not administer narcotics for pain control.  Able to take Tylenol  as patient has an adverse reaction.  Will administer ibuprofen  and send oxycodone  to pharmacy. [MH]    Clinical Course User Index [MH] Margrette Rebbeca LABOR, PA-C   78 y.o. male presents to the emergency  department for evaluation and treatment of bilateral leg pain following a large object falling onto the legs. See HPI for further details.   Differential diagnosis includes, but is not limited to fracture, contusion, hematoma, DVT, compartment syndrome considered given MOI however less likely as physical exam findings are not consistent with this diagnosis  Patient's presentation is most consistent with acute complicated illness / injury requiring diagnostic workup.  Patient is alert and oriented.  He is hemodynamically stable.  Physical exam findings are stated above.  Imaging is reassuring.  RICE education with emphasis on elevation at home provided to patient who verbalized understanding.  He is in stable condition for  discharge home.  Strict ED precaution discussed.  Will send short course pain medicine for breakthrough pain.     FINAL CLINICAL IMPRESSION(S) / ED DIAGNOSES   Final diagnoses:  Bilateral leg pain   Rx / DC Orders   ED Discharge Orders          Ordered    oxyCODONE  (ROXICODONE ) 5 MG immediate release tablet  Every 8 hours PRN        08/21/23 1327             Note:  This document was prepared using Dragon voice recognition software and may include unintentional dictation errors.    Margrette, Billiejo Sorto A, PA-C 08/21/23 CLAIR    Jacolyn Pae, MD 08/21/23 [phone]

## 2023-08-21 NOTE — Progress Notes (Signed)
 Patient to ED per Dr. Nonnie for 200 lb freezer falling onto bilat legs and 10/10 leg pain. Patient to ED per Dr. Nonnie and handoff given to Jon, RN.

## 2023-08-21 NOTE — Discharge Instructions (Signed)
 You were evaluated in the ED for leg pain.  Your x-rays on both legs are normal and do not reveal a broken bone or fracture.  Your physical exam findings are consistent with contusions to the leg given your injury.  You will need to get plenty of rest and elevate your legs is much as possible to reduce swelling.  Apply ice to the affected area 20 minutes on 20 minutes off daily.  If symptoms worsen please return to ED for further evaluation.

## 2023-08-23 ENCOUNTER — Inpatient Hospital Stay (HOSPITAL_COMMUNITY)
Admission: EM | Admit: 2023-08-23 | Discharge: 2023-09-01 | DRG: 493 | Disposition: A | Discharge status: Skilled Nursing Facility | Attending: Internal Medicine | Admitting: Internal Medicine

## 2023-08-23 ENCOUNTER — Emergency Department (HOSPITAL_COMMUNITY)

## 2023-08-23 ENCOUNTER — Other Ambulatory Visit: Payer: Self-pay

## 2023-08-23 DIAGNOSIS — K567 Ileus, unspecified: Secondary | ICD-10-CM | POA: Diagnosis not present

## 2023-08-23 DIAGNOSIS — E8809 Other disorders of plasma-protein metabolism, not elsewhere classified: Secondary | ICD-10-CM | POA: Diagnosis present

## 2023-08-23 DIAGNOSIS — Z7951 Long term (current) use of inhaled steroids: Secondary | ICD-10-CM

## 2023-08-23 DIAGNOSIS — E1122 Type 2 diabetes mellitus with diabetic chronic kidney disease: Secondary | ICD-10-CM | POA: Diagnosis present

## 2023-08-23 DIAGNOSIS — I959 Hypotension, unspecified: Secondary | ICD-10-CM

## 2023-08-23 DIAGNOSIS — E871 Hypo-osmolality and hyponatremia: Secondary | ICD-10-CM | POA: Diagnosis present

## 2023-08-23 DIAGNOSIS — Z79899 Other long term (current) drug therapy: Secondary | ICD-10-CM

## 2023-08-23 DIAGNOSIS — N183 Chronic kidney disease, stage 3 unspecified: Secondary | ICD-10-CM | POA: Diagnosis present

## 2023-08-23 DIAGNOSIS — D631 Anemia in chronic kidney disease: Secondary | ICD-10-CM | POA: Diagnosis present

## 2023-08-23 DIAGNOSIS — J449 Chronic obstructive pulmonary disease, unspecified: Secondary | ICD-10-CM | POA: Diagnosis present

## 2023-08-23 DIAGNOSIS — E875 Hyperkalemia: Secondary | ICD-10-CM

## 2023-08-23 DIAGNOSIS — S82251A Displaced comminuted fracture of shaft of right tibia, initial encounter for closed fracture: Secondary | ICD-10-CM | POA: Diagnosis not present

## 2023-08-23 DIAGNOSIS — W230XXA Caught, crushed, jammed, or pinched between moving objects, initial encounter: Secondary | ICD-10-CM | POA: Diagnosis present

## 2023-08-23 DIAGNOSIS — E119 Type 2 diabetes mellitus without complications: Secondary | ICD-10-CM

## 2023-08-23 DIAGNOSIS — Z9049 Acquired absence of other specified parts of digestive tract: Secondary | ICD-10-CM

## 2023-08-23 DIAGNOSIS — T148XXA Other injury of unspecified body region, initial encounter: Secondary | ICD-10-CM | POA: Diagnosis not present

## 2023-08-23 DIAGNOSIS — S8781XA Crushing injury of right lower leg, initial encounter: Secondary | ICD-10-CM | POA: Diagnosis present

## 2023-08-23 DIAGNOSIS — D696 Thrombocytopenia, unspecified: Secondary | ICD-10-CM | POA: Diagnosis not present

## 2023-08-23 DIAGNOSIS — E872 Acidosis, unspecified: Secondary | ICD-10-CM | POA: Diagnosis present

## 2023-08-23 DIAGNOSIS — Z91014 Allergy to mammalian meats: Secondary | ICD-10-CM

## 2023-08-23 DIAGNOSIS — Z6822 Body mass index (BMI) 22.0-22.9, adult: Secondary | ICD-10-CM

## 2023-08-23 DIAGNOSIS — D62 Acute posthemorrhagic anemia: Secondary | ICD-10-CM | POA: Diagnosis not present

## 2023-08-23 DIAGNOSIS — N1831 Chronic kidney disease, stage 3a: Secondary | ICD-10-CM | POA: Diagnosis present

## 2023-08-23 DIAGNOSIS — J9811 Atelectasis: Secondary | ICD-10-CM | POA: Diagnosis present

## 2023-08-23 DIAGNOSIS — M9711XA Periprosthetic fracture around internal prosthetic right knee joint, initial encounter: Secondary | ICD-10-CM | POA: Diagnosis present

## 2023-08-23 DIAGNOSIS — S82451A Displaced comminuted fracture of shaft of right fibula, initial encounter for closed fracture: Secondary | ICD-10-CM | POA: Diagnosis present

## 2023-08-23 DIAGNOSIS — Z888 Allergy status to other drugs, medicaments and biological substances status: Secondary | ICD-10-CM

## 2023-08-23 DIAGNOSIS — Z87891 Personal history of nicotine dependence: Secondary | ICD-10-CM

## 2023-08-23 DIAGNOSIS — Z7982 Long term (current) use of aspirin: Secondary | ICD-10-CM

## 2023-08-23 DIAGNOSIS — N1832 Chronic kidney disease, stage 3b: Secondary | ICD-10-CM | POA: Diagnosis present

## 2023-08-23 DIAGNOSIS — Z85028 Personal history of other malignant neoplasm of stomach: Secondary | ICD-10-CM

## 2023-08-23 DIAGNOSIS — S82209A Unspecified fracture of shaft of unspecified tibia, initial encounter for closed fracture: Secondary | ICD-10-CM | POA: Diagnosis present

## 2023-08-23 DIAGNOSIS — E1169 Type 2 diabetes mellitus with other specified complication: Secondary | ICD-10-CM

## 2023-08-23 DIAGNOSIS — Z8249 Family history of ischemic heart disease and other diseases of the circulatory system: Secondary | ICD-10-CM

## 2023-08-23 DIAGNOSIS — Z7984 Long term (current) use of oral hypoglycemic drugs: Secondary | ICD-10-CM

## 2023-08-23 DIAGNOSIS — I129 Hypertensive chronic kidney disease with stage 1 through stage 4 chronic kidney disease, or unspecified chronic kidney disease: Secondary | ICD-10-CM | POA: Diagnosis present

## 2023-08-23 DIAGNOSIS — Z85038 Personal history of other malignant neoplasm of large intestine: Secondary | ICD-10-CM

## 2023-08-23 DIAGNOSIS — E440 Moderate protein-calorie malnutrition: Secondary | ICD-10-CM | POA: Diagnosis present

## 2023-08-23 LAB — CBC
HCT: 31 % — ABNORMAL LOW (ref 39.0–52.0)
Hemoglobin: 9.2 g/dL — ABNORMAL LOW (ref 13.0–17.0)
MCH: 26.7 pg (ref 26.0–34.0)
MCHC: 29.7 g/dL — ABNORMAL LOW (ref 30.0–36.0)
MCV: 89.9 fL (ref 80.0–100.0)
Platelets: 280 K/uL (ref 150–400)
RBC: 3.45 MIL/uL — ABNORMAL LOW (ref 4.22–5.81)
RDW: 15.8 % — ABNORMAL HIGH (ref 11.5–15.5)
WBC: 11.9 K/uL — ABNORMAL HIGH (ref 4.0–10.5)
nRBC: 0 % (ref 0.0–0.2)

## 2023-08-23 LAB — URINALYSIS, ROUTINE W REFLEX MICROSCOPIC
Bacteria, UA: NONE SEEN
Bilirubin Urine: NEGATIVE
Glucose, UA: 50 mg/dL — AB
Hgb urine dipstick: NEGATIVE
Ketones, ur: NEGATIVE mg/dL
Leukocytes,Ua: NEGATIVE
Nitrite: NEGATIVE
Protein, ur: 100 mg/dL — AB
Specific Gravity, Urine: 1.016 (ref 1.005–1.030)
pH: 5 (ref 5.0–8.0)

## 2023-08-23 LAB — I-STAT CG4 LACTIC ACID, ED: Lactic Acid, Venous: 1.6 mmol/L (ref 0.5–1.9)

## 2023-08-23 LAB — I-STAT CHEM 8, ED
BUN: 28 mg/dL — ABNORMAL HIGH (ref 8–23)
Calcium, Ion: 1.05 mmol/L — ABNORMAL LOW (ref 1.15–1.40)
Chloride: 109 mmol/L (ref 98–111)
Creatinine, Ser: 2 mg/dL — ABNORMAL HIGH (ref 0.61–1.24)
Glucose, Bld: 211 mg/dL — ABNORMAL HIGH (ref 70–99)
HCT: 29 % — ABNORMAL LOW (ref 39.0–52.0)
Hemoglobin: 9.9 g/dL — ABNORMAL LOW (ref 13.0–17.0)
Potassium: 5.7 mmol/L — ABNORMAL HIGH (ref 3.5–5.1)
Sodium: 137 mmol/L (ref 135–145)
TCO2: 19 mmol/L — ABNORMAL LOW (ref 22–32)

## 2023-08-23 LAB — COMPREHENSIVE METABOLIC PANEL WITH GFR
ALT: 10 U/L (ref 0–44)
AST: 20 U/L (ref 15–41)
Albumin: 3 g/dL — ABNORMAL LOW (ref 3.5–5.0)
Alkaline Phosphatase: 94 U/L (ref 38–126)
Anion gap: 12 (ref 5–15)
BUN: 29 mg/dL — ABNORMAL HIGH (ref 8–23)
CO2: 19 mmol/L — ABNORMAL LOW (ref 22–32)
Calcium: 8.2 mg/dL — ABNORMAL LOW (ref 8.9–10.3)
Chloride: 105 mmol/L (ref 98–111)
Creatinine, Ser: 1.97 mg/dL — ABNORMAL HIGH (ref 0.61–1.24)
GFR, Estimated: 34 mL/min — ABNORMAL LOW (ref >60)
Glucose, Bld: 220 mg/dL — ABNORMAL HIGH (ref 70–99)
Potassium: 5.6 mmol/L — ABNORMAL HIGH (ref 3.5–5.1)
Sodium: 136 mmol/L (ref 135–145)
Total Bilirubin: 0.5 mg/dL (ref 0.0–1.2)
Total Protein: 6 g/dL — ABNORMAL LOW (ref 6.5–8.1)

## 2023-08-23 LAB — SAMPLE TO BLOOD BANK

## 2023-08-23 LAB — ETHANOL: Alcohol, Ethyl (B): <15 mg/dL (ref <15)

## 2023-08-23 LAB — CK: Total CK: 100 U/L (ref 49–397)

## 2023-08-23 LAB — PROTIME-INR
INR: 1 (ref 0.8–1.2)
Prothrombin Time: 13.5 s (ref 11.4–15.2)

## 2023-08-23 MED ORDER — LACTATED RINGERS IV BOLUS
500.0000 mL | Freq: Once | INTRAVENOUS | Status: AC
  Administered 2023-08-23: 500 mL via INTRAVENOUS

## 2023-08-23 MED ORDER — FENTANYL CITRATE PF 50 MCG/ML IJ SOSY
50.0000 ug | PREFILLED_SYRINGE | Freq: Once | INTRAMUSCULAR | Status: AC
  Administered 2023-08-23: 50 ug via INTRAVENOUS

## 2023-08-23 MED ORDER — LACTATED RINGERS IV BOLUS
1000.0000 mL | Freq: Once | INTRAVENOUS | Status: AC
  Administered 2023-08-23: 1000 mL via INTRAVENOUS

## 2023-08-23 MED ORDER — LACTATED RINGERS IV SOLN: INTRAVENOUS | Status: DC

## 2023-08-23 MED ORDER — KCL IN DEXTROSE-NACL 20-5-0.45 MEQ/L-%-% IV SOLN
Freq: Once | INTRAVENOUS | Status: AC
  Filled 2023-08-23: qty 1000

## 2023-08-23 MED ORDER — FENTANYL CITRATE PF 50 MCG/ML IJ SOSY
50.0000 ug | PREFILLED_SYRINGE | Freq: Once | INTRAMUSCULAR | Status: AC
  Administered 2023-08-23: 50 ug via INTRAVENOUS
  Filled 2023-08-23: qty 1

## 2023-08-23 MED ORDER — FENTANYL CITRATE PF 50 MCG/ML IJ SOSY
50.0000 ug | PREFILLED_SYRINGE | Freq: Once | INTRAMUSCULAR | Status: DC
  Filled 2023-08-23: qty 1

## 2023-08-23 MED ORDER — FENTANYL CITRATE PF 50 MCG/ML IJ SOSY
PREFILLED_SYRINGE | INTRAMUSCULAR | Status: AC
  Filled 2023-08-23: qty 1

## 2023-08-23 MED ORDER — FENTANYL CITRATE PF 50 MCG/ML IJ SOSY
PREFILLED_SYRINGE | INTRAMUSCULAR | Status: AC
Start: 2023-08-23 — End: 2023-08-23
  Filled 2023-08-23: qty 1

## 2023-08-23 NOTE — ED Notes (Signed)
 Trauma Response Nurse Documentation   Joe Coleman is a 78 y.o. male arriving to [facility] ED via {Trauma ED/EMS:26864}  On {meds; anticoagulants:31417}. Trauma was activated as a {Trauma Level:26868} by *** based on the following trauma criteria {Trauma criteria:26865}.  Patient cleared for CT by Dr. FERNAND Pt transported to {TRN Radiology:26861::CT} with trauma response nurse present to monitor. RN remained with the patient throughout their absence from the department for clinical observation.   GCS ***.  Trauma MD Arrival Time: ***.  History   Past Medical History:  Diagnosis Date   Cancer (HCC)    stomach and colon   Diabetes mellitus without complication (HCC)    Hypertension      Past Surgical History:  Procedure Laterality Date   APPENDECTOMY     COLON SURGERY         Initial Focused Assessment (If applicable, or please see trauma documentation):   CT's Completed:   CT Chest w/ contrast and CT abdomen/pelvis w/ contrast   Interventions:   Plan for disposition:  {Trauma Dispo:26867}   Consults completed:  Orthopaedic Surgeon Marchwiany called by Dr. Paola directly at 2040, t0 bedside at 2240.  Event Summary:  MTP Summary (If applicable):   Bedside handoff with ED RN Joe Coleman.    Joe Coleman  Trauma Response RN  Please call TRN at [phone] for further assistance.
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
## 2023-08-23 NOTE — ED Notes (Signed)
 Ortho at bedside.

## 2023-08-23 NOTE — Progress Notes (Signed)
 Orthopedic Tech Progress Note Patient Details:  Joe Coleman [birth year] [id number]  Patient ID: Joe Coleman, male   DOB: 1945-05-26, 78 y.o.   MRN: [id number] I attended trauma page. Chandra Dorn PARAS 08/23/2023, 9:14 PM
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
## 2023-08-23 NOTE — ED Notes (Signed)
 Patient transported to CT
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
## 2023-08-24 ENCOUNTER — Encounter (HOSPITAL_COMMUNITY): Payer: Self-pay | Admitting: Internal Medicine

## 2023-08-24 DIAGNOSIS — Z79899 Other long term (current) drug therapy: Secondary | ICD-10-CM | POA: Diagnosis not present

## 2023-08-24 DIAGNOSIS — S82201D Unspecified fracture of shaft of right tibia, subsequent encounter for closed fracture with routine healing: Secondary | ICD-10-CM | POA: Diagnosis not present

## 2023-08-24 DIAGNOSIS — J441 Chronic obstructive pulmonary disease with (acute) exacerbation: Secondary | ICD-10-CM | POA: Diagnosis not present

## 2023-08-24 DIAGNOSIS — E440 Moderate protein-calorie malnutrition: Secondary | ICD-10-CM | POA: Diagnosis present

## 2023-08-24 DIAGNOSIS — E875 Hyperkalemia: Secondary | ICD-10-CM | POA: Diagnosis present

## 2023-08-24 DIAGNOSIS — N179 Acute kidney failure, unspecified: Secondary | ICD-10-CM | POA: Diagnosis not present

## 2023-08-24 DIAGNOSIS — E8809 Other disorders of plasma-protein metabolism, not elsewhere classified: Secondary | ICD-10-CM | POA: Diagnosis present

## 2023-08-24 DIAGNOSIS — Z87891 Personal history of nicotine dependence: Secondary | ICD-10-CM | POA: Diagnosis not present

## 2023-08-24 DIAGNOSIS — J449 Chronic obstructive pulmonary disease, unspecified: Secondary | ICD-10-CM | POA: Diagnosis present

## 2023-08-24 DIAGNOSIS — S8781XA Crushing injury of right lower leg, initial encounter: Secondary | ICD-10-CM | POA: Diagnosis present

## 2023-08-24 DIAGNOSIS — W230XXA Caught, crushed, jammed, or pinched between moving objects, initial encounter: Secondary | ICD-10-CM | POA: Diagnosis present

## 2023-08-24 DIAGNOSIS — D62 Acute posthemorrhagic anemia: Secondary | ICD-10-CM | POA: Diagnosis not present

## 2023-08-24 DIAGNOSIS — I959 Hypotension, unspecified: Secondary | ICD-10-CM | POA: Diagnosis present

## 2023-08-24 DIAGNOSIS — J9811 Atelectasis: Secondary | ICD-10-CM | POA: Diagnosis present

## 2023-08-24 DIAGNOSIS — N1832 Chronic kidney disease, stage 3b: Secondary | ICD-10-CM | POA: Diagnosis not present

## 2023-08-24 DIAGNOSIS — S82401A Unspecified fracture of shaft of right fibula, initial encounter for closed fracture: Secondary | ICD-10-CM | POA: Diagnosis not present

## 2023-08-24 DIAGNOSIS — S82401D Unspecified fracture of shaft of right fibula, subsequent encounter for closed fracture with routine healing: Secondary | ICD-10-CM | POA: Diagnosis not present

## 2023-08-24 DIAGNOSIS — S82201A Unspecified fracture of shaft of right tibia, initial encounter for closed fracture: Secondary | ICD-10-CM

## 2023-08-24 DIAGNOSIS — I129 Hypertensive chronic kidney disease with stage 1 through stage 4 chronic kidney disease, or unspecified chronic kidney disease: Secondary | ICD-10-CM | POA: Diagnosis present

## 2023-08-24 DIAGNOSIS — N1831 Chronic kidney disease, stage 3a: Secondary | ICD-10-CM | POA: Diagnosis present

## 2023-08-24 DIAGNOSIS — Z6822 Body mass index (BMI) 22.0-22.9, adult: Secondary | ICD-10-CM | POA: Diagnosis not present

## 2023-08-24 DIAGNOSIS — E872 Acidosis, unspecified: Secondary | ICD-10-CM | POA: Diagnosis present

## 2023-08-24 DIAGNOSIS — Z8249 Family history of ischemic heart disease and other diseases of the circulatory system: Secondary | ICD-10-CM | POA: Diagnosis not present

## 2023-08-24 DIAGNOSIS — D631 Anemia in chronic kidney disease: Secondary | ICD-10-CM | POA: Diagnosis present

## 2023-08-24 DIAGNOSIS — T148XXA Other injury of unspecified body region, initial encounter: Secondary | ICD-10-CM | POA: Diagnosis present

## 2023-08-24 DIAGNOSIS — S82251A Displaced comminuted fracture of shaft of right tibia, initial encounter for closed fracture: Secondary | ICD-10-CM | POA: Diagnosis present

## 2023-08-24 DIAGNOSIS — E1122 Type 2 diabetes mellitus with diabetic chronic kidney disease: Secondary | ICD-10-CM | POA: Diagnosis present

## 2023-08-24 DIAGNOSIS — Z7984 Long term (current) use of oral hypoglycemic drugs: Secondary | ICD-10-CM | POA: Diagnosis not present

## 2023-08-24 DIAGNOSIS — N183 Chronic kidney disease, stage 3 unspecified: Secondary | ICD-10-CM | POA: Diagnosis not present

## 2023-08-24 DIAGNOSIS — M9711XA Periprosthetic fracture around internal prosthetic right knee joint, initial encounter: Secondary | ICD-10-CM | POA: Diagnosis present

## 2023-08-24 DIAGNOSIS — S82451A Displaced comminuted fracture of shaft of right fibula, initial encounter for closed fracture: Secondary | ICD-10-CM | POA: Diagnosis present

## 2023-08-24 DIAGNOSIS — K567 Ileus, unspecified: Secondary | ICD-10-CM | POA: Diagnosis not present

## 2023-08-24 DIAGNOSIS — D696 Thrombocytopenia, unspecified: Secondary | ICD-10-CM | POA: Diagnosis not present

## 2023-08-24 DIAGNOSIS — E871 Hypo-osmolality and hyponatremia: Secondary | ICD-10-CM | POA: Diagnosis present

## 2023-08-24 DIAGNOSIS — S82209A Unspecified fracture of shaft of unspecified tibia, initial encounter for closed fracture: Secondary | ICD-10-CM | POA: Diagnosis present

## 2023-08-24 LAB — BASIC METABOLIC PANEL WITH GFR
Anion gap: 7 (ref 5–15)
BUN: 30 mg/dL — ABNORMAL HIGH (ref 8–23)
CO2: 21 mmol/L — ABNORMAL LOW (ref 22–32)
Calcium: 7.8 mg/dL — ABNORMAL LOW (ref 8.9–10.3)
Chloride: 108 mmol/L (ref 98–111)
Creatinine, Ser: 2.03 mg/dL — ABNORMAL HIGH (ref 0.61–1.24)
GFR, Estimated: 33 mL/min — ABNORMAL LOW (ref >60)
Glucose, Bld: 180 mg/dL — ABNORMAL HIGH (ref 70–99)
Potassium: 5.4 mmol/L — ABNORMAL HIGH (ref 3.5–5.1)
Sodium: 136 mmol/L (ref 135–145)

## 2023-08-24 LAB — GLUCOSE, CAPILLARY
Glucose-Capillary: 174 mg/dL — ABNORMAL HIGH (ref 70–99)
Glucose-Capillary: 180 mg/dL — ABNORMAL HIGH (ref 70–99)
Glucose-Capillary: 156 mg/dL — ABNORMAL HIGH (ref 70–99)

## 2023-08-24 LAB — CBC
HCT: 18.1 % — ABNORMAL LOW (ref 39.0–52.0)
HCT: 17.5 % — ABNORMAL LOW (ref 39.0–52.0)
Hemoglobin: 5.4 g/dL — CL (ref 13.0–17.0)
Hemoglobin: 5.3 g/dL — CL (ref 13.0–17.0)
MCH: 27.4 pg (ref 26.0–34.0)
MCH: 27.5 pg (ref 26.0–34.0)
MCHC: 29.8 g/dL — ABNORMAL LOW (ref 30.0–36.0)
MCHC: 30.3 g/dL (ref 30.0–36.0)
MCV: 91.9 fL (ref 80.0–100.0)
MCV: 90.7 fL (ref 80.0–100.0)
Platelets: 168 K/uL (ref 150–400)
Platelets: 189 K/uL (ref 150–400)
RBC: 1.97 MIL/uL — ABNORMAL LOW (ref 4.22–5.81)
RBC: 1.93 MIL/uL — ABNORMAL LOW (ref 4.22–5.81)
RDW: 16 % — ABNORMAL HIGH (ref 11.5–15.5)
RDW: 15.9 % — ABNORMAL HIGH (ref 11.5–15.5)
WBC: 10.5 K/uL (ref 4.0–10.5)
WBC: 10.9 K/uL — ABNORMAL HIGH (ref 4.0–10.5)
nRBC: 0 % (ref 0.0–0.2)
nRBC: 0 % (ref 0.0–0.2)

## 2023-08-24 LAB — CBG MONITORING, ED: Glucose-Capillary: 244 mg/dL — ABNORMAL HIGH (ref 70–99)

## 2023-08-24 LAB — PREPARE RBC (CROSSMATCH)

## 2023-08-24 LAB — ABO/RH: ABO/RH(D): O POS

## 2023-08-24 MED ORDER — OXYCODONE HCL 5 MG PO TABS
5.0000 mg | ORAL_TABLET | ORAL | Status: DC | PRN
  Administered 2023-08-24: 5 mg via ORAL
  Filled 2023-08-24: qty 1

## 2023-08-24 MED ORDER — ALBUMIN HUMAN 25 % IV SOLN
25.0000 g | INTRAVENOUS | Status: AC
  Administered 2023-08-24: 12.5 g via INTRAVENOUS
  Filled 2023-08-24: qty 100

## 2023-08-24 MED ORDER — OXYCODONE HCL 5 MG PO TABS
5.0000 mg | ORAL_TABLET | Freq: Four times a day (QID) | ORAL | Status: DC | PRN
  Administered 2023-08-24: 5 mg via ORAL
  Filled 2023-08-24: qty 1

## 2023-08-24 MED ORDER — IPRATROPIUM-ALBUTEROL 0.5-2.5 (3) MG/3ML IN SOLN
3.0000 mL | RESPIRATORY_TRACT | Status: DC | PRN
  Administered 2023-08-24: 3 mL via RESPIRATORY_TRACT
  Filled 2023-08-24: qty 3

## 2023-08-24 MED ORDER — SODIUM CHLORIDE 0.9 % IV SOLN: INTRAVENOUS | Status: DC

## 2023-08-24 MED ORDER — INSULIN ASPART 100 UNIT/ML IJ SOLN
0.0000 [IU] | Freq: Three times a day (TID) | INTRAMUSCULAR | Status: DC
  Administered 2023-08-24: 2 [IU] via SUBCUTANEOUS
  Administered 2023-08-24: 1 [IU] via SUBCUTANEOUS
  Administered 2023-08-24: 2 [IU] via SUBCUTANEOUS
  Administered 2023-08-25: 1 [IU] via SUBCUTANEOUS
  Administered 2023-08-25: 2 [IU] via SUBCUTANEOUS
  Administered 2023-08-26: 3 [IU] via SUBCUTANEOUS
  Administered 2023-08-26 – 2023-08-27 (×3): 2 [IU] via SUBCUTANEOUS
  Administered 2023-08-27 – 2023-08-28 (×3): 1 [IU] via SUBCUTANEOUS
  Administered 2023-08-28: 2 [IU] via SUBCUTANEOUS
  Administered 2023-08-28: 1 [IU] via SUBCUTANEOUS
  Administered 2023-08-29: 2 [IU] via SUBCUTANEOUS
  Administered 2023-08-29 – 2023-08-30 (×2): 1 [IU] via SUBCUTANEOUS
  Administered 2023-08-30 (×2): 2 [IU] via SUBCUTANEOUS
  Administered 2023-08-31: 1 [IU] via SUBCUTANEOUS
  Administered 2023-08-31: 2 [IU] via SUBCUTANEOUS
  Administered 2023-08-31: 1 [IU] via SUBCUTANEOUS
  Administered 2023-09-01: 2 [IU] via SUBCUTANEOUS

## 2023-08-24 MED ORDER — ONDANSETRON HCL 4 MG/2ML IJ SOLN
4.0000 mg | Freq: Four times a day (QID) | INTRAMUSCULAR | Status: DC | PRN
  Administered 2023-08-25: 4 mg via INTRAVENOUS

## 2023-08-24 MED ORDER — OXYCODONE HCL 5 MG PO TABS
10.0000 mg | ORAL_TABLET | ORAL | Status: DC | PRN
  Administered 2023-08-24 – 2023-08-25 (×5): 10 mg via ORAL
  Filled 2023-08-24 (×5): qty 2

## 2023-08-24 MED ORDER — METHOCARBAMOL 1000 MG/10ML IJ SOLN
500.0000 mg | Freq: Four times a day (QID) | INTRAMUSCULAR | Status: DC | PRN
Start: 2023-08-24 — End: 2023-08-26
  Administered 2023-08-24 (×3): 500 mg via INTRAVENOUS
  Filled 2023-08-24 (×3): qty 10

## 2023-08-24 MED ORDER — MORPHINE SULFATE (PF) 2 MG/ML IV SOLN
2.0000 mg | INTRAVENOUS | Status: DC | PRN
  Administered 2023-08-24 – 2023-08-25 (×4): 2 mg via INTRAVENOUS
  Filled 2023-08-24 (×4): qty 1

## 2023-08-24 MED ORDER — SODIUM CHLORIDE 0.9% IV SOLUTION: Freq: Once | INTRAVENOUS | Status: AC

## 2023-08-24 MED ORDER — IPRATROPIUM-ALBUTEROL 0.5-2.5 (3) MG/3ML IN SOLN: 3.0000 mL | Freq: Four times a day (QID) | RESPIRATORY_TRACT | Status: DC | PRN

## 2023-08-24 MED ORDER — METOPROLOL TARTRATE 5 MG/5ML IV SOLN
5.0000 mg | INTRAVENOUS | Status: DC | PRN
  Administered 2023-08-25: 5 mg via INTRAVENOUS
  Filled 2023-08-24: qty 5

## 2023-08-24 MED ORDER — SODIUM BICARBONATE 8.4 % IV SOLN
25.0000 meq | Freq: Once | INTRAVENOUS | Status: AC
  Administered 2023-08-24: 25 meq via INTRAVENOUS
  Filled 2023-08-24: qty 50

## 2023-08-24 MED ORDER — ALBUTEROL SULFATE (2.5 MG/3ML) 0.083% IN NEBU: 2.5000 mg | INHALATION_SOLUTION | Freq: Four times a day (QID) | RESPIRATORY_TRACT | Status: DC | PRN

## 2023-08-24 MED ORDER — FENTANYL CITRATE PF 50 MCG/ML IJ SOSY
12.5000 ug | PREFILLED_SYRINGE | INTRAMUSCULAR | Status: DC | PRN
  Administered 2023-08-24: 50 ug via INTRAVENOUS
  Filled 2023-08-24: qty 1

## 2023-08-24 MED ORDER — ACETAMINOPHEN 325 MG PO TABS
650.0000 mg | ORAL_TABLET | Freq: Four times a day (QID) | ORAL | Status: DC | PRN
Start: 2023-08-24 — End: 2023-08-26

## 2023-08-24 MED ORDER — INSULIN ASPART 100 UNIT/ML IJ SOLN
0.0000 [IU] | Freq: Every day | INTRAMUSCULAR | Status: DC
  Administered 2023-08-26: 3 [IU] via SUBCUTANEOUS
  Administered 2023-08-27: 2 [IU] via SUBCUTANEOUS

## 2023-08-24 MED ORDER — INSULIN ASPART 100 UNIT/ML IJ SOLN
5.0000 [IU] | Freq: Once | INTRAMUSCULAR | Status: AC
  Administered 2023-08-24: 5 [IU] via SUBCUTANEOUS

## 2023-08-24 MED ORDER — NALOXONE HCL 0.4 MG/ML IJ SOLN: 0.4000 mg | INTRAMUSCULAR | Status: DC | PRN

## 2023-08-24 MED ORDER — GABAPENTIN 300 MG PO CAPS
300.0000 mg | ORAL_CAPSULE | Freq: Every day | ORAL | Status: DC
  Administered 2023-08-24 – 2023-08-31 (×8): 300 mg via ORAL
  Filled 2023-08-24 (×8): qty 1

## 2023-08-24 MED ORDER — ACETAMINOPHEN 650 MG RE SUPP: 650.0000 mg | Freq: Four times a day (QID) | RECTAL | Status: DC | PRN

## 2023-08-24 MED ORDER — LACTATED RINGERS IV BOLUS
1000.0000 mL | Freq: Once | INTRAVENOUS | Status: AC
  Administered 2023-08-24: 1000 mL via INTRAVENOUS

## 2023-08-24 MED ORDER — BUDESON-GLYCOPYRROL-FORMOTEROL 160-9-4.8 MCG/ACT IN AERO
2.0000 | INHALATION_SPRAY | Freq: Two times a day (BID) | RESPIRATORY_TRACT | Status: DC
  Administered 2023-08-24 – 2023-09-01 (×16): 2 via RESPIRATORY_TRACT
  Filled 2023-08-24: qty 5.9

## 2023-08-24 MED ORDER — HYDRALAZINE HCL 20 MG/ML IJ SOLN: 10.0000 mg | INTRAMUSCULAR | Status: DC | PRN

## 2023-08-24 MED ORDER — METHOCARBAMOL 500 MG PO TABS
500.0000 mg | ORAL_TABLET | Freq: Once | ORAL | Status: AC
  Administered 2023-08-24: 500 mg via ORAL
  Filled 2023-08-24: qty 1

## 2023-08-24 NOTE — Progress Notes (Signed)
   ORTHOPAEDIC PROGRESS NOTE  Right tibia fracture  SUBJECTIVE: Reports severe pain in the right lower leg. He has not been elevating. Plan for surgical intervention tomorrow. Denies numbness and tingling.  OBJECTIVE: PE: General: sitting up in hospital bed, NAD RLE: splint CDI, +EHL though remainder of motor difficult to test due to splint, sensation intact distally with warm well perfused foot, no pain w passive stretch   Vitals:   08/24/23 0805 08/24/23 1117  BP: 125/61 122/66  Pulse: 98 (!) 103  Resp: 18 20  Temp: 98.3 F (36.8 C) 98.5 F (36.9 C)  SpO2: 100% 96%     ASSESSMENT: Joe Coleman is a 78 y.o. male with right comminuted periprosthetic tibial shaft fracture  PLAN: Patient has a comminuted close right periprosthetic tibial shaft fracture has moderate swelling but compartments are soft and no pain on passive stretch.  Continue immobilization in a long-leg splint.  Patient will benefit from surgical fixation of this fracture.  Given complex nature of the fracture, ortho traumatologist to perform surgery tomorrow. NWB RLE. NPO midnight.   Contact information:   After hours and holidays please check [email or web address] for group call information for [facility]   Aleck Stalling, PA-C 08/24/23
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
## 2023-08-24 NOTE — Progress Notes (Signed)
 Trauma Event Note   Notified floor coverage of continued hypotension, currently 77/63. See new orders, plan for more fluid bolus and albumin .   Schon Zeiders O Jaylenne Hamelin  Trauma Response RN  Please call TRN at [phone] for further assistance.

## 2023-08-24 NOTE — Progress Notes (Signed)
 Orthopedic Tech Progress Note Patient Details:  Joe Coleman September 09, 1945 [id number]  Ortho Devices Type of Ortho Device: Post (long leg) splint, Stirrup splint Ortho Device/Splint Location: rle Ortho Device/Splint Interventions: Ordered, Application, Adjustment  I applied a posterior long leg splint with stirrups with assist from 2 rn to hold leg for me. Post Interventions Patient Tolerated: Well Instructions Provided: Care of device, Adjustment of device  Chandra Dorn PARAS 08/24/2023, 12:00 AM

## 2023-08-24 NOTE — Plan of Care (Signed)

## 2023-08-24 NOTE — Progress Notes (Signed)
 Transition of Care [facility]) - CAGE-AID Screening   Patient Details  Name: Joe Coleman MRN: [id number] Date of Birth: 1945/03/18  Transition of Care [facility]) CM/SW Contact:    Sallyanne MALVA Mettle, RN Phone Number: 08/24/2023, 3:23 AM   Clinical Narrative: Denies alcohol and drug use, no resources indicated.   CAGE-AID Screening:    Have You Ever Felt You Ought to Cut Down on Your Drinking or Drug Use?: No Have People Annoyed You By Critizing Your Drinking Or Drug Use?: No Have You Felt Bad Or Guilty About Your Drinking Or Drug Use?: No Have You Ever Had a Drink or Used Drugs First Thing In The Morning to Steady Your Nerves or to Get Rid of a Hangover?: No CAGE-AID Score: 0  Substance Abuse Education Offered: No

## 2023-08-24 NOTE — Plan of Care (Signed)
 Patient blood pressure is persistently soft.  Giving another liter of LR bolus, albumin  25 g and increasing fluid rate to 150 cc/h.  Laquasha Groome, MD [facility] 08/24/2023, 3:40 AM
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
## 2023-08-25 ENCOUNTER — Encounter (HOSPITAL_COMMUNITY)
Admission: EM | Disposition: A | Discharge status: Skilled Nursing Facility | Payer: Self-pay | Source: Home / Self Care | Attending: Internal Medicine

## 2023-08-25 ENCOUNTER — Encounter (HOSPITAL_COMMUNITY): Payer: Self-pay | Admitting: Internal Medicine

## 2023-08-25 ENCOUNTER — Inpatient Hospital Stay (HOSPITAL_COMMUNITY): Admitting: Certified Registered Nurse Anesthetist

## 2023-08-25 ENCOUNTER — Inpatient Hospital Stay (HOSPITAL_COMMUNITY)

## 2023-08-25 ENCOUNTER — Other Ambulatory Visit: Payer: Self-pay

## 2023-08-25 DIAGNOSIS — S82201D Unspecified fracture of shaft of right tibia, subsequent encounter for closed fracture with routine healing: Secondary | ICD-10-CM | POA: Diagnosis not present

## 2023-08-25 DIAGNOSIS — N183 Chronic kidney disease, stage 3 unspecified: Secondary | ICD-10-CM

## 2023-08-25 DIAGNOSIS — S82201A Unspecified fracture of shaft of right tibia, initial encounter for closed fracture: Secondary | ICD-10-CM

## 2023-08-25 DIAGNOSIS — S82401D Unspecified fracture of shaft of right fibula, subsequent encounter for closed fracture with routine healing: Secondary | ICD-10-CM

## 2023-08-25 DIAGNOSIS — I129 Hypertensive chronic kidney disease with stage 1 through stage 4 chronic kidney disease, or unspecified chronic kidney disease: Secondary | ICD-10-CM | POA: Diagnosis not present

## 2023-08-25 DIAGNOSIS — J441 Chronic obstructive pulmonary disease with (acute) exacerbation: Secondary | ICD-10-CM | POA: Diagnosis not present

## 2023-08-25 HISTORY — PX: ORIF TIBIA FRACTURE: SHX5416

## 2023-08-25 LAB — BASIC METABOLIC PANEL WITH GFR
Anion gap: 8 (ref 5–15)
BUN: 24 mg/dL — ABNORMAL HIGH (ref 8–23)
CO2: 22 mmol/L (ref 22–32)
Calcium: 8.3 mg/dL — ABNORMAL LOW (ref 8.9–10.3)
Chloride: 107 mmol/L (ref 98–111)
Creatinine, Ser: 1.82 mg/dL — ABNORMAL HIGH (ref 0.61–1.24)
GFR, Estimated: 37 mL/min — ABNORMAL LOW (ref >60)
Glucose, Bld: 174 mg/dL — ABNORMAL HIGH (ref 70–99)
Potassium: 5.3 mmol/L — ABNORMAL HIGH (ref 3.5–5.1)
Sodium: 137 mmol/L (ref 135–145)

## 2023-08-25 LAB — CBC
HCT: 27.1 % — ABNORMAL LOW (ref 39.0–52.0)
Hemoglobin: 8.7 g/dL — ABNORMAL LOW (ref 13.0–17.0)
MCH: 27.9 pg (ref 26.0–34.0)
MCHC: 32.1 g/dL (ref 30.0–36.0)
MCV: 86.9 fL (ref 80.0–100.0)
Platelets: 157 K/uL (ref 150–400)
RBC: 3.12 MIL/uL — ABNORMAL LOW (ref 4.22–5.81)
RDW: 16.2 % — ABNORMAL HIGH (ref 11.5–15.5)
WBC: 11.3 K/uL — ABNORMAL HIGH (ref 4.0–10.5)
nRBC: 0 % (ref 0.0–0.2)

## 2023-08-25 LAB — GLUCOSE, CAPILLARY
Glucose-Capillary: 130 mg/dL — ABNORMAL HIGH (ref 70–99)
Glucose-Capillary: 136 mg/dL — ABNORMAL HIGH (ref 70–99)
Glucose-Capillary: 134 mg/dL — ABNORMAL HIGH (ref 70–99)
Glucose-Capillary: 146 mg/dL — ABNORMAL HIGH (ref 70–99)
Glucose-Capillary: 164 mg/dL — ABNORMAL HIGH (ref 70–99)
Glucose-Capillary: 166 mg/dL — ABNORMAL HIGH (ref 70–99)

## 2023-08-25 LAB — SURGICAL PCR SCREEN
MRSA, PCR: NEGATIVE
Staphylococcus aureus: NEGATIVE

## 2023-08-25 LAB — PHOSPHORUS: Phosphorus: 3.6 mg/dL (ref 2.5–4.6)

## 2023-08-25 LAB — MAGNESIUM: Magnesium: 1.9 mg/dL (ref 1.7–2.4)

## 2023-08-25 SURGERY — OPEN REDUCTION INTERNAL FIXATION (ORIF) TIBIA FRACTURE
Anesthesia: General | Site: Leg Lower | Laterality: Right

## 2023-08-25 MED ORDER — INSULIN ASPART 100 UNIT/ML IJ SOLN: 0.0000 [IU] | INTRAMUSCULAR | Status: DC | PRN

## 2023-08-25 MED ORDER — FENTANYL CITRATE (PF) 250 MCG/5ML IJ SOLN
INTRAMUSCULAR | Status: AC
  Filled 2023-08-25: qty 5

## 2023-08-25 MED ORDER — ACETAMINOPHEN 10 MG/ML IV SOLN
INTRAVENOUS | Status: AC
  Filled 2023-08-25: qty 100

## 2023-08-25 MED ORDER — PHENYLEPHRINE 80 MCG/ML (10ML) SYRINGE FOR IV PUSH (FOR BLOOD PRESSURE SUPPORT)
PREFILLED_SYRINGE | INTRAVENOUS | Status: DC | PRN
  Administered 2023-08-25: 160 ug via INTRAVENOUS

## 2023-08-25 MED ORDER — LIDOCAINE 2% (20 MG/ML) 5 ML SYRINGE
INTRAMUSCULAR | Status: AC
  Filled 2023-08-25: qty 5

## 2023-08-25 MED ORDER — CHLORHEXIDINE GLUCONATE 0.12 % MT SOLN: 15.0000 mL | Freq: Once | OROMUCOSAL | Status: AC

## 2023-08-25 MED ORDER — 0.9 % SODIUM CHLORIDE (POUR BTL) OPTIME
TOPICAL | Status: DC | PRN
  Administered 2023-08-25: 1000 mL

## 2023-08-25 MED ORDER — PROPOFOL 10 MG/ML IV BOLUS
INTRAVENOUS | Status: AC
  Filled 2023-08-25: qty 20

## 2023-08-25 MED ORDER — CEFAZOLIN SODIUM-DEXTROSE 2-3 GM-%(50ML) IV SOLR
INTRAVENOUS | Status: DC | PRN
  Administered 2023-08-25: 2 g via INTRAVENOUS

## 2023-08-25 MED ORDER — STERILE WATER FOR INJECTION IJ SOLN
INTRAMUSCULAR | Status: DC | PRN
Start: 2023-08-25 — End: 2023-08-25
  Administered 2023-08-25: 1000 mL

## 2023-08-25 MED ORDER — SUGAMMADEX SODIUM 200 MG/2ML IV SOLN
INTRAVENOUS | Status: DC | PRN
  Administered 2023-08-25: 136 mg via INTRAVENOUS

## 2023-08-25 MED ORDER — OXYCODONE HCL 5 MG PO TABS: 5.0000 mg | ORAL_TABLET | Freq: Once | ORAL | Status: DC | PRN

## 2023-08-25 MED ORDER — CEFAZOLIN SODIUM-DEXTROSE 2-4 GM/100ML-% IV SOLN
INTRAVENOUS | Status: AC
  Filled 2023-08-25: qty 100

## 2023-08-25 MED ORDER — ORAL CARE MOUTH RINSE
15.0000 mL | Freq: Once | OROMUCOSAL | Status: AC
Start: 2023-08-25 — End: 2023-08-25

## 2023-08-25 MED ORDER — PHENYLEPHRINE HCL-NACL 20-0.9 MG/250ML-% IV SOLN
INTRAVENOUS | Status: DC | PRN
  Administered 2023-08-25: 80 ug via INTRAVENOUS
  Administered 2023-08-25: 40 ug via INTRAVENOUS
  Administered 2023-08-25: 160 ug via INTRAVENOUS

## 2023-08-25 MED ORDER — VANCOMYCIN HCL 1000 MG IV SOLR
INTRAVENOUS | Status: AC
  Filled 2023-08-25: qty 20

## 2023-08-25 MED ORDER — SODIUM CHLORIDE 0.9 % IV SOLN: INTRAVENOUS | Status: AC

## 2023-08-25 MED ORDER — ROCURONIUM BROMIDE 10 MG/ML (PF) SYRINGE
PREFILLED_SYRINGE | INTRAVENOUS | Status: DC | PRN
  Administered 2023-08-25: 60 mg via INTRAVENOUS

## 2023-08-25 MED ORDER — VANCOMYCIN HCL 1000 MG IV SOLR
INTRAVENOUS | Status: DC | PRN
Start: 2023-08-25 — End: 2023-08-25
  Administered 2023-08-25: 1000 mg via TOPICAL

## 2023-08-25 MED ORDER — HYDROMORPHONE HCL 1 MG/ML IJ SOLN
INTRAMUSCULAR | Status: AC
  Filled 2023-08-25: qty 1

## 2023-08-25 MED ORDER — DROPERIDOL 2.5 MG/ML IJ SOLN: 0.6250 mg | Freq: Once | INTRAMUSCULAR | Status: DC | PRN

## 2023-08-25 MED ORDER — SODIUM ZIRCONIUM CYCLOSILICATE 10 G PO PACK
10.0000 g | PACK | Freq: Once | ORAL | Status: AC
  Administered 2023-08-25: 10 g via ORAL
  Filled 2023-08-25: qty 1

## 2023-08-25 MED ORDER — CHLORHEXIDINE GLUCONATE 0.12 % MT SOLN
OROMUCOSAL | Status: AC
  Administered 2023-08-25: 15 mL via OROMUCOSAL
  Filled 2023-08-25: qty 15

## 2023-08-25 MED ORDER — SUGAMMADEX SODIUM 200 MG/2ML IV SOLN
INTRAVENOUS | Status: AC
Start: 2023-08-25 — End: 2023-08-25
  Filled 2023-08-25: qty 2

## 2023-08-25 MED ORDER — ROCURONIUM BROMIDE 10 MG/ML (PF) SYRINGE
PREFILLED_SYRINGE | INTRAVENOUS | Status: AC
  Filled 2023-08-25: qty 10

## 2023-08-25 MED ORDER — FENTANYL CITRATE (PF) 250 MCG/5ML IJ SOLN
INTRAMUSCULAR | Status: DC | PRN
  Administered 2023-08-25: 25 ug via INTRAVENOUS
  Administered 2023-08-25: 75 ug via INTRAVENOUS

## 2023-08-25 MED ORDER — PHENYLEPHRINE 80 MCG/ML (10ML) SYRINGE FOR IV PUSH (FOR BLOOD PRESSURE SUPPORT)
PREFILLED_SYRINGE | INTRAVENOUS | Status: AC
  Filled 2023-08-25: qty 10

## 2023-08-25 MED ORDER — PROPOFOL 10 MG/ML IV BOLUS
INTRAVENOUS | Status: DC | PRN
  Administered 2023-08-25: 120 mg via INTRAVENOUS

## 2023-08-25 MED ORDER — SCOPOLAMINE 1 MG/3DAYS TD PT72
MEDICATED_PATCH | TRANSDERMAL | Status: AC
  Filled 2023-08-25: qty 1

## 2023-08-25 MED ORDER — OXYCODONE HCL 5 MG/5ML PO SOLN: 5.0000 mg | Freq: Once | ORAL | Status: DC | PRN

## 2023-08-25 MED ORDER — LIDOCAINE 2% (20 MG/ML) 5 ML SYRINGE
INTRAMUSCULAR | Status: DC | PRN
  Administered 2023-08-25: 60 mg via INTRAVENOUS

## 2023-08-25 MED ORDER — PROPOFOL 1000 MG/100ML IV EMUL
INTRAVENOUS | Status: AC
  Filled 2023-08-25: qty 200

## 2023-08-25 MED ORDER — LACTATED RINGERS IV SOLN: INTRAVENOUS | Status: DC

## 2023-08-25 MED ORDER — ONDANSETRON HCL 4 MG/2ML IJ SOLN
INTRAMUSCULAR | Status: AC
  Filled 2023-08-25: qty 2

## 2023-08-25 MED ORDER — HYDROMORPHONE HCL 1 MG/ML IJ SOLN
0.2500 mg | INTRAMUSCULAR | Status: DC | PRN
  Administered 2023-08-25 (×2): 0.5 mg via INTRAVENOUS

## 2023-08-25 MED ORDER — ACETAMINOPHEN 10 MG/ML IV SOLN
INTRAVENOUS | Status: DC | PRN
  Administered 2023-08-25: 1000 mg via INTRAVENOUS

## 2023-08-25 SURGICAL SUPPLY — 60 items
BAG COUNTER SPONGE SURGICOUNT (BAG) IMPLANT
BIT DRILL 2.5 NCB (BIT) IMPLANT
BIT DRILL 3.3 LONG (BIT) IMPLANT
BLADE CLIPPER SURG (BLADE) IMPLANT
BNDG ELASTIC 4INX 5YD STR LF (GAUZE/BANDAGES/DRESSINGS) IMPLANT
BNDG ELASTIC 4X5.8 VLCR STR LF (GAUZE/BANDAGES/DRESSINGS) ×1 IMPLANT
BNDG ELASTIC 6INX 5YD STR LF (GAUZE/BANDAGES/DRESSINGS) ×1 IMPLANT
BNDG GAUZE DERMACEA FLUFF 4 (GAUZE/BANDAGES/DRESSINGS) ×1 IMPLANT
BRUSH SCRUB EZ PLAIN DRY (MISCELLANEOUS) ×2 IMPLANT
CAP LOCK NCB (Cap) IMPLANT
CHLORAPREP W/TINT 26 (MISCELLANEOUS) ×1 IMPLANT
COVER SURGICAL LIGHT HANDLE (MISCELLANEOUS) ×1 IMPLANT
CUFF TRNQT CYL 34X4.125X (TOURNIQUET CUFF) ×1 IMPLANT
DRAPE C-ARM 42X72 X-RAY (DRAPES) ×1 IMPLANT
DRAPE C-ARMOR (DRAPES) ×1 IMPLANT
DRAPE SURG ORHT 6 SPLT 77X108 (DRAPES) ×2 IMPLANT
DRAPE U-SHAPE 47X51 STRL (DRAPES) ×1 IMPLANT
DRSG ADAPTIC 3X8 NADH LF (GAUZE/BANDAGES/DRESSINGS) ×1 IMPLANT
DRSG MEPITEL 4X7.2 (GAUZE/BANDAGES/DRESSINGS) IMPLANT
ELECTRODE REM PT RTRN 9FT ADLT (ELECTROSURGICAL) ×1 IMPLANT
GAUZE PAD ABD 8X10 STRL (GAUZE/BANDAGES/DRESSINGS) ×4 IMPLANT
GAUZE SPONGE 4X4 12PLY STRL (GAUZE/BANDAGES/DRESSINGS) ×1 IMPLANT
GLOVE BIO SURGEON STRL SZ 6.5 (GLOVE) ×3 IMPLANT
GLOVE BIO SURGEON STRL SZ7.5 (GLOVE) ×4 IMPLANT
GLOVE BIOGEL PI IND STRL 6.5 (GLOVE) ×1 IMPLANT
GLOVE BIOGEL PI IND STRL 7.5 (GLOVE) ×1 IMPLANT
GLOVE XGUARD RR 2 7.5 (GLOVE) ×1 IMPLANT
GOWN STRL REUS W/ TWL LRG LVL3 (GOWN DISPOSABLE) ×2 IMPLANT
IMMOBILIZER KNEE 24 THIGH 36 (SOFTGOODS) IMPLANT
KIT BASIN OR (CUSTOM PROCEDURE TRAY) ×1 IMPLANT
KIT TURNOVER KIT B (KITS) ×1 IMPLANT
KWIRE FXSTD 280X2XNS SS (WIRE) IMPLANT
MANIFOLD NEPTUNE II (INSTRUMENTS) IMPLANT
NS IRRIG 1000ML POUR BTL (IV SOLUTION) ×1 IMPLANT
PACK TOTAL JOINT (CUSTOM PROCEDURE TRAY) ×1 IMPLANT
PAD ARMBOARD POSITIONER FOAM (MISCELLANEOUS) ×2 IMPLANT
PAD CAST 4YDX4 CTTN HI CHSV (CAST SUPPLIES) ×1 IMPLANT
PADDING CAST COTTON 6X4 STRL (CAST SUPPLIES) ×1 IMPLANT
PLATE LOCK LAT 292 RT 13H (Plate) IMPLANT
SCREW HUM NCB PA ST 4X60 (Screw) IMPLANT
SCREW NCB 4.0 28MM (Screw) IMPLANT
SCREW NCB 4.0MX38M (Screw) IMPLANT
SCREW NCB 4.0MX46M (Screw) IMPLANT
SCREW NCB 4.0MX50M (Screw) IMPLANT
SCREW NCB 4.0MX55M (Screw) IMPLANT
SCREW NCB 4.0X26MM (Screw) IMPLANT
SCREW NCB 4.0X40MM (Screw) IMPLANT
SCREW NCB 4.0X75 CORT S/T (Screw) IMPLANT
SPONGE T-LAP 18X18 ~~LOC~~+RFID (SPONGE) IMPLANT
STAPLER SKIN PROX 35W (STAPLE) IMPLANT
SUCTION TUBE FRAZIER 10FR DISP (SUCTIONS) ×1 IMPLANT
SUT ETHILON 3 0 PS 1 (SUTURE) IMPLANT
SUT MNCRL AB 3-0 PS2 18 (SUTURE) ×1 IMPLANT
SUT VIC AB 0 CT1 27XBRD ANBCTR (SUTURE) ×1 IMPLANT
SUT VIC AB 2-0 CT1 TAPERPNT 27 (SUTURE) ×2 IMPLANT
TOWEL GREEN STERILE (TOWEL DISPOSABLE) ×2 IMPLANT
TOWEL GREEN STERILE FF (TOWEL DISPOSABLE) ×1 IMPLANT
TRAY FOLEY MTR SLVR 16FR STAT (SET/KITS/TRAYS/PACK) IMPLANT
UNDERPAD 30X36 HEAVY ABSORB (UNDERPADS AND DIAPERS) ×1 IMPLANT
WATER STERILE IRR 1000ML POUR (IV SOLUTION) ×2 IMPLANT

## 2023-08-25 NOTE — Op Note (Signed)
 Orthopaedic Surgery Operative Note (CSN: [id number] ) Date of Surgery: 08/25/2023  Admit Date: 08/23/2023   Diagnoses: Pre-Op Diagnoses: Right periprosthetic proximal tibia fracture Right tibial shaft fracture  Post-Op Diagnosis: Same  Procedures: CPT 27536-Open reduction internal fixation of right proximal tibia fracture CPT 27758-Open reduction internal fixation of right tibial shaft fracture  Surgeons : Primary: [name] Franky SQUIBB, MD  Assistant: Lauraine Moores, PA-C  Location: OR 3   Anesthesia: General   Antibiotics: Ancef  2g preop with 1 gm vancomycin  powder placed topically   Tourniquet time: None    Estimated Blood Loss: 200 mL  Complications:None   Specimens:* No specimens in log *   Implants: Implant Name Type Inv. Item Serial No. Manufacturer Lot No. LRB No. Used Action  CAP LOCK NCB - [id number] Cap CAP LOCK NCB  [employer](ORTH,TRAU,BIO,SG)  Right 7 Implanted  PLATE LOCK LAT 707 RT 13H - [id number] Plate PLATE LOCK LAT 707 RT 13H  [employer](ORTH,TRAU,BIO,SG)  Right 1 Implanted  SCREW NCB 4.0 - [id number] Screw SCREW NCB 4.0  [employer](ORTH,TRAU,BIO,SG)  Right 2 Implanted  SCREW NCB 4.0X40MM - [id number] Screw SCREW NCB 4.0X40MM  [employer](ORTH,TRAU,BIO,SG)  Right 1 Implanted  SCREW NCB 4.9FK53F - [id number] Screw SCREW NCB 4.9FK53F  [employer](ORTH,TRAU,BIO,SG)  Right 1 Implanted  SCREW HUM NCB PA ST 4X60 - [id number] Screw SCREW HUM NCB PA ST 4X60  [employer](ORTH,TRAU,BIO,SG)  Right 1 Implanted  SCREW NCB 4.0MX38M - [id number] Screw SCREW NCB 4.0MX38M  [employer](ORTH,TRAU,BIO,SG)  Right 1 Implanted  SCREW NCB 4.0X75 CORT S/T - [id number] Screw SCREW NCB 4.0X75 CORT S/T  [employer](ORTH,TRAU,BIO,SG)  Right 1 Implanted  SCREW NCB 4.0X26MM - [id number] Screw SCREW NCB 4.0X26MM  [employer](ORTH,TRAU,BIO,SG)  Right 1 Implanted  SCREW NCB 4.9FK44F - [id number] Screw SCREW NCB 4.9FK44F  [employer](ORTH,TRAU,BIO,SG)  Right 1 Implanted   SCREW NCB 4.0MX50M - [id number] Screw SCREW NCB 4.0MX50M  [employer](ORTH,TRAU,BIO,SG)  Right 1 Implanted     Indications for Surgery: -year-old male who sustained a ground-level fall with a right proximal tibial shaft and tibia fracture.  Due to the unstable nature of his injury I recommend proceeding with open reduction internal fixation.  Risk and benefits are discussed with the patient.  Risk include but not limited to bleeding, infection, malunion, nonunion, hardware failure, hardware irritation, nerve and blood vessel injury, knee stiffness, DVT, even the possible anesthetic complications.  He agreed to proceed with surgery and consent was obtained.  Operative Findings: 1.  Open duction to fixation of right proximal tibia fracture and right tibial shaft fracture using [employer] NCB 13 hole plate. 2.  Degloving injury around proximal tibia fracture.  Procedure: The patient was identified in the preoperative holding area. Consent was confirmed with the patient and their family and all questions were answered. The operative extremity was marked after confirmation with the patient. he was then brought back to the operating room by our anesthesia colleagues.  He was placed under general anesthetic and carefully transferred over to radiolucent flattop table.  A bump was placed under his operative hip.  The right lower extremity was then prepped and draped in usual sterile fashion.  Timeout was performed to verify the patient, the procedure, and the extremity.  Preoperative antibiotics were dosed.  Fluoroscopic imaging showed the unstable nature of his injury.  Gentle traction was applied to align the tibial shaft component as well as the proximal tibia.  There was significant skin blistering medially but laterally there was relatively  no skin compromise.  There was some posterior lateral but this was out of the site of the incision.  I made a curvilinear incision along the proximal tibia.   Carried it down through skin subcutaneous tissue.  Here encountered a burst of serosanguineous fluid consistent with a large degloving injury.  This wrapped around from medial to lateral I was able to remove all the fluid that had developed.  There is no signs of any infection and I felt the procedure was most appropriate.  I then incised through the IT band and released the IT band off in the proximal tibia.  I released it all the way posterior until I can feel the proximal fibula.  I then developed an interval between the anterior tibialis musculature and the lateral cortex.  We then chose a 13 hole [employer] NCB proximal tibial locking plate we attached to a targeting arm and slid submuscularly along the lateral cortex of the tibia.  I held the proximal portion of place with a 2.0 mm K wire.  I then percutaneously held the distal portion with a 3.3 mm drill bit.  Once I was pleased with the position of the plate I then drilled and placed a 4.0 millimeter screws around the prosthesis proximally to bring the plate flush to bone.  A total of 3 screws were placed proximally.  I then percutaneously placed 4.0 millimeter screws in the distal portion.  3 screws were placed distal to the fracture site.  I then placed another screw holding the medial butterfly fragment distally and another one proximally.  I placed locking caps on the middle 2 screws distally in the shaft screw proximally.  I then removed the targeting arm and placed 2 more screws.  Locking caps were placed in all the proximal screws.  Final fluoroscopic imaging was obtained.  The incision was copes irrigated.  A gram of vancomycin  powder was placed to the incision.  A layered closure of 0 Vicryl, 2-0 Monocryl and 3-0 nylon was used to close the skin.  Sterile dressing was applied.  The patient was awoken from anesthesia and taken to the PACU in stable condition.  Post Op Plan/Instructions: The patient will be nonweightbearing to the right lower  extremity.  He will receive postoperative Ancef .  He will be placed on Lovenox  for DVT prophylaxis and discharged on aspirin .  Will have him mobilize with physical and Occupational Therapy.  I will ask that he be in a knee immobilizer during mobilization with therapy but we can start gentle range of motion of the knee passive and actively.  The knee immobilizer is for support of the soft tissues.  I was present and performed the entire surgery.  Lauraine Moores, PA-C did assist me throughout the case. An assistant was necessary given the difficulty in approach, maintenance of reduction and ability to instrument the fracture.   Franky Light, MD [facility]

## 2023-08-25 NOTE — Interval H&P Note (Signed)
 History and Physical Interval Note:  08/25/2023 12:09 PM  Joe Coleman  has presented today for surgery, with the diagnosis of Right periprosthetic tibia fracture.  The various methods of treatment have been discussed with the patient and family. After consideration of risks, benefits and other options for treatment, the patient has consented to  Procedure(s): OPEN REDUCTION INTERNAL FIXATION (ORIF) TIBIA FRACTURE (Right) as a surgical intervention.  The patient's history has been reviewed, patient examined, no change in status, stable for surgery.  I have reviewed the patient's chart and labs.  Questions were answered to the patient's satisfaction.     Clarence Dunsmore P Mariama Saintvil

## 2023-08-25 NOTE — Anesthesia Postprocedure Evaluation (Signed)
 Anesthesia Post Note  Patient: Joe Coleman  Procedure(s) Performed: OPEN REDUCTION INTERNAL FIXATION (ORIF) TIBIA FRACTURE, RIGHT (Right: Leg Lower)     Patient location during evaluation: PACU Anesthesia Type: General Level of consciousness: oriented, patient cooperative and sedated Pain management: pain level controlled Vital Signs Assessment: post-procedure vital signs reviewed and stable Respiratory status: spontaneous breathing, nonlabored ventilation, respiratory function stable and patient connected to nasal cannula oxygen Cardiovascular status: blood pressure returned to baseline and stable Postop Assessment: no apparent nausea or vomiting Anesthetic complications: no   No notable events documented.  Last Vitals:  Vitals:   08/25/23 1058 08/25/23 1400  BP: 134/73   Pulse: (!) 101   Resp: 16   Temp: 36.9 C 37.1 C  SpO2: 93%     Last Pain:  Vitals:   08/25/23 1400  TempSrc:   PainSc: 0-No pain                 Angely Dietz,E. Vikram Tillett
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
## 2023-08-25 NOTE — Anesthesia Procedure Notes (Signed)
 Procedure Name: Intubation Date/Time: 08/25/2023 12:24 PM  Performed by: Harrold Macintosh, CRNAPre-anesthesia Checklist: Patient identified, Emergency Drugs available, Suction available and Patient being monitored Patient Re-evaluated:Patient Re-evaluated prior to induction Oxygen Delivery Method: Circle system utilized Preoxygenation: Pre-oxygenation with 100% oxygen Induction Type: IV induction Ventilation: Two handed mask ventilation required and Oral airway inserted - appropriate to patient size Laryngoscope Size: Miller and 3 Grade View: Grade II Tube type: Oral Tube size: 7.5 mm Number of attempts: 1 Airway Equipment and Method: Stylet and Oral airway Placement Confirmation: ETT inserted through vocal cords under direct vision, positive ETCO2 and breath sounds checked- equal and bilateral Secured at: 23 cm Tube secured with: Tape Dental Injury: Teeth and Oropharynx as per pre-operative assessment  Comments: Mask seal obtained using OAW & large tegaderm over facial hair. Grade IIB view w/BURP maneuver, challenging visualization with long mustache whiskers.

## 2023-08-25 NOTE — Transfer of Care (Signed)
 Immediate Anesthesia Transfer of Care Note  Patient: Joe Coleman  Procedure(s) Performed: OPEN REDUCTION INTERNAL FIXATION (ORIF) TIBIA FRACTURE, RIGHT (Right: Leg Lower)  Patient Location: PACU  Anesthesia Type:General  Level of Consciousness: awake, alert , and oriented  Airway & Oxygen Therapy: Patient Spontanous Breathing and Patient connected to face mask oxygen  Post-op Assessment: Report given to RN, Post -op Vital signs reviewed and stable, Patient moving all extremities X 4, and Patient able to stick tongue midline  Post vital signs: Reviewed and stable  Last Vitals:  Vitals Value Taken Time  BP 177/71 08/25/23 13:57  Temp 98.8   Pulse 98 08/25/23 13:58  Resp 24 08/25/23 13:58  SpO2 100 % 08/25/23 13:58  Vitals shown include unfiled device data.  Last Pain:  Vitals:   08/25/23 1058  TempSrc: Oral  PainSc: 9       Patients Stated Pain Goal: 2 (08/24/23 1954)  Complications: No notable events documented.

## 2023-08-25 NOTE — Consult Note (Signed)
 Orthopaedic Trauma Service (OTS) Consult   Patient ID: Joe Coleman MRN: [id number] DOB/AGE: 1945-09-14 78 y.o.  Reason for Consult:Right periprosthetic proximal tibia fracture Referring Physician: Dr. Rolan Higashi, MD [facility]   HPI: Joe Coleman is an 78 y.o. male who is being seen in consultation at the request of Dr. Higashi for evaluation of right proximal tibia periprosthetic fracture.  Patient a ground-level fall sustained the above injury.  He was placed in a long-leg splint.  Due to the complexity of his fracture it was recommended that orthopedic traumatologist take care of him.  He has a history of COPD as well as a diabetes with for which he takes metformin .  He ambulates without assist device he lives alone.  He does state that he has a family that he can stay with afterwards.  Denies any other injuries.  Denies any numbness or tingling in his lower extremity.  Past Medical History:  Diagnosis Date   Cancer (HCC)    stomach and colon   Diabetes mellitus without complication (HCC)    Hypertension     Past Surgical History:  Procedure Laterality Date   APPENDECTOMY     COLON SURGERY      Family History  Problem Relation Age of Onset   Heart disease Mother    Heart disease Brother     Social History:  reports that he has quit smoking. He has never used smokeless tobacco. He reports that he does not drink alcohol and does not use drugs.  Allergies:  Allergies  Allergen Reactions   Chicken Allergy Nausea And Vomiting        Losartan     Hyperkalemia (>5.5 multiple times on losartan 25mg )   Poultry Meal Nausea And Vomiting    Medications:  No current facility-administered medications on file prior to encounter.   Current Outpatient Medications on File Prior to Encounter  Medication Sig Dispense Refill   albuterol  (PROVENTIL  HFA;VENTOLIN  HFA) 108 (90 Base) MCG/ACT inhaler Inhale 2 puffs into the lungs every 6 (six) hours as needed for  wheezing or shortness of breath. 1 Inhaler 2   gabapentin  (NEURONTIN ) 300 MG capsule Take 300 mg by mouth at bedtime.     TRELEGY ELLIPTA 200-62.5-25 MCG/ACT AEPB Inhale 1 puff into the lungs daily.     oxyCODONE  (ROXICODONE ) 5 MG immediate release tablet Take 1 tablet (5 mg total) by mouth every 8 (eight) hours as needed. (Patient not taking: Reported on 08/24/2023) 12 tablet 0     ROS: Constitutional: No fever or chills Vision: No changes in vision ENT: No difficulty swallowing CV: No chest pain Pulm: No SOB or wheezing GI: No nausea or vomiting GU: No urgency or inability to hold urine Skin: No poor wound healing Neurologic: No numbness or tingling Psychiatric: No depression or anxiety Heme: No bruising Allergic: No reaction to medications or food   Exam: Blood pressure 134/73, pulse (!) 101, temperature 98.5 F (36.9 C), temperature source Oral, resp. rate 16, height 5' 8.5 (1.74 m), weight 68 kg, SpO2 93%. General: No acute distress Orientation: Awake alert and oriented x 3 Mood and Affect: Cooperative and pleasant Gait: Unable to assess due to his fracture Coordination and balance: Within normal limits  Right lower extremity: Long-leg splint is in place and is clean dry and intact.  I did not take down to evaluate soft tissues.  Compartments are soft compressible through the splint.  He is able to actively dorsiflex and plantarflex his foot and  ankle.  He is warm well-perfused foot with brisk cap refill he is endorses sensation to the entirety of his foot.  Left lower extremity: Skin without lesions. No tenderness to palpation. Full painless ROM, full strength in each muscle groups without evidence of instability.   Medical Decision Making: Data: Imaging: X-rays are reviewed which shows a periprosthetic proximal tibia with tibial shaft extension.  The prosthesis appears to be without instability.  Labs:  Results for orders placed or performed during the hospital encounter  of 08/23/23 (from the past 24 hours)  Glucose, capillary     Status: Abnormal   Collection Time: 08/24/23  4:34 PM  Result Value Ref Range   Glucose-Capillary 180 (H) 70 - 99 mg/dL   Comment 1 Notify RN    Comment 2 Document in Chart   Prepare RBC (crossmatch)     Status: None   Collection Time: 08/24/23  5:58 PM  Result Value Ref Range   Order Confirmation      ORDER PROCESSED BY BLOOD BANK Performed at [facility], [address], [place], [place] [address]   Type and screen [facility]     Status: None   Collection Time: 08/24/23  6:30 PM  Result Value Ref Range   ABO/RH(D) O POS    Antibody Screen NEG    Sample Expiration 08/27/2023,2359    Unit Number [id number]    Blood Component Type RED CELLS,LR    Unit division 00    Status of Unit ISSUED,FINAL    Transfusion Status OK TO TRANSFUSE    Crossmatch Result Compatible    Unit Number [id number]    Blood Component Type RED CELLS,LR    Unit division 00    Status of Unit ISSUED,FINAL    Transfusion Status OK TO TRANSFUSE    Crossmatch Result      Compatible Performed at [facility], [address] [place], [place] [address]   Surgical PCR screen     Status: None   Collection Time: 08/24/23  8:36 PM   Specimen: Nasal Mucosa; Nasal Swab  Result Value Ref Range   MRSA, PCR NEGATIVE NEGATIVE   Staphylococcus aureus NEGATIVE NEGATIVE  Glucose, capillary     Status: Abnormal   Collection Time: 08/24/23 10:02 PM  Result Value Ref Range   Glucose-Capillary 156 (H) 70 - 99 mg/dL   Comment 1 Document in Chart   Basic metabolic panel with GFR     Status: Abnormal   Collection Time: 08/25/23  4:20 AM  Result Value Ref Range   Sodium 137 135 - 145 mmol/L   Potassium 5.3 (H) 3.5 - 5.1 mmol/L   Chloride 107 98 - 111 mmol/L   CO2 22 22 - 32 mmol/L   Glucose, Bld 174 (H) 70 - 99 mg/dL   BUN 24 (H) 8 - 23 mg/dL   Creatinine, Ser 8.17 (H) 0.61 - 1.24 mg/dL   Calcium 8.3 (L) 8.9 - 10.3 mg/dL    GFR, Estimated 37 (L) >60 mL/min   Anion gap 8 5 - 15  CBC     Status: Abnormal   Collection Time: 08/25/23  4:20 AM  Result Value Ref Range   WBC 11.3 (H) 4.0 - 10.5 K/uL   RBC 3.12 (L) 4.22 - 5.81 MIL/uL   Hemoglobin 8.7 (L) 13.0 - 17.0 g/dL   HCT 72.8 (L) 60.9 - 47.9 %   MCV 86.9 80.0 - 100.0 fL   MCH 27.9 26.0 -  34.0 pg   MCHC 32.1 30.0 - 36.0 g/dL   RDW 83.7 (H) 88.4 - 84.4 %   Platelets 157 150 - 400 K/uL   nRBC 0.0 0.0 - 0.2 %  Magnesium     Status: None   Collection Time: 08/25/23  4:20 AM  Result Value Ref Range   Magnesium 1.9 1.7 - 2.4 mg/dL  Phosphorus     Status: None   Collection Time: 08/25/23  4:20 AM  Result Value Ref Range   Phosphorus 3.6 2.5 - 4.6 mg/dL  Glucose, capillary     Status: Abnormal   Collection Time: 08/25/23  6:38 AM  Result Value Ref Range   Glucose-Capillary 136 (H) 70 - 99 mg/dL  Glucose, capillary     Status: Abnormal   Collection Time: 08/25/23 10:31 AM  Result Value Ref Range   Glucose-Capillary 134 (H) 70 - 99 mg/dL     Imaging or Labs ordered: None  Medical history and chart was reviewed and case discussed with medical provider.  Assessment/Plan: 78 year old male with periprosthetic right proximal tibia fracture with tibial shaft extension.    Will plan for open reduction internal fixation of his right tibia.  Risks and benefits were discussed with the patient.  Risks included but not limited to bleeding, infection, malunion,, hardware irritation, nerve or blood vessel injury, DVT, even the possibility anesthetic complications.  He agreed to proceed with surgery and consent was obtained.  Franky MYRTIS Light, MD [facility] [phone] (office) [email or web address]

## 2023-08-25 NOTE — H&P (View-Only) (Signed)
 Orthopaedic Trauma Service (OTS) Consult   Patient ID: [name] MRN: [id number] DOB/AGE: 1945-09-14 78 y.o.  Reason for Consult:Right periprosthetic proximal tibia fracture Referring Physician: Dr. Rolan Higashi, MD [facility]   HPI: Joe Coleman is an 78 y.o. male who is being seen in consultation at the request of Dr. Higashi for evaluation of right proximal tibia periprosthetic fracture.  Patient a ground-level fall sustained the above injury.  He was placed in a long-leg splint.  Due to the complexity of his fracture it was recommended that orthopedic traumatologist take care of him.  He has a history of COPD as well as a diabetes with for which he takes metformin .  He ambulates without assist device he lives alone.  He does state that he has a family that he can stay with afterwards.  Denies any other injuries.  Denies any numbness or tingling in his lower extremity.  Past Medical History:  Diagnosis Date   Cancer (HCC)    stomach and colon   Diabetes mellitus without complication (HCC)    Hypertension     Past Surgical History:  Procedure Laterality Date   APPENDECTOMY     COLON SURGERY      Family History  Problem Relation Age of Onset   Heart disease Mother    Heart disease Brother     Social History:  reports that he has quit smoking. He has never used smokeless tobacco. He reports that he does not drink alcohol and does not use drugs.  Allergies:  Allergies  Allergen Reactions   Chicken Allergy Nausea And Vomiting        Losartan     Hyperkalemia (>5.5 multiple times on losartan 25mg )   Poultry Meal Nausea And Vomiting    Medications:  No current facility-administered medications on file prior to encounter.   Current Outpatient Medications on File Prior to Encounter  Medication Sig Dispense Refill   albuterol  (PROVENTIL  HFA;VENTOLIN  HFA) 108 (90 Base) MCG/ACT inhaler Inhale 2 puffs into the lungs every 6 (six) hours as needed for  wheezing or shortness of breath. 1 Inhaler 2   gabapentin  (NEURONTIN ) 300 MG capsule Take 300 mg by mouth at bedtime.     TRELEGY ELLIPTA 200-62.5-25 MCG/ACT AEPB Inhale 1 puff into the lungs daily.     oxyCODONE  (ROXICODONE ) 5 MG immediate release tablet Take 1 tablet (5 mg total) by mouth every 8 (eight) hours as needed. (Patient not taking: Reported on 08/24/2023) 12 tablet 0     ROS: Constitutional: No fever or chills Vision: No changes in vision ENT: No difficulty swallowing CV: No chest pain Pulm: No SOB or wheezing GI: No nausea or vomiting GU: No urgency or inability to hold urine Skin: No poor wound healing Neurologic: No numbness or tingling Psychiatric: No depression or anxiety Heme: No bruising Allergic: No reaction to medications or food   Exam: Blood pressure 134/73, pulse (!) 101, temperature 98.5 F (36.9 C), temperature source Oral, resp. rate 16, height 5' 8.5 (1.74 m), weight 68 kg, SpO2 93%. General: No acute distress Orientation: Awake alert and oriented x 3 Mood and Affect: Cooperative and pleasant Gait: Unable to assess due to his fracture Coordination and balance: Within normal limits  Right lower extremity: Long-leg splint is in place and is clean dry and intact.  I did not take down to evaluate soft tissues.  Compartments are soft compressible through the splint.  He is able to actively dorsiflex and plantarflex his foot and  ankle.  He is warm well-perfused foot with brisk cap refill he is endorses sensation to the entirety of his foot.  Left lower extremity: Skin without lesions. No tenderness to palpation. Full painless ROM, full strength in each muscle groups without evidence of instability.   Medical Decision Making: Data: Imaging: X-rays are reviewed which shows a periprosthetic proximal tibia with tibial shaft extension.  The prosthesis appears to be without instability.  Labs:  Results for orders placed or performed during the hospital encounter  of 08/23/23 (from the past 24 hours)  Glucose, capillary     Status: Abnormal   Collection Time: 08/24/23  4:34 PM  Result Value Ref Range   Glucose-Capillary 180 (H) 70 - 99 mg/dL   Comment 1 Notify RN    Comment 2 Document in Chart   Prepare RBC (crossmatch)     Status: None   Collection Time: 08/24/23  5:58 PM  Result Value Ref Range   Order Confirmation      ORDER PROCESSED BY BLOOD BANK Performed at [facility], [address], [place], [place] [address]   Type and screen [facility]     Status: None   Collection Time: 08/24/23  6:30 PM  Result Value Ref Range   ABO/RH(D) O POS    Antibody Screen NEG    Sample Expiration 08/27/2023,2359    Unit Number [id number]    Blood Component Type RED CELLS,LR    Unit division 00    Status of Unit ISSUED,FINAL    Transfusion Status OK TO TRANSFUSE    Crossmatch Result Compatible    Unit Number [id number]    Blood Component Type RED CELLS,LR    Unit division 00    Status of Unit ISSUED,FINAL    Transfusion Status OK TO TRANSFUSE    Crossmatch Result      Compatible Performed at [facility], [address] [place], [place] [address]   Surgical PCR screen     Status: None   Collection Time: 08/24/23  8:36 PM   Specimen: Nasal Mucosa; Nasal Swab  Result Value Ref Range   MRSA, PCR NEGATIVE NEGATIVE   Staphylococcus aureus NEGATIVE NEGATIVE  Glucose, capillary     Status: Abnormal   Collection Time: 08/24/23 10:02 PM  Result Value Ref Range   Glucose-Capillary 156 (H) 70 - 99 mg/dL   Comment 1 Document in Chart   Basic metabolic panel with GFR     Status: Abnormal   Collection Time: 08/25/23  4:20 AM  Result Value Ref Range   Sodium 137 135 - 145 mmol/L   Potassium 5.3 (H) 3.5 - 5.1 mmol/L   Chloride 107 98 - 111 mmol/L   CO2 22 22 - 32 mmol/L   Glucose, Bld 174 (H) 70 - 99 mg/dL   BUN 24 (H) 8 - 23 mg/dL   Creatinine, Ser 8.17 (H) 0.61 - 1.24 mg/dL   Calcium 8.3 (L) 8.9 - 10.3 mg/dL    GFR, Estimated 37 (L) >60 mL/min   Anion gap 8 5 - 15  CBC     Status: Abnormal   Collection Time: 08/25/23  4:20 AM  Result Value Ref Range   WBC 11.3 (H) 4.0 - 10.5 K/uL   RBC 3.12 (L) 4.22 - 5.81 MIL/uL   Hemoglobin 8.7 (L) 13.0 - 17.0 g/dL   HCT 72.8 (L) 60.9 - 47.9 %   MCV 86.9 80.0 - 100.0 fL   MCH 27.9 26.0 -  34.0 pg   MCHC 32.1 30.0 - 36.0 g/dL   RDW 83.7 (H) 88.4 - 84.4 %   Platelets 157 150 - 400 K/uL   nRBC 0.0 0.0 - 0.2 %  Magnesium     Status: None   Collection Time: 08/25/23  4:20 AM  Result Value Ref Range   Magnesium 1.9 1.7 - 2.4 mg/dL  Phosphorus     Status: None   Collection Time: 08/25/23  4:20 AM  Result Value Ref Range   Phosphorus 3.6 2.5 - 4.6 mg/dL  Glucose, capillary     Status: Abnormal   Collection Time: 08/25/23  6:38 AM  Result Value Ref Range   Glucose-Capillary 136 (H) 70 - 99 mg/dL  Glucose, capillary     Status: Abnormal   Collection Time: 08/25/23 10:31 AM  Result Value Ref Range   Glucose-Capillary 134 (H) 70 - 99 mg/dL     Imaging or Labs ordered: None  Medical history and chart was reviewed and case discussed with medical provider.  Assessment/Plan: 78 year old male with periprosthetic right proximal tibia fracture with tibial shaft extension.    Will plan for open reduction internal fixation of his right tibia.  Risks and benefits were discussed with the patient.  Risks included but not limited to bleeding, infection, malunion,, hardware irritation, nerve or blood vessel injury, DVT, even the possibility anesthetic complications.  He agreed to proceed with surgery and consent was obtained.  Joe MYRTIS Light, MD [facility] [phone] (office) [email or web address]

## 2023-08-25 NOTE — Anesthesia Preprocedure Evaluation (Signed)
 Anesthesia Evaluation  Patient identified by MRN, date of birth, ID band Patient awake    Reviewed: Allergy & Precautions, NPO status , Patient's Chart, lab work & pertinent test results  Airway Mallampati: II  TM Distance: >3 FB Neck ROM: Full    Dental  (+) Edentulous Upper, Edentulous Lower   Pulmonary asthma , COPD, former smoker   Pulmonary exam normal        Cardiovascular hypertension,  Rhythm:Regular Rate:Normal     Neuro/Psych    Depression    negative neurological ROS     GI/Hepatic negative GI ROS, Neg liver ROS,,,  Endo/Other  diabetes    Renal/GU   negative genitourinary   Musculoskeletal Tibial fracture    Abdominal Normal abdominal exam  (+)   Peds  Hematology  (+) Blood dyscrasia, anemia Lab Results      Component                Value               Date                      WBC                      11.3 (H)            08/25/2023                HGB                      8.7 (L)             08/25/2023                HCT                      27.1 (L)            08/25/2023                MCV                      86.9                08/25/2023                PLT                      157                 08/25/2023              Anesthesia Other Findings   Reproductive/Obstetrics                              Anesthesia Physical Anesthesia Plan  ASA: 3  Anesthesia Plan: General   Post-op Pain Management: Ofirmev  IV (intra-op)*   Induction: Intravenous  PONV Risk Score and Plan: 2 and Ondansetron , Dexamethasone  and Treatment may vary due to age or medical condition  Airway Management Planned: Mask and Oral ETT  Additional Equipment: None  Intra-op Plan:   Post-operative Plan: Extubation in OR  Informed Consent: I have reviewed the patients History and Physical, chart, labs and discussed the procedure including the risks, benefits and alternatives for the proposed  anesthesia with the patient or authorized representative who has indicated his/her  understanding and acceptance.     Dental advisory given  Plan Discussed with: CRNA  Anesthesia Plan Comments:         Anesthesia Quick Evaluation

## 2023-08-25 NOTE — Plan of Care (Signed)
   Problem: Coping: Goal: Ability to adjust to condition or change in health will improve Outcome: Progressing   Problem: Nutritional: Goal: Maintenance of adequate nutrition will improve Outcome: Progressing

## 2023-08-26 ENCOUNTER — Encounter (HOSPITAL_COMMUNITY): Payer: Self-pay | Admitting: Student

## 2023-08-26 DIAGNOSIS — S82401D Unspecified fracture of shaft of right fibula, subsequent encounter for closed fracture with routine healing: Secondary | ICD-10-CM | POA: Diagnosis not present

## 2023-08-26 DIAGNOSIS — S82201D Unspecified fracture of shaft of right tibia, subsequent encounter for closed fracture with routine healing: Secondary | ICD-10-CM | POA: Diagnosis not present

## 2023-08-26 LAB — BASIC METABOLIC PANEL WITH GFR
Anion gap: 6 (ref 5–15)
BUN: 21 mg/dL (ref 8–23)
CO2: 23 mmol/L (ref 22–32)
Calcium: 7.9 mg/dL — ABNORMAL LOW (ref 8.9–10.3)
Chloride: 106 mmol/L (ref 98–111)
Creatinine, Ser: 2.02 mg/dL — ABNORMAL HIGH (ref 0.61–1.24)
GFR, Estimated: 33 mL/min — ABNORMAL LOW (ref >60)
Glucose, Bld: 221 mg/dL — ABNORMAL HIGH (ref 70–99)
Potassium: 5.7 mmol/L — ABNORMAL HIGH (ref 3.5–5.1)
Sodium: 135 mmol/L (ref 135–145)

## 2023-08-26 LAB — GLUCOSE, CAPILLARY
Glucose-Capillary: 166 mg/dL — ABNORMAL HIGH (ref 70–99)
Glucose-Capillary: 177 mg/dL — ABNORMAL HIGH (ref 70–99)
Glucose-Capillary: 209 mg/dL — ABNORMAL HIGH (ref 70–99)
Glucose-Capillary: 255 mg/dL — ABNORMAL HIGH (ref 70–99)

## 2023-08-26 LAB — VITAMIN D 25 HYDROXY (VIT D DEFICIENCY, FRACTURES): Vit D, 25-Hydroxy: 28.89 ng/mL — ABNORMAL LOW (ref 30–100)

## 2023-08-26 LAB — CBC
HCT: 22.9 % — ABNORMAL LOW (ref 39.0–52.0)
Hemoglobin: 7.1 g/dL — ABNORMAL LOW (ref 13.0–17.0)
MCH: 27.6 pg (ref 26.0–34.0)
MCHC: 31 g/dL (ref 30.0–36.0)
MCV: 89.1 fL (ref 80.0–100.0)
Platelets: 137 K/uL — ABNORMAL LOW (ref 150–400)
RBC: 2.57 MIL/uL — ABNORMAL LOW (ref 4.22–5.81)
RDW: 15.9 % — ABNORMAL HIGH (ref 11.5–15.5)
WBC: 11.2 K/uL — ABNORMAL HIGH (ref 4.0–10.5)
nRBC: 0 % (ref 0.0–0.2)

## 2023-08-26 LAB — PREPARE RBC (CROSSMATCH)

## 2023-08-26 LAB — MAGNESIUM: Magnesium: 1.8 mg/dL (ref 1.7–2.4)

## 2023-08-26 MED ORDER — POLYETHYLENE GLYCOL 3350 17 G PO PACK: 17.0000 g | PACK | Freq: Every day | ORAL | Status: DC | PRN

## 2023-08-26 MED ORDER — METOCLOPRAMIDE HCL 5 MG PO TABS: 5.0000 mg | ORAL_TABLET | Freq: Three times a day (TID) | ORAL | Status: DC | PRN

## 2023-08-26 MED ORDER — ACETAMINOPHEN 650 MG RE SUPP: 650.0000 mg | Freq: Four times a day (QID) | RECTAL | Status: DC

## 2023-08-26 MED ORDER — MORPHINE SULFATE (PF) 2 MG/ML IV SOLN
2.0000 mg | INTRAVENOUS | Status: DC | PRN
  Administered 2023-08-26 – 2023-08-27 (×4): 2 mg via INTRAVENOUS
  Filled 2023-08-26 (×4): qty 1

## 2023-08-26 MED ORDER — METHOCARBAMOL 1000 MG/10ML IJ SOLN: 500.0000 mg | Freq: Four times a day (QID) | INTRAMUSCULAR | Status: DC | PRN

## 2023-08-26 MED ORDER — DIPHENHYDRAMINE HCL 12.5 MG/5ML PO ELIX
12.5000 mg | ORAL_SOLUTION | ORAL | Status: DC | PRN
  Filled 2023-08-26: qty 10

## 2023-08-26 MED ORDER — METHOCARBAMOL 500 MG PO TABS
500.0000 mg | ORAL_TABLET | Freq: Four times a day (QID) | ORAL | Status: DC | PRN
  Administered 2023-08-26 – 2023-09-01 (×12): 500 mg via ORAL
  Filled 2023-08-26 (×12): qty 1

## 2023-08-26 MED ORDER — SODIUM CHLORIDE 0.9% IV SOLUTION: Freq: Once | INTRAVENOUS | Status: AC

## 2023-08-26 MED ORDER — CEFAZOLIN SODIUM-DEXTROSE 2-4 GM/100ML-% IV SOLN
2.0000 g | Freq: Three times a day (TID) | INTRAVENOUS | Status: AC
  Administered 2023-08-26 (×2): 2 g via INTRAVENOUS
  Filled 2023-08-26 (×2): qty 100

## 2023-08-26 MED ORDER — OXYCODONE HCL 5 MG PO TABS
5.0000 mg | ORAL_TABLET | ORAL | Status: DC | PRN
  Administered 2023-08-26 – 2023-08-27 (×5): 10 mg via ORAL
  Filled 2023-08-26 (×5): qty 2

## 2023-08-26 MED ORDER — SODIUM CHLORIDE 0.9% FLUSH: 10.0000 mL | INTRAVENOUS | Status: DC | PRN

## 2023-08-26 MED ORDER — SODIUM CHLORIDE 0.9% FLUSH
10.0000 mL | Freq: Two times a day (BID) | INTRAVENOUS | Status: DC
  Administered 2023-08-26 (×2): 10 mL
  Administered 2023-08-27: 20 mL
  Administered 2023-08-27: 10 mL
  Administered 2023-08-28: 20 mL
  Administered 2023-08-28 – 2023-09-01 (×8): 10 mL

## 2023-08-26 MED ORDER — GUAIFENESIN-DM 100-10 MG/5ML PO SYRP
5.0000 mL | ORAL_SOLUTION | ORAL | Status: DC | PRN
  Administered 2023-08-26 – 2023-08-31 (×4): 5 mL via ORAL
  Filled 2023-08-26 (×4): qty 5

## 2023-08-26 MED ORDER — ACETAMINOPHEN 325 MG PO TABS
650.0000 mg | ORAL_TABLET | Freq: Four times a day (QID) | ORAL | Status: DC
  Administered 2023-08-26 – 2023-08-27 (×6): 650 mg via ORAL
  Filled 2023-08-26 (×6): qty 2

## 2023-08-26 MED ORDER — ENOXAPARIN SODIUM 40 MG/0.4ML IJ SOSY
40.0000 mg | PREFILLED_SYRINGE | INTRAMUSCULAR | Status: DC
  Administered 2023-08-26: 40 mg via SUBCUTANEOUS
  Filled 2023-08-26: qty 0.4

## 2023-08-26 MED ORDER — DOCUSATE SODIUM 100 MG PO CAPS
100.0000 mg | ORAL_CAPSULE | Freq: Two times a day (BID) | ORAL | Status: DC
  Administered 2023-08-26 – 2023-08-28 (×6): 100 mg via ORAL
  Filled 2023-08-26 (×6): qty 1

## 2023-08-26 MED ORDER — METOCLOPRAMIDE HCL 5 MG/ML IJ SOLN: 5.0000 mg | Freq: Three times a day (TID) | INTRAMUSCULAR | Status: DC | PRN

## 2023-08-26 MED ORDER — SODIUM ZIRCONIUM CYCLOSILICATE 10 G PO PACK
10.0000 g | PACK | Freq: Three times a day (TID) | ORAL | Status: AC
  Administered 2023-08-26 (×3): 10 g via ORAL
  Filled 2023-08-26 (×3): qty 1

## 2023-08-26 MED ORDER — ENOXAPARIN SODIUM 30 MG/0.3ML IJ SOSY
30.0000 mg | PREFILLED_SYRINGE | INTRAMUSCULAR | Status: DC
  Administered 2023-08-27 – 2023-08-31 (×5): 30 mg via SUBCUTANEOUS
  Filled 2023-08-26 (×5): qty 0.3

## 2023-08-26 NOTE — Progress Notes (Signed)
 Ortho Note  Having a good deal of pain this AM. No major other complaints. A little anxious about starting therapy this AM  PE: Dressing clean and dry, swelling appropriate with soft and compressible compartments. Wiggles toes and endorses sensation to foot. Warm and well perfused foot  Imaging: stable postop imaging  Labs:  Results for orders placed or performed during the hospital encounter of 08/23/23 (from the past 24 hours)  Glucose, capillary     Status: Abnormal   Collection Time: 08/25/23 10:31 AM  Result Value Ref Range   Glucose-Capillary 134 (H) 70 - 99 mg/dL  Glucose, capillary     Status: Abnormal   Collection Time: 08/25/23  2:02 PM  Result Value Ref Range   Glucose-Capillary 146 (H) 70 - 99 mg/dL  Glucose, capillary     Status: Abnormal   Collection Time: 08/25/23  4:38 PM  Result Value Ref Range   Glucose-Capillary 164 (H) 70 - 99 mg/dL  Glucose, capillary     Status: Abnormal   Collection Time: 08/25/23  9:19 PM  Result Value Ref Range   Glucose-Capillary 166 (H) 70 - 99 mg/dL   Comment 1 Notify RN    Comment 2 Document in Chart   Basic metabolic panel with GFR     Status: Abnormal   Collection Time: 08/26/23  3:45 AM  Result Value Ref Range   Sodium 135 135 - 145 mmol/L   Potassium 5.7 (H) 3.5 - 5.1 mmol/L   Chloride 106 98 - 111 mmol/L   CO2 23 22 - 32 mmol/L   Glucose, Bld 221 (H) 70 - 99 mg/dL   BUN 21 8 - 23 mg/dL   Creatinine, Ser 7.97 (H) 0.61 - 1.24 mg/dL   Calcium 7.9 (L) 8.9 - 10.3 mg/dL   GFR, Estimated 33 (L) >60 mL/min   Anion gap 6 5 - 15  CBC     Status: Abnormal   Collection Time: 08/26/23  3:45 AM  Result Value Ref Range   WBC 11.2 (H) 4.0 - 10.5 K/uL   RBC 2.57 (L) 4.22 - 5.81 MIL/uL   Hemoglobin 7.1 (L) 13.0 - 17.0 g/dL   HCT 77.0 (L) 60.9 - 47.9 %   MCV 89.1 80.0 - 100.0 fL   MCH 27.6 26.0 - 34.0 pg   MCHC 31.0 30.0 - 36.0 g/dL   RDW 84.0 (H) 88.4 - 84.4 %   Platelets 137 (L) 150 - 400 K/uL   nRBC 0.0 0.0 - 0.2 %  Magnesium      Status: None   Collection Time: 08/26/23  3:45 AM  Result Value Ref Range   Magnesium 1.8 1.7 - 2.4 mg/dL  Glucose, capillary     Status: Abnormal   Collection Time: 08/26/23  6:48 AM  Result Value Ref Range   Glucose-Capillary 166 (H) 70 - 99 mg/dL   Comment 1 Notify RN    Comment 2 Document in Chart      A/P R periprosthetic tibia fracture s/p ORIF  NWB RLE PT/OT Knee immobilizer with therapy for support of soft tissues (degloving injury around knee) Hgb this AM 7.1, will defer to hospitalist regarding transfusion Pain meds PRN Lovenox  VTE with aspirin  on DC Dispo: likely SNF  Joe MYRTIS Light, MD [facility] [phone] (office) [email or web address]

## 2023-08-26 NOTE — Progress Notes (Signed)
 PROGRESS NOTE    Joe Coleman  FMW:[id number] DOB: [birth year] DOA: 08/23/2023 PCP: [facility]    Brief Narrative:   78 year old with past medical history of HTN, DM2, CKD 3A, anemia of chronic disease, eczema, stomach/colon cancer, COPD, right knee arthroplasty recently admitted from 7/6 - 7/8 for right upper extremity cellulitis and acute kidney injury now presenting to the ER after right lower extremity crush injury.  Accidentally pinned between cars.  Initially hypotensive in the ER resuscitated with IV fluids.  X-ray of the right tibia-fibula showed displaced fracture, CT CAP was negative for acute pathology.  Trauma surgery and orthopedic were consulted recommending immobilization of the long-leg splint with eventual plans for surgery. Status post ORIF 7/21 by orthopedic.  Postop management including weightbearing precautions, DVT prophylaxis, pain management and wound care per orthopedic team.  Hospital course complicated by acute anemia requiring PRBC transfusion.  Getting Lokelma  for hypokalemia as well.  Assessment & Plan:  Principal Problem:   Tibia/fibula fracture Active Problems:   Type 2 diabetes mellitus (HCC)   Chronic kidney disease (CKD), stage III (moderate) (HCC)   Hypotension   Hyperkalemia     Right periprosthetic tib-fib fractures Status post ORIF 7/21 by orthopedic.  Postop management including weightbearing precautions, DVT prophylaxis, pain management and wound care per orthopedic team. -CK-normal  Acute blood loss anemia - Baseline hemoglobin 9.2, dropped down to 5.3.  Hemoglobin improved 8.7 but postop drifted down 7.1.  Will give additional unit PRBC.   Hypotension Resolved after fluid resuscitation   Mild hyperkalemia Potassium still elevated 5.7, will order EKG and Lokelma    Type 2 diabetes A1c 7.6 on 08/10/2023.  Placed on sensitive sliding scale insulin .   AKI on CKD stage IIIa Creatinine 1.97 (baseline 1.4-1.8).  Continue IV  fluid hydration and monitor renal function.  Will trend renal function   COPD Stable, no signs of acute exacerbation.  Continue home inhalers.   DVT prophylaxis: SCDs Code Status: Full Code (discussed with the patient) Family Communication:   Will need PT/OT and close monitoring of hemoglobin   Subjective: POST OP PAIN EXPECTED. No bleeding  Examination:  General exam: Appears calm and comfortable  Respiratory system: Clear to auscultation. Respiratory effort normal. Cardiovascular system: S1 & S2 heard, RRR. No JVD, murmurs, rubs, gallops or clicks. No pedal edema. Gastrointestinal system: Abdomen is nondistended, soft and nontender. No organomegaly or masses felt. Normal bowel sounds heard. Central nervous system: Alert and oriented. No focal neurological deficits. Extremities: Symmetric 5 x 5 power. Skin: Surgical dressing noted Psychiatry: Judgement and insight appear normal. Mood & affect appropriate.                Diet Orders (From admission, onward)     Start     Ordered   08/26/23 0106  Diet Carb Modified Fluid consistency: Thin; Room service appropriate? Yes  Diet effective now       Question Answer Comment  Calorie Level Medium 1600-2000   Fluid consistency: Thin   Room service appropriate? Yes      08/26/23 0105            Objective: Vitals:   08/26/23 0317 08/26/23 0744 08/26/23 0758 08/26/23 1122  BP: (!) 113/56  (!) 114/51 (!) 103/59  Pulse: 90  96 (!) 101  Resp: 17  16 14   Temp: 99 F (37.2 C)  98.9 F (37.2 C) 98.5 F (36.9 C)  TempSrc: Oral  Oral Oral  SpO2: 98% 100% 97% 94%  Weight:      Height:        Intake/Output Summary (Last 24 hours) at 08/26/2023 1223 Last data filed at 08/26/2023 0806 Gross per 24 hour  Intake 1511.24 ml  Output 1025 ml  Net 486.24 ml   Filed Weights   08/23/23 2027  Weight: 68 kg    Scheduled Meds:  sodium chloride    Intravenous Once   acetaminophen   650 mg Oral Q6H   Or   acetaminophen    650 mg Rectal Q6H   budesonide -glycopyrrolate -formoterol   2 puff Inhalation BID   docusate sodium   100 mg Oral BID   [START ON 08/27/2023] enoxaparin  (LOVENOX ) injection  30 mg Subcutaneous Q24H   gabapentin   300 mg Oral QHS   insulin  aspart  0-5 Units Subcutaneous QHS   insulin  aspart  0-9 Units Subcutaneous TID WC   sodium zirconium cyclosilicate   10 g Oral TID   Continuous Infusions:   ceFAZolin  (ANCEF ) IV Stopped (08/26/23 0202)    Nutritional status     Body mass index is 22.48 kg/m.  Data Reviewed:   CBC: Recent Labs  Lab 08/23/23 2027 08/24/23 0924 08/24/23 1112 08/25/23 0420 08/26/23 0345  WBC 11.9* 10.5 10.9* 11.3* 11.2*  HGB 9.2* 5.4* 5.3* 8.7* 7.1*  HCT 31.0* 18.1* 17.5* 27.1* 22.9*  MCV 89.9 91.9 90.7 86.9 89.1  PLT 280 168 189 157 137*   Basic Metabolic Panel: Recent Labs  Lab 08/23/23 2023 08/23/23 2027 08/24/23 0924 08/25/23 0420 08/26/23 0345  NA 137 136 136 137 135  K 5.7* 5.6* 5.4* 5.3* 5.7*  CL 109 105 108 107 106  CO2  --  19* 21* 22 23  GLUCOSE 211* 220* 180* 174* 221*  BUN 28* 29* 30* 24* 21  CREATININE 2.00* 1.97* 2.03* 1.82* 2.02*  CALCIUM  --  8.2* 7.8* 8.3* 7.9*  MG  --   --   --  1.9 1.8  PHOS  --   --   --  3.6  --    GFR: Estimated Creatinine Clearance: 28.1 mL/min (A) (by C-G formula based on SCr of 2.02 mg/dL (H)). Liver Function Tests: Recent Labs  Lab 08/23/23 2027  AST 20  ALT 10  ALKPHOS 94  BILITOT 0.5  PROT 6.0*  ALBUMIN  3.0*   No results for input(s): LIPASE, AMYLASE in the last 168 hours. No results for input(s): AMMONIA in the last 168 hours. Coagulation Profile: Recent Labs  Lab 08/23/23 2027  INR 1.0   Cardiac Enzymes: Recent Labs  Lab 08/23/23 2027  CKTOTAL 100   BNP (last 3 results) No results for input(s): PROBNP in the last 8760 hours. HbA1C: No results for input(s): HGBA1C in the last 72 hours. CBG: Recent Labs  Lab 08/25/23 1402 08/25/23 1638 08/25/23 2119 08/26/23 0648  08/26/23 1128  GLUCAP 146* 164* 166* 166* 177*   Lipid Profile: No results for input(s): CHOL, HDL, LDLCALC, TRIG, CHOLHDL, LDLDIRECT in the last 72 hours. Thyroid Function Tests: No results for input(s): TSH, T4TOTAL, FREET4, T3FREE, THYROIDAB in the last 72 hours. Anemia Panel: No results for input(s): VITAMINB12, FOLATE, FERRITIN, TIBC, IRON , RETICCTPCT in the last 72 hours. Sepsis Labs: Recent Labs  Lab 08/23/23 2029  LATICACIDVEN 1.6    Recent Results (from the past 240 hours)  Surgical PCR screen     Status: None   Collection Time: 08/24/23  8:36 PM   Specimen: Nasal Mucosa; Nasal Swab  Result Value Ref Range Status   MRSA, PCR NEGATIVE NEGATIVE Final  Staphylococcus aureus NEGATIVE NEGATIVE Final    Comment: (NOTE) The Xpert SA Assay (FDA approved for NASAL specimens in patients 73 years of age and older), is one component of a comprehensive surveillance program. It is not intended to diagnose infection nor to guide or monitor treatment. Performed at [facility], [address] [place], [place] [address]          Radiology Studies: DG Tibia/Fibula Right Port Result Date: 08/25/2023 CLINICAL DATA:  Postop. EXAM: PORTABLE RIGHT TIBIA AND FIBULA - 2 VIEW COMPARISON:  Preoperative imaging FINDINGS: Lateral plate and screw fixation of tibial fracture. Improved fracture alignment from preoperative imaging. Previous knee arthroplasty. Proximal fibular fracture is again seen. Recent postsurgical change includes air and edema in the soft tissues. IMPRESSION: ORIF of tibial fracture. No immediate postoperative complication. Electronically Signed   By: Andrea Gasman M.D.   On: 08/25/2023 15:02   DG Tibia/Fibula Right Result Date: 08/25/2023 CLINICAL DATA:  Elective surgery peer EXAM: RIGHT TIBIA AND FIBULA - 2 VIEW COMPARISON:  Preoperative radiographs FINDINGS: Nine fluoroscopic spot views of the right tibia and fibula submitted  from the operating room. Lateral plate and screw fixation of tibial fracture. Previous knee arthroplasty. Fibular fracture is faintly visualized on provided views. Fluoroscopy time 55.3 seconds. Dose 1.58 mGy. IMPRESSION: Intraoperative fluoroscopy during tibial fracture fixation. Electronically Signed   By: Andrea Gasman M.D.   On: 08/25/2023 14:35   DG C-Arm 1-60 Min-No Report Result Date: 08/25/2023 Fluoroscopy was utilized by the requesting physician.  No radiographic interpretation.           LOS: 2 days   Time spent= 35 mins    Burgess JAYSON Dare, MD [facility]  If 7PM-7AM, please contact night-coverage  08/26/2023, 12:23 PM

## 2023-08-26 NOTE — Evaluation (Signed)
 Physical Therapy Evaluation Patient Details Name: Joe Coleman MRN: [id number] DOB: Jun 21, 1945 Today's Date: 08/26/2023  History of Present Illness  Joe Coleman is an 78 y.o. male admitted 7/19 after having ground-level fall sustaining right proximal tibia periprosthetic fracture. Pt also with degloving injury of that LE. Pt underwent ORIF right LE 7/21.  PMH: COPD, diabetes  Clinical Impression  Pt admitted with above diagnosis. Pt was able to come to eOB with +2 mod assist and was unable to stand due to pt weakness and inability to follow commands effectively. Anticipate pt will not be able to maintain NWB right LE and need post acute rehab < 3 hours day unless he does have family support.  He would need 24 hour care for safety.  Will follow acutely.  Pt currently with functional limitations due to the deficits listed below (see PT Problem List). Pt will benefit from acute skilled PT to increase their independence and safety with mobility to allow discharge.           If plan is discharge home, recommend the following: A lot of help with walking and/or transfers;A lot of help with bathing/dressing/bathroom;Assistance with cooking/housework;Assist for transportation;Help with stairs or ramp for entrance   Can travel by private vehicle   No    Equipment Recommendations BSC/3in1;Wheelchair (measurements PT);Wheelchair cushion (measurements PT);Rolling walker (2 wheels);Hospital bed  Recommendations for Other Services       Functional Status Assessment Patient has had a recent decline in their functional status and demonstrates the ability to make significant improvements in function in a reasonable and predictable amount of time.     Precautions / Restrictions Precautions Precautions: Fall Recall of Precautions/Restrictions: Impaired Required Braces or Orthoses: Knee Immobilizer - Right Knee Immobilizer - Right: On at all times Restrictions Weight Bearing Restrictions Per  Provider Order: Yes RLE Weight Bearing Per Provider Order: Non weight bearing      Mobility  Bed Mobility Overal bed mobility: Needs Assistance Bed Mobility: Supine to Sit, Sit to Supine     Supine to sit: Max assist, +2 for physical assistance Sit to supine: Total assist, +2 for physical assistance   General bed mobility comments: incr time and assist to come to eOB. Pt couldnt move right LE without help.    Transfers Overall transfer level: Needs assistance   Transfers: Sit to/from Stand Sit to Stand: Total assist, +2 physical assistance           General transfer comment: Attempted sit to stand multiple times to RW and pt could not follow commands for hand placement and kept reaching up for RW.  Gave multiple cues and pt couldnt stand to RW. Was going to scoot to recliner however  pt unable due to dizziness reported with BP drop  therefore laid pt back down.    Ambulation/Gait               General Gait Details: unable  Stairs            Wheelchair Mobility     Tilt Bed    Modified Rankin (Stroke Patients Only)       Balance Overall balance assessment: Needs assistance Sitting-balance support: Single extremity supported, Bilateral upper extremity supported, Feet supported Sitting balance-Leahy Scale: Fair Sitting balance - Comments: could sit EOB but CGA to min assist for safety as pt not following commands well and moving a bit on bed while sitting making it unsafe.       Standing balance comment: unable  to stand                             Pertinent Vitals/Pain Pain Assessment Pain Assessment: Faces Faces Pain Scale: Hurts whole lot Pain Location: right LE Pain Descriptors / Indicators: Aching, Grimacing, Guarding, Discomfort Pain Intervention(s): Limited activity within patient's tolerance, Monitored during session, Repositioned, Patient requesting pain meds-RN notified    Home Living Family/patient expects to be  discharged to:: Private residence Living Arrangements: Alone Available Help at Discharge: Family;Available 24 hours/day (per chart, family to assist  but pt said noone to assist in session) Type of Home: Apartment Home Access: Stairs to enter Entrance Stairs-Rails: None Entrance Stairs-Number of Steps: 1   Home Layout: One level Home Equipment: Rollator (4 wheels);Rolling Walker (2 wheels) 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    Prior Function Prior Level of Function : Independent/Modified Independent;Driving;Working/employed             Mobility Comments: No assistive device needed       Extremity/Trunk Assessment   Upper Extremity Assessment Upper Extremity Assessment: Defer to OT evaluation    Lower Extremity Assessment Lower Extremity Assessment: RLE deficits/detail RLE: Unable to fully assess due to pain;Unable to fully assess due to immobilization    Cervical / Trunk Assessment Cervical / Trunk Assessment: Kyphotic  Communication   Communication Communication: No apparent difficulties    Cognition Arousal: Alert Behavior During Therapy: Restless, Anxious   PT - Cognitive impairments: Sequencing, Problem solving, Safety/Judgement                         Following commands: Impaired Following commands impaired: Follows one step commands inconsistently, Follows one step commands with increased time     Cueing Cueing Techniques: Verbal cues, Tactile cues, Gestural cues, Visual cues     General Comments General comments (skin integrity, edema, etc.): 114/51 intiial, BP once sitting 88/52.  Once laid back down, 90/55.  End of session 103/59.  HR 100-118 bpm with activity.    Exercises General Exercises - Lower Extremity Ankle Circles/Pumps: AROM, Both, 5 reps, Supine   Assessment/Plan    PT Assessment Patient needs continued PT services  PT Problem List Decreased strength;Decreased range of motion;Decreased activity  tolerance;Decreased balance;Decreased mobility;Decreased knowledge of use of DME;Decreased safety awareness;Decreased knowledge of precautions;Cardiopulmonary status limiting activity;Pain       PT Treatment Interventions DME instruction;Gait training;Stair training;Functional mobility training;Therapeutic activities;Therapeutic exercise;Balance training;Patient/family education    PT Goals (Current goals can be found in the Care Plan section)  Acute Rehab PT Goals Patient Stated Goal: to get better PT Goal Formulation: With patient Time For Goal Achievement: 09/09/23 Potential to Achieve Goals: Good    Frequency Min 2X/week     Co-evaluation               AM-PAC PT 6 Clicks Mobility  Outcome Measure Help needed turning from your back to your side while in a flat bed without using bedrails?: Total Help needed moving from lying on your back to sitting on the side of a flat bed without using bedrails?: Total Help needed moving to and from a bed to a chair (including a wheelchair)?: Total Help needed standing up from a chair using your arms (e.g., wheelchair or bedside chair)?: Total Help needed to walk in hospital room?: Total Help needed climbing 3-5 steps with a railing? : Total 6 Click Score: 6  End of Session Equipment Utilized During Treatment: Gait belt;Right knee immobilizer Activity Tolerance: Patient limited by fatigue;Patient limited by pain Patient left: in bed;with call bell/phone within reach;with bed alarm set;with nursing/sitter in room Nurse Communication: Mobility status;Need for lift equipment PT Visit Diagnosis: Muscle weakness (generalized) (M62.81);Other abnormalities of gait and mobility (R26.89)    Time: 8943-8874 PT Time Calculation (min) (ACUTE ONLY): 29 min   Charges:   PT Evaluation $PT Eval Moderate Complexity: 1 Mod PT Treatments $Therapeutic Activity: 8-22 mins PT General Charges $$ ACUTE PT VISIT: 1 Visit         Diva Lemberger  M,PT Acute Rehab Services [phone]   Stephane JULIANNA Bevel 08/26/2023, 12:18 PM

## 2023-08-26 NOTE — Progress Notes (Signed)
 Orthopedic Tech Progress Note Patient Details:  Joe Coleman 03-Mar-1945 [id number]  Patient ID: Joe Coleman, male   DOB: [birth year], 78 y.o.   MRN: [id number] The patient doesn't meet criteria for ohf. Pt must be under 70 to get ohf. Chandra Dorn PARAS 08/26/2023, 1:26 AM

## 2023-08-26 NOTE — Progress Notes (Signed)

## 2023-08-27 DIAGNOSIS — E1169 Type 2 diabetes mellitus with other specified complication: Secondary | ICD-10-CM

## 2023-08-27 DIAGNOSIS — E875 Hyperkalemia: Secondary | ICD-10-CM | POA: Diagnosis not present

## 2023-08-27 DIAGNOSIS — I959 Hypotension, unspecified: Secondary | ICD-10-CM

## 2023-08-27 DIAGNOSIS — N1832 Chronic kidney disease, stage 3b: Secondary | ICD-10-CM | POA: Diagnosis not present

## 2023-08-27 DIAGNOSIS — S82201A Unspecified fracture of shaft of right tibia, initial encounter for closed fracture: Secondary | ICD-10-CM | POA: Diagnosis not present

## 2023-08-27 LAB — CBC
HCT: 29.3 % — ABNORMAL LOW (ref 39.0–52.0)
Hemoglobin: 9.5 g/dL — ABNORMAL LOW (ref 13.0–17.0)
MCH: 29.5 pg (ref 26.0–34.0)
MCHC: 32.4 g/dL (ref 30.0–36.0)
MCV: 91 fL (ref 80.0–100.0)
Platelets: 141 K/uL — ABNORMAL LOW (ref 150–400)
RBC: 3.22 MIL/uL — ABNORMAL LOW (ref 4.22–5.81)
RDW: 15.4 % (ref 11.5–15.5)
WBC: 12.4 K/uL — ABNORMAL HIGH (ref 4.0–10.5)
nRBC: 0 % (ref 0.0–0.2)

## 2023-08-27 LAB — BPAM RBC
Blood Product Expiration Date: 202508162359
Blood Product Expiration Date: 202508162359
Blood Product Expiration Date: 202508162359
ISSUE DATE / TIME: 202507202012
ISSUE DATE / TIME: 202507202333
ISSUE DATE / TIME: 202507221445
Unit Type and Rh: 5100
Unit Type and Rh: 5100
Unit Type and Rh: 5100

## 2023-08-27 LAB — TYPE AND SCREEN
ABO/RH(D): O POS
Antibody Screen: NEGATIVE
Unit division: 0
Unit division: 0
Unit division: 0

## 2023-08-27 LAB — BASIC METABOLIC PANEL WITH GFR
Anion gap: 9 (ref 5–15)
BUN: 27 mg/dL — ABNORMAL HIGH (ref 8–23)
CO2: 20 mmol/L — ABNORMAL LOW (ref 22–32)
Calcium: 8.1 mg/dL — ABNORMAL LOW (ref 8.9–10.3)
Chloride: 103 mmol/L (ref 98–111)
Creatinine, Ser: 2.48 mg/dL — ABNORMAL HIGH (ref 0.61–1.24)
GFR, Estimated: 26 mL/min — ABNORMAL LOW (ref >60)
Glucose, Bld: 190 mg/dL — ABNORMAL HIGH (ref 70–99)
Potassium: 5.7 mmol/L — ABNORMAL HIGH (ref 3.5–5.1)
Sodium: 132 mmol/L — ABNORMAL LOW (ref 135–145)

## 2023-08-27 LAB — MAGNESIUM: Magnesium: 1.9 mg/dL (ref 1.7–2.4)

## 2023-08-27 LAB — GLUCOSE, CAPILLARY
Glucose-Capillary: 198 mg/dL — ABNORMAL HIGH (ref 70–99)
Glucose-Capillary: 125 mg/dL — ABNORMAL HIGH (ref 70–99)
Glucose-Capillary: 139 mg/dL — ABNORMAL HIGH (ref 70–99)
Glucose-Capillary: 206 mg/dL — ABNORMAL HIGH (ref 70–99)

## 2023-08-27 MED ORDER — METHOCARBAMOL 500 MG PO TABS: 500.0000 mg | ORAL_TABLET | Freq: Four times a day (QID) | ORAL | Status: DC | PRN

## 2023-08-27 MED ORDER — ASPIRIN 325 MG PO TBEC: 325.0000 mg | DELAYED_RELEASE_TABLET | Freq: Every day | ORAL | 0 refills | Status: DC

## 2023-08-27 MED ORDER — OXYCODONE HCL 5 MG PO TABS: 2.5000 mg | ORAL_TABLET | ORAL | 0 refills | Status: DC | PRN

## 2023-08-27 MED ORDER — FENTANYL CITRATE PF 50 MCG/ML IJ SOSY
12.5000 ug | PREFILLED_SYRINGE | INTRAMUSCULAR | Status: DC | PRN
  Administered 2023-08-27 – 2023-08-28 (×2): 12.5 ug via INTRAVENOUS
  Filled 2023-08-27 (×2): qty 1

## 2023-08-27 MED ORDER — HYDROMORPHONE HCL 1 MG/ML IJ SOLN
2.0000 mg | INTRAMUSCULAR | Status: AC
  Administered 2023-08-27: 2 mg via INTRAVENOUS
  Filled 2023-08-27: qty 2

## 2023-08-27 MED ORDER — ACETAMINOPHEN 500 MG PO TABS
1000.0000 mg | ORAL_TABLET | Freq: Four times a day (QID) | ORAL | Status: DC
  Administered 2023-08-27 – 2023-09-01 (×17): 1000 mg via ORAL
  Filled 2023-08-27 (×18): qty 2

## 2023-08-27 MED ORDER — OXYCODONE HCL 5 MG PO TABS: 5.0000 mg | ORAL_TABLET | ORAL | Status: DC | PRN

## 2023-08-27 MED ORDER — METHOCARBAMOL 500 MG PO TABS: 500.0000 mg | ORAL_TABLET | Freq: Four times a day (QID) | ORAL | 0 refills | Status: DC | PRN

## 2023-08-27 MED ORDER — SODIUM CHLORIDE 0.9 % IV SOLN: INTRAVENOUS | Status: DC

## 2023-08-27 MED ORDER — OXYCODONE HCL 5 MG PO TABS
2.5000 mg | ORAL_TABLET | ORAL | Status: DC | PRN
  Administered 2023-08-27 – 2023-09-01 (×16): 5 mg via ORAL
  Filled 2023-08-27 (×16): qty 1

## 2023-08-27 MED ORDER — VITAMIN D3 25 MCG PO TABS: 1000.0000 [IU] | ORAL_TABLET | Freq: Every day | ORAL | Status: AC

## 2023-08-27 MED ORDER — ACETAMINOPHEN 650 MG RE SUPP: 650.0000 mg | Freq: Four times a day (QID) | RECTAL | Status: DC

## 2023-08-27 MED ORDER — MORPHINE SULFATE (PF) 2 MG/ML IV SOLN: 1.0000 mg | INTRAVENOUS | Status: DC | PRN

## 2023-08-27 MED ORDER — VITAMIN D 25 MCG (1000 UNIT) PO TABS
1000.0000 [IU] | ORAL_TABLET | Freq: Every day | ORAL | Status: DC
  Administered 2023-08-27 – 2023-09-01 (×6): 1000 [IU] via ORAL
  Filled 2023-08-27 (×6): qty 1

## 2023-08-27 MED ORDER — ASPIRIN 325 MG PO TBEC: 325.0000 mg | DELAYED_RELEASE_TABLET | Freq: Every day | ORAL | 0 refills | Status: AC

## 2023-08-27 MED ORDER — SODIUM ZIRCONIUM CYCLOSILICATE 10 G PO PACK
10.0000 g | PACK | Freq: Three times a day (TID) | ORAL | Status: AC
  Administered 2023-08-27 (×3): 10 g via ORAL
  Filled 2023-08-27 (×3): qty 1

## 2023-08-27 MED ORDER — ORAL CARE MOUTH RINSE
15.0000 mL | OROMUCOSAL | Status: DC | PRN
Start: 2023-08-27 — End: 2023-09-01

## 2023-08-27 NOTE — Progress Notes (Signed)
 Orthopaedic Trauma Progress Note  SUBJECTIVE: Doing ok. Notes pain in the right leg. Per nursing, patient appears to be a little more confused since receiving morphine  earlier this morning. He doesn't remember working with therapies yesterday, but per chart reviews looks like he was able to get to EOB. Patient denies chest pain, SOB, nausea/vomiting. No other complaints. No family at bedside currently  OBJECTIVE:  Vitals:   08/27/23 0743 08/27/23 0749  BP: (!) 101/55   Pulse: (!) 101   Resp: 20   Temp:    SpO2:  98%    Opiates Today (MME): Today's  total administered Morphine  Milligram Equivalents: 88 Opiates Yesterday (MME): Yesterday's total administered Morphine  Milligram Equivalents: 51  General: Sitting up in bed, Alert to person and place. NAD Respiratory: No increased work of breathing.  Operative Extremity (RLE): KI in place. Dressing CDI. Tolerates gentle passive ankle ROM but does note calf tightness with passive dorsiflexion. Able to actively wiggle toes. Endorses sensation over the dorsal and plantar aspect off foot. + DP pulse  IMAGING: Stable post op imaging.   LABS:  Results for orders placed or performed during the hospital encounter of 08/23/23 (from the past 24 hours)  Glucose, capillary     Status: Abnormal   Collection Time: 08/26/23 11:28 AM  Result Value Ref Range   Glucose-Capillary 177 (H) 70 - 99 mg/dL  Prepare RBC (crossmatch)     Status: None   Collection Time: 08/26/23 12:30 PM  Result Value Ref Range   Order Confirmation      ORDER PROCESSED BY BLOOD BANK Performed at [facility], [address], [place], [place] [address]   Glucose, capillary     Status: Abnormal   Collection Time: 08/26/23  4:44 PM  Result Value Ref Range   Glucose-Capillary 209 (H) 70 - 99 mg/dL  Glucose, capillary     Status: Abnormal   Collection Time: 08/26/23  9:11 PM  Result Value Ref Range   Glucose-Capillary 255 (H) 70 - 99 mg/dL  Basic metabolic panel with GFR      Status: Abnormal   Collection Time: 08/27/23  6:30 AM  Result Value Ref Range   Sodium 132 (L) 135 - 145 mmol/L   Potassium 5.7 (H) 3.5 - 5.1 mmol/L   Chloride 103 98 - 111 mmol/L   CO2 20 (L) 22 - 32 mmol/L   Glucose, Bld 190 (H) 70 - 99 mg/dL   BUN 27 (H) 8 - 23 mg/dL   Creatinine, Ser 7.51 (H) 0.61 - 1.24 mg/dL   Calcium 8.1 (L) 8.9 - 10.3 mg/dL   GFR, Estimated 26 (L) >60 mL/min   Anion gap 9 5 - 15  CBC     Status: Abnormal   Collection Time: 08/27/23  6:30 AM  Result Value Ref Range   WBC 12.4 (H) 4.0 - 10.5 K/uL   RBC 3.22 (L) 4.22 - 5.81 MIL/uL   Hemoglobin 9.5 (L) 13.0 - 17.0 g/dL   HCT 70.6 (L) 60.9 - 47.9 %   MCV 91.0 80.0 - 100.0 fL   MCH 29.5 26.0 - 34.0 pg   MCHC 32.4 30.0 - 36.0 g/dL   RDW 84.5 88.4 - 84.4 %   Platelets 141 (L) 150 - 400 K/uL   nRBC 0.0 0.0 - 0.2 %  Magnesium     Status: None   Collection Time: 08/27/23  6:30 AM  Result Value Ref Range   Magnesium 1.9 1.7 - 2.4 mg/dL  Glucose, capillary  Status: Abnormal   Collection Time: 08/27/23  6:31 AM  Result Value Ref Range   Glucose-Capillary 198 (H) 70 - 99 mg/dL    ASSESSMENT: Joe Coleman is a 78 y.o. male, 2 Days Post-Op s/p ground level fall Procedures: OPEN REDUCTION INTERNAL FIXATION  RIGHT TIBIA FRACTURE  CV/Blood loss: Acute blood loss anemia, Hgb 9.5 this AM. Received 1 unit PRBCs 08/26/23.   PLAN: Weightbearing: NWB RLE ROM: ok for passive and active knee motion  Incisional and dressing care: Daily dressing changes PRN starting 08/28/23 Showering: OK to begin getting incisions wet starting 08/29/23 Orthopedic device(s): Knee immobilizer during mobilization with therapies. Can be off at all other times Pain management: Limit narcotics as able 1. Tylenol  1000 mg q 6 hours scheduled 2. Robaxin  500 mg q 6 hours PRN 3. Oxycodone  2.5 mg q 4 hours PRN 4. Fentanyl  12.5 mcg q 2 hours PRN 5. Neurontin  300 mg QHS VTE prophylaxis: Lovenox , SCDs ID:  Ancef  2gm post op Foley/Lines:  No  foley, KVO IVFs Impediments to Fracture Healing: Vit D level 28, started on supplementation Dispo: PT/OT eval as able. Will likely need SNF.    D/C recommendations: - Oxycodone  PRN, Robaxin  for pain control - ASA 325 mg daily x 30 days for DVT prophylaxis - Continue 1000 units Vit D supplementation daily x 90 days  Follow - up plan: 2 weeks after d/c for wound check and repeat x-rays   Contact information:  Franky Light MD, Lauraine Moores PA-C. After hours and holidays please check [email or web address] for group call information for [facility]   Lauraine PATRIC Moores, PA-C [phone] (office) [email or web address]

## 2023-08-27 NOTE — [place] FL2 (Signed)
 [place]  MEDICAID FL2 LEVEL OF CARE FORM     IDENTIFICATION  Patient Name: Joe Coleman Birthdate: 05/17/1945 Sex: male Admission Date (Current Location): 08/23/2023  [facility] and [place] Number:  Chiropodist and Address:  [facility], [address] [place], [place] [address]      Provider Number: [id number]  Attending Physician Name and Address:  Sherrill Alejandro Donovan, DO  Relative Name and Phone Number:       Current Level of Care: Hospital Recommended Level of Care: Skilled Nursing Facility Prior Approval Number:    Date Approved/Denied:   PASRR Number: [id number]  Discharge Plan: SNF    Current Diagnoses: Patient Active Problem List   Diagnosis Date Noted   Tibia/fibula fracture 08/24/2023   Hypotension 08/24/2023   Hyperkalemia 08/24/2023   Cellulitis of right arm 08/10/2023   Eye pain, bilateral 07/26/2022   Fall 07/26/2022   Hyponatremia 07/26/2022   Lymphopenia 07/26/2022   Shortness of breath 05/21/2022   AKI (acute kidney injury) (HCC) 03/17/2020   COVID-19 virus infection 03/17/2020   COPD with acute exacerbation (HCC) 03/17/2020   Type 2 diabetes mellitus (HCC) 03/17/2020   HTN (hypertension) 03/17/2020   B12 deficiency 10/05/2019   Erectile dysfunction 11/10/2017   Chronic kidney disease (CKD), stage III (moderate) (HCC) 09/19/2017   Mild intermittent asthma without complication 09/19/2017   Chronic cough 10/22/2016   Epigastric pain 06/30/2015   Chronic bilateral low back pain without sciatica 05/31/2015   Neuropathic pain 03/01/2015   Benign prostatic hyperplasia with lower urinary tract symptoms 04/06/2014   Adenomatous colon polyp 08/31/2013   Depression 06/21/2013   Foot pain, bilateral 05/07/2013   Insomnia 03/24/2013   Vitiligo 12/23/2012   Primary localized osteoarthrosis, lower leg 01/15/2011   Pain medication agreement broken 09/19/2010   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    Orientation RESPIRATION BLADDER Height & Weight     Self, Time, Place  O2 Continent Weight: 150 lb (68 kg) Height:  5' 8.5 (174 cm)  BEHAVIORAL SYMPTOMS/MOOD NEUROLOGICAL BOWEL NUTRITION STATUS      Continent Diet  AMBULATORY STATUS COMMUNICATION OF NEEDS Skin   Extensive Assist Verbally Surgical wounds (wound closed incision right leg)                       Personal Care Assistance Level of Assistance  Bathing, Feeding, Dressing Bathing Assistance: Limited assistance Feeding assistance: Independent Dressing Assistance: Limited assistance     Functional Limitations Info  Sight, Hearing, Speech Sight Info: Impaired Hearing Info: Impaired Speech Info: Adequate    SPECIAL CARE FACTORS FREQUENCY  PT (By licensed PT), OT (By licensed OT)     PT Frequency: 5x per week OT Frequency: 5x per week            Contractures Contractures Info: Not present    Additional Factors Info  Code Status, Allergies, Insulin  Sliding Scale Code Status Info: FULL Allergies Info: Chicken Allergy Not Specified  Nausea And Vomiting   Losartan Not Specified   Hyperkalemia (>5.5 multiple times on losartan 25mg )  Poultry Meal Not Specified  Nausea And Vomiting   Insulin  Sliding Scale Info: see discharge summary       Current Medications (08/27/2023):  This is the current hospital active medication list Current Facility-Administered Medications  Medication Dose Route Frequency Provider Last Rate Last Admin  0.9 %  sodium chloride  infusion   Intravenous Continuous Sherrill Cable [place], [place] 75 mL/hr at 08/27/23 0931 New Bag at 08/27/23 0931   acetaminophen  (TYLENOL ) tablet 1,000 mg  1,000 mg Oral Q6H Sheikh, Omair Latif, DO   1,000 mg at 08/27/23 1317   Or   acetaminophen  (TYLENOL ) suppository 650 mg  650 mg Rectal Q6H Sheikh, Omair Latif, DO       budesonide -glycopyrrolate -formoterol   (BREZTRI ) 160-9-4.8 MCG/ACT inhaler 2 puff  2 puff Inhalation BID Danton Lauraine LABOR, PA-C   2 puff at 08/27/23 0750   cholecalciferol  (VITAMIN D3) 25 MCG (1000 UNIT) tablet 1,000 Units  1,000 Units Oral Daily Danton Lauraine LABOR, PA-C   1,000 Units at 08/27/23 1317   diphenhydrAMINE  (BENADRYL ) 12.5 MG/5ML elixir 12.5-25 mg  12.5-25 mg Oral Q4H PRN Danton Lauraine LABOR, PA-C       docusate sodium  (COLACE) capsule 100 mg  100 mg Oral BID Danton Lauraine A, PA-C   100 mg at 08/27/23 0809   enoxaparin  (LOVENOX ) injection 30 mg  30 mg Subcutaneous Q24H Amin, Ankit C, MD   30 mg at 08/27/23 0809   fentaNYL  (SUBLIMAZE ) injection 12.5 mcg  12.5 mcg Intravenous Q2H PRN Sheikh, Omair Latif, DO       gabapentin  (NEURONTIN ) capsule 300 mg  300 mg Oral QHS Danton Lauraine A, PA-C   300 mg at 08/26/23 2005   guaiFENesin -dextromethorphan  (ROBITUSSIN DM) 100-10 MG/5ML syrup 5 mL  5 mL Oral Q4H PRN Amin, Ankit C, MD   5 mL at 08/26/23 1959   hydrALAZINE  (APRESOLINE ) injection 10 mg  10 mg Intravenous Q4H PRN Danton Lauraine LABOR, PA-C       insulin  aspart (novoLOG ) injection 0-5 Units  0-5 Units Subcutaneous QHS Danton Lauraine LABOR, PA-C   3 Units at 08/26/23 2131   insulin  aspart (novoLOG ) injection 0-9 Units  0-9 Units Subcutaneous TID WC Danton Lauraine LABOR, PA-C   1 Units at 08/27/23 1221   ipratropium-albuterol  (DUONEB) 0.5-2.5 (3) MG/3ML nebulizer solution 3 mL  3 mL Nebulization Q4H PRN Danton Lauraine LABOR, PA-C   3 mL at 08/24/23 1043   methocarbamol  (ROBAXIN ) tablet 500 mg  500 mg Oral Q6H PRN Danton Lauraine LABOR, PA-C   500 mg at 08/27/23 9788   Or   methocarbamol  (ROBAXIN ) injection 500 mg  500 mg Intravenous Q6H PRN Danton Lauraine LABOR, PA-C       metoCLOPramide  (REGLAN ) tablet 5-10 mg  5-10 mg Oral Q8H PRN Danton Lauraine LABOR, PA-C       Or   metoCLOPramide  (REGLAN ) injection 5-10 mg  5-10 mg Intravenous Q8H PRN Danton Lauraine LABOR, PA-C       metoprolol  tartrate (LOPRESSOR ) injection 5 mg  5 mg Intravenous Q4H PRN Danton Lauraine LABOR,  PA-C   5 mg at 08/25/23 2330   naloxone  (NARCAN ) injection 0.4 mg  0.4 mg Intravenous PRN Danton Lauraine LABOR, PA-C       ondansetron  (ZOFRAN ) injection 4 mg  4 mg Intravenous Q6H PRN Danton Lauraine LABOR, PA-C   4 mg at 08/25/23 1330   Oral care mouth rinse  15 mL Mouth Rinse PRN Amin, Ankit C, MD       oxyCODONE  (Oxy IR/ROXICODONE ) immediate release tablet 2.5-5 mg  2.5-5 mg Oral Q4H PRN Sheikh, Omair Latif, DO   5 mg at 08/27/23 1317   polyethylene glycol (MIRALAX  / GLYCOLAX ) packet 17 g  17 g Oral Daily PRN Danton Lauraine A, PA-C       sodium  chloride flush (NS) 0.9 % injection 10-40 mL  10-40 mL Intracatheter Q12H Amin, Ankit C, MD   10 mL at 08/27/23 0809   sodium chloride  flush (NS) 0.9 % injection 10-40 mL  10-40 mL Intracatheter PRN Amin, Ankit C, MD       sodium zirconium cyclosilicate  (LOKELMA ) packet 10 g  10 g Oral TID Sheikh, Omair Latif, DO   10 g at 08/27/23 1221     Discharge Medications: Please see discharge summary for a list of discharge medications.  Relevant Imaging Results:  Relevant Lab Results:   Additional Information SSN [id number]  Joe LOISE Louder, LCSW

## 2023-08-27 NOTE — Discharge Instructions (Signed)
 [facility] Discharge Instructions   General Discharge Instructions  WEIGHT BEARING STATUS:Non-weightbearing right leg. Please have knee immobilizer one when mobilizing out of bed  RANGE OF MOTION/ACTIVITY: ok for knee motion as tolerated  Wound Care: You may remove your surgical dressing. Incisions can be left open to air if there is no drainage. Once the incision is completely dry and without drainage, it may be left open to air out.  Showering may begin 08/29/23 if there is no drainage from incision.  Clean incision gently with soap and water .  DVT/PE prophylaxis: Aspirin  325 mg daily x 30 days  Diet: as you were eating previously.  Can use over the counter stool softeners and bowel preparations, such as Miralax , to help with bowel movements.  Narcotics can be constipating.  Be sure to drink plenty of fluids  PAIN MEDICATION USE AND EXPECTATIONS  You have likely been given narcotic medications to help control your pain.  After a traumatic event that results in an fracture (broken bone) with or without surgery, it is ok to use narcotic pain medications to help control one's pain.  We understand that everyone responds to pain differently and each individual patient will be evaluated on a regular basis for the continued need for narcotic medications. Ideally, narcotic medication use should last no more than 6-8 weeks (coinciding with fracture healing).   As a patient it is your responsibility as well to monitor narcotic medication use and report the amount and frequency you use these medications when you come to your office visit.   We would also advise that if you are using narcotic medications, you should take a dose prior to therapy to maximize you participation.  IF YOU ARE ON NARCOTIC MEDICATIONS IT IS NOT PERMISSIBLE TO OPERATE A MOTOR VEHICLE (MOTORCYCLE/CAR/TRUCK/MOPED) OR HEAVY MACHINERY DO NOT MIX NARCOTICS WITH OTHER CNS (CENTRAL NERVOUS SYSTEM) DEPRESSANTS SUCH AS  ALCOHOL  POST-OPERATIVE OPIOID TAPER INSTRUCTIONS: It is important to wean off of your opioid medication as soon as possible. If you do not need pain medication after your surgery it is ok to stop day one. Opioids include: Codeine , Hydrocodone(Norco, Vicodin), Oxycodone (Percocet, oxycontin ) and hydromorphone  amongst others.  Long term and even short term use of opiods can cause: Increased pain response Dependence Constipation Depression Respiratory depression And more.  Withdrawal symptoms can include Flu like symptoms Nausea, vomiting And more Techniques to manage these symptoms Hydrate well Eat regular healthy meals Stay active Use relaxation techniques(deep breathing, meditating, yoga) Do Not substitute Alcohol to help with tapering If you have been on opioids for less than two weeks and do not have pain than it is ok to stop all together.  Plan to wean off of opioids This plan should start within one week post op of your fracture surgery  Maintain the same interval or time between taking each dose and first decrease the dose.  Cut the total daily intake of opioids by one tablet each day Next start to increase the time between doses. The last dose that should be eliminated is the evening dose.    STOP SMOKING OR USING NICOTINE PRODUCTS!!!!  As discussed nicotine severely impairs your body's ability to heal surgical and traumatic wounds but also impairs bone healing.  Wounds and bone heal by forming microscopic blood vessels (angiogenesis) and nicotine is a vasoconstrictor (essentially, shrinks blood vessels).  Therefore, if vasoconstriction occurs to these microscopic blood vessels they essentially disappear and are unable to deliver necessary nutrients to the healing tissue.  This is one modifiable factor that you can do to dramatically increase your chances of healing your injury.  (This means no smoking, no nicotine gum, patches, etc)  DO NOT USE NONSTEROIDAL  ANTI-INFLAMMATORY DRUGS (NSAID'S)  Using products such as Advil  (ibuprofen ), Aleve (naproxen), Motrin  (ibuprofen ) for additional pain control during fracture healing can delay and/or prevent the healing response.  If you would like to take over the counter (OTC) medication, Tylenol  (acetaminophen ) is ok.  However, some narcotic medications that are given for pain control contain acetaminophen  as well. Therefore, you should not exceed more than 4000 mg of tylenol  in a day if you do not have liver disease.  Also note that there are may OTC medicines, such as cold medicines and allergy medicines that my contain tylenol  as well.  If you have any questions about medications and/or interactions please ask your doctor/PA or your pharmacist.      ICE AND ELEVATE INJURED/OPERATIVE EXTREMITY  Using ice and elevating the injured extremity above your heart can help with swelling and pain control.  Icing in a pulsatile fashion, such as 20 minutes on and 20 minutes off, can be followed.    Do not place ice directly on skin. Make sure there is a barrier between to skin and the ice pack.    Using frozen items such as frozen peas works well as the conform nicely to the are that needs to be iced.  USE AN ACE WRAP OR TED HOSE FOR SWELLING CONTROL  In addition to icing and elevation, Ace wraps or TED hose are used to help limit and resolve swelling.  It is recommended to use Ace wraps or TED hose until you are informed to stop.    When using Ace Wraps start the wrapping distally (farthest away from the body) and wrap proximally (closer to the body)   Example: If you had surgery on your leg or thing and you do not have a splint on, start the ace wrap at the toes and work your way up to the thigh        If you had surgery on your upper extremity and do not have a splint on, start the ace wrap at your fingers and work your way up to the upper arm   CALL THE OFFICE FOR MEDICATION REFILLS OR WITH ANY QUESTIONS/CONCERNS:  [phone]   VISIT OUR WEBSITE FOR ADDITIONAL INFORMATION: [email or web address]  Discharge Wound Care Instructions  Do NOT apply any ointments, solutions or lotions to pin sites or surgical wounds.  These prevent needed drainage and even though solutions like hydrogen peroxide kill bacteria, they also damage cells lining the pin sites that help fight infection.  Applying lotions or ointments can keep the wounds moist and can cause them to breakdown and open up as well. This can increase the risk for infection. When in doubt call the office.  Surgical incisions should be dressed daily.  If any drainage is noted, use one layer of adaptic or Mepitel, then gauze, Kerlix, and an ace wrap. - These dressing supplies should be available at local medical supply stores ([facility], [facility], etc) as well as Insurance claims handler ([facility], [facility], [place], etc)  Once the incision is completely dry and without drainage, it may be left open to air out.  Showering may begin 36-48 hours later.  Cleaning gently with soap and water .   Call office for the following: Temperature greater than 101F Persistent nausea and vomiting Severe uncontrolled pain Redness, tenderness, or signs  of infection (pain, swelling, redness, odor or green/yellow discharge around the site) Difficulty breathing, headache or visual disturbances Hives Persistent dizziness or light-headedness Extreme fatigue Any other questions or concerns you may have after discharge  In an emergency, call 911 or go to an Emergency Department at a nearby hospital  OTHER HELPFUL INFORMATION  If you had a block, it will wear off between 8-24 hrs postop typically.  This is period when your pain may go from nearly zero to the pain you would have had postop without the block.  This is an abrupt transition but nothing dangerous is happening.  You may take an extra dose of narcotic when this happens.  You should wean off your narcotic medicines as  soon as you are able.  Most patients will be off or using minimal narcotics before their first postop appointment.   We suggest you use the pain medication the first night prior to going to bed, in order to ease any pain when the anesthesia wears off. You should avoid taking pain medications on an empty stomach as it will make you nauseous.  Do not drink alcoholic beverages or take illicit drugs when taking pain medications.  In most states it is against the law to drive while you are in a splint or sling.  And certainly against the law to drive while taking narcotics.  You may return to work/school in the next couple of days when you feel up to it.   Pain medication may make you constipated.  Below are a few solutions to try in this order: Decrease the amount of pain medication if you aren't having pain. Drink lots of decaffeinated fluids. Drink prune juice and/or each dried prunes  If the first 3 don't work start with additional solutions Take Colace - an over-the-counter stool softener Take Senokot - an over-the-counter laxative Take Miralax  - a stronger over-the-counter laxative

## 2023-08-27 NOTE — Progress Notes (Signed)
 Mobility Specialist Progress Note;   08/27/23 1521  Mobility  Activity Transferred from chair to bed  Level of Assistance +2 (takes two people)  Assistive Device MaxiMove  RLE Weight Bearing Per Provider Order NWB  Activity Response Tolerated fair  Mobility visit 1 Mobility  Mobility Specialist Start Time (ACUTE ONLY) 1521  Mobility Specialist Stop Time (ACUTE ONLY) 1533  Mobility Specialist Time Calculation (min) (ACUTE ONLY) 12 min   Nts requesting assistance transferring pt back to bed, pt agreeable. Required MaxA +3 to safely transfer pt from chair to bed via [facility]. VSS throughout. Pt left comfortably in bed and left with all needs met, call bell in reach. NT in room.   Lauraine Erm Mobility Specialist Please contact via SecureChat or [employer] [phone]

## 2023-08-27 NOTE — Progress Notes (Signed)
 Physical Therapy Treatment Patient Details Name: Joe Coleman MRN: [id number] DOB: 27-Jul-1945 Today's Date: 08/27/2023   History of Present Illness Joe Coleman is an 78 y.o. male admitted 7/19 after having ground-level fall sustaining right proximal tibia periprosthetic fracture. Pt also with degloving injury of that LE. Pt underwent ORIF right LE 7/21.  PMH: COPD, diabetes.    PT Comments  Pt received in supine, lethargic and hypoxic with SpO2 reading 84% on RA when therapists arrived, improved alertness and SpO2 once pt returned to 2-3L/min O2 [place], RN notified/aware. Pt A&O x3 but tangential and needs frequent redirection to task/situation. Pt with improved tolerance for transfer training, able to perform lateral scoot from elevated bed to drop arm recliner with up to +2 modA and mod to maxA to keep RLE elevated to prevent accidental RLE weightbearing, pt guarding due to RLE pain with activity but cooperative as able. Kept KI on in chair for pt safety due to mild confusion/possible mild delirium. Room blinds opened to promote alertness/increase sunlight in room to awaken to proper day/night cycles. Patient will benefit from continued inpatient follow up therapy, <3 hours/day.    If plan is discharge home, recommend the following: A lot of help with bathing/dressing/bathroom;Assistance with cooking/housework;Assist for transportation;Help with stairs or ramp for entrance;Two people to help with walking and/or transfers;Supervision due to cognitive status   Can travel by private vehicle     No  Equipment Recommendations  BSC/3in1;Wheelchair (measurements PT);Wheelchair cushion (measurements PT);Rolling walker (2 wheels);Hospital bed (at current level would need hoyer lift; consider slide board also pending progress)    Recommendations for Other Services       Precautions / Restrictions Precautions Precautions: Fall Recall of Precautions/Restrictions: Impaired Required Braces or  Orthoses: Knee Immobilizer - Right Knee Immobilizer - Right: On when out of bed or walking;Other (comment) (Knee immobilizer during mobilization with therapies. Can be off at all other times per ortho PA note 7/23) Restrictions Weight Bearing Restrictions Per Provider Order: Yes RLE Weight Bearing Per Provider Order: Non weight bearing     Mobility  Bed Mobility Overal bed mobility: Needs Assistance Bed Mobility: Supine to Sit     Supine to sit: Mod assist, +2 for physical assistance     General bed mobility comments: MaxA to TotalA for R LE mgmt, pt assisting with UEs and L LE. cueing to sequence, increased time to initiate.    Transfers Overall transfer level: Needs assistance Equipment used: None Transfers: Bed to chair/wheelchair/BSC            Lateral/Scoot Transfers: Mod assist, +2 physical assistance, From elevated surface General transfer comment: from elevated EOB, towards L side into drop arm recliner. Full support to R LE to prevent accidental weight bearing, cueing for sequencing and assist to boost in order to scoot towards L side.    Ambulation/Gait               General Gait Details: unable   Stairs             Wheelchair Mobility     Tilt Bed    Modified Rankin (Stroke Patients Only)       Balance Overall balance assessment: Needs assistance Sitting-balance support: Single extremity supported, Feet supported, No upper extremity supported (x1 foot supported) Sitting balance-Joe Coleman Scale: Poor Sitting balance - Comments: initially Poor progressing to Fair but only with UE support; when not using BUE support, needs trunk support Postural control: Posterior lean     Standing balance  comment: unable to stand on single leg today                            Communication Communication Communication: Other (comment) Factors Affecting Communication: Reduced clarity of speech (at times)  Cognition Arousal: Alert Behavior  During Therapy: WFL for tasks assessed/performed   PT - Cognitive impairments: No family/caregiver present to determine baseline, Attention, Initiation, Sequencing, Problem solving, Safety/Judgement                       PT - Cognition Comments: Pt able to state name, correct month/year and location, however with poor recall of therapist names and roles, and often tangential, needing redirection to task. Pt does not seem to recall fully his RLE WB restrictions and needs constant assist to keep RLE elevated while transferring to prevent accidental WB on RLE Following commands: Impaired Following commands impaired: Follows one step commands with increased time, Follows multi-step commands inconsistently    Cueing Cueing Techniques: Verbal cues, Tactile cues, Visual cues, Gestural cues  Exercises      General Comments General comments (skin integrity, edema, etc.): vitals monitored; on RA upon entry with SpO2 84% and pt droswy/lethargic, RN present and increased to 3L/min O2 [place]. Poor waveform throughout but on 2L at end of session with SpO2 >90%. Pt BP soft after upright in recliner: EOB 130/74, recliner 107/68 and sustained sitting in recliner 106/63, but pt remains alert and oriented despite softer BP. He reports some lightheadedness upon sitting in chair but not significant and has hoyer lift pad under him in chair for safety with return transfer, RN notified to use hoyer to get him back to bed later for pt/staff safety.      Pertinent Vitals/Pain Pain Assessment Pain Assessment: Faces Faces Pain Scale: Hurts little more Pain Location: right LE with AAROM/repositioning Pain Descriptors / Indicators: Aching, Grimacing, Guarding, Discomfort Pain Intervention(s): Limited activity within patient's tolerance, Monitored during session, Repositioned, Premedicated before session, Ice applied (ice over R ankle once up in chair)    Home Living Family/patient expects to be discharged to::  Private residence Living Arrangements: Alone Available Help at Discharge: Family Type of Home: Apartment Home Access: Stairs to enter Entrance Stairs-Rails: None Entrance Stairs-Number of Steps: 1   Home Layout: One level Home Equipment: Rollator (4 wheels);Rolling Walker (2 wheels) 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 PT Goal Formulation: With patient Time For Goal Achievement: 09/09/23 Progress towards PT goals: Progressing toward goals    Frequency    Min 2X/week      PT Plan      Co-evaluation PT/OT/SLP Co-Evaluation/Treatment: Yes Reason for Co-Treatment: For patient/therapist safety;To address functional/ADL transfers PT goals addressed during session: Mobility/safety with mobility;Balance OT goals addressed during session: ADL's and self-care      AM-PAC PT 6 Clicks Mobility   Outcome Measure  Help needed turning from your back to your side while in a flat bed without using bedrails?: A Lot Help needed moving from lying on your back to sitting on the side of a flat bed without using bedrails?: Total Help needed moving to and from a bed to a chair (including a wheelchair)?: Total Help  needed standing up from a chair using your arms (e.g., wheelchair or bedside chair)?: Total Help needed to walk in hospital room?: Total Help needed climbing 3-5 steps with a railing? : Total 6 Click Score: 7    End of Session Equipment Utilized During Treatment: Gait belt;Right knee immobilizer;Oxygen Activity Tolerance: Patient tolerated treatment well;Other (comment);Patient limited by fatigue;Treatment limited secondary to medical complications (Comment) (needs O2 increased and soft BP with prolonged sitting, RN notified/aware) Patient left: in chair;with call bell/phone within reach;with  chair alarm set;Other (comment) (hoyer lift pad behind him in chair) Nurse Communication: Mobility status;Need for lift equipment;Other (comment) (hypoxia on RA) PT Visit Diagnosis: Muscle weakness (generalized) (M62.81);Other abnormalities of gait and mobility (R26.89)     Time: 8885-8854 PT Time Calculation (min) (ACUTE ONLY): 31 min  Charges:    $Therapeutic Activity: 8-22 mins PT General Charges $$ ACUTE PT VISIT: 1 Visit                     Juliett Eastburn P., PTA Acute Rehabilitation Services Secure Chat Preferred 9a-5:30pm Office: [phone]    Connell HERO [facility] 08/27/2023, 3:11 PM
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
## 2023-08-27 NOTE — TOC Progression Note (Signed)
 Transition of Care [facility]) - Progression Note    Patient Details  Name: Joe Coleman MRN: [id number] Date of Birth: [birth year]  Transition [facility] CM/SW Contact  Montie LOISE Louder, [place] Phone Number: 08/27/2023, 4:05 PM  Clinical Narrative:     CSW met with patient at bedside w/family. CSW introduced self and explained role. CSW discuss with patient and his cousin Ray, recommendation for short term rehab at [facility]. Patient lives home alone. Patient and family agrees with recommendations. Patient is agreeable to rehab at [facility]. No preferred SNF at this time.   TOC will provide bed offers once available.   Montie Louder, MSW, LCSW Clinical Social Worker    Expected Discharge Plan: Skilled Nursing Facility Barriers to Discharge: Insurance Authorization, Continued Medical Work up, SNF Pending bed offer               Expected Discharge Plan and Services In-house Referral: Clinical Social Work     Living arrangements for the past 2 months: Apartment                                       Social Drivers of Health (SDOH) Interventions SDOH Screenings   Food Insecurity: Food Insecurity Present (05/02/2021)   Received from [facility]  Housing: Unknown (03/04/2023)   Received [facility]  Transportation Needs: No Transportation Needs (04/09/2021)   Received from [facility]  Financial Resource Strain: Medium Risk (05/02/2021)   Received from [facility]  Physical Activity: Inactive (08/25/2018)   Received from [facility]  Social Connections: Socially Isolated (08/25/2018)   Received from [facility]  Stress: No Stress Concern Present (08/25/2018)   Received from [facility]  Tobacco Use: Medium Risk (08/25/2023)  Health Literacy: High Risk (05/04/2022)   Received from [facility]    Readmission Risk Interventions     No data to display

## 2023-08-27 NOTE — Care Plan (Signed)
 Around 0100 found pt sleeping and Sp02 in the 80s while on room air. Tried to place nasal cannula on pt and he adamantly refused saying I don't use that, I don't need it. Will continue to monitor pt and oxygen saturation.

## 2023-08-27 NOTE — Evaluation (Signed)
 Occupational Therapy Evaluation Patient Details Name: Joe Coleman MRN: [id number] DOB: 11/21/1945 Today's Date: 08/27/2023   History of Present Illness   Joe Coleman is an 78 y.o. male admitted 7/19 after having ground-level fall sustaining right proximal tibia periprosthetic fracture. Pt also with degloving injury of that LE. Pt underwent ORIF right LE 7/21.  PMH: COPD, diabetes     Clinical Impressions PTA patient independent and driving. Admitted for above and presents with problem list below.  Patient currently requires +2 mod assist for bed mobility and lateral scoot transfers into recliner, setup to total assist +2 for ADLs. Full support of R LE throughout session.  Cognitively requires cueing for sequencing and problem solving, attention and recall. SpO2 on 2-3 L during session, noted on RA at 84% upon entry-- RN aware. BP soft in recliner.  Based on performance today, believe patient will best benefit from continued OT services acutely and after dc at inpatient setting with <3hrs/day to optimize independence, safety with ADLs and mobility.   BP: 130/74 EOB  107/68 recliner  106/63 recliner      If plan is discharge home, recommend the following:   Two people to help with walking and/or transfers;Two people to help with bathing/dressing/bathroom;Supervision due to cognitive status;Direct supervision/assist for medications management;Direct supervision/assist for financial management;Assistance with cooking/housework;Assist for transportation     Functional Status Assessment   Patient has had a recent decline in their functional status and demonstrates the ability to make significant improvements in function in a reasonable and predictable amount of time.     Equipment Recommendations   Other (comment) (defer)     Recommendations for Other Services         Precautions/Restrictions   Precautions Precautions: Fall Recall of Precautions/Restrictions:  Impaired Required Braces or Orthoses: Knee Immobilizer - Right Knee Immobilizer - Right: On at all times Restrictions Weight Bearing Restrictions Per Provider Order: Yes RLE Weight Bearing Per Provider Order: Non weight bearing     Mobility Bed Mobility Overal bed mobility: Needs Assistance Bed Mobility: Supine to Sit     Supine to sit: Mod assist, +2 for physical assistance     General bed mobility comments: total assist for R LE mgmt, pt assisting with UEs and R LE. cueing to sequence    Transfers Overall transfer level: Needs assistance   Transfers: Bed to chair/wheelchair/BSC            Lateral/Scoot Transfers: Mod assist, +2 physical assistance, From elevated surface General transfer comment: from elevated EOB, towards L side into drop arm recliner. full support to R LE, cueign for sequencing and assist to boost in order to scoot towards L side.      Balance Overall balance assessment: Needs assistance Sitting-balance support: Single extremity supported, Feet supported Sitting balance-Leahy Scale: Fair Sitting balance - Comments: contact guard to min assist for safety, cueing to stay upright with tendency to lean posteriorly Postural control: Posterior lean                                 ADL either performed or assessed with clinical judgement   ADL Overall ADL's : Needs assistance/impaired     Grooming: Set up;Sitting Grooming Details (indicate cue type and reason): sitting supported         Upper Body Dressing : Minimal assistance;Sitting   Lower Body Dressing: Total assistance;+2 for physical assistance;Sitting/lateral leans;Bed level   Toilet Transfer: Moderate  assistance;+2 for physical assistance Toilet Transfer Details (indicate cue type and reason): lateral scoot towards L side         Functional mobility during ADLs: Moderate assistance;+2 for physical assistance;+2 for safety/equipment;Cueing for sequencing;Cueing for  safety       Vision   Vision Assessment?: No apparent visual deficits     Perception         Praxis         Pertinent Vitals/Pain Pain Assessment Pain Assessment: Faces Faces Pain Scale: Hurts even more Pain Location: right LE Pain Descriptors / Indicators: Aching, Grimacing, Guarding, Discomfort Pain Intervention(s): Limited activity within patient's tolerance, Monitored during session, Repositioned     Extremity/Trunk Assessment Upper Extremity Assessment Upper Extremity Assessment: Generalized weakness   Lower Extremity Assessment Lower Extremity Assessment: Defer to PT evaluation   Cervical / Trunk Assessment Cervical / Trunk Assessment: Kyphotic   Communication Communication Communication: No apparent difficulties   Cognition Arousal: Alert Behavior During Therapy: WFL for tasks assessed/performed Cognition: Cognition impaired     Awareness: Online awareness impaired Memory impairment (select all impairments): Short-term memory, Working memory Attention impairment (select first level of impairment): Sustained attention Executive functioning impairment (select all impairments): Organization, Sequencing, Problem solving OT - Cognition Comments: pt oriented and following simple commands.  He does require frequent cueing to sustain attention and is very repetitive.                 Following commands: Impaired Following commands impaired: Follows one step commands with increased time, Follows multi-step commands inconsistently     Cueing  General Comments   Cueing Techniques: Verbal cues;Tactile cues;Visual cues  vitals montiored. on RA upon entry with Spo2 84%, RN present and increased to 3L.  Poor waveform throughout but on 2L at end of session with SPo2 >90%.  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      Prior Functioning/Environment Prior Level of Function : Independent/Modified Independent;Driving;Working/employed             Mobility Comments: No assistive device needed ADLs Comments: ind    OT Problem List: Decreased strength;Decreased activity tolerance;Impaired balance (sitting and/or standing);Pain;Cardiopulmonary status limiting activity;Decreased Joe of precautions;Decreased Joe of use of DME or AE;Decreased safety awareness;Decreased cognition   OT Treatment/Interventions: Self-care/ADL training;Therapeutic exercise;DME and/or AE instruction;Therapeutic activities;Patient/family education;Balance training;Cognitive remediation/compensation      OT Goals(Current goals can be found in the care plan section)   Acute Rehab OT Goals Patient Stated Goal: get better OT Goal Formulation: With patient Time For Goal Achievement: 09/10/23 Potential to Achieve Goals: Good   OT Frequency:  Min 2X/week    Co-evaluation PT/OT/SLP Co-Evaluation/Treatment: Yes Reason for Co-Treatment: For patient/therapist safety;To address functional/ADL transfers PT goals addressed during session: Mobility/safety with mobility OT goals addressed during session: ADL's and self-care      AM-PAC OT 6 Clicks Daily Activity     Outcome Measure Help from another  person eating meals?: A Little Help from another person taking care of personal grooming?: A Little Help from another person toileting, which includes using toliet,  bedpan, or urinal?: Total Help from another person bathing (including washing, rinsing, drying)?: A Lot Help from another person to put on and taking off regular upper body clothing?: A Little Help from another person to put on and taking off regular lower body clothing?: Total 6 Click Score: 13   End of Session Equipment Utilized During Treatment: Right knee immobilizer;Gait belt;Oxygen Nurse Communication: Mobility status;Precautions  Activity Tolerance: Patient tolerated treatment well Patient left: in chair;with call bell/phone within reach;with chair alarm set  OT Visit Diagnosis: Other abnormalities of gait and mobility (R26.89);Muscle weakness (generalized) (M62.81);Pain;Other symptoms and signs involving cognitive function;History of falling (Z91.81) Pain - Right/Left: Right Pain - part of body: Leg                Time: 8886-8854 OT Time Calculation (min): 32 min Charges:  OT General Charges $OT Visit: 1 Visit OT Evaluation $OT Eval Moderate Complexity: 1 Mod  Etta NOVAK, OT Acute Rehabilitation Services Office [phone] Secure Chat Preferred    Etta GORMAN [place] 08/27/2023, 2:03 PM

## 2023-08-27 NOTE — Care Management Important Message (Signed)
 Important Message  Patient Details  Name: Joe Coleman MRN: [id number] Date of Birth: 1945-05-22   Important Message Given:  Yes - Medicare IM     Claretta Deed 08/27/2023, 3:17 PM

## 2023-08-28 ENCOUNTER — Inpatient Hospital Stay (HOSPITAL_COMMUNITY)

## 2023-08-28 DIAGNOSIS — N179 Acute kidney failure, unspecified: Secondary | ICD-10-CM

## 2023-08-28 DIAGNOSIS — E875 Hyperkalemia: Secondary | ICD-10-CM | POA: Diagnosis not present

## 2023-08-28 DIAGNOSIS — S82201A Unspecified fracture of shaft of right tibia, initial encounter for closed fracture: Secondary | ICD-10-CM | POA: Diagnosis not present

## 2023-08-28 DIAGNOSIS — N1832 Chronic kidney disease, stage 3b: Secondary | ICD-10-CM | POA: Diagnosis not present

## 2023-08-28 LAB — MAGNESIUM: Magnesium: 2 mg/dL (ref 1.7–2.4)

## 2023-08-28 LAB — COMPREHENSIVE METABOLIC PANEL WITH GFR
ALT: <5 U/L (ref 0–44)
AST: 26 U/L (ref 15–41)
Albumin: 2.2 g/dL — ABNORMAL LOW (ref 3.5–5.0)
Alkaline Phosphatase: 80 U/L (ref 38–126)
Anion gap: 10 (ref 5–15)
BUN: 33 mg/dL — ABNORMAL HIGH (ref 8–23)
CO2: 20 mmol/L — ABNORMAL LOW (ref 22–32)
Calcium: 7.8 mg/dL — ABNORMAL LOW (ref 8.9–10.3)
Chloride: 101 mmol/L (ref 98–111)
Creatinine, Ser: 2.63 mg/dL — ABNORMAL HIGH (ref 0.61–1.24)
GFR, Estimated: 24 mL/min — ABNORMAL LOW (ref >60)
Glucose, Bld: 146 mg/dL — ABNORMAL HIGH (ref 70–99)
Potassium: 4.5 mmol/L (ref 3.5–5.1)
Sodium: 131 mmol/L — ABNORMAL LOW (ref 135–145)
Total Bilirubin: 0.9 mg/dL (ref 0.0–1.2)
Total Protein: 5.2 g/dL — ABNORMAL LOW (ref 6.5–8.1)

## 2023-08-28 LAB — GLUCOSE, CAPILLARY
Glucose-Capillary: 133 mg/dL — ABNORMAL HIGH (ref 70–99)
Glucose-Capillary: 172 mg/dL — ABNORMAL HIGH (ref 70–99)
Glucose-Capillary: 124 mg/dL — ABNORMAL HIGH (ref 70–99)
Glucose-Capillary: 176 mg/dL — ABNORMAL HIGH (ref 70–99)

## 2023-08-28 LAB — URINALYSIS, COMPLETE (UACMP) WITH MICROSCOPIC
Bilirubin Urine: NEGATIVE
Glucose, UA: NEGATIVE mg/dL
Hgb urine dipstick: NEGATIVE
Ketones, ur: NEGATIVE mg/dL
Leukocytes,Ua: NEGATIVE
Nitrite: NEGATIVE
Protein, ur: NEGATIVE mg/dL
Specific Gravity, Urine: 1.015 (ref 1.005–1.030)
pH: 5 (ref 5.0–8.0)

## 2023-08-28 LAB — CBC WITH DIFFERENTIAL/PLATELET
Abs Immature Granulocytes: 0.07 K/uL (ref 0.00–0.07)
Basophils Absolute: 0 K/uL (ref 0.0–0.1)
Basophils Relative: 0 %
Eosinophils Absolute: 0.5 K/uL (ref 0.0–0.5)
Eosinophils Relative: 5 %
HCT: 24.9 % — ABNORMAL LOW (ref 39.0–52.0)
Hemoglobin: 8 g/dL — ABNORMAL LOW (ref 13.0–17.0)
Immature Granulocytes: 1 %
Lymphocytes Relative: 10 %
Lymphs Abs: 1.1 K/uL (ref 0.7–4.0)
MCH: 29.2 pg (ref 26.0–34.0)
MCHC: 32.1 g/dL (ref 30.0–36.0)
MCV: 90.9 fL (ref 80.0–100.0)
Monocytes Absolute: 0.8 K/uL (ref 0.1–1.0)
Monocytes Relative: 7 %
Neutro Abs: 8.6 K/uL — ABNORMAL HIGH (ref 1.7–7.7)
Neutrophils Relative %: 77 %
Platelets: 164 K/uL (ref 150–400)
RBC: 2.74 MIL/uL — ABNORMAL LOW (ref 4.22–5.81)
RDW: 15.3 % (ref 11.5–15.5)
WBC: 11 K/uL — ABNORMAL HIGH (ref 4.0–10.5)
nRBC: 0 % (ref 0.0–0.2)

## 2023-08-28 LAB — SODIUM, URINE, RANDOM: Sodium, Ur: 37 mmol/L

## 2023-08-28 LAB — PHOSPHORUS: Phosphorus: 4.8 mg/dL — ABNORMAL HIGH (ref 2.5–4.6)

## 2023-08-28 LAB — OSMOLALITY, URINE: Osmolality, Ur: 385 mosm/kg (ref 300–900)

## 2023-08-28 LAB — CREATININE, URINE, RANDOM: Creatinine, Urine: 110 mg/dL

## 2023-08-28 MED ORDER — SENNOSIDES-DOCUSATE SODIUM 8.6-50 MG PO TABS
1.0000 | ORAL_TABLET | Freq: Two times a day (BID) | ORAL | Status: DC
  Administered 2023-08-28 – 2023-09-01 (×7): 1 via ORAL
  Filled 2023-08-28 (×8): qty 1

## 2023-08-28 MED ORDER — BISACODYL 10 MG RE SUPP
10.0000 mg | Freq: Every day | RECTAL | Status: DC | PRN
  Administered 2023-08-29 – 2023-08-30 (×2): 10 mg via RECTAL
  Filled 2023-08-28 (×2): qty 1

## 2023-08-28 MED ORDER — POLYETHYLENE GLYCOL 3350 17 G PO PACK
17.0000 g | PACK | Freq: Two times a day (BID) | ORAL | Status: DC
  Administered 2023-08-28 – 2023-08-31 (×6): 17 g via ORAL
  Filled 2023-08-28 (×7): qty 1

## 2023-08-28 NOTE — Progress Notes (Signed)
 PROGRESS NOTE    Joe Coleman  FMW:[id number] DOB: 1945-05-16 DOA: 08/23/2023 PCP: [facility]   Brief Narrative:  The patient is an 78 year old with past medical history of HTN, DM2, CKD 3A, anemia of chronic disease, eczema, stomach/colon cancer, COPD, right knee arthroplasty recently admitted from 7/6 - 7/8 for right upper extremity cellulitis and acute kidney injury now presenting to the ER after right lower extremity crush injury.  Accidentally pinned between cars.  Initially hypotensive in the ER resuscitated with IV fluids.  X-ray of the right tibia-fibula showed displaced fracture, CT CAP was negative for acute pathology.  Trauma surgery and orthopedic were consulted recommending immobilization of the long-leg splint with eventual plans for surgery. Status post ORIF 7/21 by orthopedic.  Postop management including weightbearing precautions, DVT prophylaxis, pain management and wound care per orthopedic team.  Hospital course complicated by acute anemia requiring PRBC transfusion.  Getting Lokelma  for hypokalemia as well.  Assessment & Plan:  Principal Problem:   Tibia/fibula fracture Active Problems:   Type 2 diabetes mellitus (HCC)   Chronic kidney disease (CKD), stage III (moderate) (HCC)   Hypotension   Hyperkalemia   Right periprosthetic tib-fib fractures Status post ORIF 7/21 by orthopedic.  Postop management including weightbearing precautions, DVT prophylaxis, pain management and wound care per orthopedic team.  Will change pain management regimen given his confusion overnight.  Will schedule acetaminophen  at 1000 mg p.o. daily, change the Dilaudid  to fentanyl  and change the dose of oxycodone  to 2.5 to 5 mg -CK-normal but WBC remains elevated and current trend showing some slight improvement:  Recent Labs  Lab 08/12/23 0508 08/23/23 2027 08/24/23 0924 08/24/23 1112 08/25/23 0420 08/26/23 0345 08/27/23 0630  WBC 8.2 11.9* 10.5 10.9* 11.3* 11.2* 12.4*   -Repeat CBC in the AM Ortho recommending aspirin  325 mg daily for 30 days for DVT prophylaxis.  Also recommending vitamin D  supplementation and continuing nonweightbearing to the right lower extremity but is okay with passive and active knee motion.  Acute blood loss anemia:  Baseline hemoglobin 9.2, dropped down to 5.3.  Hemoglobin improved 8.7 but postop drifted down 7.1.  Will give additional unit PRBC; hemoglobin/hematocrit had improved now and current trend showing: Recent Labs  Lab 08/23/23 2023 08/23/23 2027 08/24/23 0924 08/24/23 1112 08/25/23 0420 08/26/23 0345 08/27/23 0630  HGB 9.9* 9.2* 5.4* 5.3* 8.7* 7.1* 9.5*  HCT 29.0* 31.0* 18.1* 17.5* 27.1* 22.9* 29.3*  MCV  --  89.9 91.9 90.7 86.9 89.1 91.0  -Continue to monitor for signs or symptoms of bleeding; no overt bleeding noted.  Repeat CBC in the a.m.   Hypotension: Resolved after fluid resuscitation. CTM BP per Protocol. Last BP reading was 112/62   Mild hyperkalemia: Improved now after Lokelma . K+ is now 4.5   Type 2 diabetes: A1c 7.6 on 08/10/2023.  Placed on sensitive sliding scale insulin .   AKI on CKD stage IIIa, worsened / Metabolic Acidosis: Creatinine 8.02 (baseline 1.4-1.8).  Continue IV fluid hydration with normal saline at 75 mL/h for now and monitor renal function. Current BUN/Cr Trend: Recent Labs  Lab 08/12/23 0508 08/23/23 2023 08/23/23 2027 08/24/23 0924 08/25/23 0420 08/26/23 0345 08/27/23 0630  BUN 29* 28* 29* 30* 24* 21 27*  CREATININE 1.74* 2.00* 1.97* 2.03* 1.82* 2.02* 2.48*  -Has a slight acidosis with a CO2 20, anion gap of 10, chloride level of 101 -Urinalysis which was relatively unremarkable but did show many bacteria, urine osmolality was 385, urine sodium was 37, urine creatinine is  110.  Renal ultrasound is still pending. -Avoid Nephrotoxic Medications, Contrast Dyes, Hypotension and Dehydration to Ensure Adequate Renal Perfusion and will need to Renally Adjust Meds -Continue to Monitor  and Trend Renal Function carefully and repeat CMP in the AM    COPD: Stable, no signs of acute exacerbation.  Continue home inhaler and Breztri   Constipation: Change Docusate 100 mg po BID to Senna-Docusate 1 tab po BID and Schedule Miralax  to 17 grams po BID. Add Bisacodyl  Suppository 10 mg RC  Hyponatremia: Mild. Na+ is now 131. CTM and Trend and repeat CMP w/in 1 week   Thrombocytopenia: Mild and resolved now.  Patient's platelet count improved from 141 yesterday is now 164.  Continue to monitor [place] no bleeding; no overt bleeding noted; repeat CBC in AM.  Poor Po Intake: Consult Nutrition  Hypoalbuminemia: Patient's Albumin  Lvl went from 3.0 -> 2.2. CTM and replete as Necessary. Repeat CMP in the AM    DVT prophylaxis: enoxaparin  (LOVENOX ) injection 30 mg Start: 08/27/23 0800 SCDs Start: 08/26/23 0106 SCDs Start: 08/24/23 0210    Code Status: Full Code Family Communication: No family present @ bedside  Disposition Plan:  Level of care: Progressive Status is: Inpatient Remains inpatient appropriate because: Needs SNF with Auth and ensure Nutrition is adequate along with stability of labs prior to D/C   Consultants:  Orthopedic surgery   Procedures:  As delineated as above  Antimicrobials:  Anti-infectives (From admission, onward)    Start     Dose/Rate Route Frequency Ordered Stop   08/26/23 0115  ceFAZolin  (ANCEF ) IVPB 2g/100 mL premix        2 g 200 mL/hr over 30 Minutes Intravenous Every 8 hours 08/26/23 0105 08/26/23 1326   08/25/23 1259  vancomycin  (VANCOCIN ) powder  Status:  Discontinued          As needed 08/25/23 1300 08/25/23 1352   08/25/23 1210  ceFAZolin  (ANCEF ) 2-4 GM/100ML-% IVPB       Note to Pharmacy: LORILEE POCK: cabinet override      08/25/23 1210 08/25/23 1220       Subjective: Seen and examined at bedside and was doing okay but complains of foot pain.  No nausea or vomiting.  Denies any lightheadedness or dizziness.  States that he slept on  and off.  Still has not had a bowel movement per his report.  Objective: Vitals:   08/28/23 0807 08/28/23 1218 08/28/23 1632 08/28/23 1954  BP:  (!) 113/97 112/62   Pulse:  90 92   Resp:  20 19 20   Temp:  98.4 F (36.9 C) 98.1 F (36.7 C)   TempSrc:  Oral Oral   SpO2: 100% 94% 98%   Weight:      Height:        Intake/Output Summary (Last 24 hours) at 08/28/2023 2003 Last data filed at 08/28/2023 1900 Gross per 24 hour  Intake 2681.49 ml  Output 1100 ml  Net 1581.49 ml   Filed Weights   08/23/23 2027  Weight: 68 kg   Examination: Physical Exam:  Constitutional: Thin elderly chronically ill-appearing Caucasian male who appears in no acute distress Respiratory: Diminished to auscultation bilaterally, no wheezing, rales, rhonchi or crackles. Normal respiratory effort and patient is not tachypenic. No accessory muscle use.  Labored breathing Cardiovascular: RRR, no murmurs / rubs / gallops. S1 and S2 auscultated. No extremity edema on the left leg Abdomen: Soft, non-tender, non-distended. Bowel sounds positive.  GU: Deferred. Musculoskeletal: No clubbing / cyanosis of digits/nails.  Right leg is immobilized and wrapped Skin: No rashes, lesions, ulcers on limited skin evaluation. No induration; Warm and dry.  Neurologic: CN 2-12 grossly intact with no focal deficits. Romberg sign cerebellar reflexes not assessed.  Psychiatric: He is awake and alert  Data Reviewed: I have personally reviewed following labs and imaging studies  CBC: Recent Labs  Lab 08/24/23 1112 08/25/23 0420 08/26/23 0345 08/27/23 0630 08/28/23 0340  WBC 10.9* 11.3* 11.2* 12.4* 11.0*  NEUTROABS  --   --   --   --  8.6*  HGB 5.3* 8.7* 7.1* 9.5* 8.0*  HCT 17.5* 27.1* 22.9* 29.3* 24.9*  MCV 90.7 86.9 89.1 91.0 90.9  PLT 189 157 137* 141* 164   Basic Metabolic Panel: Recent Labs  Lab 08/24/23 0924 08/25/23 0420 08/26/23 0345 08/27/23 0630 08/28/23 0340  NA 136 137 135 132* 131*  K 5.4* 5.3* 5.7*  5.7* 4.5  CL 108 107 106 103 101  CO2 21* 22 23 20* 20*  GLUCOSE 180* 174* 221* 190* 146*  BUN 30* 24* 21 27* 33*  CREATININE 2.03* 1.82* 2.02* 2.48* 2.63*  CALCIUM 7.8* 8.3* 7.9* 8.1* 7.8*  MG  --  1.9 1.8 1.9 2.0  PHOS  --  3.6  --   --  4.8*   GFR: Estimated Creatinine Clearance: 21.5 mL/min (A) (by C-G formula based on SCr of 2.63 mg/dL (H)). Liver Function Tests: Recent Labs  Lab 08/23/23 2027 08/28/23 0340  AST 20 26  ALT 10 <5  ALKPHOS 94 80  BILITOT 0.5 0.9  PROT 6.0* 5.2*  ALBUMIN  3.0* 2.2*   No results for input(s): LIPASE, AMYLASE in the last 168 hours. No results for input(s): AMMONIA in the last 168 hours. Coagulation Profile: Recent Labs  Lab 08/23/23 2027  INR 1.0   Cardiac Enzymes: Recent Labs  Lab 08/23/23 2027  CKTOTAL 100   BNP (last 3 results) No results for input(s): PROBNP in the last 8760 hours. HbA1C: No results for input(s): HGBA1C in the last 72 hours. CBG: Recent Labs  Lab 08/27/23 1611 08/27/23 2117 08/28/23 0624 08/28/23 1217 08/28/23 1635  GLUCAP 139* 206* 133* 172* 124*   Lipid Profile: No results for input(s): CHOL, HDL, LDLCALC, TRIG, CHOLHDL, LDLDIRECT in the last 72 hours. Thyroid Function Tests: No results for input(s): TSH, T4TOTAL, FREET4, T3FREE, THYROIDAB in the last 72 hours. Anemia Panel: No results for input(s): VITAMINB12, FOLATE, FERRITIN, TIBC, IRON , RETICCTPCT in the last 72 hours. Sepsis Labs: Recent Labs  Lab 08/23/23 2029  LATICACIDVEN 1.6   Recent Results (from the past 240 hours)  Surgical PCR screen     Status: None   Collection Time: 08/24/23  8:36 PM   Specimen: Nasal Mucosa; Nasal Swab  Result Value Ref Range Status   MRSA, PCR NEGATIVE NEGATIVE Final   Staphylococcus aureus NEGATIVE NEGATIVE Final    Comment: (NOTE) The Xpert SA Assay (FDA approved for NASAL specimens in patients 38 years of age and older), is one component of a  comprehensive surveillance program. It is not intended to diagnose infection nor to guide or monitor treatment. Performed at [facility], [address] [place], [place] [address]     Radiology Studies: US  RENAL Result Date: 08/28/2023 CLINICAL DATA:  Acute kidney injury EXAM: RENAL / URINARY TRACT ULTRASOUND COMPLETE COMPARISON:  CT chest abdomen pelvis August 23, 2023 FINDINGS: Exam is suboptimal due to overlying bowel gas. Right Kidney: Renal measurements: 10.6 x 5.1 x 5.9 cm = volume: 165.4 mL.  Cortical thinning and increased echogenicity. No nephrolithiasis, mass or hydronephrosis visualized. Left Kidney: Renal measurements: 10.2 x 5.7 x 5.1 cm = volume: 156.7 mL. Cortical thinning and increased echogenicity. No nephrolithiasis, mass or hydronephrosis visualized. Bladder: Appears normal for degree of bladder distention. Other: None. IMPRESSION: Increased parenchymal echogenicity and cortical thinning consistent with medical renal disease. No hydronephrosis. Electronically Signed   By: Megan  Zare M.D.   On: 08/28/2023 19:50   Scheduled Meds:  acetaminophen   1,000 mg Oral Q6H   Or   acetaminophen   650 mg Rectal Q6H   budesonide -glycopyrrolate -formoterol   2 puff Inhalation BID   cholecalciferol   1,000 Units Oral Daily   enoxaparin  (LOVENOX ) injection  30 mg Subcutaneous Q24H   gabapentin   300 mg Oral QHS   insulin  aspart  0-5 Units Subcutaneous QHS   insulin  aspart  0-9 Units Subcutaneous TID WC   polyethylene glycol  17 g Oral BID   senna-docusate  1 tablet Oral BID   sodium chloride  flush  10-40 mL Intracatheter Q12H   Continuous Infusions:  sodium chloride  75 mL/hr at 08/28/23 1556    LOS: 4 days   Alejandro Marker, DO [facility] Available via Epic secure chat 7am-7pm After these hours, please refer to coverage provider listed on [email or web address] 08/28/2023, 8:03 PM

## 2023-08-28 NOTE — Progress Notes (Signed)
 Physical Therapy Treatment Patient Details Name: Joe Coleman MRN: [id number] DOB: 1945-05-13 Today's Date: 08/28/2023   History of Present Illness Joe Coleman is an 78 y.o. male admitted 7/19 after having ground-level fall sustaining right proximal tibia periprosthetic fracture. Pt also with degloving injury of that LE. Pt underwent ORIF right LE 7/21.  PMH: COPD, diabetes.    PT Comments  Pt received in recliner, pt agreeable to therapy session and had not touched his lunch tray at all, with pt stating he does not eat beef or chicken, RN notified and PTA assisted him to call dietary services to order something he will eat. Pt needing up to +2 modA to perform lateral scoot from lower chair surface to slightly higher hospital bed toward his L side. Pt with improved pain tolerance in RLE and with KI donned, does not need constant support on RLE to keep it from WB. Pt with good effort as able, but very fatigued after lateral scoot from chair to bed. He was encouraged to increase PO intake (protein) to build strength/endurance and for improved wound healing, pt may need reinforcement and would benefit from Registered Dietician consult if indicated, RN/MD notified. Patient will benefit from continued inpatient follow up therapy, <3 hours/day.     If plan is discharge home, recommend the following: A lot of help with bathing/dressing/bathroom;Assistance with cooking/housework;Assist for transportation;Help with stairs or ramp for entrance;Two people to help with walking and/or transfers;Supervision due to cognitive status   Can travel by private vehicle     No  Equipment Recommendations  BSC/3in1;Wheelchair (measurements PT);Wheelchair cushion (measurements PT);Rolling walker (2 wheels);Hospital bed (at current level would need hoyer lift; consider slide board also pending progress)    Recommendations for Other Services       Precautions / Restrictions Precautions Precautions: Fall Recall  of Precautions/Restrictions: Impaired Precaution/Restrictions Comments: needs reminders for RLE ROM as tolerated and to have staff elevate RLE at rest due to edema Required Braces or Orthoses: Knee Immobilizer - Right Knee Immobilizer - Right: On when out of bed or walking;Other (comment) (Knee immobilizer during mobilization with therapies. Can be off at all other times per ortho PA note 7/23) Restrictions Weight Bearing Restrictions Per Provider Order: Yes RLE Weight Bearing Per Provider Order: Non weight bearing     Mobility  Bed Mobility Overal bed mobility: Needs Assistance Bed Mobility: Sit to Supine       Sit to supine: +2 for safety/equipment, HOB elevated, Used rails, Mod assist   General bed mobility comments: min to modA for BLE and trunk support returning to supine from EOB, KI donned until back in supine then removed, +2 for safety    Transfers Overall transfer level: Needs assistance Equipment used: None Transfers: Bed to chair/wheelchair/BSC            Lateral/Scoot Transfers: Mod assist, +2 physical assistance General transfer comment: from lower chair surface towards L side to slightly higher bed surface. With KI donned, pt able to keep RLE extended and compliant with RLE NWB; Cueing for safe UE placement, anterior lean and LLE placement. Pt tending toward more posterior lean and needs frequent redirection and some multimodal cues for better UE placement and trunk posture to prevent anterior slide while scooting back and to his L. Good effort and improved command following this date compared with previous session.    Ambulation/Gait               General Gait Details: unable   Stairs  Wheelchair Mobility     Tilt Bed    Modified Rankin (Stroke Patients Only)       Balance Overall balance assessment: Needs assistance Sitting-balance support: Feet supported, No upper extremity supported (L foot on floor, RLE extended in  KI) Sitting balance-Leahy Scale: Fair Sitting balance - Comments: upright sitting away from chair and at EOB, intermittent posterior lean with dynamic seated tasks but Fair static sitting balance with CGA to supervision Postural control: Posterior lean, Other (comment) (while scooting only today)     Standing balance comment: unable to stand on single leg today                            Communication Communication Communication: Other (comment) Factors Affecting Communication: Reduced clarity of speech (at times)  Cognition Arousal: Alert Behavior During Therapy: WFL for tasks assessed/performed   PT - Cognitive impairments: No family/caregiver present to determine baseline, Attention, Initiation, Sequencing, Problem solving, Safety/Judgement                       PT - Cognition Comments: Pt able to state name, correct month/year and location, however with poor recall of therapist names and roles, and often tangential, needing redirection to task. Pt does not seem to recall fully his RLE WB restrictions and needs constant assist to keep RLE elevated while transferring to prevent accidental WB on RLE Following commands: Impaired Following commands impaired: Follows one step commands with increased time, Follows multi-step commands inconsistently    Cueing Cueing Techniques: Verbal cues, Tactile cues, Visual cues, Gestural cues  Exercises General Exercises - Lower Extremity Ankle Circles/Pumps: AROM, Both, 5 reps, Supine Other Exercises Other Exercises: A/AAROM R toes, pt encouraged to perform as able in addition to ankle pumps for edema mgmt    General Comments General comments (skin integrity, edema, etc.): VSS on RA, HR ~106 bpm with exertion. Pt instructed on KI wear schedule and benefits of RLE elevation for edema mgmt with ankle above knee, RN also notified.      Pertinent Vitals/Pain Pain Assessment Pain Assessment: 0-10 Pain Score: 5  Pain Location:  right LE with AAROM/repositioning Pain Descriptors / Indicators: Aching, Grimacing, Guarding, Discomfort Pain Intervention(s): Limited activity within patient's tolerance, Monitored during session, Repositioned    Home Living                          Prior Function            PT Goals (current goals can now be found in the care plan section) Acute Rehab PT Goals Patient Stated Goal: To rest here until I get better. PT Goal Formulation: With patient Time For Goal Achievement: 09/09/23 Progress towards PT goals: Progressing toward goals    Frequency    Min 2X/week      PT Plan      Co-evaluation              AM-PAC PT 6 Clicks Mobility   Outcome Measure  Help needed turning from your back to your side while in a flat bed without using bedrails?: A Lot Help needed moving from lying on your back to sitting on the side of a flat bed without using bedrails?: A Lot Help needed moving to and from a bed to a chair (including a wheelchair)?: Total (+2) Help needed standing up from a chair using your arms (e.g., wheelchair or bedside  chair)?: Total Help needed to walk in hospital room?: Total Help needed climbing 3-5 steps with a railing? : Total 6 Click Score: 8    End of Session Equipment Utilized During Treatment: Gait belt;Right knee immobilizer;Oxygen (KI doffed once back in supine per ortho does not need to wear when not transferring) Activity Tolerance: Patient tolerated treatment well Patient left: with call bell/phone within reach;Other (comment);in bed;with bed alarm set (RLE elevated due to edema) Nurse Communication: Mobility status;Other (comment) (RLE edema; hoyer vs lateral scoot for OOB due to RLE NWB) PT Visit Diagnosis: Muscle weakness (generalized) (M62.81);Other abnormalities of gait and mobility (R26.89)     Time: 8573-8547 PT Time Calculation (min) (ACUTE ONLY): 26 min  Charges:    $Therapeutic Activity: 23-37 mins PT General  Charges $$ ACUTE PT VISIT: 1 Visit                     Elsi Stelzer P., PTA Acute Rehabilitation Services Secure Chat Preferred 9a-5:30pm Office: [phone]    Connell HERO [facility] 08/28/2023, 5:24 PM

## 2023-08-28 NOTE — Plan of Care (Signed)
  Problem: Health Behavior/Discharge Planning: Goal: Ability to identify and utilize available resources and services will improve Outcome: Progressing

## 2023-08-28 NOTE — Progress Notes (Signed)
 Plan of care is reviewed. Pt has been progressing. He is alert and fully oriented x 4, afebrile, stable hemodynamically, NSR on the monitor, on 2 LPM of O2 NCL, SPO2 98-99%, normal respiratory effort, no acute distress noted overnight.   Right leg pain is tolerated. We alternate pain management with Oxycodone , Robaxin , Tylenol  and Fentanyl . Pt is able to rest well. PRN Dressing with knee immobilizer is intact. We will continue to monitor.   Wendi Dash, RN

## 2023-08-28 NOTE — Progress Notes (Signed)
 PT Cancellation Note  Patient Details Name: MOXON MESSLER MRN: [id number] DOB: [birth year]   Cancelled Treatment:    Reason Eval/Treat Not Completed: (P) Patient at procedure or test/unavailable (pt off unit for renal ultrasound.) Will continue efforts per PT plan of care as schedule permits.   Connell HERO Jerik Falletta 08/28/2023, 12:58 PM

## 2023-08-28 NOTE — TOC Progression Note (Signed)
 Transition of Care [facility]) - Progression Note    Patient Details  Name: Joe Coleman MRN: [id number] Date of Birth: 03-Jul-1945  [facility]) CM/SW Contact  Montie LOISE Louder, [place] Phone Number: 08/28/2023, 2:17 PM  Clinical Narrative:     CSW met with patient- provided bed offers. CSW informed Patient will contact his cousin as well to provide offers and assist with SNF choice. Patient was agreeable.   Montie Louder, MSW, LCSW Clinical Social Worker    Expected Discharge Plan: Skilled Nursing Facility Barriers to Discharge: Insurance Authorization, Continued Medical Work up, SNF Pending bed offer               Expected Discharge Plan and Services In-house Referral: Clinical Social Work     Living arrangements for the past 2 months: Apartment                                       Social Drivers of Health (SDOH) Interventions SDOH Screenings   Food Insecurity: Food Insecurity Present (05/02/2021)   Received from [facility]  Housing: Unknown (03/04/2023)   Received from [facility]  Transportation Needs: No Transportation Needs (04/09/2021)   Received from [facility]  Financial Resource Strain: Medium Risk (05/02/2021)   Received from [facility]  Physical Activity: Inactive (08/25/2018)   Received from [facility]  Social Connections: Socially Isolated (08/25/2018)   Received from [facility]  Stress: No Stress Concern Present (08/25/2018)   Received from [facility]  Tobacco Use: Medium Risk (08/25/2023)  Health Literacy: High Risk (05/04/2022)   Received from [facility]    Readmission Risk Interventions     No data to display

## 2023-08-28 NOTE — TOC Progression Note (Addendum)
 Transition of Care [facility]) - Progression Note    Patient Details  Name: PLEZ BELTON MRN: [id number] Date of Birth: 1945-06-12  Transition of Care [facility]) CM/SW Contact  Montie LOISE Louder, [place] Phone Number: 08/28/2023, 4:23 PM  Clinical Narrative:    Spoke with patient's cousin, [place]- informed of bed offer. He chose [facility]. He states he knows the patient would want to be close to his home.   Contacted [facility] - they confirmed availability Sent request to CMA to start authorization.   Montie Louder, MSW, LCSW Clinical Social Worker    Expected Discharge Plan: Skilled Nursing Facility Barriers to Discharge: Insurance Authorization, Continued Medical Work up, SNF Pending bed offer               Expected Discharge Plan and Services In-house Referral: Clinical Social Work     Living arrangements for the past 2 months: Apartment                                       Social Drivers of Health (SDOH) Interventions SDOH Screenings   Food Insecurity: Food Insecurity Present (05/02/2021)   Received from [facility]  Housing: Unknown (03/04/2023)   Received from [facility]  Transportation Needs: No Transportation Needs (04/09/2021)   Received from [facility]  Financial Resource Strain: Medium Risk (05/02/2021)   Received from [facility]  Physical Activity: Inactive (08/25/2018)   Received from [facility]  Social Connections: Socially Isolated (08/25/2018)   Received [facility]  Stress: No Stress Concern Present (08/25/2018)   Received from [facility]  Tobacco Use: Medium Risk (08/25/2023)  Health Literacy: High Risk (05/04/2022)   Received from [facility]    Readmission Risk Interventions     No data to display

## 2023-08-28 NOTE — Progress Notes (Signed)
   08/28/23 0945  Urine Measurement/Characteristics  Urine (mL) 250 mL  Urine Color Yellow/straw  Urine Appearance Clear  Urinary Interventions Bladder scan  Bladder Scan Volume (mL) 0 mL  Hygiene Peri care    Bladder scanned of 0ml. Tube replaced  for clean catch collection. Awaiting for patient to void.

## 2023-08-28 NOTE — TOC Progression Note (Addendum)
 Transition of Care [facility]) - Progression Note    Patient Details  Name: Joe Coleman MRN: [id number] Date of Birth: 1945-05-26  Transition [facility]) CM/SW Contact  Lendia Dais, [place] Phone Number: 08/28/2023, 2:21 PM  Clinical Narrative: CSW gave the patient a list of current bed offers. With the patient's permission, CSW called patient's cousin Ray and left a message.     Expected Discharge Plan: Skilled Nursing Facility Barriers to Discharge: English as a second language teacher, Continued Medical Work up, SNF Pending bed offer               Expected Discharge Plan and Services In-house Referral: Clinical Social Work     Living arrangements for the past 2 months: Apartment                                       Social Drivers of Health (SDOH) Interventions SDOH Screenings   Food Insecurity: Food Insecurity Present (05/02/2021)   Received from [facility]  Housing: Unknown (03/04/2023)   Received from [facility]  Transportation Needs: No Transportation Needs (04/09/2021)   Received from [facility]  Financial Resource Strain: Medium Risk (05/02/2021)   Received from [facility]  Physical Activity: Inactive (08/25/2018)   Received from [facility]  Social Connections: Socially Isolated (08/25/2018)   Received from [facility]  Stress: No Stress Concern Present (08/25/2018)   Received from [facility]  Tobacco Use: Medium Risk (08/25/2023)  Health Literacy: High Risk (05/04/2022)   Received from [facility]    Readmission Risk Interventions     No data to display

## 2023-08-29 DIAGNOSIS — S82201A Unspecified fracture of shaft of right tibia, initial encounter for closed fracture: Secondary | ICD-10-CM | POA: Diagnosis not present

## 2023-08-29 DIAGNOSIS — N1832 Chronic kidney disease, stage 3b: Secondary | ICD-10-CM | POA: Diagnosis not present

## 2023-08-29 DIAGNOSIS — E875 Hyperkalemia: Secondary | ICD-10-CM | POA: Diagnosis not present

## 2023-08-29 DIAGNOSIS — N179 Acute kidney failure, unspecified: Secondary | ICD-10-CM | POA: Diagnosis not present

## 2023-08-29 LAB — COMPREHENSIVE METABOLIC PANEL WITH GFR
ALT: <5 U/L (ref 0–44)
AST: 23 U/L (ref 15–41)
Albumin: 2 g/dL — ABNORMAL LOW (ref 3.5–5.0)
Alkaline Phosphatase: 97 U/L (ref 38–126)
Anion gap: 8 (ref 5–15)
BUN: 32 mg/dL — ABNORMAL HIGH (ref 8–23)
CO2: 21 mmol/L — ABNORMAL LOW (ref 22–32)
Calcium: 7.8 mg/dL — ABNORMAL LOW (ref 8.9–10.3)
Chloride: 105 mmol/L (ref 98–111)
Creatinine, Ser: 2.17 mg/dL — ABNORMAL HIGH (ref 0.61–1.24)
GFR, Estimated: 30 mL/min — ABNORMAL LOW (ref >60)
Glucose, Bld: 172 mg/dL — ABNORMAL HIGH (ref 70–99)
Potassium: 4.3 mmol/L (ref 3.5–5.1)
Sodium: 134 mmol/L — ABNORMAL LOW (ref 135–145)
Total Bilirubin: 0.9 mg/dL (ref 0.0–1.2)
Total Protein: 5.2 g/dL — ABNORMAL LOW (ref 6.5–8.1)

## 2023-08-29 LAB — GLUCOSE, CAPILLARY
Glucose-Capillary: 157 mg/dL — ABNORMAL HIGH (ref 70–99)
Glucose-Capillary: 137 mg/dL — ABNORMAL HIGH (ref 70–99)
Glucose-Capillary: 132 mg/dL — ABNORMAL HIGH (ref 70–99)
Glucose-Capillary: 174 mg/dL — ABNORMAL HIGH (ref 70–99)

## 2023-08-29 LAB — CBC WITH DIFFERENTIAL/PLATELET
Abs Immature Granulocytes: 0.04 K/uL (ref 0.00–0.07)
Basophils Absolute: 0 K/uL (ref 0.0–0.1)
Basophils Relative: 0 %
Eosinophils Absolute: 0.4 K/uL (ref 0.0–0.5)
Eosinophils Relative: 5 %
HCT: 25 % — ABNORMAL LOW (ref 39.0–52.0)
Hemoglobin: 8 g/dL — ABNORMAL LOW (ref 13.0–17.0)
Immature Granulocytes: 0 %
Lymphocytes Relative: 8 %
Lymphs Abs: 0.7 K/uL (ref 0.7–4.0)
MCH: 29 pg (ref 26.0–34.0)
MCHC: 32 g/dL (ref 30.0–36.0)
MCV: 90.6 fL (ref 80.0–100.0)
Monocytes Absolute: 0.7 K/uL (ref 0.1–1.0)
Monocytes Relative: 8 %
Neutro Abs: 7 K/uL (ref 1.7–7.7)
Neutrophils Relative %: 79 %
Platelets: 214 K/uL (ref 150–400)
RBC: 2.76 MIL/uL — ABNORMAL LOW (ref 4.22–5.81)
RDW: 15.2 % (ref 11.5–15.5)
WBC: 9 K/uL (ref 4.0–10.5)
nRBC: 0 % (ref 0.0–0.2)

## 2023-08-29 LAB — MAGNESIUM: Magnesium: 2.1 mg/dL (ref 1.7–2.4)

## 2023-08-29 LAB — PHOSPHORUS: Phosphorus: 3.1 mg/dL (ref 2.5–4.6)

## 2023-08-29 MED ORDER — GLUCERNA SHAKE PO LIQD
237.0000 mL | Freq: Three times a day (TID) | ORAL | Status: DC
  Administered 2023-08-29 – 2023-09-01 (×8): 237 mL via ORAL

## 2023-08-29 MED ORDER — PHENYLEPHRINE HCL-NACL 20-0.9 MG/250ML-% IV SOLN
INTRAVENOUS | Status: AC
  Filled 2023-08-29: qty 250

## 2023-08-29 NOTE — Progress Notes (Signed)
 Occupational Therapy Treatment Patient Details Name: Joe Coleman MRN: [id number] DOB: 06/09/1945 Today's Date: 08/29/2023   History of present illness Joe Coleman is an 78 y.o. male admitted 7/19 after having ground-level fall sustaining right proximal tibia periprosthetic fracture. Pt also with degloving injury of that LE. Pt underwent ORIF right LE 7/21.  PMH: COPD, diabetes.   OT comments  Pt progressing towards goals. Session focused on progressing functional mobility. Noted pt with increased difficulty following simple commands, despite multimodal cues. OT verbalizing to pt to attempt to stand, pt instead began performing lateral scoot. Lateral scoot bed to chair performed with mod +2 assist. Pt reporting feeling like I am going to pass out. Elevated BP (see general comments), but HR and O2 stable, no visible signs of distress. RN notified. Pt continues to be limited by decreased cog, balance, and strength. Continue to recommend <3 hours of skilled rehab daily to optimize independence levels. Will continue to follow acutely.       If plan is discharge home, recommend the following:  Two people to help with walking and/or transfers;Two people to help with bathing/dressing/bathroom;Supervision due to cognitive status;Direct supervision/assist for medications management;Direct supervision/assist for financial management;Assistance with cooking/housework;Assist for transportation   Equipment Recommendations  Other (comment) (Defer to next venue)    Recommendations for Other Services      Precautions / Restrictions Precautions Precautions: Fall Recall of Precautions/Restrictions: Impaired Required Braces or Orthoses: Knee Immobilizer - Right Knee Immobilizer - Right: On when out of bed or walking Restrictions Weight Bearing Restrictions Per Provider Order: Yes RLE Weight Bearing Per Provider Order: Non weight bearing       Mobility Bed Mobility Overal bed mobility: Needs  Assistance Bed Mobility: Sit to Supine     Supine to sit: Min assist, HOB elevated     General bed mobility comments: Step by step cueing for sequencing, assist with RLE, pt able to use lift trunk    Transfers Overall transfer level: Needs assistance Equipment used: None Transfers: Bed to chair/wheelchair/BSC            Lateral/Scoot Transfers: Mod assist, +2 physical assistance General transfer comment: Attempted STS with RW, pt not following multimodal cues to attempt to stand, instead began to scoot. RW removed mod +2 lateral scoot     Balance Overall balance assessment: Needs assistance Sitting-balance support: Feet supported, No upper extremity supported Sitting balance-Leahy Scale: Fair       ADL either performed or assessed with clinical judgement   ADL Overall ADL's : Needs assistance/impaired     Toilet Transfer: Moderate assistance;+2 for physical assistance Toilet Transfer Details (indicate cue type and reason): lateral scoot towards L side         Functional mobility during ADLs: Moderate assistance;+2 for physical assistance;+2 for safety/equipment;Cueing for sequencing;Cueing for safety      Extremity/Trunk Assessment Upper Extremity Assessment Upper Extremity Assessment: Generalized weakness   Lower Extremity Assessment Lower Extremity Assessment: Defer to PT evaluation        Vision   Vision Assessment?: No apparent visual deficits         Communication Communication Communication: No apparent difficulties   Cognition Arousal: Alert Behavior During Therapy: WFL for tasks assessed/performed Cognition: Cognition impaired     Awareness: Online awareness impaired Memory impairment (select all impairments): Short-term memory, Working memory Attention impairment (select first level of impairment): Sustained attention Executive functioning impairment (select all impairments): Organization, Sequencing, Problem solving OT - Cognition  Comments: Can be repetitive,  not following verbal commands to sequence transfers       Following commands: Impaired Following commands impaired: Follows one step commands with increased time      Cueing   Cueing Techniques: Verbal cues, Tactile cues, Visual cues, Gestural cues        General Comments Pt reporting feeling like I am going to pass out HR and O2 stable, elevated BP 168/78 (101), placed cold wash cloth around neck, pt reporting improvement, RN notified    Pertinent Vitals/ Pain       Pain Assessment Pain Assessment: Faces Faces Pain Scale: Hurts even more Pain Location: RLE Pain Descriptors / Indicators: Aching, Grimacing, Guarding, Discomfort Pain Intervention(s): Limited activity within patient's tolerance   Frequency  Min 2X/week        Progress Toward Goals  OT Goals(current goals can now be found in the care plan section)  Progress towards OT goals: Progressing toward goals  Acute Rehab OT Goals Patient Stated Goal: To get better OT Goal Formulation: With patient Time For Goal Achievement: 09/10/23 Potential to Achieve Goals: Good ADL Goals Pt Will Perform Grooming: with set-up;sitting Pt Will Perform Lower Body Dressing: with mod assist;sitting/lateral leans;with adaptive equipment Pt Will Transfer to Toilet: with min assist;bedside commode;squat pivot transfer Additional ADL Goal #1: Pt will complete bed mobility with min assist and maintain sitting balance EOB for 5 minutes with supervision. Additional ADL Goal #2: Pt will recall and complete 3 step ADl task with supervision.  Plan         AM-PAC OT 6 Clicks Daily Activity     Outcome Measure   Help from another person eating meals?: None Help from another person taking care of personal grooming?: A Little Help from another person toileting, which includes using toliet, bedpan, or urinal?: Total Help from another person bathing (including washing, rinsing, drying)?: A Lot Help from  another person to put on and taking off regular upper body clothing?: A Little Help from another person to put on and taking off regular lower body clothing?: Total 6 Click Score: 14    End of Session Equipment Utilized During Treatment: Gait belt;Right knee immobilizer  OT Visit Diagnosis: Other abnormalities of gait and mobility (R26.89);Muscle weakness (generalized) (M62.81);Pain;Other symptoms and signs involving cognitive function;History of falling (Z91.81) Pain - Right/Left: Right Pain - part of body: Leg   Activity Tolerance Patient tolerated treatment well   Patient Left in chair;with call bell/phone within reach;with chair alarm set   Nurse Communication Mobility status;Precautions        Time: 8780-8759 OT Time Calculation (min): 21 min  Charges: OT General Charges $OT Visit: 1 Visit OT Treatments $Self Care/Home Management : 8-22 mins  Adrianne BROCKS, OT  Acute Rehabilitation Services Office [phone] Secure chat preferred   Adrianne GORMAN Savers 08/29/2023, 1:26 PM

## 2023-08-29 NOTE — TOC Progression Note (Signed)
 Transition of Care [facility]) - Progression Note    Patient Details  Name: Joe Coleman MRN: [id number] Date of Birth: [birth year]  Transition of Care [facility]) CM/SW Contact  Merari Pion A [place], LCSW Phone Number: 08/29/2023, 10:03 AM  Clinical Narrative:     Authorization pending for [facility] [place].   Auth ID: [id number]   TOC will continue to follow.   Expected Discharge Plan: Skilled Nursing Facility Barriers to Discharge: English as a second language teacher, Continued Medical Work up, SNF Pending bed offer               Expected Discharge Plan and Services In-house Referral: Clinical Social Work     Living arrangements for the past 2 months: Apartment                                       Social Drivers of Health (SDOH) Interventions SDOH Screenings   Food Insecurity: Food Insecurity Present (05/02/2021)   Received from [facility]  Housing: Unknown (03/04/2023)   Received from [facility]  Transportation Needs: No Transportation Needs (04/09/2021)   Received from [facility]  Financial Resource Strain: Medium Risk (05/02/2021)   Received from [facility]  Physical Activity: Inactive (08/25/2018)   Received from [facility]  Social Connections: Socially Isolated (08/25/2018)   Received from [facility]  Stress: No Stress Concern Present (08/25/2018)   Received from [facility]  Tobacco Use: Medium Risk (08/25/2023)  Health Literacy: High Risk (05/04/2022)   Received from [facility]    Readmission Risk Interventions     No data to display

## 2023-08-29 NOTE — TOC Progression Note (Signed)
 Transition of Care [facility]) - Progression Note    Patient Details  Name: Joe Coleman MRN: [id number] Date of Birth: 12-Aug-1945  Transition of Care [facility]) CM/SW Contact  Yitzchak Kothari A [place], LCSW Phone Number: 08/29/2023, 3:17 PM  Clinical Narrative:     Pt's authorization was approved with insurance.   Dates:  7/25 - 7/29. Mayme barrows id [id number]   plan auth id [id number]   [facility], [phone] point of contact at facility for weekend DC. Bed is available tomorrow if pt is stable for DC.  CSW notified MD, states pt is close to medically stable, EDD tomorrow.    TOC will continue to follow.       Expected Discharge Plan: Skilled Nursing Facility Barriers to Discharge: English as a second language teacher, Continued Medical Work up, SNF Pending bed offer               Expected Discharge Plan and Services In-house Referral: Clinical Social Work     Living arrangements for the past 2 months: Apartment                                       Social Drivers of Health (SDOH) Interventions SDOH Screenings   Food Insecurity: Food Insecurity Present (05/02/2021)   Received from [facility]  Housing: Unknown (03/04/2023)   Received from [facility]  Transportation Needs: No Transportation Needs (04/09/2021)   Received from [facility]  Financial Resource Strain: Medium Risk (05/02/2021)   Received from [facility]  Physical Activity: Inactive (08/25/2018)   Received from [facility]  Social Connections: Socially Isolated (08/25/2018)   Received from [facility]  Stress: No Stress Concern Present (08/25/2018)   Received from [facility]  Tobacco Use: Medium Risk (08/25/2023)  Health Literacy: High Risk (05/04/2022)   Received from [facility]    Readmission Risk Interventions     No data to display

## 2023-08-29 NOTE — Progress Notes (Signed)
 PROGRESS NOTE    Joe Coleman  FMW:[id number] DOB: Jan 15, 1946 DOA: 08/23/2023 PCP: [facility]   Brief Narrative:  The patient is an 78 year old with past medical history of HTN, DM2, CKD 3A, anemia of chronic disease, eczema, stomach/colon cancer, COPD, right knee arthroplasty recently admitted from 7/6 - 7/8 for right upper extremity cellulitis and acute kidney injury now presenting to the ER after right lower extremity crush injury.  Accidentally pinned between cars.  Initially hypotensive in the ER resuscitated with IV fluids.  X-ray of the right tibia-fibula showed displaced fracture, CT CAP was negative for acute pathology.  Trauma surgery and orthopedic were consulted recommending immobilization of the long-leg splint with eventual plans for surgery. Status post ORIF 7/21 by orthopedic.  Postop management including weightbearing precautions, DVT prophylaxis, pain management and wound care per orthopedic team.  Hospital course complicated by acute anemia requiring PRBC transfusion.  Getting Lokelma  for hypokalemia as well.  Patient seems to have improved and is now appearing to be medically stable for discharge.  Cannot discharge to SNF yet given lack of insurance authorization.  Assessment & Plan:  Principal Problem:   Tibia/fibula fracture Active Problems:   Type 2 diabetes mellitus (HCC)   Chronic kidney disease (CKD), stage III (moderate) (HCC)   Hypotension   Hyperkalemia   Right periprosthetic tib-fib fractures Status post ORIF 7/21 by orthopedic.  Postop management including weightbearing precautions, DVT prophylaxis, pain management and wound care per orthopedic team.  Will change pain management regimen given his confusion overnight.  Will schedule acetaminophen  at 1000 mg p.o. daily, change the Dilaudid  to fentanyl  and change the dose of oxycodone  to 2.5 to 5 mg -CK-normal but WBC remains elevated and current trend showing some slight improvement:  Recent  Labs  Lab 08/24/23 0924 08/24/23 1112 08/25/23 0420 08/26/23 0345 08/27/23 0630 08/28/23 0340 08/29/23 0430  WBC 10.5 10.9* 11.3* 11.2* 12.4* 11.0* 9.0  -Repeat CBC in the AM Ortho recommending aspirin  325 mg daily for 30 days for DVT prophylaxis.  Also recommending vitamin D  supplementation and continuing nonweightbearing to the right lower extremity but is okay with passive and active knee motion. Appears improved and medically stable for D/C but awaiting Insurance Authorization   Acute blood loss anemia:  Baseline hemoglobin 9.2, dropped down to 5.3.  Hemoglobin improved 8.7 but postop drifted down 7.1 so he was given an additional unit PRBC; S/p Total of 3 units of pRBCs. hemoglobin/hematocrit had improved now and current trend showing: Recent Labs  Lab 08/24/23 0924 08/24/23 1112 08/25/23 0420 08/26/23 0345 08/27/23 0630 08/28/23 0340 08/29/23 0430  HGB 5.4* 5.3* 8.7* 7.1* 9.5* 8.0* 8.0*  HCT 18.1* 17.5* 27.1* 22.9* 29.3* 24.9* 25.0*  MCV 91.9 90.7 86.9 89.1 91.0 90.9 90.6  -Continue to monitor for signs or symptoms of bleeding; no overt bleeding noted.  Repeat CBC in the a.m.   Hypotension: Resolved after fluid resuscitation. CTM BP per Protocol. Last BP reading was 137/69   Hyperkalemia: Mild and Improved now after Lokelma . K+ is now 4.3   Type 2 diabetes: A1c 7.6 on 08/10/2023.  Placed on sensitive sliding scale insulin .   AKI on CKD stage IIIa, worsened / Metabolic Acidosis: Creatinine 8.02 (baseline 1.4-1.8).  Continue IV fluid hydration with normal saline at 75 mL/h for now and monitor renal function. Current BUN/Cr Trend improving now and getting close to his Baseline: Recent Labs  Lab 08/23/23 2027 08/24/23 0924 08/25/23 0420 08/26/23 0345 08/27/23 0630 08/28/23 0340 08/29/23 0430  BUN 29* 30* 24* 21 27* 33* 32*  CREATININE 1.97* 2.03* 1.82* 2.02* 2.48* 2.63* 2.17*  -Has a slight acidosis with a CO2 21, anion gap of 8, chloride level of 105 -Urinalysis which  was relatively unremarkable but did show many bacteria, urine osmolality was 385, urine sodium was 37, urine creatinine is 110.  Renal ultrasound is still pending. -Avoid Nephrotoxic Medications, Contrast Dyes, Hypotension and Dehydration to Ensure Adequate Renal Perfusion and will need to Renally Adjust Meds -Continue to Monitor and Trend Renal Function carefully and repeat CMP in the AM    COPD: Stable, no signs of acute exacerbation.  Continue home inhaler and Breztri   Constipation: Change Docusate 100 mg po BID to Senna-Docusate 1 tab po BID and Schedule Miralax  to 17 grams po BID. Add Bisacodyl  Suppository 10 mg RC; Still has not a bowel movement yet but will try suppository and if still not improving will need an Enema   Hyponatremia: Mild. Na+ is now improved to 134. CTM and Trend and repeat CMP w/in 1 week   Thrombocytopenia: Mild and resolved now.  Patient's platelet count improved from 141 yesterday is now 164.  Continue to monitor [place] no bleeding; no overt bleeding noted; repeat CBC in AM.  Poor Po Intake: Consult Nutrition  Hypoalbuminemia: Patient's Albumin  Lvl went from 3.0 -> 2.2 -> 2.0. CTM and replete as Necessary. Repeat CMP in the AM    DVT prophylaxis: enoxaparin  (LOVENOX ) injection 30 mg Start: 08/27/23 0800 SCDs Start: 08/26/23 0106 SCDs Start: 08/24/23 0210    Code Status: Full Code Family Communication: No family currently at bedside  Disposition Plan:  Level of care: Progressive Status is: Inpatient Remains inpatient appropriate because: There is to be now medically stable for discharge however does not have insurance authorization for SNF   Consultants:  Orthopedic Surgery  Procedures:  As delineated as above   Antimicrobials:  Anti-infectives (From admission, onward)    Start     Dose/Rate Route Frequency Ordered Stop   08/26/23 0115  ceFAZolin  (ANCEF ) IVPB 2g/100 mL premix        2 g 200 mL/hr over 30 Minutes Intravenous Every 8 hours 08/26/23  0105 08/26/23 1326   08/25/23 1259  vancomycin  (VANCOCIN ) powder  Status:  Discontinued          As needed 08/25/23 1300 08/25/23 1352   08/25/23 1210  ceFAZolin  (ANCEF ) 2-4 GM/100ML-% IVPB       Note to Pharmacy: LORILEE POCK: cabinet override      08/25/23 1210 08/25/23 1220       Subjective: Seen and examined at bedside and was doing okay.  Still complain of some leg pain.  Has not had a bowel movement yet still.  No nausea or vomiting.  Denies any other concerns or complaints at this time.  Objective: Vitals:   08/28/23 2320 08/29/23 0308 08/29/23 0737 08/29/23 1244  BP: 117/65 122/85 135/81 137/69  Pulse: (!) 108 100 93 93  Resp: 20 20 20 16   Temp: 98.7 F (37.1 C) 99.1 F (37.3 C) 97.9 F (36.6 C) 97.7 F (36.5 C)  TempSrc: Oral Oral Oral Oral  SpO2: 91% 97% 94% 98%  Weight:      Height:        Intake/Output Summary (Last 24 hours) at 08/29/2023 1508 Last data filed at 08/29/2023 0600 Gross per 24 hour  Intake 1587.77 ml  Output 950 ml  Net 637.77 ml   Filed Weights   08/23/23 2027  Weight: 68  kg   Examination: Physical Exam:  Constitutional: Thin elderly chronically ill-appearing Caucasian male who is in no acute distress Respiratory: Diminished to auscultation bilaterally, no wheezing, rales, rhonchi or crackles. Normal respiratory effort and patient is not tachypenic. No accessory muscle use.  Unlabored breathing Cardiovascular: RRR, no murmurs / rubs / gallops. S1 and S2 auscultated. No extremity edema on the left and his right leg is immobilized in an Ace bandage Abdomen: Soft, non-tender, non-distended. Bowel sounds positive.  GU: Deferred. Musculoskeletal: No clubbing / cyanosis of digits/nails. No joint deformity upper and lower extremities. Skin: No rashes, lesions, ulcers on limited skin evaluation. No induration; Warm and dry.  Neurologic: CN 2-12 grossly intact with no focal deficits. Romberg sign and cerebellar reflexes not assessed.  Psychiatric:  He is awake and alert and appears calm  Data Reviewed: I have personally reviewed following labs and imaging studies  CBC: Recent Labs  Lab 08/25/23 0420 08/26/23 0345 08/27/23 0630 08/28/23 0340 08/29/23 0430  WBC 11.3* 11.2* 12.4* 11.0* 9.0  NEUTROABS  --   --   --  8.6* 7.0  HGB 8.7* 7.1* 9.5* 8.0* 8.0*  HCT 27.1* 22.9* 29.3* 24.9* 25.0*  MCV 86.9 89.1 91.0 90.9 90.6  PLT 157 137* 141* 164 214   Basic Metabolic Panel: Recent Labs  Lab 08/25/23 0420 08/26/23 0345 08/27/23 0630 08/28/23 0340 08/29/23 0430  NA 137 135 132* 131* 134*  K 5.3* 5.7* 5.7* 4.5 4.3  CL 107 106 103 101 105  CO2 22 23 20* 20* 21*  GLUCOSE 174* 221* 190* 146* 172*  BUN 24* 21 27* 33* 32*  CREATININE 1.82* 2.02* 2.48* 2.63* 2.17*  CALCIUM 8.3* 7.9* 8.1* 7.8* 7.8*  MG 1.9 1.8 1.9 2.0 2.1  PHOS 3.6  --   --  4.8* 3.1   GFR: Estimated Creatinine Clearance: 26.1 mL/min (A) (by C-G formula based on SCr of 2.17 mg/dL (H)). Liver Function Tests: Recent Labs  Lab 08/23/23 2027 08/28/23 0340 08/29/23 0430  AST 20 26 23   ALT 10 <5 <5  ALKPHOS 94 80 97  BILITOT 0.5 0.9 0.9  PROT 6.0* 5.2* 5.2*  ALBUMIN  3.0* 2.2* 2.0*   No results for input(s): LIPASE, AMYLASE in the last 168 hours. No results for input(s): AMMONIA in the last 168 hours. Coagulation Profile: Recent Labs  Lab 08/23/23 2027  INR 1.0   Cardiac Enzymes: Recent Labs  Lab 08/23/23 2027  CKTOTAL 100   BNP (last 3 results) No results for input(s): PROBNP in the last 8760 hours. HbA1C: No results for input(s): HGBA1C in the last 72 hours. CBG: Recent Labs  Lab 08/28/23 1217 08/28/23 1635 08/28/23 2113 08/29/23 0615 08/29/23 1246  GLUCAP 172* 124* 176* 157* 137*   Lipid Profile: No results for input(s): CHOL, HDL, LDLCALC, TRIG, CHOLHDL, LDLDIRECT in the last 72 hours. Thyroid Function Tests: No results for input(s): TSH, T4TOTAL, FREET4, T3FREE, THYROIDAB in the last 72  hours. Anemia Panel: No results for input(s): VITAMINB12, FOLATE, FERRITIN, TIBC, IRON , RETICCTPCT in the last 72 hours. Sepsis Labs: Recent Labs  Lab 08/23/23 2029  LATICACIDVEN 1.6   Recent Results (from the past 240 hours)  Surgical PCR screen     Status: None   Collection Time: 08/24/23  8:36 PM   Specimen: Nasal Mucosa; Nasal Swab  Result Value Ref Range Status   MRSA, PCR NEGATIVE NEGATIVE Final   Staphylococcus aureus NEGATIVE NEGATIVE Final    Comment: (NOTE) The Xpert SA Assay (FDA approved for NASAL specimens  in patients 74 years of age and older), is one component of a comprehensive surveillance program. It is not intended to diagnose infection nor to guide or monitor treatment. Performed at [facility], [address], [place], [place] [address]     Radiology Studies: US  RENAL Result Date: 08/28/2023 CLINICAL DATA:  Acute kidney injury EXAM: RENAL / URINARY TRACT ULTRASOUND COMPLETE COMPARISON:  CT chest abdomen pelvis August 23, 2023 FINDINGS: Exam is suboptimal due to overlying bowel gas. Right Kidney: Renal measurements: 10.6 x 5.1 x 5.9 cm = volume: 165.4 mL. Cortical thinning and increased echogenicity. No nephrolithiasis, mass or hydronephrosis visualized. Left Kidney: Renal measurements: 10.2 x 5.7 x 5.1 cm = volume: 156.7 mL. Cortical thinning and increased echogenicity. No nephrolithiasis, mass or hydronephrosis visualized. Bladder: Appears normal for degree of bladder distention. Other: None. IMPRESSION: Increased parenchymal echogenicity and cortical thinning consistent with medical renal disease. No hydronephrosis. Electronically Signed   By: Megan  Zare M.D.   On: 08/28/2023 19:50   Scheduled Meds:  acetaminophen   1,000 mg Oral Q6H   Or   acetaminophen   650 mg Rectal Q6H   budesonide -glycopyrrolate -formoterol   2 puff Inhalation BID   cholecalciferol   1,000 Units Oral Daily   enoxaparin  (LOVENOX ) injection  30 mg Subcutaneous Q24H   feeding  supplement (GLUCERNA SHAKE)  237 mL Oral TID BM   gabapentin   300 mg Oral QHS   insulin  aspart  0-5 Units Subcutaneous QHS   insulin  aspart  0-9 Units Subcutaneous TID WC   polyethylene glycol  17 g Oral BID   senna-docusate  1 tablet Oral BID   sodium chloride  flush  10-40 mL Intracatheter Q12H   Continuous Infusions:  sodium chloride  75 mL/hr at 08/29/23 0411    LOS: 5 days   Alejandro Marker, DO [facility] Available via Epic secure chat 7am-7pm After these hours, please refer to coverage provider listed on [email or web address] 08/29/2023, 3:08 PM

## 2023-08-29 NOTE — Progress Notes (Signed)
 Initial Nutrition Assessment  DOCUMENTATION CODES:   Non-severe (moderate) malnutrition in context of chronic illness  INTERVENTION:  Glucerna Shake po TID, each supplement provides 220 kcal and 10 grams of protein  Magic cup TID with meals, each supplement provides 290 kcal and 9 grams of protein  Encouraged adequate PO intake that will meet pt's calories and protein needs  NUTRITION DIAGNOSIS:   Moderate Malnutrition related to chronic illness as evidenced by mild fat depletion, moderate muscle depletion.  GOAL:   Patient will meet greater than or equal to 90% of their needs  MONITOR:   PO intake, Supplement acceptance  REASON FOR ASSESSMENT:   Consult Assessment of nutrition requirement/status, Poor PO  ASSESSMENT:   Pt with hx of HTN, type 2 diabetes, chronic anemia, stomach/colon cancer, COPD, CKD IIIa, cellulitis, and AKI. S/p recent R knee arthroplasty. Admitted to ER after R lower extremity crush injury, diagnosed displaced fx of tibia and fibula.  7/19 admitted 7/21 s/p ORIF  Hospital course complicated by anemia requiring transfusions and hypokalemia requiring Lokelma . Pt also experiencing poor PO.   Spoke with pt who was awake and alert. Pt reports continued poor PO intake due to not liking food options in hospital and feeling nauseous when eating. Hostess came in during assessment to take pt's meal order and pt refused most options given but ultimately agreed on a peanut butter and jelly sandwich with yogurt for lunch and dinner. Encouraged pt to try to find items he likes and ordering them to help intake. Pt agreeable to try Glucerna shake and magic cups with meals to help calorie and protein intake.  PTA, pt ate 2x per day. Pt reports eating variety of foods but could not give specific details on diet history. Pt reports he would eat fish sandwiches from [employer] but did not specify time of day he would eat. He reports it just depended on the day and his  schedule. Pt reports he cannot eat chicken which makes protein options limited for him when choosing meals. Discussed quick protein options like peanut butter and yogurt to help pt meet protein needs.  Suspect wt in chart is self-reported and may not reflect pt's current wt as he was recently weighed as 65.7kg earlier in last July admission. Pt reports no recent wt loss but nutrition focused physical exam shows mild fat depletions and moderate muscle depletions indicative of malnutrition. Pt reports he does not have any mobility issues and was ambulatory PTA. Suspect malnutrition related to chronic illness and under nutrition that does not meet pt's calorie and protein needs.  Will continue to monitor pt's intake and supplement acceptance.  Average Meal Completion: 7/20-7/24: 22.5% average intake over 8 recorded meals  Medications reviewed and include:  Vitamin D3 1000 units daily Novolog  SSI 4x daily Miralax  Senna  Labs reviewed:   CBG x 24 hours: 124-206 mg/dL J8R 7.6 Sodium 865 BUN 32/Creatinine 2.17 Hgb 8.0  NUTRITION - FOCUSED PHYSICAL EXAM:  Flowsheet Row Most Recent Value  Orbital Region Mild depletion  Upper Arm Region Moderate depletion  Thoracic and Lumbar Region No depletion  Buccal Region Mild depletion  Temple Region Moderate depletion  Clavicle Bone Region Mild depletion  Clavicle and Acromion Bone Region Mild depletion  Scapular Bone Region Moderate depletion  Dorsal Hand Moderate depletion  Patellar Region Moderate depletion  Anterior Thigh Region Moderate depletion  Posterior Calf Region Moderate depletion  Edema (RD Assessment) None  Hair Reviewed  Eyes Reviewed  Mouth Reviewed  Skin Reviewed  Nails Reviewed    Diet Order:   Diet Order             Diet Carb Modified Fluid consistency: Thin; Room service appropriate? Yes with Assist  Diet effective now                   EDUCATION NEEDS:   Education needs have been addressed  Skin:  Skin  Assessment: Skin Integrity Issues: Skin Integrity Issues:: Incisions Incisions: R leg  Last BM:  7/20  Height:   Ht Readings from Last 1 Encounters:  08/23/23 5' 8.5 (1.74 m)    Weight:   Wt Readings from Last 1 Encounters:  08/23/23 68 kg    Ideal Body Weight:  71.4 kg  BMI:  Body mass index is 22.48 kg/m.  Estimated Nutritional Needs:   Kcal:  1800-2000  Protein:  80-95g  Fluid:  1.8-2L   Josette Glance, MS, RDN, LDN Clinical Dietitian I Please reach out via secure chat

## 2023-08-29 NOTE — Progress Notes (Signed)
   08/29/23 0330  Wound 08/25/23 1342 Surgical Closed Surgical Incision Leg Right  Date First Assessed/Time First Assessed: 08/25/23 1342   Primary Wound Type: Surgical  Secondary Wound Type - Surgical: Closed Surgical Incision  Location: Leg  Location Orientation: Right  Site / Wound Assessment Clean  Drainage Description No odor  Drainage Amount Copious serous drainage.  Dressing Type Compression wrap;ABD;Gauze (Comment);Non adherent  Dressing Changed  Changed  Dressing Status Clean, Dry, Intact  Margins Attached edges (approximated, sutures)  Treatment Off loading   Wendi Dash, RN

## 2023-08-30 ENCOUNTER — Inpatient Hospital Stay (HOSPITAL_COMMUNITY)

## 2023-08-30 DIAGNOSIS — N179 Acute kidney failure, unspecified: Secondary | ICD-10-CM | POA: Diagnosis not present

## 2023-08-30 DIAGNOSIS — E875 Hyperkalemia: Secondary | ICD-10-CM | POA: Diagnosis not present

## 2023-08-30 DIAGNOSIS — E440 Moderate protein-calorie malnutrition: Secondary | ICD-10-CM | POA: Insufficient documentation

## 2023-08-30 DIAGNOSIS — S82201A Unspecified fracture of shaft of right tibia, initial encounter for closed fracture: Secondary | ICD-10-CM | POA: Diagnosis not present

## 2023-08-30 DIAGNOSIS — N1832 Chronic kidney disease, stage 3b: Secondary | ICD-10-CM | POA: Diagnosis not present

## 2023-08-30 LAB — CBC WITH DIFFERENTIAL/PLATELET
Abs Immature Granulocytes: 0.06 K/uL (ref 0.00–0.07)
Basophils Absolute: 0 K/uL (ref 0.0–0.1)
Basophils Relative: 0 %
Eosinophils Absolute: 0.2 K/uL (ref 0.0–0.5)
Eosinophils Relative: 3 %
HCT: 25.8 % — ABNORMAL LOW (ref 39.0–52.0)
Hemoglobin: 8.2 g/dL — ABNORMAL LOW (ref 13.0–17.0)
Immature Granulocytes: 1 %
Lymphocytes Relative: 8 %
Lymphs Abs: 0.7 K/uL (ref 0.7–4.0)
MCH: 28.6 pg (ref 26.0–34.0)
MCHC: 31.8 g/dL (ref 30.0–36.0)
MCV: 89.9 fL (ref 80.0–100.0)
Monocytes Absolute: 0.8 K/uL (ref 0.1–1.0)
Monocytes Relative: 10 %
Neutro Abs: 6.3 K/uL (ref 1.7–7.7)
Neutrophils Relative %: 78 %
Platelets: 253 K/uL (ref 150–400)
RBC: 2.87 MIL/uL — ABNORMAL LOW (ref 4.22–5.81)
RDW: 15.2 % (ref 11.5–15.5)
WBC: 8 K/uL (ref 4.0–10.5)
nRBC: 0 % (ref 0.0–0.2)

## 2023-08-30 LAB — COMPREHENSIVE METABOLIC PANEL WITH GFR
ALT: 5 U/L (ref 0–44)
AST: 26 U/L (ref 15–41)
Albumin: 2 g/dL — ABNORMAL LOW (ref 3.5–5.0)
Alkaline Phosphatase: 94 U/L (ref 38–126)
Anion gap: 10 (ref 5–15)
BUN: 29 mg/dL — ABNORMAL HIGH (ref 8–23)
CO2: 20 mmol/L — ABNORMAL LOW (ref 22–32)
Calcium: 8.1 mg/dL — ABNORMAL LOW (ref 8.9–10.3)
Chloride: 105 mmol/L (ref 98–111)
Creatinine, Ser: 1.81 mg/dL — ABNORMAL HIGH (ref 0.61–1.24)
GFR, Estimated: 37 mL/min — ABNORMAL LOW (ref >60)
Glucose, Bld: 132 mg/dL — ABNORMAL HIGH (ref 70–99)
Potassium: 4.2 mmol/L (ref 3.5–5.1)
Sodium: 135 mmol/L (ref 135–145)
Total Bilirubin: 0.7 mg/dL (ref 0.0–1.2)
Total Protein: 5 g/dL — ABNORMAL LOW (ref 6.5–8.1)

## 2023-08-30 LAB — MAGNESIUM: Magnesium: 1.9 mg/dL (ref 1.7–2.4)

## 2023-08-30 LAB — GLUCOSE, CAPILLARY
Glucose-Capillary: 147 mg/dL — ABNORMAL HIGH (ref 70–99)
Glucose-Capillary: 161 mg/dL — ABNORMAL HIGH (ref 70–99)
Glucose-Capillary: 186 mg/dL — ABNORMAL HIGH (ref 70–99)
Glucose-Capillary: 146 mg/dL — ABNORMAL HIGH (ref 70–99)

## 2023-08-30 LAB — PHOSPHORUS: Phosphorus: 2.8 mg/dL (ref 2.5–4.6)

## 2023-08-30 MED ORDER — FLEET ENEMA RE ENEM
1.0000 | ENEMA | Freq: Once | RECTAL | Status: AC
  Administered 2023-08-30: 1 via RECTAL
  Filled 2023-08-30: qty 1

## 2023-08-30 MED ORDER — METOCLOPRAMIDE HCL 5 MG/ML IJ SOLN
10.0000 mg | Freq: Once | INTRAMUSCULAR | Status: AC
  Administered 2023-08-30: 10 mg via INTRAVENOUS
  Filled 2023-08-30: qty 2

## 2023-08-30 MED ORDER — SODIUM BICARBONATE 650 MG PO TABS
650.0000 mg | ORAL_TABLET | Freq: Two times a day (BID) | ORAL | Status: DC
  Administered 2023-08-30 – 2023-09-01 (×5): 650 mg via ORAL
  Filled 2023-08-30 (×5): qty 1

## 2023-08-30 NOTE — Progress Notes (Signed)
 PROGRESS NOTE    Joe Coleman  FMW:[id number] DOB: 1945/10/03 DOA: 08/23/2023 PCP: [facility]   Brief Narrative:  The patient is an 78 year old with past medical history of HTN, DM2, CKD 3A, anemia of chronic disease, eczema, stomach/colon cancer, COPD, right knee arthroplasty recently admitted from 7/6 - 7/8 for right upper extremity cellulitis and acute kidney injury now presenting to the ER after right lower extremity crush injury.  Accidentally pinned between cars.  Initially hypotensive in the ER resuscitated with IV fluids.  X-ray of the right tibia-fibula showed displaced fracture, CT CAP was negative for acute pathology.  Trauma surgery and orthopedic were consulted recommending immobilization of the long-leg splint with eventual plans for surgery. Status post ORIF 7/21 by orthopedic.  Postop management including weightbearing precautions, DVT prophylaxis, pain management and wound care per orthopedic team.  Hospital course complicated by acute anemia requiring PRBC transfusion.  Getting Lokelma  for hypokalemia as well and now developed an ileus.  Will continue bowel regimen as below and if his ileus improved and he has bowel movements likely can be discharged to SNF now that authorization has been obtained.  Assessment & Plan:  Principal Problem:   Tibia/fibula fracture Active Problems:   Type 2 diabetes mellitus (HCC)   Chronic kidney disease (CKD), stage III (moderate) (HCC)   Hypotension   Hyperkalemia   Malnutrition of moderate degree   Right periprosthetic tib-fib fractures Status post ORIF 7/21 by orthopedic.  Postop management including weightbearing precautions, DVT prophylaxis, pain management and wound care per orthopedic team.  Will change pain management regimen given his confusion overnight.  Will schedule acetaminophen  at 1000 mg p.o. daily, change the Dilaudid  to fentanyl  and change the dose of oxycodone  to 2.5 to 5 mg -CK-normal but WBC remains  elevated and current trend showing some slight improvement:  Recent Labs  Lab 08/24/23 1112 08/25/23 0420 08/26/23 0345 08/27/23 0630 08/28/23 0340 08/29/23 0430 08/30/23 0219  WBC 10.9* 11.3* 11.2* 12.4* 11.0* 9.0 8.0  -Repeat CBC in the AM Ortho recommending aspirin  325 mg daily for 30 days for DVT prophylaxis.  Also recommending vitamin D  supplementation and continuing nonweightbearing to the right lower extremity but is okay with passive and active knee motion. Appears improved and medically stable for D/C but awaiting Insurance Authorization   Acute blood loss anemia:  Baseline hemoglobin 9.2, dropped down to 5.3.  Hemoglobin improved 8.7 but postop drifted down 7.1 so he was given an additional unit PRBC; S/p Total of 3 units of pRBCs. hemoglobin/hematocrit had improved now and current trend showing: Recent Labs  Lab 08/24/23 1112 08/25/23 0420 08/26/23 0345 08/27/23 0630 08/28/23 0340 08/29/23 0430 08/30/23 0219  HGB 5.3* 8.7* 7.1* 9.5* 8.0* 8.0* 8.2*  HCT 17.5* 27.1* 22.9* 29.3* 24.9* 25.0* 25.8*  MCV 90.7 86.9 89.1 91.0 90.9 90.6 89.9  -CTM for signs or symptoms of bleeding; no overt bleeding noted.  Repeat CBC in the a.m.   Hypotension: Resolved after fluid resuscitation. CTM BP per Protocol. Last BP reading was 149/79   Hyperkalemia: Mild and Improved now after Lokelma . K+ is now 4.2   Type 2 diabetes: A1c 7.6 on 08/10/2023.  Placed on sensitive sliding scale insulin .   AKI on CKD stage IIIa, worsened / Metabolic Acidosis: Creatinine 8.02 (baseline 1.4-1.8).  Continue IV fluid hydration with normal saline at 75 mL/h for now and monitor renal function. Current BUN/Cr Trend improving now and getting close to his Baseline: Recent Labs  Lab 08/24/23 0924 08/25/23 0420  08/26/23 0345 08/27/23 0630 08/28/23 0340 08/29/23 0430 08/30/23 0219  BUN 30* 24* 21 27* 33* 32* 29*  CREATININE 2.03* 1.82* 2.02* 2.48* 2.63* 2.17* 1.81*  -Has a slight acidosis with a CO2 20, anion  gap of 10, chloride level of 105 -Urinalysis which was relatively unremarkable but did show many bacteria, urine osmolality was 385, urine sodium was 37, urine creatinine is 110.  Renal ultrasound is still pending. -Avoid Nephrotoxic Medications, Contrast Dyes, Hypotension and Dehydration to Ensure Adequate Renal Perfusion and will need to Renally Adjust Meds -Continue to Monitor and Trend Renal Function carefully and repeat CMP in the AM    COPD: Stable, no signs of acute exacerbation.  Continue home inhaler and Breztri   Constipation and now ileus: Change Docusate 100 mg po BID to Senna-Docusate 1 tab po BID and Schedule Miralax  to 17 grams po BID. Add Bisacodyl  Suppository 10 mg RC; Still has not a bowel movement yet and has had Suppository x2. KUB done and showed Diffuse gaseous distention of bowel, most notable throughout the colon. Large stool burden in the right colon. Findings most suggestive of ileus. No organomegaly or free air. Bibasilar atelectasis. -Will give a Fleet Enema and CTM and also order IV Metaclopramide 10 mg x1  Hyponatremia: Mild. Na+ is now improved to 135. CTM and Trend and repeat CMP w/in 1 week   Thrombocytopenia: Mild and resolved now.  Patient's platelet count is now 253. Continue to monitor [place] no bleeding; no overt bleeding noted; repeat CBC in AM.  Poor Po Intake / Moderate Malnutrition in the Context of Chronic illness: Consulted RD. Nutrition Status: Nutrition Problem: Moderate Malnutrition Etiology: chronic illness Signs/Symptoms: mild fat depletion, moderate muscle depletion Interventions: Glucerna shake, Magic cup  Hypoalbuminemia: Patient's Albumin  Lvl went from 3.0 -> 2.2 -> 2.0 again. CTM and replete as Necessary. Repeat CMP in the AM    DVT prophylaxis: enoxaparin  (LOVENOX ) injection 30 mg Start: 08/27/23 0800 SCDs Start: 08/26/23 0106 SCDs Start: 08/24/23 0210    Code Status: Full Code Family Communication: No family currently at  bedside  Disposition Plan:  Level of care: Progressive Status is: Inpatient Remains inpatient appropriate because: Now developed an ileus and will need ileus to resolve to be discharged to SNF given that he now has insurance authorization and bed availability.   Consultants:  Orthopedic Surgery  Procedures:  As delineated as above  Antimicrobials:  Anti-infectives (From admission, onward)    Start     Dose/Rate Route Frequency Ordered Stop   08/26/23 0115  ceFAZolin  (ANCEF ) IVPB 2g/100 mL premix        2 g 200 mL/hr over 30 Minutes Intravenous Every 8 hours 08/26/23 0105 08/26/23 1326   08/25/23 1259  vancomycin  (VANCOCIN ) powder  Status:  Discontinued          As needed 08/25/23 1300 08/25/23 1352   08/25/23 1210  ceFAZolin  (ANCEF ) 2-4 GM/100ML-% IVPB       Note to Pharmacy: LORILEE POCK: cabinet override      08/25/23 1210 08/25/23 1220       Subjective: Seen and examined at bedside and was complaining some abdominal discomfort.  States that he still not had a bowel movement.  Thinks he is doing okay.  No nausea or vomiting.  Denies any other concerns or complaints at this time  Objective: Vitals:   08/30/23 0744 08/30/23 0815 08/30/23 1220 08/30/23 1500  BP:  (!) 148/81 (!) 150/80 (!) 149/79  Pulse:   94   Resp:  19   Temp:  97.8 F (36.6 C)  98.4 F (36.9 C)  TempSrc:  Oral  Oral  SpO2: 98%  100%   Weight:      Height:        Intake/Output Summary (Last 24 hours) at 08/30/2023 1854 Last data filed at 08/30/2023 1448 Gross per 24 hour  Intake 2238.27 ml  Output --  Net 2238.27 ml   Filed Weights   08/23/23 2027  Weight: 68 kg   Examination: Physical Exam:  Constitutional: Thin elderly chronically ill-appearing Caucasian male in no acute distress Respiratory: Diminished to auscultation bilaterally with some coarse breath sounds, no wheezing, rales, rhonchi or crackles. Normal respiratory effort and patient is not tachypenic. No accessory muscle use.   Unlabored breathing Cardiovascular: RRR, no murmurs / rubs / gallops. S1 and S2 auscultated.  No appreciable edema on the left his right leg is still immobilized in Ace bandage Abdomen: Soft, tender to palpate, distended secondary body habitus and has hypertympanic to percuss.. Bowel sounds are diminished.  GU: Deferred. Musculoskeletal: No clubbing / cyanosis of digits/nails.  Right leg is wrapped Skin: No rashes, lesions, ulcers on limited skin evaluation. No induration; Warm and dry.  Neurologic: CN 2-12 grossly intact with no focal deficits. Romberg sign cerebellar reflexes not assessed.  Psychiatric: Awake and alert  Data Reviewed: I have personally reviewed following labs and imaging studies  CBC: Recent Labs  Lab 08/26/23 0345 08/27/23 0630 08/28/23 0340 08/29/23 0430 08/30/23 0219  WBC 11.2* 12.4* 11.0* 9.0 8.0  NEUTROABS  --   --  8.6* 7.0 6.3  HGB 7.1* 9.5* 8.0* 8.0* 8.2*  HCT 22.9* 29.3* 24.9* 25.0* 25.8*  MCV 89.1 91.0 90.9 90.6 89.9  PLT 137* 141* 164 214 253   Basic Metabolic Panel: Recent Labs  Lab 08/25/23 0420 08/26/23 0345 08/27/23 0630 08/28/23 0340 08/29/23 0430 08/30/23 0219  NA 137 135 132* 131* 134* 135  K 5.3* 5.7* 5.7* 4.5 4.3 4.2  CL 107 106 103 101 105 105  CO2 22 23 20* 20* 21* 20*  GLUCOSE 174* 221* 190* 146* 172* 132*  BUN 24* 21 27* 33* 32* 29*  CREATININE 1.82* 2.02* 2.48* 2.63* 2.17* 1.81*  CALCIUM 8.3* 7.9* 8.1* 7.8* 7.8* 8.1*  MG 1.9 1.8 1.9 2.0 2.1 1.9  PHOS 3.6  --   --  4.8* 3.1 2.8   GFR: Estimated Creatinine Clearance: 31.3 mL/min (A) (by C-G formula based on SCr of 1.81 mg/dL (H)). Liver Function Tests: Recent Labs  Lab 08/23/23 2027 08/28/23 0340 08/29/23 0430 08/30/23 0219  AST 20 26 23 26   ALT 10 <5 <5 5  ALKPHOS 94 80 97 94  BILITOT 0.5 0.9 0.9 0.7  PROT 6.0* 5.2* 5.2* 5.0*  ALBUMIN  3.0* 2.2* 2.0* 2.0*   No results for input(s): LIPASE, AMYLASE in the last 168 hours. No results for input(s): AMMONIA in  the last 168 hours. Coagulation Profile: Recent Labs  Lab 08/23/23 2027  INR 1.0   Cardiac Enzymes: Recent Labs  Lab 08/23/23 2027  CKTOTAL 100   BNP (last 3 results) No results for input(s): PROBNP in the last 8760 hours. HbA1C: No results for input(s): HGBA1C in the last 72 hours. CBG: Recent Labs  Lab 08/29/23 1653 08/29/23 2108 08/30/23 0604 08/30/23 1218 08/30/23 1540  GLUCAP 132* 174* 147* 161* 186*   Lipid Profile: No results for input(s): CHOL, HDL, LDLCALC, TRIG, CHOLHDL, LDLDIRECT in the last 72 hours. Thyroid Function Tests: No results for input(s): TSH,  T4TOTAL, FREET4, T3FREE, THYROIDAB in the last 72 hours. Anemia Panel: No results for input(s): VITAMINB12, FOLATE, FERRITIN, TIBC, IRON , RETICCTPCT in the last 72 hours. Sepsis Labs: Recent Labs  Lab 08/23/23 2029  LATICACIDVEN 1.6   Recent Results (from the past 240 hours)  Surgical PCR screen     Status: None   Collection Time: 08/24/23  8:36 PM   Specimen: Nasal Mucosa; Nasal Swab  Result Value Ref Range Status   MRSA, PCR NEGATIVE NEGATIVE Final   Staphylococcus aureus NEGATIVE NEGATIVE Final    Comment: (NOTE) The Xpert SA Assay (FDA approved for NASAL specimens in patients 71 years of age and older), is one component of a comprehensive surveillance program. It is not intended to diagnose infection nor to guide or monitor treatment. Performed at [facility], [address] [place], [place] [address]     Radiology Studies: DG Abd 1 View Result Date: 08/30/2023 CLINICAL DATA:  Abdominal distention EXAM: ABDOMEN - 1 VIEW COMPARISON:  CT 08/23/2023 FINDINGS: Diffuse gaseous distention of bowel, most notable throughout the colon. Large stool burden in the right colon. Findings most suggestive of ileus. No organomegaly or free air. Bibasilar atelectasis. IMPRESSION: Diffuse significant gaseous distention of bowel suggesting ileus. Electronically Signed    By: Franky Crease M.D.   On: 08/30/2023 15:29   Scheduled Meds:  acetaminophen   1,000 mg Oral Q6H   Or   acetaminophen   650 mg Rectal Q6H   budesonide -glycopyrrolate -formoterol   2 puff Inhalation BID   cholecalciferol   1,000 Units Oral Daily   enoxaparin  (LOVENOX ) injection  30 mg Subcutaneous Q24H   feeding supplement (GLUCERNA SHAKE)  237 mL Oral TID BM   gabapentin   300 mg Oral QHS   insulin  aspart  0-5 Units Subcutaneous QHS   insulin  aspart  0-9 Units Subcutaneous TID WC   metoCLOPramide  (REGLAN ) injection  10 mg Intravenous Once   polyethylene glycol  17 g Oral BID   senna-docusate  1 tablet Oral BID   sodium bicarbonate   650 mg Oral BID   sodium chloride  flush  10-40 mL Intracatheter Q12H   Continuous Infusions:  sodium chloride  75 mL/hr at 08/30/23 1448    LOS: 6 days   Alejandro Marker, DO [facility] Available via Epic secure chat 7am-7pm After these hours, please refer to coverage provider listed on [email or web address] 08/30/2023, 6:54 PM

## 2023-08-31 ENCOUNTER — Inpatient Hospital Stay (HOSPITAL_COMMUNITY)

## 2023-08-31 DIAGNOSIS — N1832 Chronic kidney disease, stage 3b: Secondary | ICD-10-CM | POA: Diagnosis not present

## 2023-08-31 DIAGNOSIS — K567 Ileus, unspecified: Secondary | ICD-10-CM

## 2023-08-31 DIAGNOSIS — N179 Acute kidney failure, unspecified: Secondary | ICD-10-CM | POA: Diagnosis not present

## 2023-08-31 DIAGNOSIS — S82201A Unspecified fracture of shaft of right tibia, initial encounter for closed fracture: Secondary | ICD-10-CM | POA: Diagnosis not present

## 2023-08-31 LAB — COMPREHENSIVE METABOLIC PANEL WITH GFR
ALT: 8 U/L (ref 0–44)
AST: 32 U/L (ref 15–41)
Albumin: 2 g/dL — ABNORMAL LOW (ref 3.5–5.0)
Alkaline Phosphatase: 100 U/L (ref 38–126)
Anion gap: 7 (ref 5–15)
BUN: 25 mg/dL — ABNORMAL HIGH (ref 8–23)
CO2: 20 mmol/L — ABNORMAL LOW (ref 22–32)
Calcium: 8.3 mg/dL — ABNORMAL LOW (ref 8.9–10.3)
Chloride: 112 mmol/L — ABNORMAL HIGH (ref 98–111)
Creatinine, Ser: 1.58 mg/dL — ABNORMAL HIGH (ref 0.61–1.24)
GFR, Estimated: 44 mL/min — ABNORMAL LOW (ref >60)
Glucose, Bld: 132 mg/dL — ABNORMAL HIGH (ref 70–99)
Potassium: 4.7 mmol/L (ref 3.5–5.1)
Sodium: 139 mmol/L (ref 135–145)
Total Bilirubin: 0.8 mg/dL (ref 0.0–1.2)
Total Protein: 5.1 g/dL — ABNORMAL LOW (ref 6.5–8.1)

## 2023-08-31 LAB — CBC WITH DIFFERENTIAL/PLATELET
Abs Immature Granulocytes: 0.11 K/uL — ABNORMAL HIGH (ref 0.00–0.07)
Basophils Absolute: 0 K/uL (ref 0.0–0.1)
Basophils Relative: 0 %
Eosinophils Absolute: 0.4 K/uL (ref 0.0–0.5)
Eosinophils Relative: 4 %
HCT: 26.6 % — ABNORMAL LOW (ref 39.0–52.0)
Hemoglobin: 8.3 g/dL — ABNORMAL LOW (ref 13.0–17.0)
Immature Granulocytes: 1 %
Lymphocytes Relative: 10 %
Lymphs Abs: 0.9 K/uL (ref 0.7–4.0)
MCH: 28.5 pg (ref 26.0–34.0)
MCHC: 31.2 g/dL (ref 30.0–36.0)
MCV: 91.4 fL (ref 80.0–100.0)
Monocytes Absolute: 0.8 K/uL (ref 0.1–1.0)
Monocytes Relative: 10 %
Neutro Abs: 6.5 K/uL (ref 1.7–7.7)
Neutrophils Relative %: 75 %
Platelets: 307 K/uL (ref 150–400)
RBC: 2.91 MIL/uL — ABNORMAL LOW (ref 4.22–5.81)
RDW: 15.4 % (ref 11.5–15.5)
WBC: 8.8 K/uL (ref 4.0–10.5)
nRBC: 0 % (ref 0.0–0.2)

## 2023-08-31 LAB — PHOSPHORUS: Phosphorus: 3 mg/dL (ref 2.5–4.6)

## 2023-08-31 LAB — GLUCOSE, CAPILLARY
Glucose-Capillary: 146 mg/dL — ABNORMAL HIGH (ref 70–99)
Glucose-Capillary: 155 mg/dL — ABNORMAL HIGH (ref 70–99)
Glucose-Capillary: 128 mg/dL — ABNORMAL HIGH (ref 70–99)
Glucose-Capillary: 110 mg/dL — ABNORMAL HIGH (ref 70–99)

## 2023-08-31 LAB — MAGNESIUM: Magnesium: 1.9 mg/dL (ref 1.7–2.4)

## 2023-08-31 MED ORDER — FLEET ENEMA RE ENEM
1.0000 | ENEMA | Freq: Once | RECTAL | Status: DC
  Filled 2023-08-31: qty 1

## 2023-08-31 MED ORDER — GUAIFENESIN ER 600 MG PO TB12
1200.0000 mg | ORAL_TABLET | Freq: Two times a day (BID) | ORAL | Status: DC
  Administered 2023-08-31 – 2023-09-01 (×3): 1200 mg via ORAL
  Filled 2023-08-31 (×3): qty 2

## 2023-08-31 MED ORDER — METOCLOPRAMIDE HCL 5 MG/ML IJ SOLN
10.0000 mg | Freq: Once | INTRAMUSCULAR | Status: AC
Start: 2023-08-31 — End: 2023-08-31
  Administered 2023-08-31: 10 mg via INTRAVENOUS
  Filled 2023-08-31: qty 2

## 2023-08-31 MED ORDER — ENOXAPARIN SODIUM 40 MG/0.4ML IJ SOSY
40.0000 mg | PREFILLED_SYRINGE | INTRAMUSCULAR | Status: DC
  Administered 2023-09-01: 40 mg via SUBCUTANEOUS
  Filled 2023-08-31: qty 0.4

## 2023-08-31 NOTE — Progress Notes (Signed)
 PROGRESS NOTE    Joe Coleman  FMW:[id number] DOB: Dec 05, 1945 DOA: 08/23/2023 PCP: [facility]   Brief Narrative:  The patient is an 78 year old with past medical history of HTN, DM2, CKD 3A, anemia of chronic disease, eczema, stomach/colon cancer, COPD, right knee arthroplasty recently admitted from 7/6 - 7/8 for right upper extremity cellulitis and acute kidney injury now presenting to the ER after right lower extremity crush injury.  Accidentally pinned between cars.  Initially hypotensive in the ER resuscitated with IV fluids.  X-ray of the right tibia-fibula showed displaced fracture, CT CAP was negative for acute pathology.  Trauma surgery and orthopedic were consulted recommending immobilization of the long-leg splint with eventual plans for surgery. Status post ORIF 7/21 by orthopedic.  Postop management including weightbearing precautions, DVT prophylaxis, pain management and wound care per orthopedic team.  Hospital course complicated by acute anemia requiring PRBC transfusion.  Getting Lokelma  for hypokalemia as well and now developed an ileus.  Will continue bowel regimen as below and if his ileus improves and he has bowel movements likely can be discharged to SNF in the AM now that authorization has been obtained  Assessment & Plan:  Principal Problem:   Tibia/fibula fracture Active Problems:   Type 2 diabetes mellitus (HCC)   Chronic kidney disease (CKD), stage III (moderate) (HCC)   Hypotension   Hyperkalemia   Malnutrition of moderate degree   Right periprosthetic tib-fib fractures Status post ORIF 7/21 by orthopedic.  Postop management including weightbearing precautions, DVT prophylaxis, pain management and wound care per orthopedic team.  Will change pain management regimen given his confusion overnight.  Will schedule acetaminophen  at 1000 mg p.o. daily, change the Dilaudid  to fentanyl  and change the dose of oxycodone  to 2.5 to 5 mg -CK-normal but WBC  remains elevated and current trend showing some slight improvement:  Recent Labs  Lab 08/25/23 0420 08/26/23 0345 08/27/23 0630 08/28/23 0340 08/29/23 0430 08/30/23 0219 08/31/23 0411  WBC 11.3* 11.2* 12.4* 11.0* 9.0 8.0 8.8  -Repeat CBC in the AM Ortho recommending aspirin  325 mg daily for 30 days for DVT prophylaxis.  Also recommending vitamin D  supplementation and continuing nonweightbearing to the right lower extremity but is okay with passive and active knee motion. Appears improved but now has an ileus. If improves can D/C to SNF in the AM  Acute Blood Loss Anemia:  Baseline hemoglobin 9.2, dropped down to 5.3.  Hemoglobin improved 8.7 but postop drifted down 7.1 so he was given an additional unit PRBC; S/p Total of 3 units of pRBCs. hemoglobin/hematocrit had improved now and current trend showing: Recent Labs  Lab 08/25/23 0420 08/26/23 0345 08/27/23 0630 08/28/23 0340 08/29/23 0430 08/30/23 0219 08/31/23 0411  HGB 8.7* 7.1* 9.5* 8.0* 8.0* 8.2* 8.3*  HCT 27.1* 22.9* 29.3* 24.9* 25.0* 25.8* 26.6*  MCV 86.9 89.1 91.0 90.9 90.6 89.9 91.4  -CTM for signs or symptoms of bleeding; no overt bleeding noted.  Repeat CBC in the a.m.   Hypotension: Resolved after fluid resuscitation. CTM BP per Protocol. Last BP reading was 134/64   Hyperkalemia: Mild and Improved now after Lokelma . K+ is now 4.2   Type 2 diabetes: A1c 7.6 on 08/10/2023.  Placed on sensitive sliding scale insulin . CBGs Trend:  Recent Labs  Lab 08/30/23 0604 08/30/23 1218 08/30/23 1540 08/30/23 2127 08/31/23 0618 08/31/23 1253 08/31/23 1652  GLUCAP 147* 161* 186* 146* 146* 155* 128*    AKI on CKD stage IIIa / Metabolic Acidosis: Improving. Creatinine 1.97 (  baseline 1.4-1.8).  Continue IV fluid hydration with normal saline at 75 mL/h for now and monitor renal function. Current BUN/Cr Trend improving now and getting close to his Baseline: Recent Labs  Lab 08/25/23 0420 08/26/23 0345 08/27/23 0630  08/28/23 0340 08/29/23 0430 08/30/23 0219 08/31/23 0411  BUN 24* 21 27* 33* 32* 29* 25*  CREATININE 1.82* 2.02* 2.48* 2.63* 2.17* 1.81* 1.58*  -Has a slight acidosis with a CO2 20, anion gap of 7, chloride level of 112 -IVF now to stop -Urinalysis which was relatively unremarkable but did show many bacteria, urine osmolality was 385, urine sodium was 37, urine creatinine is 110.  Renal ultrasound is still pending. -Avoid Nephrotoxic Medications, Contrast Dyes, Hypotension and Dehydration to Ensure Adequate Renal Perfusion and will need to Renally Adjust Meds -Continue to Monitor and Trend Renal Function carefully and repeat CMP in the AM    COPD: Stable, no signs of acute exacerbation.  Continue home inhaler and Breztri . CTM Respiratory Status carefully; CXR done today and showed Shallow inspiration with bibasilar atelectasis. No focal consolidation. A small right pleural effusions suspected. No pneumothorax. Stable cardiac silhouette. No acute osseous pathology. CTM respiratory status carefully  Constipation and now ileus: Slowly improving. Changed Docusate 100 mg po BID to Senna-Docusate 1 tab po BID and Schedule Miralax  to 17 grams po BID. Add Bisacodyl  Suppository 10 mg RC; Still has not a bowel movement yet and has had Suppository x2. KUB done and showed The diffuse gaseous distention of the colon seen previously is similar today with stool visible in the right colon. There may be some minimal gas in the rectum. Bibasilar atelectasis again noted in the lung bases. -Will give a Fleet Enema and CTM and also order IV Metaclopramide 10 mg x1 again  -Will repeat KUB in the a.m.  Hyponatremia: Mild. Na+ is now improved to 139. CTM and Trend and repeat CMP w/in 1 week   Thrombocytopenia: Mild and resolved now.  Patient's platelet count is now 307. Continue to monitor [place] no bleeding; no overt bleeding noted; repeat CBC in AM.  Poor Po Intake / Moderate Malnutrition in the Context of  Chronic illness: Consulted RD. Nutrition Status: Nutrition Problem: Moderate Malnutrition Etiology: chronic illness Signs/Symptoms: mild fat depletion, moderate muscle depletion Interventions: Glucerna shake, Magic cup  Hypoalbuminemia: Patient's Albumin  Lvl went from 3.0 -> 2.2 -> 2.0 again. CTM and replete as Necessary. Repeat CMP in the AM    DVT prophylaxis: enoxaparin  (LOVENOX ) injection 40 mg Start: 09/01/23 0800 SCDs Start: 08/26/23 0106 SCDs Start: 08/24/23 0210    Code Status: Full Code Family Communication: No family present @ bedside   Disposition Plan:  Level of care: Progressive Status is: Inpatient Remains inpatient appropriate because: Needs ileus to improve prior D/C to SNF   Consultants:  Orthopedic Surgery   Procedures:  As delineated as above   Antimicrobials:  Anti-infectives (From admission, onward)    Start     Dose/Rate Route Frequency Ordered Stop   08/26/23 0115  ceFAZolin  (ANCEF ) IVPB 2g/100 mL premix        2 g 200 mL/hr over 30 Minutes Intravenous Every 8 hours 08/26/23 0105 08/26/23 1326   08/25/23 1259  vancomycin  (VANCOCIN ) powder  Status:  Discontinued          As needed 08/25/23 1300 08/25/23 1352   08/25/23 1210  ceFAZolin  (ANCEF ) 2-4 GM/100ML-% IVPB       Note to Pharmacy: [facility], [place]: cabinet override      08/25/23 1210  08/25/23 1220       Subjective: Seen and examined at beside and happy that he had some bowel movements yesterday. Abdomen is still a little sore. States pain in his leg bothers him but the pain control helps. No CP or SOB. No other concerns or complaints at this time.   Objective: Vitals:   08/31/23 0745 08/31/23 0930 08/31/23 1255 08/31/23 1620  BP:  (!) 146/81 138/71 134/64  Pulse:  99 86 82  Resp:  17 19 18   Temp:  99 F (37.2 C) 98.4 F (36.9 C) 97.6 F (36.4 C)  TempSrc:  Oral Oral Oral  SpO2: 97% 99% 100% 100%  Weight:      Height:        Intake/Output Summary (Last 24 hours) at 08/31/2023 2007 Last  data filed at 08/31/2023 0533 Gross per 24 hour  Intake 803.67 ml  Output 750 ml  Net 53.67 ml   Filed Weights   08/23/23 2027  Weight: 68 kg   Examination: Physical Exam:  Constitutional: Thin elderly chronically ill-appearing Caucasian male in NAD Respiratory: Diminished to auscultation bilaterally, no wheezing, rales, rhonchi or crackles. Normal respiratory effort and patient is not tachypenic. No accessory muscle use. Unlabored breathing  Cardiovascular: RRR, no murmurs / rubs / gallops. S1 and S2 auscultated. Right leg is wrapped Abdomen: Soft, tender to palpate, Distended 2/2 body habitus Bowel sounds are a little diminished GU: Deferred. Musculoskeletal: Right Leg is immobilized  Skin: No rashes, lesions, ulcers on a limited skin evaluation. No induration; Warm and dry.  Neurologic: CN 2-12 grossly intact with no focal deficits. Romberg sign cerebellar reflexes not assessed.  Psychiatric: Normal judgment and insight. Alert and oriented x 3.   Data Reviewed: I have personally reviewed following labs and imaging studies  CBC: Recent Labs  Lab 08/27/23 0630 08/28/23 0340 08/29/23 0430 08/30/23 0219 08/31/23 0411  WBC 12.4* 11.0* 9.0 8.0 8.8  NEUTROABS  --  8.6* 7.0 6.3 6.5  HGB 9.5* 8.0* 8.0* 8.2* 8.3*  HCT 29.3* 24.9* 25.0* 25.8* 26.6*  MCV 91.0 90.9 90.6 89.9 91.4  PLT 141* 164 214 253 307   Basic Metabolic Panel: Recent Labs  Lab 08/25/23 0420 08/26/23 0345 08/27/23 0630 08/28/23 0340 08/29/23 0430 08/30/23 0219 08/31/23 0411  NA 137   < > 132* 131* 134* 135 139  K 5.3*   < > 5.7* 4.5 4.3 4.2 4.7  CL 107   < > 103 101 105 105 112*  CO2 22   < > 20* 20* 21* 20* 20*  GLUCOSE 174*   < > 190* 146* 172* 132* 132*  BUN 24*   < > 27* 33* 32* 29* 25*  CREATININE 1.82*   < > 2.48* 2.63* 2.17* 1.81* 1.58*  CALCIUM 8.3*   < > 8.1* 7.8* 7.8* 8.1* 8.3*  MG 1.9   < > 1.9 2.0 2.1 1.9 1.9  PHOS 3.6  --   --  4.8* 3.1 2.8 3.0   < > = values in this interval not  displayed.   GFR: Estimated Creatinine Clearance: 35.9 mL/min (A) (by C-G formula based on SCr of 1.58 mg/dL (H)). Liver Function Tests: Recent Labs  Lab 08/28/23 0340 08/29/23 0430 08/30/23 0219 08/31/23 0411  AST 26 23 26  32  ALT <5 5 5 8   ALKPHOS 80 97 94 100  BILITOT 0.9 0.9 0.7 0.8  PROT 5.2* 5.2* 5.0* 5.1*  ALBUMIN  2.2* 2.0* 2.0* 2.0*   No results for input(s): LIPASE, AMYLASE  in the last 168 hours. No results for input(s): AMMONIA in the last 168 hours. Coagulation Profile: No results for input(s): INR, PROTIME in the last 168 hours. Cardiac Enzymes: No results for input(s): CKTOTAL, CKMB, CKMBINDEX, TROPONINI in the last 168 hours. BNP (last 3 results) No results for input(s): PROBNP in the last 8760 hours. HbA1C: No results for input(s): HGBA1C in the last 72 hours. CBG: Recent Labs  Lab 08/30/23 1540 08/30/23 2127 08/31/23 0618 08/31/23 1253 08/31/23 1652  GLUCAP 186* 146* 146* 155* 128*   Lipid Profile: No results for input(s): CHOL, HDL, LDLCALC, TRIG, CHOLHDL, LDLDIRECT in the last 72 hours. Thyroid Function Tests: No results for input(s): TSH, T4TOTAL, FREET4, T3FREE, THYROIDAB in the last 72 hours. Anemia Panel: No results for input(s): VITAMINB12, FOLATE, FERRITIN, TIBC, IRON , RETICCTPCT in the last 72 hours. Sepsis Labs: No results for input(s): PROCALCITON, LATICACIDVEN in the last 168 hours.  Recent Results (from the past 240 hours)  Surgical PCR screen     Status: None   Collection Time: 08/24/23  8:36 PM   Specimen: Nasal Mucosa; Nasal Swab  Result Value Ref Range Status   MRSA, PCR NEGATIVE NEGATIVE Final   Staphylococcus aureus NEGATIVE NEGATIVE Final    Comment: (NOTE) The Xpert SA Assay (FDA approved for NASAL specimens in patients 42 years of age and older), is one component of a comprehensive surveillance program. It is not intended to diagnose infection nor to guide or  monitor treatment. Performed at [facility], [address] [place], [place] [address]     Radiology Studies: DG CHEST PORT 1 VIEW Result Date: 08/31/2023 CLINICAL DATA:  Shortness of breath. EXAM: PORTABLE CHEST 1 VIEW COMPARISON:  Chest radiograph dated 08/23/2023. FINDINGS: Shallow inspiration with bibasilar atelectasis. No focal consolidation. A small right pleural effusions suspected. No pneumothorax. Stable cardiac silhouette. No acute osseous pathology. IMPRESSION: Shallow inspiration with bibasilar atelectasis. Electronically Signed   By: Vanetta Chou M.D.   On: 08/31/2023 15:50   DG Abd 1 View Result Date: 08/31/2023 CLINICAL DATA:  Abdominal distension. EXAM: ABDOMEN - 1 VIEW COMPARISON:  08/30/2023 FINDINGS: The diffuse gaseous distention of the colon seen previously is similar today with stool visible in the right colon. There may be some minimal gas in the rectum. Bibasilar atelectasis again noted in the lung bases. IMPRESSION: Persistent diffuse gaseous distention of the colon. No substantial change. Electronically Signed   By: Camellia Candle M.D.   On: 08/31/2023 10:48   DG Abd 1 View Result Date: 08/30/2023 CLINICAL DATA:  Abdominal distention EXAM: ABDOMEN - 1 VIEW COMPARISON:  CT 08/23/2023 FINDINGS: Diffuse gaseous distention of bowel, most notable throughout the colon. Large stool burden in the right colon. Findings most suggestive of ileus. No organomegaly or free air. Bibasilar atelectasis. IMPRESSION: Diffuse significant gaseous distention of bowel suggesting ileus. Electronically Signed   By: Franky Crease M.D.   On: 08/30/2023 15:29   Scheduled Meds:  acetaminophen   1,000 mg Oral Q6H   Or   acetaminophen   650 mg Rectal Q6H   budesonide -glycopyrrolate -formoterol   2 puff Inhalation BID   cholecalciferol   1,000 Units Oral Daily   [START ON 09/01/2023] enoxaparin  (LOVENOX ) injection  40 mg Subcutaneous Q24H   feeding supplement (GLUCERNA SHAKE)  237 mL Oral TID BM    gabapentin   300 mg Oral QHS   guaiFENesin   1,200 mg Oral BID   insulin  aspart  0-5 Units Subcutaneous QHS   insulin  aspart  0-9 Units Subcutaneous TID WC  polyethylene glycol  17 g Oral BID   senna-docusate  1 tablet Oral BID   sodium bicarbonate   650 mg Oral BID   sodium chloride  flush  10-40 mL Intracatheter Q12H   sodium phosphate   1 enema Rectal Once   Continuous Infusions:   LOS: 7 days   Alejandro Marker, DO [facility] Available via Epic secure chat 7am-7pm After these hours, please refer to coverage provider listed on [email or web address] 08/31/2023, 8:07 PM

## 2023-09-01 ENCOUNTER — Inpatient Hospital Stay (HOSPITAL_COMMUNITY)

## 2023-09-01 DIAGNOSIS — S82201A Unspecified fracture of shaft of right tibia, initial encounter for closed fracture: Secondary | ICD-10-CM | POA: Diagnosis not present

## 2023-09-01 DIAGNOSIS — E875 Hyperkalemia: Secondary | ICD-10-CM | POA: Diagnosis not present

## 2023-09-01 DIAGNOSIS — I959 Hypotension, unspecified: Secondary | ICD-10-CM | POA: Diagnosis not present

## 2023-09-01 DIAGNOSIS — E440 Moderate protein-calorie malnutrition: Secondary | ICD-10-CM

## 2023-09-01 DIAGNOSIS — N1832 Chronic kidney disease, stage 3b: Secondary | ICD-10-CM | POA: Diagnosis not present

## 2023-09-01 LAB — CBC WITH DIFFERENTIAL/PLATELET
Abs Immature Granulocytes: 0.14 K/uL — ABNORMAL HIGH (ref 0.00–0.07)
Basophils Absolute: 0 K/uL (ref 0.0–0.1)
Basophils Relative: 0 %
Eosinophils Absolute: 0.4 K/uL (ref 0.0–0.5)
Eosinophils Relative: 5 %
HCT: 25.6 % — ABNORMAL LOW (ref 39.0–52.0)
Hemoglobin: 7.9 g/dL — ABNORMAL LOW (ref 13.0–17.0)
Immature Granulocytes: 2 %
Lymphocytes Relative: 10 %
Lymphs Abs: 0.9 K/uL (ref 0.7–4.0)
MCH: 27.9 pg (ref 26.0–34.0)
MCHC: 30.9 g/dL (ref 30.0–36.0)
MCV: 90.5 fL (ref 80.0–100.0)
Monocytes Absolute: 0.8 K/uL (ref 0.1–1.0)
Monocytes Relative: 9 %
Neutro Abs: 6.5 K/uL (ref 1.7–7.7)
Neutrophils Relative %: 74 %
Platelets: 341 K/uL (ref 150–400)
RBC: 2.83 MIL/uL — ABNORMAL LOW (ref 4.22–5.81)
RDW: 15.4 % (ref 11.5–15.5)
WBC: 8.8 K/uL (ref 4.0–10.5)
nRBC: 0 % (ref 0.0–0.2)

## 2023-09-01 LAB — COMPREHENSIVE METABOLIC PANEL WITH GFR
ALT: 10 U/L (ref 0–44)
AST: 33 U/L (ref 15–41)
Albumin: 1.8 g/dL — ABNORMAL LOW (ref 3.5–5.0)
Alkaline Phosphatase: 98 U/L (ref 38–126)
Anion gap: 7 (ref 5–15)
BUN: 19 mg/dL (ref 8–23)
CO2: 23 mmol/L (ref 22–32)
Calcium: 8.1 mg/dL — ABNORMAL LOW (ref 8.9–10.3)
Chloride: 108 mmol/L (ref 98–111)
Creatinine, Ser: 1.49 mg/dL — ABNORMAL HIGH (ref 0.61–1.24)
GFR, Estimated: 47 mL/min — ABNORMAL LOW (ref >60)
Glucose, Bld: 197 mg/dL — ABNORMAL HIGH (ref 70–99)
Potassium: 4.2 mmol/L (ref 3.5–5.1)
Sodium: 138 mmol/L (ref 135–145)
Total Bilirubin: 1 mg/dL (ref 0.0–1.2)
Total Protein: 5 g/dL — ABNORMAL LOW (ref 6.5–8.1)

## 2023-09-01 LAB — GLUCOSE, CAPILLARY
Glucose-Capillary: 114 mg/dL — ABNORMAL HIGH (ref 70–99)
Glucose-Capillary: 176 mg/dL — ABNORMAL HIGH (ref 70–99)
Glucose-Capillary: 107 mg/dL — ABNORMAL HIGH (ref 70–99)

## 2023-09-01 LAB — PHOSPHORUS: Phosphorus: 2.6 mg/dL (ref 2.5–4.6)

## 2023-09-01 LAB — MAGNESIUM: Magnesium: 1.7 mg/dL (ref 1.7–2.4)

## 2023-09-01 MED ORDER — ACETAMINOPHEN 500 MG PO TABS: 1000.0000 mg | ORAL_TABLET | Freq: Four times a day (QID) | ORAL | Status: DC | PRN

## 2023-09-01 MED ORDER — GLUCERNA SHAKE PO LIQD: 237.0000 mL | Freq: Three times a day (TID) | ORAL | Status: DC

## 2023-09-01 MED ORDER — POLYETHYLENE GLYCOL 3350 17 G PO PACK: 17.0000 g | PACK | Freq: Every day | ORAL | Status: AC

## 2023-09-01 MED ORDER — IPRATROPIUM-ALBUTEROL 0.5-2.5 (3) MG/3ML IN SOLN: 3.0000 mL | RESPIRATORY_TRACT | Status: DC | PRN

## 2023-09-01 MED ORDER — SENNOSIDES-DOCUSATE SODIUM 8.6-50 MG PO TABS: 1.0000 | ORAL_TABLET | Freq: Every day | ORAL | Status: DC

## 2023-09-01 MED ORDER — GUAIFENESIN ER 600 MG PO TB12: 600.0000 mg | ORAL_TABLET | Freq: Two times a day (BID) | ORAL | Status: AC

## 2023-09-01 NOTE — Progress Notes (Signed)
 Called report to [facility] representative at the Rehab facility. AVS printed.Patient is aware. Removing Te;emetry and PIV.SABRA

## 2023-09-01 NOTE — Discharge Summary (Signed)
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 tibia-fibula. Electronically Signed   By: Ree Molt M.D.   On: 08/21/2023 11:15   DG Foot Complete Left Result Date: 08/21/2023 CLINICAL DATA:  injury.  Freezer fell on patient's legs. EXAM: LEFT FOOT - COMPLETE 3+ VIEW; LEFT TIBIA AND FIBULA - 2 VIEW COMPARISON:  None Available. FINDINGS: No acute fracture or dislocation. No aggressive osseous lesion. A tiny calcaneal spur noted along the Achilles tendon attachment site. Mild diffuse arthritis of imaged joints. Ankle mortise appears intact. No focal soft tissue swelling. No radiopaque foreign bodies. IMPRESSION: No acute osseous abnormality of the left foot and tibia-fibula. Electronically Signed   By: Ree Molt M.D.   On: 08/21/2023 11:15   DG Tibia/Fibula Right Result Date: 08/21/2023 CLINICAL DATA:  injury. EXAM: RIGHT FOOT COMPLETE - 3+ VIEW; RIGHT TIBIA AND FIBULA - 2 VIEW COMPARISON:  None Available. FINDINGS: No acute fracture or dislocation. No aggressive osseous lesion. 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 The hardware is intact. No periprosthetic fracture or lucency. There is near anatomic alignment. Ankle mortise appears intact. A tiny calcaneal spur noted along the Achilles tendon attachment site. Mild diffuse degenerative changes of imaged joints. No focal soft tissue swelling. No radiopaque foreign bodies. IMPRESSION: No acute osseous abnormality of the right foot and leg. Electronically Signed   By: Ree Molt M.D.   On: 08/21/2023 11:14   DG Foot Complete Right Result Date: 08/21/2023 CLINICAL DATA:  injury. EXAM: RIGHT FOOT COMPLETE - 3+ VIEW; RIGHT TIBIA AND FIBULA - 2 VIEW COMPARISON:  None Available. FINDINGS: No acute fracture or dislocation. No aggressive osseous lesion. Patient is status post right total knee arthroplasty with patellar resurfacing. The hardware is intact. No periprosthetic fracture or lucency. There is near anatomic alignment. Ankle mortise appears intact. A tiny calcaneal spur noted along the Achilles tendon attachment site. Mild diffuse  degenerative changes of imaged joints. No focal soft tissue swelling. No radiopaque foreign bodies. IMPRESSION: No acute osseous abnormality of the right foot and leg. Electronically Signed   By: Ree Molt M.D.   On: 08/21/2023 11:14   CT FOREARM RIGHT W CONTRAST Result Date: 08/10/2023 CLINICAL DATA:  Soft tissue mass, forearm, deep eval for infection/osteo, abscess Blistering right forearm wound with erythema. Skin tear sustained in fall 2 months ago. EXAM: CT OF THE UPPER RIGHT EXTREMITY WITH CONTRAST TECHNIQUE: Multidetector CT imaging of the right forearm was performed according to the standard protocol following intravenous contrast administration. RADIATION DOSE REDUCTION: This exam was performed according to the departmental dose-optimization program which includes automated exposure control, adjustment of the mA and/or kV according to patient size and/or use of iterative reconstruction  technique. CONTRAST:  60mL OMNIPAQUE  IOHEXOL  300 MG/ML  SOLN COMPARISON:  None recent. Limited correlation made with right wrist radiographs 04/30/2022. FINDINGS: Bones/Joint/Cartilage Examination was performed with the arm elevated over the patient's head. Images extend from the distal upper arm through the hand. There is no evidence of acute fracture, dislocation or osteomyelitis within the forearm. Mild degenerative changes at the elbow without significant elbow joint effusion. There are moderate degenerative changes at the wrist, with involvement of the distal radioulnar and radiocarpal joints and. There also degenerative changes in the hand with involvement of the 1st carpometacarpal, scaphotrapeziotrapezoidal, metacarpal phalangeal and interphalangeal joints. No large joint effusions identified. Ligaments Suboptimally assessed by CT. Muscles and Tendons No intramuscular fluid collections or abnormal enhancement identified within the forearm. The biceps and triceps tendons appear intact. The wrist tendons appear intact as evaluated by CT. No evidence of significant tenosynovitis. Soft tissues There is mild to moderate subcutaneous edema within the dorsal and ulnar aspects of the proximal to mid forearm. No focal fluid collection, foreign body or soft tissue emphysema identified. IMPRESSION: 1. Mild to moderate subcutaneous edema within the dorsal and ulnar aspects of the proximal to mid forearm, nonspecific but compatible with cellulitis in this clinical context. No focal fluid collection, foreign body or soft tissue emphysema identified. 2. No evidence of acute fracture, dislocation or osteomyelitis within the forearm. 3. Multifocal degenerative changes at the elbow, wrist and hand as described. Electronically Signed   By: Elsie Perone M.D.   On: 08/10/2023 13:04   Microbiology: Results for orders placed or performed during the hospital encounter of 08/23/23  Surgical PCR screen     Status: None    Collection Time: 08/24/23  8:36 PM   Specimen: Nasal Mucosa; Nasal Swab  Result Value Ref Range Status   MRSA, PCR NEGATIVE NEGATIVE Final   Staphylococcus aureus NEGATIVE NEGATIVE Final    Comment: (NOTE) The Xpert SA Assay (FDA approved for NASAL specimens in patients 31 years of age and older), is one component of a comprehensive surveillance program. It is not intended to diagnose infection nor to guide or monitor treatment. Performed at [facility], [address] [place], [place] [address]    Labs: CBC: Recent Labs  Lab 08/28/23 0340 08/29/23 0430 08/30/23 0219 08/31/23 0411 09/01/23 0937  WBC 11.0* 9.0 8.0 8.8 8.8  NEUTROABS 8.6* 7.0 6.3 6.5 6.5  HGB 8.0* 8.0* 8.2* 8.3* 7.9*  HCT 24.9* 25.0* 25.8* 26.6* 25.6*  MCV 90.9 90.6 89.9 91.4 90.5  PLT 164 214 253 307 341   Basic Metabolic Panel: Recent Labs  Lab 08/28/23 0340 08/29/23 0430 08/30/23 0219 08/31/23 0411 09/01/23 0937  NA 131* 134* 135 139  138  K 4.5 4.3 4.2 4.7 4.2  CL 101 105 105 112* 108  CO2 20* 21* 20* 20* 23  GLUCOSE 146* 172* 132* 132* 197*  BUN 33* 32* 29* 25* 19  CREATININE 2.63* 2.17* 1.81* 1.58* 1.49*  CALCIUM 7.8* 7.8* 8.1* 8.3* 8.1*  MG 2.0 2.1 1.9 1.9 1.7  PHOS 4.8* 3.1 2.8 3.0 2.6   Liver Function Tests: Recent Labs  Lab 08/28/23 0340 08/29/23 0430 08/30/23 0219 08/31/23 0411 09/01/23 0937  AST 26 23 26  32 33  ALT <5 5 5 8 10   ALKPHOS 80 97 94 100 98  BILITOT 0.9 0.9 0.7 0.8 1.0  PROT 5.2* 5.2* 5.0* 5.1* 5.0*  ALBUMIN  2.2* 2.0* 2.0* 2.0* 1.8*   CBG: Recent Labs  Lab 08/31/23 1253 08/31/23 1652 08/31/23 2120 09/01/23 0619 09/01/23 1217  GLUCAP 155* 128* 110* 114* 176*   Discharge time spent: greater than 30 minutes.  Signed: Alejandro Marker, DO [facility] 09/01/2023

## 2023-09-01 NOTE — TOC Progression Note (Addendum)
 Transition of Care [facility]) - Progression Note    Patient Details  Name: Joe Coleman MRN: [id number] Date of Birth: Nov 28, 1945  [facility] CM/SW Contact  Inocente GORMAN Kindle, LCSW Phone Number: 09/01/2023, 12:03 PM  Clinical Narrative:    CSW updated [facility] of patient's discharge today. CSW updated patient's cousin, Levander. He reported agreement with [facility] for transport.    Expected Discharge Plan: Skilled Nursing Facility Barriers to Discharge: Barriers Resolved               Expected Discharge Plan and Services In-house Referral: Clinical Social Work   Post Acute Care Choice: Skilled Nursing Facility Living arrangements for the past 2 months: Apartment                                       Social Drivers of Health (SDOH) Interventions SDOH Screenings   Food Insecurity: Food Insecurity Present (05/02/2021)   Received from [facility]  Housing: Unknown (03/04/2023)   Received from [facility]  Transportation Needs: No Transportation Needs (04/09/2021)   Received from [facility]  Financial Resource Strain: Medium Risk (05/02/2021)   Received from [facility]  Physical Activity: Inactive (08/25/2018)   Received from [facility]  Social Connections: Socially Isolated (08/25/2018)   Received from [facility]  Stress: No Stress Concern Present (08/25/2018)   Received from [facility]  Tobacco Use: Medium Risk (08/25/2023)  Health Literacy: High Risk (05/04/2022)   Received from [facility]    Readmission Risk Interventions    09/01/2023   12:00 PM  Readmission Risk Prevention Plan  Transportation Screening Complete  Medication Review (RN Care Manager) Complete  PCP or Specialist appointment within 3-5 days of discharge Complete  HRI or Home Care Consult Complete  SW Recovery Care/Counseling Consult Complete  Palliative Care Screening Not Applicable  Skilled Nursing Facility Complete

## 2023-09-01 NOTE — TOC Transition Note (Signed)
 Transition of Care [facility]) - Discharge Note   Patient Details  Name: Joe Coleman MRN: [id number] Date of Birth: 1945/02/21  Transition of Care [facility]) CM/SW Contact:  Joe GORMAN Kindle, Joe Coleman Phone Number: 09/01/2023, 2:48 PM   Clinical Narrative:    Patient will DC to: [facility] [place] Anticipated DC date: 09/01/23 Family notified: Joe Coleman Transport by: [facility]   Per MD patient ready for DC to [facility]. RN to call report prior to discharge [phone] room [phone]). RN, patient, patient's family, and facility notified of DC. Discharge Summary and FL2 sent to facility. DC packet on chart including signed script. Ambulance transport requested for patient.   CSW will sign off for now as social work intervention is no longer needed. Please consult us  again if new needs arise.     Final next level of care: Skilled Nursing Facility Barriers to Discharge: Barriers Resolved   Patient Goals and CMS Choice Patient states their goals for this hospitalization and ongoing recovery are:: Rehab CMS Medicare.gov Compare Post Acute Care list provided to:: Patient Choice offered to / list presented to : Patient [place] ownership interest in [facility].provided to:: Patient    Discharge Placement   Existing PASRR number confirmed : 09/01/23          Patient chooses bed at: [facility] [place] Patient to be transferred to facility by: [facility] Name of family member notified: Joe Coleman Patient and family notified of of transfer: 09/01/23  Discharge Plan and Services Additional resources added to the After Visit Summary for   In-house Referral: Clinical Social Work   Post Acute Care Choice: Skilled Nursing Facility                               Social Drivers of Health (SDOH) Interventions SDOH Screenings   Food Insecurity: Food Insecurity Present (05/02/2021)   [facility]  Housing: Unknown (03/04/2023)   [facility] from [facility]  Transportation Needs: No Transportation Needs (04/09/2021)   Received from [facility]  Financial Resource Strain: Medium Risk (05/02/2021)   [facility] from [facility]  Physical Activity: Inactive (08/25/2018)   [facility]  Social Connections: Socially Isolated (08/25/2018)   Received from [facility]  Stress: No Stress Concern Present (08/25/2018)   Received from [facility]  Tobacco Use: Medium Risk (08/25/2023)  Health Literacy: High Risk (05/04/2022)   Received from [facility]     Readmission Risk Interventions    09/01/2023   12:00 PM  Readmission Risk Prevention Plan  Transportation Screening Complete  Medication Review (RN Care Manager) Complete  PCP or Specialist appointment within 3-5 days of discharge Complete  HRI or Home Care Consult Complete  SW Recovery Care/Counseling Consult Complete  Palliative Care Screening Not Applicable  Skilled Nursing Facility Complete

## 2023-09-01 NOTE — Progress Notes (Signed)
 Patient refused miralax , senokot, and enema.  Stated that he did not want to have a bowel movement in the middle of the night that he needed to sleep.  RN educated patient as to why medication was scheduled to be given.  Patient still advised that did not want to take.

## 2023-09-01 NOTE — Progress Notes (Signed)
 Physical Therapy Treatment Patient Details Name: Joe Coleman MRN: [id number] DOB: 1945-09-06 Today's Date: 09/01/2023   History of Present Illness Joe Coleman is an 78 y.o. male admitted 7/19 after sustaining right proximal tibia periprosthetic fracture. Pt also with degloving injury of the RLE. Pt underwent ORIF right LE 7/21.  PMH: COPD, diabetes.    PT Comments  Tolerated treatment well. Progressing transfer training, mod assist to stand from elevated surface, NWB on RLE with KI in place for mobilizing. Performed again from recliner with cues for hand placement, mod A. Reviewed gentle knee ROM exercises, precautions, and KI use when mobilizing. Patient will continue to benefit from skilled physical therapy services to further improve independence with functional mobility.     If plan is discharge home, recommend the following: A lot of help with bathing/dressing/bathroom;Assistance with cooking/housework;Assist for transportation;Help with stairs or ramp for entrance;Two people to help with walking and/or transfers;Supervision due to cognitive status   Can travel by private vehicle     No  Equipment Recommendations  BSC/3in1;Wheelchair (measurements PT);Wheelchair cushion (measurements PT);Rolling walker (2 wheels);Hospital bed (at current level would need hoyer lift; consider slide board also pending progress)    Recommendations for Other Services       Precautions / Restrictions Precautions Precautions: Fall Recall of Precautions/Restrictions: Impaired Precaution/Restrictions Comments: needs reminders for RLE ROM as tolerated and to have staff elevate RLE at rest due to edema Required Braces or Orthoses: Knee Immobilizer - Right Knee Immobilizer - Right: On when out of bed or walking Restrictions Weight Bearing Restrictions Per Provider Order: Yes RLE Weight Bearing Per Provider Order: Non weight bearing     Mobility  Bed Mobility Overal bed mobility: Needs  Assistance Bed Mobility: Supine to Sit     Supine to sit: Min assist, HOB elevated     General bed mobility comments: Min assist to gently support RLE off bed. Educated on use of KI, and hooking LLE under Rt to support as needed.    Transfers Overall transfer level: Needs assistance Equipment used: Rolling walker (2 wheels) Transfers: Bed to chair/wheelchair/BSC, Sit to/from Stand Sit to Stand: Mod assist, From elevated surface Stand pivot transfers: Mod assist         General transfer comment: Mod assist for boost to stand from elevated bed surface x2. First attempt unable to fully extend Lt knee with transition. Second attempt able to stand upright. Maintains NWB on RLE with gentle cues. Mod assist for pivot on Lt foot to recliner, cues for hand placement. Performe sit to stand from recliner again with cues for hand placement, RLE placement awareness and mod assist for boost and RLE support to maintain NWB on Rt. Able to take one small hop in place    Ambulation/Gait               General Gait Details: unable   Stairs             Wheelchair Mobility     Tilt Bed    Modified Rankin (Stroke Patients Only)       Balance Overall balance assessment: Needs assistance Sitting-balance support: Feet supported, No upper extremity supported Sitting balance-Leahy Scale: Fair     Standing balance support: Bilateral upper extremity supported, Reliant on assistive device for balance Standing balance-Leahy Scale: Poor Standing balance comment: Stands with min assist  Communication Communication Communication: No apparent difficulties  Cognition Arousal: Alert Behavior During Therapy: WFL for tasks assessed/performed   PT - Cognitive impairments: No family/caregiver present to determine baseline, Sequencing, Problem solving, Safety/Judgement                         Following commands: Impaired Following commands  impaired: Follows one step commands with increased time, Only follows one step commands consistently    Cueing Cueing Techniques: Verbal cues, Tactile cues, Gestural cues  Exercises General Exercises - Lower Extremity Ankle Circles/Pumps: AROM, Both, Supine, 10 reps Other Exercises Other Exercises: A/AAROM Rt knee flex/ext x10 with 30 hold at end range Other Exercises: PROM dependent hang with light support for Rt knee flexion to approx 45 deg x 2 min    General Comments General comments (skin integrity, edema, etc.): SpO2 92% and greater on RA      Pertinent Vitals/Pain Pain Assessment Pain Assessment: Faces Faces Pain Scale: Hurts little more Pain Location: RLE Pain Descriptors / Indicators: Aching, Guarding Pain Intervention(s): Monitored during session, Repositioned    Home Living                          Prior Function            PT Goals (current goals can now be found in the care plan section) Acute Rehab PT Goals Patient Stated Goal: Go home PT Goal Formulation: With patient Time For Goal Achievement: 09/09/23 Potential to Achieve Goals: Good Progress towards PT goals: Progressing toward goals    Frequency    Min 2X/week      PT Plan      Co-evaluation              AM-PAC PT 6 Clicks Mobility   Outcome Measure  Help needed turning from your back to your side while in a flat bed without using bedrails?: A Lot Help needed moving from lying on your back to sitting on the side of a flat bed without using bedrails?: A Lot Help needed moving to and from a bed to a chair (including a wheelchair)?: A Lot Help needed standing up from a chair using your arms (e.g., wheelchair or bedside chair)?: A Lot Help needed to walk in hospital room?: Total Help needed climbing 3-5 steps with a railing? : Total 6 Click Score: 10    End of Session Equipment Utilized During Treatment: Gait belt;Right knee immobilizer Activity Tolerance: Patient  tolerated treatment well Patient left: with call bell/phone within reach;in chair;with chair alarm set;Other (comment);with SCD's reapplied (RLE elevated due to edema; SCD LLE) Nurse Communication: Mobility status;Other (comment);Weight bearing status;Precautions (+2 for stand pivot transfer with RW. KI for mobility) PT Visit Diagnosis: Muscle weakness (generalized) (M62.81);Other abnormalities of gait and mobility (R26.89);Pain;Difficulty in walking, not elsewhere classified (R26.2);Unsteadiness on feet (R26.81) Pain - Right/Left: Right Pain - part of body: Knee;Leg     Time: 9058-8990 PT Time Calculation (min) (ACUTE ONLY): 28 min  Charges:    $Therapeutic Exercise: 8-22 mins $Therapeutic Activity: 8-22 mins PT General Charges $$ ACUTE PT VISIT: 1 Visit                     Leontine Roads, PT, DPT [facility]  Rehabilitation Services Physical Therapist Office: [phone] Website: [email or web address]    Leontine GORMAN Roads 09/01/2023, 12:35 PM

## 2023-09-24 ENCOUNTER — Other Ambulatory Visit: Payer: Self-pay

## 2023-09-24 ENCOUNTER — Emergency Department
Admission: EM | Admit: 2023-09-24 | Discharge: 2023-09-24 | Disposition: A | Discharge status: Home / Self Care | Attending: Emergency Medicine | Admitting: Emergency Medicine

## 2023-09-24 DIAGNOSIS — L089 Local infection of the skin and subcutaneous tissue, unspecified: Secondary | ICD-10-CM | POA: Diagnosis not present

## 2023-09-24 DIAGNOSIS — J449 Chronic obstructive pulmonary disease, unspecified: Secondary | ICD-10-CM | POA: Insufficient documentation

## 2023-09-24 DIAGNOSIS — E119 Type 2 diabetes mellitus without complications: Secondary | ICD-10-CM | POA: Diagnosis not present

## 2023-09-24 DIAGNOSIS — L03115 Cellulitis of right lower limb: Secondary | ICD-10-CM | POA: Insufficient documentation

## 2023-09-24 DIAGNOSIS — Z9889 Other specified postprocedural states: Secondary | ICD-10-CM | POA: Diagnosis not present

## 2023-09-24 DIAGNOSIS — X58XXXD Exposure to other specified factors, subsequent encounter: Secondary | ICD-10-CM | POA: Insufficient documentation

## 2023-09-24 DIAGNOSIS — S82891D Other fracture of right lower leg, subsequent encounter for closed fracture with routine healing: Secondary | ICD-10-CM | POA: Diagnosis not present

## 2023-09-24 DIAGNOSIS — I10 Essential (primary) hypertension: Secondary | ICD-10-CM | POA: Insufficient documentation

## 2023-09-24 DIAGNOSIS — T8141XA Infection following a procedure, superficial incisional surgical site, initial encounter: Secondary | ICD-10-CM | POA: Diagnosis not present

## 2023-09-24 DIAGNOSIS — M25561 Pain in right knee: Secondary | ICD-10-CM | POA: Diagnosis present

## 2023-09-24 LAB — CBC WITH DIFFERENTIAL/PLATELET
Abs Immature Granulocytes: 0.18 K/uL — ABNORMAL HIGH (ref 0.00–0.07)
Basophils Absolute: 0 K/uL (ref 0.0–0.1)
Basophils Relative: 0 %
Eosinophils Absolute: 0.5 K/uL (ref 0.0–0.5)
Eosinophils Relative: 4 %
HCT: 34.7 % — ABNORMAL LOW (ref 39.0–52.0)
Hemoglobin: 10.8 g/dL — ABNORMAL LOW (ref 13.0–17.0)
Immature Granulocytes: 1 %
Lymphocytes Relative: 11 %
Lymphs Abs: 1.4 K/uL (ref 0.7–4.0)
MCH: 27.8 pg (ref 26.0–34.0)
MCHC: 31.1 g/dL (ref 30.0–36.0)
MCV: 89.2 fL (ref 80.0–100.0)
Monocytes Absolute: 0.9 K/uL (ref 0.1–1.0)
Monocytes Relative: 7 %
Neutro Abs: 9.7 K/uL — ABNORMAL HIGH (ref 1.7–7.7)
Neutrophils Relative %: 77 %
Platelets: 364 K/uL (ref 150–400)
RBC: 3.89 MIL/uL — ABNORMAL LOW (ref 4.22–5.81)
RDW: 14.3 % (ref 11.5–15.5)
WBC: 12.7 K/uL — ABNORMAL HIGH (ref 4.0–10.5)
nRBC: 0 % (ref 0.0–0.2)

## 2023-09-24 LAB — PROTIME-INR
INR: 1.1 (ref 0.8–1.2)
Prothrombin Time: 14.5 s (ref 11.4–15.2)

## 2023-09-24 LAB — COMPREHENSIVE METABOLIC PANEL WITH GFR
ALT: 9 U/L (ref 0–44)
AST: 20 U/L (ref 15–41)
Albumin: 3 g/dL — ABNORMAL LOW (ref 3.5–5.0)
Alkaline Phosphatase: 103 U/L (ref 38–126)
Anion gap: 13 (ref 5–15)
BUN: 45 mg/dL — ABNORMAL HIGH (ref 8–23)
CO2: 27 mmol/L (ref 22–32)
Calcium: 9.2 mg/dL (ref 8.9–10.3)
Chloride: 93 mmol/L — ABNORMAL LOW (ref 98–111)
Creatinine, Ser: 1.93 mg/dL — ABNORMAL HIGH (ref 0.61–1.24)
GFR, Estimated: 35 mL/min — ABNORMAL LOW (ref >60)
Glucose, Bld: 179 mg/dL — ABNORMAL HIGH (ref 70–99)
Potassium: 4.4 mmol/L (ref 3.5–5.1)
Sodium: 133 mmol/L — ABNORMAL LOW (ref 135–145)
Total Bilirubin: 0.7 mg/dL (ref 0.0–1.2)
Total Protein: 7 g/dL (ref 6.5–8.1)

## 2023-09-24 LAB — LACTIC ACID, PLASMA: Lactic Acid, Venous: 1.5 mmol/L (ref 0.5–1.9)

## 2023-09-24 MED ORDER — DOXYCYCLINE HYCLATE 100 MG PO TABS: 100.0000 mg | ORAL_TABLET | Freq: Two times a day (BID) | ORAL | 0 refills | Status: AC

## 2023-09-24 NOTE — ED Triage Notes (Signed)
 See first nurse note.

## 2023-09-24 NOTE — ED Triage Notes (Signed)
 Arrives from home via [facility].  Home health was at home to admit him into home health.  Had been at [facility] for 21 days of rehab after surgery. Discharged home about one week ago.  EMS reports that home health feels patient cannot live at home alone.  Right knee infection.  VS wnl

## 2023-09-24 NOTE — Discharge Instructions (Addendum)
 Please use ibuprofen (Motrin) up to 800 mg every 8 hours, naproxen (Naprosyn) up to 500 mg every 12 hours, and/or acetaminophen (Tylenol) up to 4 g/day for any continued pain.  Please do not use this medication regimen for longer than 7 days

## 2023-09-24 NOTE — ED Provider Notes (Signed)
 [facility] Provider Note   Event Date/Time   First MD Initiated Contact with Patient 09/24/23 1513     (approximate) History  Leg Pain  HPI Joe Coleman is a 78 y.o. male with a past medical history of hypertension, diabetes, and COPD who presents from home via EMS after home health evaluated his wounds today and found that they may need antibiotics.  Patient states that he has had increasing pain over the skin of the anterior right knee for the last week while he has been at [facility].  Patient states that they have been changing his dressings approximately daily however he has not had them changed in the last 3 days since he has been at home.  Patient denies any fevers, decreased range of motion at the knee, increased swelling about this joint, or significant pain with range of motion ROS: Patient currently denies any vision changes, tinnitus, difficulty speaking, facial droop, sore throat, chest pain, shortness of breath, abdominal pain, nausea/vomiting/diarrhea, dysuria, or weakness/numbness/paresthesias in any extremity   Physical Exam  Triage Vital Signs: ED Triage Vitals  Encounter Vitals Group     BP 09/24/23 1416 104/82     Girls Systolic BP Percentile --      Girls Diastolic BP Percentile --      Boys Systolic BP Percentile --      Boys Diastolic BP Percentile --      Pulse Rate 09/24/23 1416 (!) 106     Resp 09/24/23 1416 18     Temp 09/24/23 1416 98.4 F (36.9 C)     Temp src --      SpO2 09/24/23 1416 100 %     Weight 09/24/23 1417 149 lb 14.6 oz (68 kg)     Height 09/24/23 1417 5' 8 (1.727 m)     Head Circumference --      Peak Flow --      Pain Score 09/24/23 1417 10     Pain Loc --      Pain Education --      Exclude from Growth Chart --    Most recent vital signs: Vitals:   09/24/23 1416  BP: 104/82  Pulse: (!) 106  Resp: 18  Temp: 98.4 F (36.9 C)  SpO2: 100%   General: Awake, oriented x4. CV:  Good  peripheral perfusion. Resp:  Normal effort. Abd:  No distention. Other:  Elderly disheveled Caucasian male resting comfortably in no acute distress.  Scattered areas of healing wounds about the anterior of the right knee with 2 ulcerations in the lower anterior knee joint that shows surrounding erythema and mild purulent drainage ED Results / Procedures / Treatments  Labs (all labs ordered are listed, but only abnormal results are displayed) Labs Reviewed  CBC WITH DIFFERENTIAL/PLATELET - Abnormal; Notable for the following components:      Result Value   WBC 12.7 (*)    RBC 3.89 (*)    Hemoglobin 10.8 (*)    HCT 34.7 (*)    Neutro Abs 9.7 (*)    Abs Immature Granulocytes 0.18 (*)    All other components within normal limits  COMPREHENSIVE METABOLIC PANEL WITH GFR - Abnormal; Notable for the following components:   Sodium 133 (*)    Chloride 93 (*)    Glucose, Bld 179 (*)    BUN 45 (*)    Creatinine, Ser 1.93 (*)    Albumin  3.0 (*)    GFR, Estimated 35 (*)  All other components within normal limits  PROTIME-INR  LACTIC ACID, PLASMA   PROCEDURES: Critical Care performed: No Procedures MEDICATIONS ORDERED IN ED: Medications - No data to display IMPRESSION / MDM / ASSESSMENT AND PLAN / ED COURSE  I reviewed the triage vital signs and the nursing notes.                             The patient is on the cardiac monitor to evaluate for evidence of arrhythmia and/or significant heart rate changes. Patient's presentation is most consistent with acute presentation with potential threat to life or bodily function. Patient is an 78 year old male that presents with concerns of wound infection to the right anterior knee after an ORIF to this knee on 08/25/2023 Given History, Exam, and Workup I have low suspicion for Necrotizing Fasciitis, Abscess, Osteomyelitis, DVT or other emergent problem as a cause for this presentation.  Rx: Doxycycline  100 mg twice daily x10 days  Disposition:  Discharge. No evidence of serious bacterial illness. Nontoxic appearing, VSS. Low risk for treatment failure based on history. Strict return precautions discussed with patient with full understanding. Advised patient to follow up promptly with primary care provider within next 48 hours.   FINAL CLINICAL IMPRESSION(S) / ED DIAGNOSES   Final diagnoses:  Wound infection  Cellulitis of right lower extremity   Rx / DC Orders   ED Discharge Orders          Ordered    doxycycline  (VIBRA -TABS) 100 MG tablet  2 times daily        09/24/23 1658           Note:  This document was prepared using Dragon voice recognition software and may include unintentional dictation errors.   Manvi Guilliams K, MD 09/24/23 [phone]

## 2023-10-02 ENCOUNTER — Emergency Department
Admission: EM | Admit: 2023-10-02 | Discharge: 2023-10-02 | Disposition: A | Discharge status: Home / Self Care | Attending: Emergency Medicine | Admitting: Emergency Medicine

## 2023-10-02 ENCOUNTER — Other Ambulatory Visit: Payer: Self-pay

## 2023-10-02 DIAGNOSIS — Z48817 Encounter for surgical aftercare following surgery on the skin and subcutaneous tissue: Secondary | ICD-10-CM | POA: Insufficient documentation

## 2023-10-02 DIAGNOSIS — J449 Chronic obstructive pulmonary disease, unspecified: Secondary | ICD-10-CM | POA: Diagnosis not present

## 2023-10-02 DIAGNOSIS — Z4889 Encounter for other specified surgical aftercare: Secondary | ICD-10-CM

## 2023-10-02 DIAGNOSIS — I129 Hypertensive chronic kidney disease with stage 1 through stage 4 chronic kidney disease, or unspecified chronic kidney disease: Secondary | ICD-10-CM | POA: Diagnosis not present

## 2023-10-02 DIAGNOSIS — N189 Chronic kidney disease, unspecified: Secondary | ICD-10-CM | POA: Insufficient documentation

## 2023-10-02 DIAGNOSIS — E119 Type 2 diabetes mellitus without complications: Secondary | ICD-10-CM | POA: Insufficient documentation

## 2023-10-02 NOTE — ED Notes (Signed)
 Right lower leg wound wrapped.

## 2023-10-02 NOTE — ED Triage Notes (Signed)
 First Nurse Note:  Pt via [facility] from home. Pt c/o R leg pain and swelling states he was seen on 8/20 for same. Pt is A&Ox4 and NAD.  146/82 BP  70 HR  94% on 2L [place] chronically  134 CBG, with hx of DM

## 2023-10-02 NOTE — ED Provider Notes (Signed)
   [facility] Provider Note    Event Date/Time   First MD Initiated Contact with Patient 10/02/23 1335     (approximate)   History   Wound check   HPI  Joe Coleman is a 78 y.o. male with extensive past medical history including chronic kidney disease, diabetes, hypertension, COPD who presents for wound evaluation.  Slowly healing wounds to the right knee status post right periprosthetic tibia fracture.  He denies fevers, was seen here 8 days ago, was given antibiotics which she reports he takes     Physical Exam   Triage Vital Signs: ED Triage Vitals [10/02/23 1307]  Encounter Vitals Group     BP 107/69     Girls Systolic BP Percentile      Girls Diastolic BP Percentile      Boys Systolic BP Percentile      Boys Diastolic BP Percentile      Pulse Rate 94     Resp 16     Temp 97.9 F (36.6 C)     Temp Source Oral     SpO2 100 %     Weight      Height      Head Circumference      Peak Flow      Pain Score 0     Pain Loc      Pain Education      Exclude from Growth Chart     Most recent vital signs: Vitals:   10/02/23 1307  BP: 107/69  Pulse: 94  Resp: 16  Temp: 97.9 F (36.6 C)  SpO2: 100%     General: Awake, no distress.  CV:  Good peripheral perfusion.  Resp:  Normal effort.  Abd:  No distention.  Other:    Overall wounds are well-appearing, no signs of infection,   ED Results / Procedures / Treatments   Labs (all labs ordered are listed, but only abnormal results are displayed) Labs Reviewed - No data to display   EKG     RADIOLOGY     PROCEDURES:  Critical Care performed:   Procedures   MEDICATIONS ORDERED IN ED: Medications - No data to display   IMPRESSION / MDM / ASSESSMENT AND PLAN / ED COURSE  I reviewed the triage vital signs and the nursing notes. Patient's presentation is most consistent with acute, uncomplicated illness.  Wounds appear to be healing, no signs of surrounding  cellulitis or abscess.  Will change dressing, appropriate for discharge with outpatient follow-up with wound clinic        FINAL CLINICAL IMPRESSION(S) / ED DIAGNOSES   Final diagnoses:  Encounter for postoperative wound check     Rx / DC Orders   ED Discharge Orders     None        Note:  This document was prepared using Dragon voice recognition software and may include unintentional dictation errors.   Arlander Lamar, MD 10/02/23 [phone]

## 2023-10-14 ENCOUNTER — Inpatient Hospital Stay (HOSPITAL_COMMUNITY)
Admission: EM | Admit: 2023-10-14 | Discharge: 2023-10-24 | DRG: 464 | Disposition: A | Discharge status: Home Health | Attending: Internal Medicine | Admitting: Internal Medicine

## 2023-10-14 ENCOUNTER — Other Ambulatory Visit: Payer: Self-pay

## 2023-10-14 ENCOUNTER — Encounter (HOSPITAL_COMMUNITY): Payer: Self-pay

## 2023-10-14 ENCOUNTER — Emergency Department (HOSPITAL_COMMUNITY)

## 2023-10-14 DIAGNOSIS — N1832 Chronic kidney disease, stage 3b: Secondary | ICD-10-CM | POA: Diagnosis present

## 2023-10-14 DIAGNOSIS — I82442 Acute embolism and thrombosis of left tibial vein: Secondary | ICD-10-CM | POA: Diagnosis not present

## 2023-10-14 DIAGNOSIS — Y831 Surgical operation with implant of artificial internal device as the cause of abnormal reaction of the patient, or of later complication, without mention of misadventure at the time of the procedure: Secondary | ICD-10-CM | POA: Diagnosis present

## 2023-10-14 DIAGNOSIS — Z9981 Dependence on supplemental oxygen: Secondary | ICD-10-CM | POA: Diagnosis not present

## 2023-10-14 DIAGNOSIS — Z85038 Personal history of other malignant neoplasm of large intestine: Secondary | ICD-10-CM

## 2023-10-14 DIAGNOSIS — B9689 Other specified bacterial agents as the cause of diseases classified elsewhere: Secondary | ICD-10-CM | POA: Diagnosis present

## 2023-10-14 DIAGNOSIS — D631 Anemia in chronic kidney disease: Secondary | ICD-10-CM | POA: Diagnosis present

## 2023-10-14 DIAGNOSIS — L089 Local infection of the skin and subcutaneous tissue, unspecified: Secondary | ICD-10-CM | POA: Diagnosis not present

## 2023-10-14 DIAGNOSIS — J4489 Other specified chronic obstructive pulmonary disease: Secondary | ICD-10-CM | POA: Diagnosis present

## 2023-10-14 DIAGNOSIS — I129 Hypertensive chronic kidney disease with stage 1 through stage 4 chronic kidney disease, or unspecified chronic kidney disease: Secondary | ICD-10-CM | POA: Diagnosis present

## 2023-10-14 DIAGNOSIS — Z8616 Personal history of COVID-19: Secondary | ICD-10-CM | POA: Diagnosis not present

## 2023-10-14 DIAGNOSIS — F17210 Nicotine dependence, cigarettes, uncomplicated: Secondary | ICD-10-CM | POA: Diagnosis not present

## 2023-10-14 DIAGNOSIS — T8130XA Disruption of wound, unspecified, initial encounter: Secondary | ICD-10-CM | POA: Diagnosis present

## 2023-10-14 DIAGNOSIS — Z91014 Allergy to mammalian meats: Secondary | ICD-10-CM | POA: Diagnosis not present

## 2023-10-14 DIAGNOSIS — Z8249 Family history of ischemic heart disease and other diseases of the circulatory system: Secondary | ICD-10-CM | POA: Diagnosis not present

## 2023-10-14 DIAGNOSIS — Z85028 Personal history of other malignant neoplasm of stomach: Secondary | ICD-10-CM

## 2023-10-14 DIAGNOSIS — S82891D Other fracture of right lower leg, subsequent encounter for closed fracture with routine healing: Secondary | ICD-10-CM | POA: Diagnosis not present

## 2023-10-14 DIAGNOSIS — T84622D Infection and inflammatory reaction due to internal fixation device of right tibia, subsequent encounter: Secondary | ICD-10-CM

## 2023-10-14 DIAGNOSIS — E875 Hyperkalemia: Secondary | ICD-10-CM | POA: Diagnosis present

## 2023-10-14 DIAGNOSIS — E1169 Type 2 diabetes mellitus with other specified complication: Secondary | ICD-10-CM

## 2023-10-14 DIAGNOSIS — S81001D Unspecified open wound, right knee, subsequent encounter: Secondary | ICD-10-CM | POA: Diagnosis not present

## 2023-10-14 DIAGNOSIS — B961 Klebsiella pneumoniae [K. pneumoniae] as the cause of diseases classified elsewhere: Secondary | ICD-10-CM | POA: Diagnosis present

## 2023-10-14 DIAGNOSIS — Z7901 Long term (current) use of anticoagulants: Secondary | ICD-10-CM

## 2023-10-14 DIAGNOSIS — E119 Type 2 diabetes mellitus without complications: Secondary | ICD-10-CM | POA: Diagnosis not present

## 2023-10-14 DIAGNOSIS — Z7984 Long term (current) use of oral hypoglycemic drugs: Secondary | ICD-10-CM | POA: Diagnosis not present

## 2023-10-14 DIAGNOSIS — I82452 Acute embolism and thrombosis of left peroneal vein: Secondary | ICD-10-CM | POA: Diagnosis not present

## 2023-10-14 DIAGNOSIS — T8131XA Disruption of external operation (surgical) wound, not elsewhere classified, initial encounter: Secondary | ICD-10-CM | POA: Diagnosis present

## 2023-10-14 DIAGNOSIS — S81001A Unspecified open wound, right knee, initial encounter: Secondary | ICD-10-CM | POA: Diagnosis not present

## 2023-10-14 DIAGNOSIS — S81001S Unspecified open wound, right knee, sequela: Secondary | ICD-10-CM

## 2023-10-14 DIAGNOSIS — Z87891 Personal history of nicotine dependence: Secondary | ICD-10-CM

## 2023-10-14 DIAGNOSIS — Z860101 Personal history of adenomatous and serrated colon polyps: Secondary | ICD-10-CM

## 2023-10-14 DIAGNOSIS — Z79899 Other long term (current) drug therapy: Secondary | ICD-10-CM | POA: Diagnosis not present

## 2023-10-14 DIAGNOSIS — Z888 Allergy status to other drugs, medicaments and biological substances status: Secondary | ICD-10-CM | POA: Diagnosis not present

## 2023-10-14 DIAGNOSIS — T84622A Infection and inflammatory reaction due to internal fixation device of right tibia, initial encounter: Secondary | ICD-10-CM | POA: Diagnosis present

## 2023-10-14 DIAGNOSIS — B9629 Other Escherichia coli [E. coli] as the cause of diseases classified elsewhere: Secondary | ICD-10-CM | POA: Diagnosis not present

## 2023-10-14 DIAGNOSIS — E1122 Type 2 diabetes mellitus with diabetic chronic kidney disease: Secondary | ICD-10-CM | POA: Diagnosis present

## 2023-10-14 DIAGNOSIS — R609 Edema, unspecified: Secondary | ICD-10-CM | POA: Diagnosis not present

## 2023-10-14 DIAGNOSIS — R053 Chronic cough: Secondary | ICD-10-CM | POA: Diagnosis not present

## 2023-10-14 DIAGNOSIS — T84622S Infection and inflammatory reaction due to internal fixation device of right tibia, sequela: Secondary | ICD-10-CM | POA: Diagnosis not present

## 2023-10-14 DIAGNOSIS — Z7951 Long term (current) use of inhaled steroids: Secondary | ICD-10-CM | POA: Diagnosis not present

## 2023-10-14 DIAGNOSIS — Z96651 Presence of right artificial knee joint: Secondary | ICD-10-CM | POA: Diagnosis present

## 2023-10-14 DIAGNOSIS — T8459XA Infection and inflammatory reaction due to other internal joint prosthesis, initial encounter: Secondary | ICD-10-CM | POA: Diagnosis not present

## 2023-10-14 DIAGNOSIS — R0989 Other specified symptoms and signs involving the circulatory and respiratory systems: Secondary | ICD-10-CM | POA: Diagnosis not present

## 2023-10-14 DIAGNOSIS — N183 Chronic kidney disease, stage 3 unspecified: Secondary | ICD-10-CM | POA: Diagnosis not present

## 2023-10-14 DIAGNOSIS — I739 Peripheral vascular disease, unspecified: Secondary | ICD-10-CM | POA: Diagnosis not present

## 2023-10-14 LAB — COMPREHENSIVE METABOLIC PANEL WITH GFR
ALT: 9 U/L (ref 0–44)
AST: 25 U/L (ref 15–41)
Albumin: 2.9 g/dL — ABNORMAL LOW (ref 3.5–5.0)
Alkaline Phosphatase: 104 U/L (ref 38–126)
Anion gap: 10 (ref 5–15)
BUN: 24 mg/dL — ABNORMAL HIGH (ref 8–23)
CO2: 20 mmol/L — ABNORMAL LOW (ref 22–32)
Calcium: 8.8 mg/dL — ABNORMAL LOW (ref 8.9–10.3)
Chloride: 106 mmol/L (ref 98–111)
Creatinine, Ser: 1.62 mg/dL — ABNORMAL HIGH (ref 0.61–1.24)
GFR, Estimated: 43 mL/min — ABNORMAL LOW (ref >60)
Glucose, Bld: 111 mg/dL — ABNORMAL HIGH (ref 70–99)
Potassium: 4.9 mmol/L (ref 3.5–5.1)
Sodium: 136 mmol/L (ref 135–145)
Total Bilirubin: 0.5 mg/dL (ref 0.0–1.2)
Total Protein: 6.5 g/dL (ref 6.5–8.1)

## 2023-10-14 LAB — CBC WITH DIFFERENTIAL/PLATELET
Abs Immature Granulocytes: 0.09 K/uL — ABNORMAL HIGH (ref 0.00–0.07)
Basophils Absolute: 0.1 K/uL (ref 0.0–0.1)
Basophils Relative: 1 %
Eosinophils Absolute: 0.4 K/uL (ref 0.0–0.5)
Eosinophils Relative: 5 %
HCT: 39.1 % (ref 39.0–52.0)
Hemoglobin: 11.3 g/dL — ABNORMAL LOW (ref 13.0–17.0)
Immature Granulocytes: 1 %
Lymphocytes Relative: 13 %
Lymphs Abs: 1.1 K/uL (ref 0.7–4.0)
MCH: 27.4 pg (ref 26.0–34.0)
MCHC: 28.9 g/dL — ABNORMAL LOW (ref 30.0–36.0)
MCV: 94.7 fL (ref 80.0–100.0)
Monocytes Absolute: 0.8 K/uL (ref 0.1–1.0)
Monocytes Relative: 9 %
Neutro Abs: 6.3 K/uL (ref 1.7–7.7)
Neutrophils Relative %: 71 %
Platelets: 247 K/uL (ref 150–400)
RBC: 4.13 MIL/uL — ABNORMAL LOW (ref 4.22–5.81)
RDW: 15.8 % — ABNORMAL HIGH (ref 11.5–15.5)
WBC: 8.7 K/uL (ref 4.0–10.5)
nRBC: 0 % (ref 0.0–0.2)

## 2023-10-14 LAB — GLUCOSE, CAPILLARY
Glucose-Capillary: 92 mg/dL (ref 70–99)
Glucose-Capillary: 139 mg/dL — ABNORMAL HIGH (ref 70–99)

## 2023-10-14 MED ORDER — ONDANSETRON HCL 4 MG/2ML IJ SOLN: 4.0000 mg | Freq: Four times a day (QID) | INTRAMUSCULAR | Status: DC | PRN

## 2023-10-14 MED ORDER — GABAPENTIN 300 MG PO CAPS
300.0000 mg | ORAL_CAPSULE | Freq: Every day | ORAL | Status: DC
  Administered 2023-10-14 – 2023-10-23 (×10): 300 mg via ORAL
  Filled 2023-10-14 (×10): qty 1

## 2023-10-14 MED ORDER — ACETAMINOPHEN 325 MG PO TABS
650.0000 mg | ORAL_TABLET | Freq: Four times a day (QID) | ORAL | Status: DC | PRN
  Administered 2023-10-14: 650 mg via ORAL
  Filled 2023-10-14: qty 2

## 2023-10-14 MED ORDER — POLYETHYLENE GLYCOL 3350 17 G PO PACK
17.0000 g | PACK | Freq: Every day | ORAL | Status: DC | PRN
  Administered 2023-10-19 – 2023-10-20 (×2): 17 g via ORAL
  Filled 2023-10-14 (×2): qty 1

## 2023-10-14 MED ORDER — ONDANSETRON HCL 4 MG PO TABS: 4.0000 mg | ORAL_TABLET | Freq: Four times a day (QID) | ORAL | Status: DC | PRN

## 2023-10-14 MED ORDER — PANTOPRAZOLE SODIUM 40 MG PO TBEC
40.0000 mg | DELAYED_RELEASE_TABLET | Freq: Every day | ORAL | Status: DC
  Administered 2023-10-16 – 2023-10-24 (×9): 40 mg via ORAL
  Filled 2023-10-14 (×9): qty 1

## 2023-10-14 MED ORDER — BISACODYL 5 MG PO TBEC
5.0000 mg | DELAYED_RELEASE_TABLET | Freq: Every day | ORAL | Status: DC | PRN
  Administered 2023-10-18 – 2023-10-20 (×3): 5 mg via ORAL
  Filled 2023-10-14 (×4): qty 1

## 2023-10-14 MED ORDER — VANCOMYCIN HCL 750 MG/150ML IV SOLN
750.0000 mg | INTRAVENOUS | Status: DC
  Administered 2023-10-15 – 2023-10-16 (×2): 750 mg via INTRAVENOUS
  Filled 2023-10-14 (×3): qty 150

## 2023-10-14 MED ORDER — BUDESON-GLYCOPYRROL-FORMOTEROL 160-9-4.8 MCG/ACT IN AERO
2.0000 | INHALATION_SPRAY | Freq: Two times a day (BID) | RESPIRATORY_TRACT | Status: DC
  Administered 2023-10-14 – 2023-10-24 (×18): 2 via RESPIRATORY_TRACT
  Filled 2023-10-14: qty 5.9

## 2023-10-14 MED ORDER — LACTATED RINGERS IV SOLN: INTRAVENOUS | Status: AC

## 2023-10-14 MED ORDER — ALBUTEROL SULFATE (2.5 MG/3ML) 0.083% IN NEBU
3.0000 mL | INHALATION_SOLUTION | Freq: Four times a day (QID) | RESPIRATORY_TRACT | Status: DC | PRN
  Administered 2023-10-19: 3 mL via RESPIRATORY_TRACT
  Filled 2023-10-14: qty 3

## 2023-10-14 MED ORDER — OXYCODONE HCL 5 MG PO TABS: 5.0000 mg | ORAL_TABLET | ORAL | Status: DC | PRN

## 2023-10-14 MED ORDER — ACETAMINOPHEN 650 MG RE SUPP: 650.0000 mg | Freq: Four times a day (QID) | RECTAL | Status: DC | PRN

## 2023-10-14 MED ORDER — NICOTINE 14 MG/24HR TD PT24: 14.0000 mg | MEDICATED_PATCH | Freq: Every day | TRANSDERMAL | Status: DC | PRN

## 2023-10-14 MED ORDER — HEPARIN SODIUM (PORCINE) 5000 UNIT/ML IJ SOLN
5000.0000 [IU] | Freq: Three times a day (TID) | INTRAMUSCULAR | Status: DC
Start: 2023-10-14 — End: 2023-10-15
  Administered 2023-10-14: 5000 [IU] via SUBCUTANEOUS
  Filled 2023-10-14: qty 1

## 2023-10-14 MED ORDER — HYDRALAZINE HCL 20 MG/ML IJ SOLN: 5.0000 mg | INTRAMUSCULAR | Status: DC | PRN

## 2023-10-14 MED ORDER — VANCOMYCIN HCL 1250 MG/250ML IV SOLN
1250.0000 mg | Freq: Once | INTRAVENOUS | Status: AC
  Administered 2023-10-14: 1250 mg via INTRAVENOUS
  Filled 2023-10-14 (×2): qty 250

## 2023-10-14 MED ORDER — INSULIN ASPART 100 UNIT/ML IJ SOLN
0.0000 [IU] | INTRAMUSCULAR | Status: DC | PRN
  Administered 2023-10-18: 2 [IU] via SUBCUTANEOUS

## 2023-10-14 MED ORDER — MORPHINE SULFATE (PF) 2 MG/ML IV SOLN
2.0000 mg | INTRAVENOUS | Status: DC | PRN
  Administered 2023-10-15 (×2): 2 mg via INTRAVENOUS
  Filled 2023-10-14 (×2): qty 1

## 2023-10-14 MED ORDER — OXYCODONE HCL 5 MG PO TABS
2.5000 mg | ORAL_TABLET | ORAL | Status: DC | PRN
Start: 2023-10-14 — End: 2023-10-16
  Administered 2023-10-14 – 2023-10-16 (×4): 5 mg via ORAL
  Filled 2023-10-14 (×4): qty 1

## 2023-10-14 MED ORDER — SODIUM CHLORIDE 0.9 % IV SOLN
2.0000 g | Freq: Two times a day (BID) | INTRAVENOUS | Status: DC
  Administered 2023-10-14 – 2023-10-19 (×10): 2 g via INTRAVENOUS
  Filled 2023-10-14 (×10): qty 12.5

## 2023-10-14 MED ORDER — CEFAZOLIN SODIUM-DEXTROSE 2-4 GM/100ML-% IV SOLN
2.0000 g | Freq: Once | INTRAVENOUS | Status: AC
  Administered 2023-10-14: 2 g via INTRAVENOUS
  Filled 2023-10-14: qty 100

## 2023-10-14 NOTE — Progress Notes (Signed)
 Pt arrived to 6 north room 13 alert and oriented x4. Pain level 4/10 in right knee. Right knee open wound with harware visible. Covered wound with gauze and tape. Bed in lowest position. Call light in reach. Bed alarm on. All needs met at this time

## 2023-10-14 NOTE — Anesthesia Preprocedure Evaluation (Signed)
 Anesthesia Evaluation  Patient identified by MRN, date of birth, ID band Patient awake    Reviewed: Allergy & Precautions, H&P , NPO status , Patient's Chart, lab work & pertinent test results  History of Anesthesia Complications Negative for: history of anesthetic complications  Airway Mallampati: II  TM Distance: >3 FB Neck ROM: Full    Dental  (+) Edentulous Upper, Poor Dentition, Missing   Pulmonary asthma , COPD, former smoker   Pulmonary exam normal breath sounds clear to auscultation       Cardiovascular hypertension, Normal cardiovascular exam Rhythm:Regular Rate:Normal     Neuro/Psych  PSYCHIATRIC DISORDERS  Depression    negative neurological ROS     GI/Hepatic Neg liver ROS,,,Stomach / rectosigmoid cancer   Endo/Other  diabetes, Type 2    Renal/GU negative Renal ROS  negative genitourinary   Musculoskeletal Hx of Tibial fracture s/p ORIF now infected   Abdominal   Peds negative pediatric ROS (+)  Hematology  (+) Blood dyscrasia, anemia   Anesthesia Other Findings   Reproductive/Obstetrics negative OB ROS                              Anesthesia Physical Anesthesia Plan  ASA: 3  Anesthesia Plan:    Post-op Pain Management: Tylenol  PO (pre-op)*   Induction: Intravenous  PONV Risk Score and Plan: 2 and Ondansetron , Dexamethasone  and Treatment may vary due to age or medical condition  Airway Management Planned: Oral ETT  Additional Equipment: None  Intra-op Plan:   Post-operative Plan: Extubation in OR  Informed Consent: I have reviewed the patients History and Physical, chart, labs and discussed the procedure including the risks, benefits and alternatives for the proposed anesthesia with the patient or authorized representative who has indicated his/her understanding and acceptance.     Dental advisory given  Plan Discussed with: CRNA  Anesthesia Plan  Comments:          Anesthesia Quick Evaluation

## 2023-10-14 NOTE — Progress Notes (Signed)
 Pharmacy Antibiotic Note  Joe Coleman is a 78 y.o. male admitted on 10/14/2023 with cellulitis.  Pharmacy has been consulted for vancomycin  and Cefepime  dosing.  Historic wt from 06/23/23 used for Vanc dosing  Plan: Vancomycin  1250 mg load x1 in ED Vancomycin  750 mg Q24H x7 days - eAUC 487 Cefepime  2 g Q12H x7 days  Scr 1.62. PMH CKD Stage 3. Monitor renal function Vanc levels as needed FU 9/9 blood cultures  Height: 5' 8 (172.7 cm) Weight: 65.8 kg (145 lb) IBW/kg (Calculated) : 68.4  Temp (24hrs), Avg:98.2 F (36.8 C), Min:98.1 F (36.7 C), Max:98.2 F (36.8 C)  Recent Labs  Lab 10/14/23 1445  WBC 8.7  CREATININE 1.62*    Estimated Creatinine Clearance: 33.8 mL/min (A) (by C-G formula based on SCr of 1.62 mg/dL (H)).    Allergies  Allergen Reactions   Chicken Allergy Nausea And Vomiting        Losartan     Hyperkalemia (>5.5 multiple times on losartan 25mg )   Poultry Meal Nausea And Vomiting    Antimicrobials this admission: 9/9 Cefazolin  x1 9/9 Vanc >> Current 9/9 Cefepime  >> Current  Dose adjustments this admission: Cefepime  dose adjusted for CrCl 30-60  Microbiology results: 9/9 BCx: In Progress   Thank you for allowing pharmacy to be a part of this patient's care.  Prentice DOROTHA Favors, PharmD PGY1 Health-System Pharmacy Administration and Leadership Resident [facility]  10/14/2023 5:00 PM

## 2023-10-14 NOTE — ED Triage Notes (Signed)
 Patient BIB  EMS from home for surgical wound dehiscence since visit to [facility] on 10-02-23. Patient wound is open, draining, and surgical prosthesis is visible. Patient reports compliance with doxycycline  and pain meds, new home health nurse today assessed site and called EMS immediately.  BP 136/70 HR 96 97.38F CBG 160 100% RA RR 16 ETCo2 32
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
## 2023-10-14 NOTE — Progress Notes (Signed)
 Pt home meds taken to pharmacy Methocarbamol  10 Oxycodone  25 Doxycycline  13 Gabapentin  73 Hydrocodone-acetaminophen  21
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
## 2023-10-14 NOTE — ED Provider Notes (Signed)
 [facility] [address]  SURGICAL Provider Note  CSN: [id number] Arrival date & time: 10/14/23 1218  Chief Complaint(s) Post-op Problem  HPI DAVONE SHINAULT is a 78 y.o. male with past medical history as below, significant for DM, HTN, COPD, CKD who presents to the ED with complaint of leg wound  Patient is status post open reduction internal fixation of right proximal tibia and right tibial shaft fracture on 7/21 [name].  He has had some slow healing wound since then, he has a wound care nurse that comes to the house to evaluate him.  He was seen at [facility] on 8/20 and 8/28 with ongoing concern for slow healing wounds.  No overt signs of infection at that time and was discharged home with close outpatient follow-up.  Patient reports this morning he was attempting to clean self dressed he noticed that the wound on his right knee had opened and there was some pus coming out of it.  EMS was dispatched and he arrives today for further evaluation.  He has no c/o pain to his right knee, he had a fall around a week ago but does not feel as though he injured his knee at that time.  Fell onto his buttock.  Denies any fevers, chills, nausea or vomiting.  Haddix 08/25/23  1. CPT 27536-Open reduction internal fixation of right proximal tibia fracture 2. CPT 27758-Open reduction internal fixation of right tibial shaft fracture      Past Medical History Past Medical History:  Diagnosis Date   Cancer (HCC)    stomach and colon   Diabetes mellitus without complication (HCC)    Type II   Hypertension    Patient Active Problem List   Diagnosis Date Noted   Open knee wound, right, sequela 10/14/2023   Malnutrition of moderate degree 08/30/2023   Tibia/fibula fracture 08/24/2023   Hypotension 08/24/2023   Hyperkalemia 08/24/2023   Cellulitis of right arm 08/10/2023   Eye pain, bilateral 07/26/2022   Fall 07/26/2022   Hyponatremia 07/26/2022   Lymphopenia 07/26/2022   Shortness  of breath 05/21/2022   AKI (acute kidney injury) (HCC) 03/17/2020   COVID-19 virus infection 03/17/2020   COPD with acute exacerbation (HCC) 03/17/2020   Type 2 diabetes mellitus (HCC) 03/17/2020   HTN (hypertension) 03/17/2020   B12 deficiency 10/05/2019   Erectile dysfunction 11/10/2017   Chronic kidney disease (CKD), stage III (moderate) (HCC) 09/19/2017   Mild intermittent asthma without complication 09/19/2017   Chronic cough 10/22/2016   Epigastric pain 06/30/2015   Chronic bilateral low back pain without sciatica 05/31/2015   Neuropathic pain 03/01/2015   Benign prostatic hyperplasia with lower urinary tract symptoms 04/06/2014   Adenomatous colon polyp 08/31/2013   Depression 06/21/2013   Foot pain, bilateral 05/07/2013   Insomnia 03/24/2013   Vitiligo 12/23/2012   Primary localized osteoarthrosis, lower leg 01/15/2011   Pain medication agreement broken 09/19/2010   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 Authorizing Provider  albuterol  (PROVENTIL  HFA;VENTOLIN  HFA) 108 (90 Base) MCG/ACT inhaler Inhale 2 puffs into the lungs every 6 (six) hours as needed for wheezing or shortness of breath. 04/01/16  Yes Lang Dover, MD  cholecalciferol  (CHOLECALCIFEROL ) 25 MCG tablet Take 1 tablet (1,000 Units total) by mouth daily. 08/28/23  Yes Danton Lauraine LABOR, PA-C  doxycycline  (  VIBRA -TABS) 100 MG tablet Take 100 mg by mouth 2 (two) times daily. 10/07/23  Yes [provider]  gabapentin  (NEURONTIN ) 300 MG capsule Take 300 mg by mouth at bedtime.   Yes [provider]  HYDROcodone-acetaminophen  (NORCO/VICODIN) 5-325 MG tablet Take 1 tablet by mouth every 8 (eight) hours as needed. 10/07/23  Yes [provider]  metFORMIN  (GLUCOPHAGE ) 500 MG tablet Take 500  mg by mouth 2 (two) times daily with a meal.   Yes [provider]  methocarbamol  (ROBAXIN ) 500 MG tablet Take 1 tablet (500 mg total) by mouth every 6 (six) hours as needed for muscle spasms. 08/27/23  Yes Danton Lauraine LABOR, PA-C  oxyCODONE  (ROXICODONE ) 5 MG immediate release tablet Take 0.5-1 tablets (2.5-5 mg total) by mouth every 4 (four) hours as needed for moderate pain (pain score 4-6) or severe pain (pain score 7-10). 08/27/23  Yes McClung, Sarah A, PA-C  pantoprazole  (PROTONIX ) 40 MG tablet Take 40 mg by mouth daily.   Yes [provider]  polyethylene glycol (MIRALAX  / GLYCOLAX ) 17 g packet Take 17 g by mouth daily. 09/01/23  Yes Sheikh, Omair Latif, DO  senna-docusate (SENOKOT-S) 8.6-50 MG tablet Take 1 tablet by mouth at bedtime. 09/01/23  Yes Sheikh, Omair Latif, DO  TRELEGY ELLIPTA 200-62.5-25 MCG/ACT AEPB Inhale 1 puff into the lungs daily. 07/17/23  Yes [provider]  feeding supplement, GLUCERNA SHAKE, (GLUCERNA SHAKE) LIQD Take 237 mLs by mouth 3 (three) times daily between meals. 09/01/23   Sherrill Cable Latif, DO  ipratropium-albuterol  (DUONEB) 0.5-2.5 (3) MG/3ML SOLN Take 3 mLs by nebulization every 4 (four) hours as needed. Patient not taking: Reported on 10/14/2023 09/01/23   Sherrill Cable Donovan, DO                                                                                                                                    Past Surgical History Past Surgical History:  Procedure Laterality Date   APPENDECTOMY     COLON SURGERY     HARDWARE REMOVAL Right 10/15/2023   Procedure: REMOVAL, HARDWARE;  Surgeon: [name] Franky SQUIBB, MD;  Location: [facility] OR;  Service: Orthopedics;  Laterality: Right;  IRRIGATION AND DEBRIDEMENT, HARDWARE REMOVAL   ORIF TIBIA FRACTURE Right 08/25/2023   Procedure: OPEN REDUCTION INTERNAL FIXATION (ORIF) TIBIA FRACTURE, RIGHT;  Surgeon: [name] Franky SQUIBB, MD;  Location: [facility] OR;  Service: Orthopedics;  Laterality: Right;   Family  History Family History  Problem Relation Age of Onset   Heart disease Mother    Heart disease Brother     Social History Social History   Tobacco Use   Smoking status: Former   Smokeless tobacco: Never  Advertising account planner   Vaping status: Never Used  Substance Use Topics   Alcohol use: No   Drug use: No   Allergies Losartan, Chicken allergy, and Poultry meal  Review of Systems A thorough review of systems was  obtained and all systems are negative except as noted in the HPI and PMH.   Physical Exam Vital Signs  I have reviewed the triage vital signs BP 119/72   Pulse 69   Temp (!) 97.5 F (36.4 C) (Oral)   Resp 17   Ht 5' 8 (1.727 m)   Wt 65.8 kg   SpO2 99%   BMI 22.05 kg/m  Physical Exam Vitals and nursing note reviewed.  Constitutional:      General: He is not in acute distress.    Appearance: Normal appearance. He is well-developed. He is not ill-appearing.  HENT:     Head: Normocephalic and atraumatic.     Right Ear: External ear normal.     Left Ear: External ear normal.     Nose: Nose normal.     Mouth/Throat:     Mouth: Mucous membranes are moist.  Eyes:     General: No scleral icterus.       Right eye: No discharge.        Left eye: No discharge.  Cardiovascular:     Rate and Rhythm: Normal rate.  Pulmonary:     Effort: Pulmonary effort is normal. No respiratory distress.     Breath sounds: No stridor. Wheezing present.     Comments: Trace wheeze Abdominal:     General: Abdomen is flat. There is no distension.     Tenderness: There is no guarding.  Musculoskeletal:        General: No deformity.     Cervical back: No rigidity.       Legs:  Skin:    General: Skin is warm and dry.     Coloration: Skin is not cyanotic, jaundiced or pale.  Neurological:     Mental Status: He is alert and oriented to person, place, and time.     GCS: GCS eye subscore is 4. GCS verbal subscore is 5. GCS motor subscore is 6.  Psychiatric:        Speech: Speech  normal.        Behavior: Behavior normal. Behavior is cooperative.     ED Results and Treatments Labs (all labs ordered are listed, but only abnormal results are displayed) Labs Reviewed  CBC WITH DIFFERENTIAL/PLATELET - Abnormal; Notable for the following components:      Result Value   RBC 4.13 (*)    Hemoglobin 11.3 (*)    MCHC 28.9 (*)    RDW 15.8 (*)    Abs Immature Granulocytes 0.09 (*)    All other components within normal limits  COMPREHENSIVE METABOLIC PANEL WITH GFR - Abnormal; Notable for the following components:   CO2 20 (*)    Glucose, Bld 111 (*)    BUN 24 (*)    Creatinine, Ser 1.62 (*)    Calcium 8.8 (*)    Albumin  2.9 (*)    GFR, Estimated 43 (*)    All other components within normal limits  GLUCOSE, CAPILLARY - Abnormal; Notable for the following components:   Glucose-Capillary 139 (*)    All other components within normal limits  GLUCOSE, CAPILLARY - Abnormal; Notable for the following components:   Glucose-Capillary 103 (*)    All other components within normal limits  GLUCOSE, CAPILLARY - Abnormal; Notable for the following components:   Glucose-Capillary 129 (*)    All other components within normal limits  GLUCOSE, CAPILLARY - Abnormal; Notable for the following components:   Glucose-Capillary 113 (*)    All other components  within normal limits  CBC - Abnormal; Notable for the following components:   RBC 3.35 (*)    Hemoglobin 9.2 (*)    HCT 29.4 (*)    All other components within normal limits  COMPREHENSIVE METABOLIC PANEL WITH GFR - Abnormal; Notable for the following components:   Potassium 5.2 (*)    Glucose, Bld 141 (*)    Creatinine, Ser 1.39 (*)    Calcium 8.6 (*)    Total Protein 5.0 (*)    Albumin  2.1 (*)    AST 14 (*)    GFR, Estimated 51 (*)    All other components within normal limits  MAGNESIUM  - Abnormal; Notable for the following components:   Magnesium  1.5 (*)    All other components within normal limits  GLUCOSE,  CAPILLARY - Abnormal; Notable for the following components:   Glucose-Capillary 196 (*)    All other components within normal limits  GLUCOSE, CAPILLARY - Abnormal; Notable for the following components:   Glucose-Capillary 189 (*)    All other components within normal limits  GLUCOSE, CAPILLARY - Abnormal; Notable for the following components:   Glucose-Capillary 179 (*)    All other components within normal limits  GLUCOSE, CAPILLARY - Abnormal; Notable for the following components:   Glucose-Capillary 139 (*)    All other components within normal limits  GLUCOSE, CAPILLARY - Abnormal; Notable for the following components:   Glucose-Capillary 141 (*)    All other components within normal limits  CULTURE, BLOOD (ROUTINE X 2)  MRSA NEXT GEN BY PCR, NASAL  AEROBIC/ANAEROBIC CULTURE W GRAM STAIN (SURGICAL/DEEP WOUND)  AEROBIC/ANAEROBIC CULTURE W GRAM STAIN (SURGICAL/DEEP WOUND)  CULTURE, BLOOD (ROUTINE X 2)  GLUCOSE, CAPILLARY  GLUCOSE, CAPILLARY  GLUCOSE, CAPILLARY                                                                                                                          Radiology No results found.  Pertinent labs & imaging results that were available during my care of the patient were reviewed by me and considered in my medical decision making (see MDM for details).  Medications Ordered in ED Medications  lactated ringers  infusion ( Intravenous New Bag/Given 10/15/23 1004)  polyethylene glycol (MIRALAX  / GLYCOLAX ) packet 17 g ( Oral MAR Unhold 10/15/23 1128)  bisacodyl  (DULCOLAX) EC tablet 5 mg ( Oral MAR Unhold 10/15/23 1128)  ondansetron  (ZOFRAN ) tablet 4 mg ( Oral MAR Unhold 10/15/23 1128)    Or  ondansetron  (ZOFRAN ) injection 4 mg ( Intravenous MAR Unhold 10/15/23 1128)  nicotine  (NICODERM CQ  - dosed in mg/24 hours) patch 14 mg ( Transdermal MAR Unhold 10/15/23 1128)  hydrALAZINE  (APRESOLINE ) injection 5 mg ( Intravenous MAR Unhold 10/15/23 1128)  insulin  aspart  (novoLOG ) injection 0-15 Units ( Subcutaneous MAR Unhold 10/15/23 1128)  vancomycin  (VANCOREADY) IVPB 750 mg/150 mL (750 mg Intravenous New Bag/Given 10/15/23 1706)  ceFEPIme  (MAXIPIME ) 2 g in sodium chloride  0.9 % 100 mL IVPB (2 g Intravenous  New Bag/Given 10/16/23 0501)  albuterol  (PROVENTIL ) (2.5 MG/3ML) 0.083% nebulizer solution 3 mL ( Inhalation MAR Unhold 10/15/23 1128)  gabapentin  (NEURONTIN ) capsule 300 mg (300 mg Oral Given 10/15/23 2058)  pantoprazole  (PROTONIX ) EC tablet 40 mg (40 mg Oral Given 10/16/23 0934)  budesonide -glycopyrrolate -formoterol  (BREZTRI ) 160-9-4.8 MCG/ACT inhaler 2 puff (2 puffs Inhalation Given 10/16/23 0834)  dextrose  5 % in lactated ringers  infusion ( Intravenous New Bag/Given 10/15/23 1142)  morphine  (PF) 2 MG/ML injection 1-2 mg (has no administration in time range)  acetaminophen  (TYLENOL ) tablet 650 mg (650 mg Oral Given 10/16/23 1237)    Or  acetaminophen  (TYLENOL ) suppository 650 mg ( Rectal See Alternative 10/16/23 1237)  methocarbamol  (ROBAXIN ) tablet 500 mg (has no administration in time range)    Or  methocarbamol  (ROBAXIN ) injection 500 mg (has no administration in time range)  docusate sodium  (COLACE) capsule 100 mg (100 mg Oral Given 10/16/23 0934)  metoCLOPramide  (REGLAN ) tablet 5-10 mg (has no administration in time range)    Or  metoCLOPramide  (REGLAN ) injection 5-10 mg (has no administration in time range)  diphenhydrAMINE  (BENADRYL ) 12.5 MG/5ML elixir 12.5-25 mg (has no administration in time range)  aspirin  tablet 325 mg (325 mg Oral Given 10/16/23 0934)  oxyCODONE  (Oxy IR/ROXICODONE ) immediate release tablet 5-10 mg (10 mg Oral Given 10/16/23 0934)  ceFAZolin  (ANCEF ) IVPB 2g/100 mL premix (0 g Intravenous Stopped 10/14/23 1525)  chlorhexidine  (HIBICLENS ) 4 % liquid 4 Application (4 Applications Topical Given 10/15/23 0619)  vancomycin  (VANCOREADY) IVPB 1250 mg/250 mL (1,250 mg Intravenous New Bag/Given 10/14/23 1844)  chlorhexidine  (PERIDEX ) 0.12 % solution  15 mL (15 mLs Mouth/Throat Given 10/15/23 9271)    Or  Oral care mouth rinse ( Mouth Rinse See Alternative 10/15/23 0728)  magnesium  sulfate IVPB 2 g 50 mL (2 g Intravenous New Bag/Given 10/16/23 0702)  sodium zirconium cyclosilicate  (LOKELMA ) packet 10 g (10 g Oral Given 10/16/23 0703)                                                                                                                                     Procedures Procedures  (including critical care time)  Medical Decision Making / ED Course    Medical Decision Making:    MATHESON VANDEHEI is a 78 y.o. male with past medical history as below, significant for DM, HTN, COPD, CKD who presents to the ED with complaint of leg wound. The complaint involves an extensive differential diagnosis and also carries with it a high risk of complications and morbidity.  Serious etiology was considered. Ddx includes but is not limited to: cellulitis, septic joint, fx, dislocation ,etc  Complete initial physical exam performed, notably the patient was in NAD.    Reviewed and confirmed nursing documentation for past medical history, family history, social history.  Vital signs reviewed.    Wound to RLE w/ exposed surgical hardware w/ purulent drainage > - Ortho surgery 7/10 1/25 [name]  with open reduction internal fixation of right proximal tibia and tibial shaft.  Patient reports wound opened up this morning and has purulent drainage, exposed surgical hardware - Spoke w/ orthopedics, recommend admission to medicine service. See consult noted   Does not meet for sepsis, abx started in ED, blood ctx ordered. Admit medicine service, ortho on consult. Tentative plan for OR tomorrow Dr [name]  Clinical Course as of 10/16/23 1556  Tue Oct 14, 2023  1441 Spoke w/ ortho PA Reyes, will come see, recommend med admit. Surgical planning  [SG]    Clinical Course User Index [SG] Elnor Jayson LABOR, DO      Haddix 08/25/23  1. CPT 27536-Open  reduction internal fixation of right proximal tibia fracture 2. CPT 27758-Open reduction internal fixation of right tibial shaft fracture                 Additional history obtained: -Additional history obtained from ems -External records from outside source obtained and reviewed including: Chart review including previous notes, labs, imaging, consultation notes including  Prior OR note [facility] notes from [facility]   Lab Tests: -I ordered, reviewed, and interpreted labs.   The pertinent results include:   Labs Reviewed  CBC WITH DIFFERENTIAL/PLATELET - Abnormal; Notable for the following components:      Result Value   RBC 4.13 (*)    Hemoglobin 11.3 (*)    MCHC 28.9 (*)    RDW 15.8 (*)    Abs Immature Granulocytes 0.09 (*)    All other components within normal limits  COMPREHENSIVE METABOLIC PANEL WITH GFR - Abnormal; Notable for the following components:   CO2 20 (*)    Glucose, Bld 111 (*)    BUN 24 (*)    Creatinine, Ser 1.62 (*)    Calcium 8.8 (*)    Albumin  2.9 (*)    GFR, Estimated 43 (*)    All other components within normal limits  GLUCOSE, CAPILLARY - Abnormal; Notable for the following components:   Glucose-Capillary 139 (*)    All other components within normal limits  GLUCOSE, CAPILLARY - Abnormal; Notable for the following components:   Glucose-Capillary 103 (*)    All other components within normal limits  GLUCOSE, CAPILLARY - Abnormal; Notable for the following components:   Glucose-Capillary 129 (*)    All other components within normal limits  GLUCOSE, CAPILLARY - Abnormal; Notable for the following components:   Glucose-Capillary 113 (*)    All other components within normal limits  CBC - Abnormal; Notable for the following components:   RBC 3.35 (*)    Hemoglobin 9.2 (*)    HCT 29.4 (*)    All other components within normal limits  COMPREHENSIVE METABOLIC PANEL WITH GFR - Abnormal; Notable for the following components:   Potassium 5.2 (*)     Glucose, Bld 141 (*)    Creatinine, Ser 1.39 (*)    Calcium 8.6 (*)    Total Protein 5.0 (*)    Albumin  2.1 (*)    AST 14 (*)    GFR, Estimated 51 (*)    All other components within normal limits  MAGNESIUM  - Abnormal; Notable for the following components:   Magnesium  1.5 (*)    All other components within normal limits  GLUCOSE, CAPILLARY - Abnormal; Notable for the following components:   Glucose-Capillary 196 (*)    All other components within normal limits  GLUCOSE, CAPILLARY - Abnormal; Notable for the following components:   Glucose-Capillary 189 (*)  All other components within normal limits  GLUCOSE, CAPILLARY - Abnormal; Notable for the following components:   Glucose-Capillary 179 (*)    All other components within normal limits  GLUCOSE, CAPILLARY - Abnormal; Notable for the following components:   Glucose-Capillary 139 (*)    All other components within normal limits  GLUCOSE, CAPILLARY - Abnormal; Notable for the following components:   Glucose-Capillary 141 (*)    All other components within normal limits  CULTURE, BLOOD (ROUTINE X 2)  MRSA NEXT GEN BY PCR, NASAL  AEROBIC/ANAEROBIC CULTURE W GRAM STAIN (SURGICAL/DEEP WOUND)  AEROBIC/ANAEROBIC CULTURE W GRAM STAIN (SURGICAL/DEEP WOUND)  CULTURE, BLOOD (ROUTINE X 2)  GLUCOSE, CAPILLARY  GLUCOSE, CAPILLARY  GLUCOSE, CAPILLARY    Notable for labs stable  EKG   EKG Interpretation Date/Time:    Ventricular Rate:    PR Interval:    QRS Duration:    QT Interval:    QTC Calculation:   R Axis:      Text Interpretation:           Imaging Studies ordered: I ordered imaging studies including knee xr I independently visualized the following imaging with scope of interpretation limited to determining acute life threatening conditions related to emergency care; findings noted above I agree with the radiologist interpretation If any imaging was obtained with contrast I closely monitored patient for any  possible adverse reaction a/w contrast administration in the emergency department   Medicines ordered and prescription drug management: Meds ordered this encounter  Medications   ceFAZolin  (ANCEF ) IVPB 2g/100 mL premix    Antibiotic Indication::   Surgical Prophylaxis   chlorhexidine  (HIBICLENS ) 4 % liquid 4 Application   DISCONTD: povidone-iodine  10 % swab 2 Application   DISCONTD: ceFAZolin  (ANCEF ) IVPB 2g/100 mL premix    Indication::   Surgical Prophylaxis   lactated ringers  infusion   DISCONTD: acetaminophen  (TYLENOL ) tablet 650 mg   DISCONTD: acetaminophen  (TYLENOL ) suppository 650 mg   DISCONTD: oxyCODONE  (Oxy IR/ROXICODONE ) immediate release tablet 5 mg    Refill:  0   DISCONTD: morphine  (PF) 2 MG/ML injection 2 mg   polyethylene glycol (MIRALAX  / GLYCOLAX ) packet 17 g   bisacodyl  (DULCOLAX) EC tablet 5 mg   OR Linked Order Group    ondansetron  (ZOFRAN ) tablet 4 mg    ondansetron  (ZOFRAN ) injection 4 mg   nicotine  (NICODERM CQ  - dosed in mg/24 hours) patch 14 mg   hydrALAZINE  (APRESOLINE ) injection 5 mg   DISCONTD: heparin  injection 5,000 Units   insulin  aspart (novoLOG ) injection 0-15 Units    Correction coverage::   Moderate (average weight, post-op)    CBG < 70::   implement hypoglycemia protocol    CBG 70 - 120::   0 units    CBG 121 - 150::   2 units    CBG 151 - 200::   3 units    CBG 201 - 250::   5 units    CBG 251 - 300::   8 units    CBG 301 - 350::   11 units    CBG 351 - 400::   15 units    CBG > 400:   call MD and obtain STAT lab verification   vancomycin  (VANCOREADY) IVPB 750 mg/150 mL    Indication::   Cellulitis   ceFEPIme  (MAXIPIME ) 2 g in sodium chloride  0.9 % 100 mL IVPB    Antibiotic Indication::   Other Indication (list below)    Other Indication::   Cellulitis   vancomycin  (  VANCOREADY) IVPB 1250 mg/250 mL    Indication::   Cellulitis   albuterol  (PROVENTIL ) (2.5 MG/3ML) 0.083% nebulizer solution 3 mL   gabapentin  (NEURONTIN ) capsule 300 mg    DISCONTD: oxyCODONE  (Oxy IR/ROXICODONE ) immediate release tablet 2.5-5 mg    Refill:  0   pantoprazole  (PROTONIX ) EC tablet 40 mg   budesonide -glycopyrrolate -formoterol  (BREZTRI ) 160-9-4.8 MCG/ACT inhaler 2 puff   DISCONTD: oxyCODONE  (Oxy IR/ROXICODONE ) immediate release tablet 5 mg   DISCONTD: oxyCODONE  (ROXICODONE ) 5 MG/5ML solution 5 mg   DISCONTD: fentaNYL  (SUBLIMAZE ) injection 25-50 mcg   DISCONTD: acetaminophen  (OFIRMEV ) IV 1,000 mg    IV acetaminophen  for PACU patients (adjunct analgesic):   Yes   DISCONTD: droperidol  (INAPSINE ) 2.5 MG/ML injection 0.625 mg   dextrose  5 % in lactated ringers  infusion   DISCONTD: lactated ringers  infusion   OR Linked Order Group    chlorhexidine  (PERIDEX ) 0.12 % solution 15 mL    Oral care mouth rinse   chlorhexidine  (PERIDEX ) 0.12 % solution    Harvell, Carson L: cabinet override   DISCONTD: insulin  aspart (novoLOG ) injection 0-7 Units    Correction coverage::   Periop Sensitive    CBG < 70::   Implement Hypoglycemia Standing Orders and refer to Hypoglycemia Standing Orders sidebar report    CBG 70 - 179::   0 units    CBG 180 - 219::   2 units    CBG 220 - 259::   3 units    CBG 260 - 299::   4 units    CBG 300 - 339::   5 units. Consult anesthesiologist prior to admin    CBG 340 - 379::   6 units. Consult anesthesiologist prior to admin    CBG 380 - 399::   7 units. Consult anesthesiologist prior to admin    CBG >399::   Notify anesthesiologist   DISCONTD: 0.9 % irrigation (POUR BTL)   DISCONTD: sodium chloride  irrigation 0.9 %   DISCONTD: vancomycin  (VANCOCIN ) powder   DISCONTD: tobramycin  (NEBCIN ) powder   morphine  (PF) 2 MG/ML injection 1-2 mg   OR Linked Order Group    acetaminophen  (TYLENOL ) tablet 650 mg    acetaminophen  (TYLENOL ) suppository 650 mg   OR Linked Order Group    methocarbamol  (ROBAXIN ) tablet 500 mg    methocarbamol  (ROBAXIN ) injection 500 mg   docusate sodium  (COLACE) capsule 100 mg   OR Linked Order Group     metoCLOPramide  (REGLAN ) tablet 5-10 mg    metoCLOPramide  (REGLAN ) injection 5-10 mg   diphenhydrAMINE  (BENADRYL ) 12.5 MG/5ML elixir 12.5-25 mg   aspirin  tablet 325 mg   magnesium  sulfate IVPB 2 g 50 mL   sodium zirconium cyclosilicate  (LOKELMA ) packet 10 g   oxyCODONE  (Oxy IR/ROXICODONE ) immediate release tablet 5-10 mg    Refill:  0    -I have reviewed the patients home medicines and have made adjustments as needed   Consultations Obtained: I requested consultation with the ortho,  and discussed lab and imaging findings as well as pertinent plan - they recommend: admit   Cardiac Monitoring: Continuous pulse oximetry interpreted by myself, 99% on RA.    Social Determinants of Health:  Diagnosis or treatment significantly limited by social determinants of health: former smoker   Reevaluation: After the interventions noted above, I reevaluated the patient and found that they have stayed the same  Co morbidities that complicate the patient evaluation  Past Medical History:  Diagnosis Date   Cancer (HCC)    stomach and colon  Diabetes mellitus without complication (HCC)    Type II   Hypertension       Dispostion: Disposition decision including need for hospitalization was considered, and patient will be admitted    Final Clinical Impression(s) / ED Diagnoses Final diagnoses:  Wound dehiscence        Elnor Jayson LABOR, DO 10/16/23 1557

## 2023-10-14 NOTE — H&P (View-Only) (Signed)
 Reason for Consult:Right hardware infection Referring Physician: Jayson Pereyra Time called: 1437 Time at bedside: 1501   Joe Coleman is an 78 y.o. male.  HPI: Joe Coleman developed a wound over his right tibia about 4d ago. It began to drain bloody discharge. He was brought to the ED today for evaluation. Hardware was visible in the wound bed and orthopedic surgery was consulted. He only has pain with it when he bears weight. Denies fevers, chills, sweats, N/V.  Past Medical History:  Diagnosis Date   Cancer (HCC)    stomach and colon   Diabetes mellitus without complication (HCC)    Hypertension     Past Surgical History:  Procedure Laterality Date   APPENDECTOMY     COLON SURGERY     ORIF TIBIA FRACTURE Right 08/25/2023   Procedure: OPEN REDUCTION INTERNAL FIXATION (ORIF) TIBIA FRACTURE, RIGHT;  Surgeon: [name] Franky SQUIBB, MD;  Location: [facility] OR;  Service: Orthopedics;  Laterality: Right;    Family History  Problem Relation Age of Onset   Heart disease Mother    Heart disease Brother     Social History:  reports that he has quit smoking. He has never used smokeless tobacco. He reports that he does not drink alcohol and does not use drugs.  Allergies:  Allergies  Allergen Reactions   Chicken Allergy Nausea And Vomiting        Losartan     Hyperkalemia (>5.5 multiple times on losartan 25mg )   Poultry Meal Nausea And Vomiting    Medications: I have reviewed the patient's current medications.  Results for orders placed or performed during the hospital encounter of 10/14/23 (from the past 48 hours)  CBC with Differential     Status: Abnormal   Collection Time: 10/14/23  2:45 PM  Result Value Ref Range   WBC 8.7 4.0 - 10.5 K/uL   RBC 4.13 (L) 4.22 - 5.81 MIL/uL   Hemoglobin 11.3 (L) 13.0 - 17.0 g/dL   HCT 60.8 60.9 - 47.9 %   MCV 94.7 80.0 - 100.0 fL   MCH 27.4 26.0 - 34.0 pg   MCHC 28.9 (L) 30.0 - 36.0 g/dL   RDW 84.1 (H) 88.4 - 84.4 %   Platelets 247 150 - 400 K/uL    nRBC 0.0 0.0 - 0.2 %   Neutrophils Relative % 71 %   Neutro Abs 6.3 1.7 - 7.7 K/uL   Lymphocytes Relative 13 %   Lymphs Abs 1.1 0.7 - 4.0 K/uL   Monocytes Relative 9 %   Monocytes Absolute 0.8 0.1 - 1.0 K/uL   Eosinophils Relative 5 %   Eosinophils Absolute 0.4 0.0 - 0.5 K/uL   Basophils Relative 1 %   Basophils Absolute 0.1 0.0 - 0.1 K/uL   Immature Granulocytes 1 %   Abs Immature Granulocytes 0.09 (H) 0.00 - 0.07 K/uL    Comment: Performed at [facility], [address] [place], [place] [address]    No results found.  Review of Systems  Constitutional:  Negative for chills, diaphoresis and fever.  HENT:  Negative for ear discharge, ear pain, hearing loss and tinnitus.   Eyes:  Negative for photophobia and pain.  Respiratory:  Negative for cough and shortness of breath.   Cardiovascular:  Negative for chest pain.  Gastrointestinal:  Negative for abdominal pain, nausea and vomiting.  Genitourinary:  Negative for dysuria, flank pain, frequency and urgency.  Musculoskeletal:  Positive for arthralgias (Right knee). Negative for back pain, myalgias and  neck pain.  Skin:  Positive for wound.  Neurological:  Negative for dizziness and headaches.  Hematological:  Does not bruise/bleed easily.  Psychiatric/Behavioral:  The patient is not nervous/anxious.    Blood pressure (!) 152/75, pulse 95, temperature 98.1 F (36.7 C), temperature source Oral, resp. rate 17, SpO2 99%. Physical Exam Constitutional:      General: He is not in acute distress.    Appearance: He is well-developed. He is not diaphoretic.  HENT:     Head: Normocephalic and atraumatic.  Eyes:     General: No scleral icterus.       Right eye: No discharge.        Left eye: No discharge.     Conjunctiva/sclera: Conjunctivae normal.  Cardiovascular:     Rate and Rhythm: Normal rate and regular rhythm.  Pulmonary:     Effort: Pulmonary effort is normal. No respiratory distress.  Musculoskeletal:     Cervical  back: Normal range of motion.     Comments: RLE No traumatic wounds, ecchymosis, or rash  Wound over lateral proximal tibia with exposed plate, purulent discharge  No knee or ankle effusion  Knee stable to varus/ valgus and anterior/posterior stress  Sens DPN, SPN, TN intact  Motor EHL, ext, flex, evers 5/5  DP 2+, PT 2+, 2+ NP edema  Skin:    General: Skin is warm and dry.  Neurological:     Mental Status: He is alert.  Psychiatric:        Mood and Affect: Mood normal.        Behavior: Behavior normal.     Assessment/Plan: Right leg hardware infection -- Plan I&D, possible hardware removal and/or ex fix tomorrow with [name]. Please keep NPO after MN.    [name], PA-C Orthopedic Surgery [phone] 10/14/2023, 3:06 PM

## 2023-10-14 NOTE — ED Provider Notes (Incomplete)
  Physical Exam  BP (!) 152/75   Pulse 95   Temp 98.1 F (36.7 C) (Oral)   Resp 17   SpO2 99%   Physical Exam  Procedures  Procedures  ED Course / MDM   Clinical Course as of 10/14/23 1513  Tue Oct 14, 2023  1441 Spoke w/ ortho PA Reyes, will come see, recommend med admit. Surgical planning  [SG]    Clinical Course User Index [SG] Elnor Jayson LABOR, DO   Medical Decision Making Amount and/or Complexity of Data Reviewed Labs: ordered. Radiology: ordered.  Risk Prescription drug management.   *** Right proximal tibia surgery 08/2023 with [name].  Today had wound dehiscence.  Admit to medicine, Orthopedics has been consulted.

## 2023-10-14 NOTE — Consult Note (Signed)
 Reason for Consult:Right hardware infection Referring Physician: Jayson Coleman Time called: 1437 Time at bedside: 1501   Joe Coleman is an 78 y.o. male.  HPI: Joe Coleman developed a wound over his right tibia about 4d ago. It began to drain bloody discharge. He was brought to the ED today for evaluation. Hardware was visible in the wound bed and orthopedic surgery was consulted. He only has pain with it when he bears weight. Denies fevers, chills, sweats, N/V.  Past Medical History:  Diagnosis Date   Cancer (HCC)    stomach and colon   Diabetes mellitus without complication (HCC)    Hypertension     Past Surgical History:  Procedure Laterality Date   APPENDECTOMY     COLON SURGERY     ORIF TIBIA FRACTURE Right 08/25/2023   Procedure: OPEN REDUCTION INTERNAL FIXATION (ORIF) TIBIA FRACTURE, RIGHT;  Surgeon: Joe Coleman SQUIBB, MD;  Location: [facility] OR;  Service: Orthopedics;  Laterality: Right;    Family History  Problem Relation Age of Onset   Heart disease Mother    Heart disease Brother     Social History:  reports that he has quit smoking. He has never used smokeless tobacco. He reports that he does not drink alcohol and does not use drugs.  Allergies:  Allergies  Allergen Reactions   Chicken Allergy Nausea And Vomiting        Losartan     Hyperkalemia (>5.5 multiple times on losartan 25mg )   Poultry Meal Nausea And Vomiting    Medications: I have reviewed the patient's current medications.  Results for orders placed or performed during the hospital encounter of 10/14/23 (from the past 48 hours)  CBC with Differential     Status: Abnormal   Collection Time: 10/14/23  2:45 PM  Result Value Ref Range   WBC 8.7 4.0 - 10.5 K/uL   RBC 4.13 (L) 4.22 - 5.81 MIL/uL   Hemoglobin 11.3 (L) 13.0 - 17.0 g/dL   HCT 60.8 60.9 - 47.9 %   MCV 94.7 80.0 - 100.0 fL   MCH 27.4 26.0 - 34.0 pg   MCHC 28.9 (L) 30.0 - 36.0 g/dL   RDW 84.1 (H) 88.4 - 84.4 %   Platelets 247 150 - 400 K/uL    nRBC 0.0 0.0 - 0.2 %   Neutrophils Relative % 71 %   Neutro Abs 6.3 1.7 - 7.7 K/uL   Lymphocytes Relative 13 %   Lymphs Abs 1.1 0.7 - 4.0 K/uL   Monocytes Relative 9 %   Monocytes Absolute 0.8 0.1 - 1.0 K/uL   Eosinophils Relative 5 %   Eosinophils Absolute 0.4 0.0 - 0.5 K/uL   Basophils Relative 1 %   Basophils Absolute 0.1 0.0 - 0.1 K/uL   Immature Granulocytes 1 %   Abs Immature Granulocytes 0.09 (H) 0.00 - 0.07 K/uL    Comment: Performed at [facility], [address] [place], [place] [address]    No results found.  Review of Systems  Constitutional:  Negative for chills, diaphoresis and fever.  HENT:  Negative for ear discharge, ear pain, hearing loss and tinnitus.   Eyes:  Negative for photophobia and pain.  Respiratory:  Negative for cough and shortness of breath.   Cardiovascular:  Negative for chest pain.  Gastrointestinal:  Negative for abdominal pain, nausea and vomiting.  Genitourinary:  Negative for dysuria, flank pain, frequency and urgency.  Musculoskeletal:  Positive for arthralgias (Right knee). Negative for back pain, myalgias and  neck pain.  Skin:  Positive for wound.  Neurological:  Negative for dizziness and headaches.  Hematological:  Does not bruise/bleed easily.  Psychiatric/Behavioral:  The patient is not nervous/anxious.    Blood pressure (!) 152/75, pulse 95, temperature 98.1 F (36.7 C), temperature source Oral, resp. rate 17, SpO2 99%. Physical Exam Constitutional:      General: He is not in acute distress.    Appearance: He is well-developed. He is not diaphoretic.  HENT:     Head: Normocephalic and atraumatic.  Eyes:     General: No scleral icterus.       Right eye: No discharge.        Left eye: No discharge.     Conjunctiva/sclera: Conjunctivae normal.  Cardiovascular:     Rate and Rhythm: Normal rate and regular rhythm.  Pulmonary:     Effort: Pulmonary effort is normal. No respiratory distress.  Musculoskeletal:     Cervical  back: Normal range of motion.     Comments: RLE No traumatic wounds, ecchymosis, or rash  Wound over lateral proximal tibia with exposed plate, purulent discharge  No knee or ankle effusion  Knee stable to varus/ valgus and anterior/posterior stress  Sens DPN, SPN, TN intact  Motor EHL, ext, flex, evers 5/5  DP 2+, PT 2+, 2+ NP edema  Skin:    General: Skin is warm and dry.  Neurological:     Mental Status: He is alert.  Psychiatric:        Mood and Affect: Mood normal.        Behavior: Behavior normal.     Assessment/Plan: Right leg hardware infection -- Plan I&D, possible hardware removal and/or ex fix tomorrow with [name]. Please keep NPO after MN.    Joe DOROTHA Ned, PA-C Orthopedic Surgery [phone] 10/14/2023, 3:06 PM

## 2023-10-15 ENCOUNTER — Encounter (HOSPITAL_COMMUNITY)
Admission: EM | Disposition: A | Discharge status: Home / Self Care | Payer: Self-pay | Source: Home / Self Care | Attending: Internal Medicine

## 2023-10-15 ENCOUNTER — Inpatient Hospital Stay (HOSPITAL_COMMUNITY): Admitting: Certified Registered Nurse Anesthetist

## 2023-10-15 ENCOUNTER — Inpatient Hospital Stay (HOSPITAL_COMMUNITY)

## 2023-10-15 ENCOUNTER — Other Ambulatory Visit: Payer: Self-pay

## 2023-10-15 ENCOUNTER — Encounter (HOSPITAL_COMMUNITY)

## 2023-10-15 ENCOUNTER — Encounter (HOSPITAL_COMMUNITY): Payer: Self-pay | Admitting: Internal Medicine

## 2023-10-15 DIAGNOSIS — T8459XA Infection and inflammatory reaction due to other internal joint prosthesis, initial encounter: Secondary | ICD-10-CM | POA: Diagnosis not present

## 2023-10-15 DIAGNOSIS — I129 Hypertensive chronic kidney disease with stage 1 through stage 4 chronic kidney disease, or unspecified chronic kidney disease: Secondary | ICD-10-CM | POA: Diagnosis not present

## 2023-10-15 DIAGNOSIS — S81001S Unspecified open wound, right knee, sequela: Secondary | ICD-10-CM | POA: Diagnosis not present

## 2023-10-15 DIAGNOSIS — S81001D Unspecified open wound, right knee, subsequent encounter: Secondary | ICD-10-CM

## 2023-10-15 DIAGNOSIS — Z87891 Personal history of nicotine dependence: Secondary | ICD-10-CM | POA: Diagnosis not present

## 2023-10-15 DIAGNOSIS — N183 Chronic kidney disease, stage 3 unspecified: Secondary | ICD-10-CM | POA: Diagnosis not present

## 2023-10-15 HISTORY — PX: HARDWARE REMOVAL: SHX979

## 2023-10-15 LAB — MRSA NEXT GEN BY PCR, NASAL: MRSA by PCR Next Gen: NOT DETECTED

## 2023-10-15 LAB — GLUCOSE, CAPILLARY
Glucose-Capillary: 76 mg/dL (ref 70–99)
Glucose-Capillary: 103 mg/dL — ABNORMAL HIGH (ref 70–99)
Glucose-Capillary: 88 mg/dL (ref 70–99)
Glucose-Capillary: 129 mg/dL — ABNORMAL HIGH (ref 70–99)
Glucose-Capillary: 113 mg/dL — ABNORMAL HIGH (ref 70–99)
Glucose-Capillary: 196 mg/dL — ABNORMAL HIGH (ref 70–99)
Glucose-Capillary: 189 mg/dL — ABNORMAL HIGH (ref 70–99)

## 2023-10-15 SURGERY — REMOVAL, HARDWARE
Anesthesia: Choice | Site: Leg Lower | Laterality: Right

## 2023-10-15 MED ORDER — ACETAMINOPHEN 325 MG PO TABS
650.0000 mg | ORAL_TABLET | Freq: Four times a day (QID) | ORAL | Status: DC
  Administered 2023-10-15 – 2023-10-24 (×26): 650 mg via ORAL
  Filled 2023-10-15 (×27): qty 2

## 2023-10-15 MED ORDER — DEXTROSE IN LACTATED RINGERS 5 % IV SOLN
INTRAVENOUS | Status: AC
Start: 2023-10-15 — End: 2023-10-15

## 2023-10-15 MED ORDER — FENTANYL CITRATE (PF) 250 MCG/5ML IJ SOLN
INTRAMUSCULAR | Status: AC
  Filled 2023-10-15: qty 5

## 2023-10-15 MED ORDER — DOCUSATE SODIUM 100 MG PO CAPS
100.0000 mg | ORAL_CAPSULE | Freq: Two times a day (BID) | ORAL | Status: DC
  Administered 2023-10-15 – 2023-10-24 (×18): 100 mg via ORAL
  Filled 2023-10-15 (×19): qty 1

## 2023-10-15 MED ORDER — INSULIN ASPART 100 UNIT/ML IJ SOLN: 0.0000 [IU] | INTRAMUSCULAR | Status: DC | PRN

## 2023-10-15 MED ORDER — TOBRAMYCIN SULFATE 1.2 G IJ SOLR
INTRAMUSCULAR | Status: DC | PRN
  Administered 2023-10-15: 1.2 g

## 2023-10-15 MED ORDER — LACTATED RINGERS IV SOLN: INTRAVENOUS | Status: DC

## 2023-10-15 MED ORDER — MORPHINE SULFATE (PF) 2 MG/ML IV SOLN: 1.0000 mg | INTRAVENOUS | Status: DC | PRN

## 2023-10-15 MED ORDER — PHENYLEPHRINE 80 MCG/ML (10ML) SYRINGE FOR IV PUSH (FOR BLOOD PRESSURE SUPPORT)
PREFILLED_SYRINGE | INTRAVENOUS | Status: AC
  Filled 2023-10-15: qty 10

## 2023-10-15 MED ORDER — METOCLOPRAMIDE HCL 5 MG PO TABS: 5.0000 mg | ORAL_TABLET | Freq: Three times a day (TID) | ORAL | Status: DC | PRN

## 2023-10-15 MED ORDER — ROCURONIUM BROMIDE 10 MG/ML (PF) SYRINGE
PREFILLED_SYRINGE | INTRAVENOUS | Status: DC | PRN
  Administered 2023-10-15: 40 mg via INTRAVENOUS

## 2023-10-15 MED ORDER — FENTANYL CITRATE (PF) 100 MCG/2ML IJ SOLN
INTRAMUSCULAR | Status: AC
  Filled 2023-10-15: qty 2

## 2023-10-15 MED ORDER — ONDANSETRON HCL 4 MG/2ML IJ SOLN
INTRAMUSCULAR | Status: DC | PRN
  Administered 2023-10-15: 4 mg via INTRAVENOUS

## 2023-10-15 MED ORDER — DEXAMETHASONE SODIUM PHOSPHATE 10 MG/ML IJ SOLN
INTRAMUSCULAR | Status: DC | PRN
  Administered 2023-10-15: 5 mg via INTRAVENOUS

## 2023-10-15 MED ORDER — SODIUM CHLORIDE 0.9 % IR SOLN
Status: DC | PRN
  Administered 2023-10-15: 3000 mL

## 2023-10-15 MED ORDER — CHLORHEXIDINE GLUCONATE 0.12 % MT SOLN
OROMUCOSAL | Status: AC
  Administered 2023-10-15: 15 mL via OROMUCOSAL
  Filled 2023-10-15: qty 15

## 2023-10-15 MED ORDER — DROPERIDOL 2.5 MG/ML IJ SOLN: 0.6250 mg | Freq: Once | INTRAMUSCULAR | Status: DC | PRN

## 2023-10-15 MED ORDER — PHENYLEPHRINE 80 MCG/ML (10ML) SYRINGE FOR IV PUSH (FOR BLOOD PRESSURE SUPPORT)
PREFILLED_SYRINGE | INTRAVENOUS | Status: DC | PRN
  Administered 2023-10-15 (×3): 160 ug via INTRAVENOUS
  Administered 2023-10-15: 80 ug via INTRAVENOUS
  Administered 2023-10-15: 160 ug via INTRAVENOUS
  Administered 2023-10-15: 80 ug via INTRAVENOUS

## 2023-10-15 MED ORDER — FENTANYL CITRATE (PF) 100 MCG/2ML IJ SOLN
25.0000 ug | INTRAMUSCULAR | Status: DC | PRN
  Administered 2023-10-15 (×2): 50 ug via INTRAVENOUS

## 2023-10-15 MED ORDER — CHLORHEXIDINE GLUCONATE 0.12 % MT SOLN: 15.0000 mL | Freq: Once | OROMUCOSAL | Status: AC

## 2023-10-15 MED ORDER — 0.9 % SODIUM CHLORIDE (POUR BTL) OPTIME
TOPICAL | Status: DC | PRN
  Administered 2023-10-15: 1000 mL

## 2023-10-15 MED ORDER — OXYCODONE HCL 5 MG/5ML PO SOLN: 5.0000 mg | Freq: Once | ORAL | Status: DC | PRN

## 2023-10-15 MED ORDER — CHLORHEXIDINE GLUCONATE 4 % EX SOLN
60.0000 mL | Freq: Once | CUTANEOUS | Status: AC
  Administered 2023-10-15: 4 via TOPICAL
  Filled 2023-10-15: qty 60

## 2023-10-15 MED ORDER — VANCOMYCIN HCL 1000 MG IV SOLR
INTRAVENOUS | Status: DC | PRN
  Administered 2023-10-15: 1000 mg

## 2023-10-15 MED ORDER — ASPIRIN 325 MG PO TABS
325.0000 mg | ORAL_TABLET | Freq: Every day | ORAL | Status: DC
  Administered 2023-10-16 – 2023-10-23 (×8): 325 mg via ORAL
  Filled 2023-10-15 (×8): qty 1

## 2023-10-15 MED ORDER — VANCOMYCIN HCL 1000 MG IV SOLR
INTRAVENOUS | Status: AC
  Filled 2023-10-15: qty 20

## 2023-10-15 MED ORDER — DEXAMETHASONE SODIUM PHOSPHATE 10 MG/ML IJ SOLN
INTRAMUSCULAR | Status: AC
  Filled 2023-10-15: qty 1

## 2023-10-15 MED ORDER — DIPHENHYDRAMINE HCL 12.5 MG/5ML PO ELIX
12.5000 mg | ORAL_SOLUTION | ORAL | Status: DC | PRN
  Administered 2023-10-17 – 2023-10-22 (×3): 25 mg via ORAL
  Filled 2023-10-15 (×4): qty 10

## 2023-10-15 MED ORDER — POVIDONE-IODINE 10 % EX SWAB: 2.0000 | Freq: Once | CUTANEOUS | Status: DC

## 2023-10-15 MED ORDER — PROPOFOL 10 MG/ML IV BOLUS
INTRAVENOUS | Status: AC
  Filled 2023-10-15: qty 20

## 2023-10-15 MED ORDER — TOBRAMYCIN SULFATE 1.2 G IJ SOLR
INTRAMUSCULAR | Status: AC
Start: 2023-10-15 — End: 2023-10-15
  Filled 2023-10-15: qty 1.2

## 2023-10-15 MED ORDER — ACETAMINOPHEN 650 MG RE SUPP
650.0000 mg | Freq: Four times a day (QID) | RECTAL | Status: DC
  Filled 2023-10-15: qty 1

## 2023-10-15 MED ORDER — OXYCODONE HCL 5 MG PO TABS: 5.0000 mg | ORAL_TABLET | Freq: Once | ORAL | Status: DC | PRN

## 2023-10-15 MED ORDER — ACETAMINOPHEN 10 MG/ML IV SOLN: 1000.0000 mg | Freq: Once | INTRAVENOUS | Status: DC | PRN

## 2023-10-15 MED ORDER — LIDOCAINE 2% (20 MG/ML) 5 ML SYRINGE
INTRAMUSCULAR | Status: DC | PRN
  Administered 2023-10-15: 80 mg via INTRAVENOUS

## 2023-10-15 MED ORDER — CEFAZOLIN SODIUM-DEXTROSE 2-4 GM/100ML-% IV SOLN
2.0000 g | INTRAVENOUS | Status: DC
Start: 2023-10-15 — End: 2023-10-15
  Filled 2023-10-15: qty 100

## 2023-10-15 MED ORDER — ORAL CARE MOUTH RINSE: 15.0000 mL | Freq: Once | OROMUCOSAL | Status: AC

## 2023-10-15 MED ORDER — ROCURONIUM BROMIDE 10 MG/ML (PF) SYRINGE
PREFILLED_SYRINGE | INTRAVENOUS | Status: AC
  Filled 2023-10-15: qty 10

## 2023-10-15 MED ORDER — METHOCARBAMOL 1000 MG/10ML IJ SOLN: 500.0000 mg | Freq: Four times a day (QID) | INTRAMUSCULAR | Status: DC | PRN

## 2023-10-15 MED ORDER — FENTANYL CITRATE (PF) 250 MCG/5ML IJ SOLN
INTRAMUSCULAR | Status: DC | PRN
  Administered 2023-10-15 (×3): 50 ug via INTRAVENOUS

## 2023-10-15 MED ORDER — METHOCARBAMOL 500 MG PO TABS
500.0000 mg | ORAL_TABLET | Freq: Four times a day (QID) | ORAL | Status: DC | PRN
  Administered 2023-10-22 (×2): 500 mg via ORAL
  Filled 2023-10-15 (×2): qty 1

## 2023-10-15 MED ORDER — LIDOCAINE 2% (20 MG/ML) 5 ML SYRINGE
INTRAMUSCULAR | Status: AC
  Filled 2023-10-15: qty 5

## 2023-10-15 MED ORDER — ONDANSETRON HCL 4 MG/2ML IJ SOLN
INTRAMUSCULAR | Status: AC
  Filled 2023-10-15: qty 2

## 2023-10-15 MED ORDER — SUGAMMADEX SODIUM 200 MG/2ML IV SOLN
INTRAVENOUS | Status: DC | PRN
  Administered 2023-10-15 (×2): 100 mg via INTRAVENOUS

## 2023-10-15 MED ORDER — ACETAMINOPHEN 10 MG/ML IV SOLN
INTRAVENOUS | Status: DC | PRN
  Administered 2023-10-15: 1000 mg via INTRAVENOUS

## 2023-10-15 MED ORDER — METOCLOPRAMIDE HCL 5 MG/ML IJ SOLN: 5.0000 mg | Freq: Three times a day (TID) | INTRAMUSCULAR | Status: DC | PRN

## 2023-10-15 MED ORDER — PROPOFOL 10 MG/ML IV BOLUS
INTRAVENOUS | Status: DC | PRN
  Administered 2023-10-15: 100 mg via INTRAVENOUS

## 2023-10-15 SURGICAL SUPPLY — 67 items
BAG COUNTER SPONGE SURGICOUNT (BAG) ×2 IMPLANT
BANDAGE ESMARK 6X9 LF (GAUZE/BANDAGES/DRESSINGS) ×1 IMPLANT
BNDG COHESIVE 4X5 TAN STRL LF (GAUZE/BANDAGES/DRESSINGS) ×1 IMPLANT
BNDG COHESIVE 6X5 TAN ST LF (GAUZE/BANDAGES/DRESSINGS) ×1 IMPLANT
BNDG ELASTIC 4INX 5YD STR LF (GAUZE/BANDAGES/DRESSINGS) ×1 IMPLANT
BNDG ELASTIC 4X5.8 VLCR STR LF (GAUZE/BANDAGES/DRESSINGS) ×1 IMPLANT
BNDG ELASTIC 6INX 5YD STR LF (GAUZE/BANDAGES/DRESSINGS) ×3 IMPLANT
BNDG GAUZE DERMACEA FLUFF 4 (GAUZE/BANDAGES/DRESSINGS) ×2 IMPLANT
BRUSH SCRUB EZ PLAIN DRY (MISCELLANEOUS) ×3 IMPLANT
CANISTER WOUNDNEG PRESSURE 500 (CANNISTER) ×1 IMPLANT
CHLORAPREP W/TINT 26 (MISCELLANEOUS) ×4 IMPLANT
COVER SURGICAL LIGHT HANDLE (MISCELLANEOUS) ×3 IMPLANT
CUFF TOURN SGL QUICK 18X4 (TOURNIQUET CUFF) IMPLANT
CUFF TRNQT CYL 24X4X16.5-23 (TOURNIQUET CUFF) IMPLANT
CUFF TRNQT CYL 34X4.125X (TOURNIQUET CUFF) IMPLANT
DRAPE C-ARM 42X72 X-RAY (DRAPES) IMPLANT
DRAPE C-ARMOR (DRAPES) ×1 IMPLANT
DRAPE IMP U-DRAPE 54X76 (DRAPES) ×4 IMPLANT
DRAPE INCISE IOBAN 66X45 STRL (DRAPES) ×1 IMPLANT
DRAPE SURG ORHT 6 SPLT 77X108 (DRAPES) ×4 IMPLANT
DRAPE U-SHAPE 47X51 STRL (DRAPES) ×2 IMPLANT
DRESSING MORCELLS FINE 1000 (Tissue) ×1 IMPLANT
DRSG ADAPTIC 3X8 NADH LF (GAUZE/BANDAGES/DRESSINGS) ×1 IMPLANT
DRSG CUTIMED SORBACT 7X9 (GAUZE/BANDAGES/DRESSINGS) ×1 IMPLANT
DRSG MEPITEL 4X7.2 (GAUZE/BANDAGES/DRESSINGS) ×1 IMPLANT
DRSG VAC GRANUFOAM MED (GAUZE/BANDAGES/DRESSINGS) ×1 IMPLANT
ELECTRODE REM PT RTRN 9FT ADLT (ELECTROSURGICAL) ×2 IMPLANT
GAUZE PAD ABD 8X10 STRL (GAUZE/BANDAGES/DRESSINGS) ×2 IMPLANT
GAUZE SPONGE 4X4 12PLY STRL (GAUZE/BANDAGES/DRESSINGS) ×2 IMPLANT
GLOVE BIO SURGEON STRL SZ 6.5 (GLOVE) ×6 IMPLANT
GLOVE BIO SURGEON STRL SZ7.5 (GLOVE) ×8 IMPLANT
GLOVE BIOGEL PI IND STRL 6.5 (GLOVE) ×2 IMPLANT
GLOVE BIOGEL PI IND STRL 7.5 (GLOVE) ×2 IMPLANT
GOWN STRL REUS W/ TWL LRG LVL3 (GOWN DISPOSABLE) ×4 IMPLANT
GRAFT MYRIAD 7X10 (Graft) ×1 IMPLANT
KIT BASIN OR (CUSTOM PROCEDURE TRAY) ×2 IMPLANT
KIT TURNOVER KIT B (KITS) ×2 IMPLANT
MANIFOLD NEPTUNE II (INSTRUMENTS) ×2 IMPLANT
NDL 22X1.5 STRL (OR ONLY) (MISCELLANEOUS) IMPLANT
NEEDLE 22X1.5 STRL (OR ONLY) (MISCELLANEOUS) IMPLANT
NS IRRIG 1000ML POUR BTL (IV SOLUTION) ×2 IMPLANT
PACK ORTHO EXTREMITY (CUSTOM PROCEDURE TRAY) ×2 IMPLANT
PAD ARMBOARD POSITIONER FOAM (MISCELLANEOUS) ×4 IMPLANT
PAD CAST CTTN 4X4 STRL (SOFTGOODS) ×1 IMPLANT
PADDING CAST COTTON 6X4 STRL (CAST SUPPLIES) ×4 IMPLANT
SET HNDPC FAN SPRY TIP SCT (DISPOSABLE) ×1 IMPLANT
SPLINT PLASTER CAST FAST 5X30 (CAST SUPPLIES) ×1 IMPLANT
SPONGE T-LAP 18X18 ~~LOC~~+RFID (SPONGE) ×2 IMPLANT
STAPLER SKIN PROX 35W (STAPLE) ×1 IMPLANT
STOCKINETTE IMPERVIOUS LG (DRAPES) ×1 IMPLANT
STRIP CLOSURE SKIN 1/2X4 (GAUZE/BANDAGES/DRESSINGS) IMPLANT
SUCTION TUBE FRAZIER 10FR DISP (SUCTIONS) IMPLANT
SUT ETHILON 2 0 PSLX (SUTURE) ×2 IMPLANT
SUT ETHILON 3 0 PS 1 (SUTURE) IMPLANT
SUT MNCRL AB 3-0 PS2 18 (SUTURE) ×2 IMPLANT
SUT MON AB 2-0 CT1 36 (SUTURE) ×1 IMPLANT
SUT MON AB 2-0 SH27 (SUTURE) ×2 IMPLANT
SUT PDS AB 2-0 CT1 27 (SUTURE) IMPLANT
SUT VIC AB 0 CT1 27XBRD ANBCTR (SUTURE) IMPLANT
SUT VIC AB 2-0 CT1 TAPERPNT 27 (SUTURE) IMPLANT
SYR CONTROL 10ML LL (SYRINGE) IMPLANT
TOWEL GREEN STERILE (TOWEL DISPOSABLE) ×4 IMPLANT
TOWEL GREEN STERILE FF (TOWEL DISPOSABLE) ×4 IMPLANT
TUBE CONNECTING 12X1/4 (SUCTIONS) ×2 IMPLANT
UNDERPAD 30X36 HEAVY ABSORB (UNDERPADS AND DIAPERS) ×2 IMPLANT
WATER STERILE IRR 1000ML POUR (IV SOLUTION) ×3 IMPLANT
YANKAUER SUCT BULB TIP NO VENT (SUCTIONS) ×2 IMPLANT

## 2023-10-15 NOTE — Progress Notes (Signed)
 Orthopedic Tech Progress Note Patient Details:  Joe Coleman Apr 08, 1945 [id number]  Patient ID: Joe Coleman Joe Coleman, male   DOB: February 15, 1945, 78 y.o.   MRN: [id number] No OHF. Age restricted. Joe Coleman 10/15/2023, 11:42 AM

## 2023-10-15 NOTE — Progress Notes (Addendum)
 PROGRESS NOTE  Joe Coleman FMW:[id number] DOB: 12-11-1945 DOA: 10/14/2023 PCP: [facility]   LOS: 1 day   Brief Narrative / Interim history: 78 year old male with DM2, HTN comes into the hospital with wound dehiscence.  He was admitted for leg injury on July 19 with right tibial fracture, underwent immobilization with a long-leg splint with ORIF 7/21, discharged 7/28.  He was seen in the ED mid August with concern for wound infection and started on doxycycline .  Did not really improve and came back to the hospital.  Orthopedic surgery consulted  Subjective / 24h Interval events: Doing well, seen following his surgery  Assesement and Plan: Principal problem Right leg wound/hardware infection -patient was admitted to the hospital and placed on IV antibiotics, continue.  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  Hold metformin   Lab Results  Component Value Date   HGBA1C 7.6 (H) 08/10/2023    Scheduled Meds:  [MAR Hold] budesonide -glycopyrrolate -formoterol   2 puff Inhalation BID   [MAR Hold] gabapentin   300 mg Oral QHS   [MAR Hold] heparin   5,000 Units Subcutaneous Q8H   [MAR Hold] pantoprazole   40 mg Oral Daily   povidone-iodine   2 Application Topical Once   Continuous Infusions:  acetaminophen       ceFAZolin  (ANCEF ) IV     [MAR Hold] ceFEPime  (MAXIPIME ) IV 2 g (10/15/23 0407)   dextrose  5% lactated ringers  20 mL/hr at 10/15/23 0353   lactated ringers  30 mL/hr at 10/15/23 0825    lactated ringers  10 mL/hr at 10/15/23 0728   [MAR Hold] vancomycin      PRN Meds:.acetaminophen , [MAR Hold] acetaminophen  **OR** [MAR Hold] acetaminophen , [MAR Hold] albuterol , [MAR Hold] bisacodyl , droperidol , fentaNYL  (SUBLIMAZE ) injection, [MAR Hold] hydrALAZINE , [MAR Hold] insulin  aspart, insulin  aspart, [MAR Hold]  morphine  injection, [MAR Hold] nicotine , [MAR Hold] ondansetron  **OR** [MAR Hold] ondansetron  (ZOFRAN ) IV, [MAR Hold] oxyCODONE , oxyCODONE  **OR** oxyCODONE , [MAR Hold] polyethylene glycol  Current Outpatient Medications  Medication Instructions   albuterol  (PROVENTIL  HFA;VENTOLIN  HFA) 108 (90 Base) MCG/ACT inhaler 2 puffs, Inhalation, Every 6 hours PRN   doxycycline  (VIBRA -TABS) 100 mg, Oral, 2 times daily   feeding supplement, GLUCERNA SHAKE, (GLUCERNA SHAKE) LIQD 237 mLs, Oral, 3 times daily between meals   gabapentin  (NEURONTIN ) 300 mg, Daily at bedtime   HYDROcodone-acetaminophen  (NORCO/VICODIN) 5-325 MG tablet 1 tablet, Oral, Every 8 hours PRN   ipratropium-albuterol  (DUONEB) 0.5-2.5 (3) MG/3ML SOLN 3 mLs, Nebulization, Every 4 hours PRN   metFORMIN  (GLUCOPHAGE ) 500 mg, 2 times daily with meals   methocarbamol  (ROBAXIN ) 500 mg, Oral, Every 6 hours PRN   oxyCODONE  (ROXICODONE ) 2.5-5 mg, Oral, Every 4 hours PRN   pantoprazole  (PROTONIX ) 40 mg, Daily   polyethylene glycol (MIRALAX  / GLYCOLAX ) 17 g, Oral, Daily   senna-docusate (SENOKOT-S) 8.6-50 MG tablet 1 tablet, Oral, Daily at bedtime   TRELEGY ELLIPTA 200-62.5-25 MCG/ACT AEPB 1 puff, Daily   vitamin D3 (CHOLECALCIFEROL ) 1,000 Units, Oral, Daily    Diet Orders (From admission, onward)     Start     Ordered   10/15/23  1106  Diet Carb Modified Fluid consistency: Thin; Room service appropriate? Yes  Diet effective now       Question Answer Comment  Calorie Level Medium 1600-2000   Fluid consistency: Thin   Room service appropriate? Yes      10/15/23 1106            DVT prophylaxis: heparin  injection 5,000  Units Start: 10/14/23 1615   Lab Results  Component Value Date   PLT 247 10/14/2023      Code Status: Full Code  Family Communication: no family at bedside   Status is: Inpatient Remains inpatient appropriate because: severity of illness  Level of care: Telemetry Medical  Consultants:  Orthopedic surgery  Objective: Vitals:   10/15/23 1015 10/15/23 1030 10/15/23 1045 10/15/23 1100  BP: (!) 118/48 (!) 121/55 126/78 104/74  Pulse: 79 80 78 77  Resp: (!) 21 17 (!) 24 18  Temp: 97.7 F (36.5 C)     TempSrc:      SpO2: 100% 98% 98% 100%  Weight:      Height:        Intake/Output Summary (Last 24 hours) at 10/15/2023 1124 Last data filed at 10/15/2023 1004 Gross per 24 hour  Intake 1726.83 ml  Output 675 ml  Net 1051.83 ml   Wt Readings from Last 3 Encounters:  10/14/23 65.8 kg  09/24/23 68 kg  08/23/23 68 kg    Examination:  Constitutional: NAD Eyes: no scleral icterus ENMT: Mucous membranes are moist.  Neck: normal, supple Respiratory: clear to auscultation bilaterally, no wheezing, no crackles.  Cardiovascular: Regular rate and rhythm, no murmurs / rubs / gallops. No LE edema.  Abdomen: non distended, no tenderness. Bowel sounds positive.  Musculoskeletal: no clubbing / cyanosis.    Data Reviewed: I have independently reviewed following labs and imaging studies   CBC Recent Labs  Lab 10/14/23 1445  WBC 8.7  HGB 11.3*  HCT 39.1  PLT 247  MCV 94.7  MCH 27.4  MCHC 28.9*  RDW 15.8*  LYMPHSABS 1.1  MONOABS 0.8  EOSABS 0.4  BASOSABS 0.1    Recent Labs  Lab 10/14/23 1445  NA 136  K 4.9  CL 106  CO2 20*  GLUCOSE 111*  BUN 24*  CREATININE 1.62*  CALCIUM 8.8*  AST 25  ALT 9  ALKPHOS 104  BILITOT 0.5  ALBUMIN  2.9*    ------------------------------------------------------------------------------------------------------------------ No results for input(s): CHOL, HDL, LDLCALC, TRIG, CHOLHDL, LDLDIRECT in the last 72  hours.  Lab Results  Component Value Date   HGBA1C 7.6 (H) 08/10/2023   ------------------------------------------------------------------------------------------------------------------ No results for input(s): TSH, T4TOTAL, T3FREE, THYROIDAB in the last 72 hours.  Invalid input(s): FREET3  Cardiac Enzymes No results for input(s): CKMB, TROPONINI, MYOGLOBIN in the last 168 hours.  Invalid input(s): CK ------------------------------------------------------------------------------------------------------------------    Component Value Date/Time   BNP 69.0 04/17/2016 0422    CBG: Recent Labs  Lab 10/14/23 2219 10/15/23 0121 10/15/23 0619 10/15/23 0719 10/15/23 1029  GLUCAP 139* 76 103* 88 129*    Recent Results (from the past 240 hours)  Blood culture (routine x 2)     Status: None (Preliminary result)   Collection Time: 10/14/23  2:45 PM   Specimen: BLOOD  Result Value Ref Range Status   Specimen Description BLOOD SITE NOT SPECIFIED  Final   Special Requests   Final    BOTTLES DRAWN AEROBIC AND ANAEROBIC Blood Culture results may not be optimal due to an inadequate volume of blood received  in culture bottles   Culture   Final    NO GROWTH < 24 HOURS Performed at [facility], [address], [place], [place] [address]    Report Status PENDING  Incomplete  MRSA Next Gen by PCR, Nasal     Status: None   Collection Time: 10/15/23  6:02 AM   Specimen: Nasal Mucosa; Nasal Swab  Result Value Ref Range Status   MRSA by PCR Next Gen NOT DETECTED NOT DETECTED Final    Comment: (NOTE) The GeneXpert MRSA Assay (FDA approved for NASAL specimens only), is one component of a comprehensive MRSA colonization surveillance program. It is not intended to diagnose MRSA infection nor to guide or monitor treatment for MRSA infections. Test performance is not FDA approved in patients less than 63 years old. Performed at [facility], [address]  [place], [place] [address]      Radiology Studies: DG C-Arm 1-60 Min-No Report Result Date: 10/15/2023 Fluoroscopy was utilized by the requesting physician.  No radiographic interpretation.   DG C-Arm 1-60 Min-No Report Result Date: 10/15/2023 Fluoroscopy was utilized by the requesting physician.  No radiographic interpretation.   DG Knee 2 Views Right Result Date: 10/14/2023 CLINICAL DATA:  wound, visible surg hardware EXAM: RIGHT KNEE - 1-2 VIEW COMPARISON:  08/25/2023. FINDINGS: Previously seen comminuted tibial fractures and proximal fibular fractures are again seen. No acute fracture or dislocation. No aggressive osseous lesion. 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 The hardware is intact. No periprosthetic fracture or lucency. There is near anatomic alignment. No knee effusion or focal soft tissue swelling. No radiopaque foreign bodies. IMPRESSION: No acute osseous abnormality of the right knee. Electronically Signed   By: Ree Molt M.D.   On: 10/14/2023 15:23     Nilda Fendt, MD, PhD [facility]  Between 7 am - 7 pm I am available, please contact me via Amion (for emergencies) or Securechat (non urgent messages)  Between 7 pm - 7 am I am not available, please contact night coverage MD/APP via Amion

## 2023-10-15 NOTE — Progress Notes (Signed)
 Pt transported to OR

## 2023-10-15 NOTE — Progress Notes (Signed)
   10/15/23 1135  Vitals  BP 115/64  MAP (mmHg) 79  BP Location Left Arm  BP Method Automatic  Patient Position (if appropriate) Lying  Pulse Rate 74  Pulse Rate Source Dinamap  Resp 17  Level of Consciousness  Level of Consciousness Alert  MEWS COLOR  MEWS Score Color Green  Oxygen Therapy  SpO2 100 %  O2 Device Nasal Cannula  O2 Flow Rate (L/min) 2 L/min  Pain Assessment  Pain Scale 0-10  Pain Score 8  MEWS Score  MEWS Temp 0  MEWS Systolic 0  MEWS Pulse 0  MEWS RR 0  MEWS LOC 0  MEWS Score 0

## 2023-10-15 NOTE — Transfer of Care (Signed)
 Immediate Anesthesia Transfer of Care Note  Patient: Joe Coleman  Procedure(s) Performed: REMOVAL, HARDWARE (Right: Leg Lower)  Patient Location: PACU  Anesthesia Type:General  Level of Consciousness: awake and alert   Airway & Oxygen Therapy: Patient Spontanous Breathing and Patient connected to nasal cannula oxygen  Post-op Assessment: Report given to RN and Post -op Vital signs reviewed and stable  Post vital signs: Reviewed and stable  Last Vitals:  Vitals Value Taken Time  BP 147/60 10/15/23 10:13  Temp    Pulse 81 10/15/23 10:15  Resp 17 10/15/23 10:15  SpO2 100 % 10/15/23 10:15  Vitals shown include unfiled device data.  Last Pain:  Vitals:   10/15/23 0719  TempSrc: Oral  PainSc: 10-Worst pain ever      Patients Stated Pain Goal: 3 (10/15/23 0719)  Complications: No notable events documented.

## 2023-10-15 NOTE — Anesthesia Postprocedure Evaluation (Signed)
 Anesthesia Post Note  Patient: AZAI GAFFIN  Procedure(s) Performed: REMOVAL, HARDWARE (Right: Leg Lower)     Patient location during evaluation: PACU Anesthesia Type: General Level of consciousness: awake and alert Pain management: pain level controlled Vital Signs Assessment: post-procedure vital signs reviewed and stable Respiratory status: spontaneous breathing, nonlabored ventilation, respiratory function stable and patient connected to nasal cannula oxygen Cardiovascular status: blood pressure returned to baseline and stable Postop Assessment: no apparent nausea or vomiting Anesthetic complications: no   No notable events documented.  Last Vitals:  Vitals:   10/15/23 1135 10/15/23 1513  BP: 115/64 110/68  Pulse: 74 82  Resp: 17 17  Temp:  36.5 C  SpO2: 100% 100%    Last Pain:  Vitals:   10/15/23 1703  TempSrc:   PainSc: 4                  Thom JONELLE Peoples

## 2023-10-15 NOTE — TOC CM/SW Note (Signed)
 Prior to admission patient from home , was active with [facility]  for home health RN , OT and PT. Will need new orders for resumption of care.   PT and OT to evaluate

## 2023-10-15 NOTE — Interval H&P Note (Signed)
 History and Physical Interval Note:  10/15/2023 8:20 AM  Joe Coleman  has presented today for surgery, with the diagnosis of wound dehiscence.  The various methods of treatment have been discussed with the patient and family. After consideration of risks, benefits and other options for treatment, the patient has consented to  Procedure(s) with comments: REMOVAL, HARDWARE (Right) - IRRIGATION AND DEBRIDEMENT , POSSIBLE HARDWARE REMOVAL , POSSIBLE EX FIX RIGHT TIBIA EXTERNAL FIXATION, LOWER EXTREMITY (Right) as a surgical intervention.  The patient's history has been reviewed, patient examined, no change in status, stable for surgery.  I have reviewed the patient's chart and labs.  Questions were answered to the patient's satisfaction.     Joe Coleman Joe Coleman

## 2023-10-15 NOTE — Op Note (Signed)
 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  Post-Op Diagnosis: Same  Procedures: CPT 20680-Removal of hardware right tibia CPT 10180-Irrigation and debridement right proximal tibia CPT 15271-Skin graft substitute placement right proximal tibia CPT 29505-Application of long leg splint right leg CPT 97605-Wound vac placement to right proximal tibia   Surgeons : Primary: [name] Franky SQUIBB, MD  Assistant: Lauraine Moores, PA-C  Location: OR 3   Anesthesia:General   Antibiotics: Schedule cefepime  with 1 gm vancomycin  powder and 1.2 gm tobramycin  powder placed topically   Tourniquet time: None    Estimated Blood Loss: 25 mL  Complications:* No complications entered in OR log *   Specimens: ID Type Source Tests Collected by Time Destination  A : right tibial infection 1 Tissue Leg, Right AEROBIC/ANAEROBIC CULTURE W GRAM STAIN (SURGICAL/DEEP WOUND) Levi Crass, Franky SQUIBB, MD 10/15/2023 [phone]   B : right tibial infection 2 Tissue Leg, Right AEROBIC/ANAEROBIC CULTURE W GRAM STAIN (SURGICAL/DEEP WOUND) [name] Franky SQUIBB, MD 10/15/2023 [phone]      Implants: Implant Name Type Inv. Item Serial No. Manufacturer Lot No. LRB No. Used Action  CAP LOCK NCB - [id number] Cap CAP LOCK NCB  [employer]IMMER [employer](ORTH,TRAU,BIO,SG)  Right 7 Explanted  PLATE LOCK LAT 707 RT 13H - [id number] Plate PLATE LOCK LAT 707 RT 13H  [employer](ORTH,TRAU,BIO,SG)  Right 1 Explanted  SCREW HUM NCB PA ST 4X60 - [id number] Screw SCREW HUM NCB PA ST 4X60  [employer](ORTH,TRAU,BIO,SG)  Right 1 Explanted  SCREW NCB 4.0 - [id number] Screw SCREW NCB 4.0  [employer]ON(ORTH,TRAU,BIO,SG)  Right 2 Explanted  SCREW NCB 4.0MX38M - [id number] Screw SCREW NCB 4.0MX38M  [employer](ORTH,TRAU,BIO,SG)  Right 1 Explanted  SCREW NCB 4.9FK53F - [id number] Screw SCREW NCB 4.9FK53F  ZIMMER  RECON(ORTH,TRAU,BIO,SG)  Right 1 Explanted  SCREW NCB 4.0MX50M - [id number] Screw SCREW NCB 4.0MX50M  [employer]MER [employer]ON(ORTH,TRAU,BIO,SG)  Right 1 Explanted  SCREW NCB 4.9FK44F - [id number] Screw SCREW NCB 4.9FK44F  [employer](ORTH,TRAU,BIO,SG)  Right 1 Explanted  SCREW NCB 4.0X26MM - [id number] Screw SCREW NCB 4.0X26MM  [employer](ORTH,TRAU,BIO,SG)  Right 1 Explanted  SCREW NCB 4.0X40MM - [id number] Screw SCREW NCB 4.0X40MM  [employer]MER [employer]ON(ORTH,TRAU,BIO,SG)  Right 1 Explanted  SCREW NCB 4.0X75 CORT S/T - [id number] Screw SCREW NCB 4.0X75 CORT S/T  Z[employer]MER [employer]ON(ORTH,TRAU,BIO,SG)  Right 1 Explanted  GRAFT MYRIAD 7X10 - [id number] Graft GRAFT MYRIAD 7X10  [employer] [id number] Right 1 Implanted  DRESSING MORCELLS FINE 1000 - [id number] Tissue DRESSING MORCELLS FINE 1000  [employer] [id number] Right 1 Implanted     Indications for Surgery: 78 year old male who sustained a complex right proximal tibia fracture around previous total knee replacement that underwent open reduction internal fixation of his right tibia in July of this year.  He had a large degloving injury around his knee.  Unfortunately he developed a large eschar around his proximal incision.  He sustained a fall and the wound opened up and his hardware was exposed.  Due to the lack of soft tissue as well as the exposed hardware I felt that he was indicated for irrigation debridement with possible removal of hardware with possible skin graft substitute placement, possible external fixation versus splinting.  Risks and benefits were discussed with the patient.  Risk include but not limited to bleeding, infection, malunion, nonunion, hardware failure, hardware irritation, nerve and blood vessel injury, refracture, angulation and displacement, nerve and blood vessel injury,  need for further soft tissue coverage, even the possibility anesthetic complications.  He agreed to proceed with surgery and consent was  obtained.  Operative Findings: 1.  Exposed hardware to the right proximal tibia treated with removal of hardware and irrigation debridement of the proximal soft tissue. 2.  Debridement went down to the lateral condyle of the proximal tibia with exposed bone.  There is no signs of chronic osteomyelitis but cultures were taken from the operating room and sent to micro. 3.  After debridement wound size was 4 cm x 11 cm with 2 cm in depth.  Partial approximation of the skin was done with the remainder using a skin graft substitute using Myriad Morcells Fine powder with a Myriad 7x10cm matrix.  Wound VAC placement to the right proximal tibia. 4.  Placement of long-leg splint to the right lower extremity to stabilize the fracture.  Procedure: The patient was identified in the preoperative holding area. Consent was confirmed with the patient and their family and all questions were answered. The operative extremity was marked after confirmation with the patient. he was then brought back to the operating room by our anesthesia colleagues.  He was carefully transferred over to radiolucent flattop table.  He was placed under general anesthetic and a bump was placed under his operative hip.  The right lower extremity was then prepped and draped in usual sterile fashion.  A timeout was performed to verify the patient, the procedure, and the extremity.  Preoperative antibiotics were dosed.  For started out by exposing the proximal plate.  It was without soft tissue coverage.  I debrided the skin edges as well as the eschar that was in place.  Debrided the fibrinous material around it and sent this for culture.  I then proceeded to remove the locking caps from the plate proximally as well as distally.  I then percutaneously removed the shaft screws and then removed the proximal screws as well.  Once I removed all the screws I then slid the plate out of the body.  I debrided the proximal area again sending this for  culture.  At this point I then obtain fluoroscopic imaging and stressed the tibia which showed minimal movement of the fracture site.  I did not feel any further hardware was needed to stabilize the fracture.  I did feel that splinting at the end would be most appropriate.  I then use low-pressure pulsatile lavage to thoroughly irrigate the proximal wound with normal saline.  A total of 3 L were used.  Gloves were then changed and I turned my attention to soft tissue coverage.  After my debridement the wound size was as noted above.  I was able to partially close the proximal portion of the wound with 2-0 nylon.  The remaining wound size was approximately 6 x 4 cm in size.  I then placed a gram of vancomycin  powder and 1.2 g of tobramycin  powder into the wound.  I then placed the myriad powder into the wound which was 1 g.  I then used the matrix to place over the wound and sewed this in place with a Monocryl suture around the periphery of the wound.  I then trimmed the edges of the matrix and then proceeded to place a nonadherent dressing over this with lubrication and a black granular foam sponge.  This was connected to 125 mmHg.  Excellent seal was obtained.  The remainder of the percutaneous incisions were closed with 2-0 nylon.  Sterile dressings  were applied.  A well-padded long-leg splint was then placed.  The patient was then awoke from anesthesia and taken to the PACU in stable condition.   Debridement type: Excisional Debridement  Side: right  Body Location: Tibia  Tools used for debridement: scalpel, curette, and rongeur  Pre-debridement Wound size (cm):   Length: 8        Width: 3     Depth: 1.5   Post-debridement Wound size (cm):   Length: 11.5        Width: 4     Depth: 2   Debridement depth beyond dead/damaged tissue down to healthy viable tissue: yes  Tissue layer involved: skin, subcutaneous tissue, muscle / fascia  Nature of tissue removed: Slough, Necrotic, Devitalized Tissue,  and Purulence  Irrigation volume: 3L     Irrigation fluid type: Normal Saline   Post Op Plan/Instructions: The patient be nonweightbearing to the right lower extremity.  He will remain in the long-leg splint for 2 weeks.  We will likely transition him to a thermoplastic fracture brace to his right tibia once his wound VAC is removed.  Will continue with broad-spectrum antibiotics we will recommend an infectious disease consult to assist with antibiotic regimen.  Will follow the cultures.  I would recommend aspirin  for DVT prophylaxis.  I was present and performed the entire surgery.  Lauraine Moores, PA-C did assist me throughout the case. An assistant was necessary given the difficulty in approach, maintenance of reduction and ability to instrument the fracture.   Franky Light, MD [facility]

## 2023-10-15 NOTE — Progress Notes (Signed)
 Pt placed on tele box 6N14. Called central monitoring and made them aware

## 2023-10-15 NOTE — Progress Notes (Signed)
 Pt arrived back to 6 north room 13 alert and oriented x. Pain level 8/10 in right surgical leg. Right leg wrapped with compression wrap and wound vac in place and plugged to wall in room. Bed in lowest position. Call light in reach. Bed alarm on. All needs met at this time.

## 2023-10-15 NOTE — Anesthesia Procedure Notes (Signed)
 Procedure Name: Intubation Date/Time: 10/15/2023 8:39 AM  Performed by: Boyce Shilling, CRNAPre-anesthesia Checklist: Patient identified, Emergency Drugs available, Suction available, Timeout performed and Patient being monitored Patient Re-evaluated:Patient Re-evaluated prior to induction Oxygen Delivery Method: Circle system utilized Preoxygenation: Pre-oxygenation with 100% oxygen Induction Type: IV induction Ventilation: Mask ventilation without difficulty, Two handed mask ventilation required and Oral airway inserted - appropriate to patient size Laryngoscope Size: Mac and 4 Grade View: Grade I Tube type: Oral Tube size: 7.5 mm Number of attempts: 1 Airway Equipment and Method: Stylet Placement Confirmation: ETT inserted through vocal cords under direct vision, positive ETCO2, CO2 detector and breath sounds checked- equal and bilateral Secured at: 22 cm Tube secured with: Tape Dental Injury: Teeth and Oropharynx as per pre-operative assessment  Comments: 2 handed mask due to facial hair/beard

## 2023-10-16 ENCOUNTER — Encounter (HOSPITAL_COMMUNITY): Payer: Self-pay | Admitting: Student

## 2023-10-16 ENCOUNTER — Inpatient Hospital Stay (HOSPITAL_COMMUNITY)

## 2023-10-16 DIAGNOSIS — S81001S Unspecified open wound, right knee, sequela: Secondary | ICD-10-CM

## 2023-10-16 DIAGNOSIS — F17210 Nicotine dependence, cigarettes, uncomplicated: Secondary | ICD-10-CM | POA: Diagnosis not present

## 2023-10-16 DIAGNOSIS — N183 Chronic kidney disease, stage 3 unspecified: Secondary | ICD-10-CM | POA: Diagnosis not present

## 2023-10-16 DIAGNOSIS — S81001A Unspecified open wound, right knee, initial encounter: Secondary | ICD-10-CM

## 2023-10-16 DIAGNOSIS — T84622A Infection and inflammatory reaction due to internal fixation device of right tibia, initial encounter: Principal | ICD-10-CM

## 2023-10-16 DIAGNOSIS — T84622S Infection and inflammatory reaction due to internal fixation device of right tibia, sequela: Secondary | ICD-10-CM

## 2023-10-16 DIAGNOSIS — R0989 Other specified symptoms and signs involving the circulatory and respiratory systems: Secondary | ICD-10-CM | POA: Diagnosis not present

## 2023-10-16 DIAGNOSIS — S82891D Other fracture of right lower leg, subsequent encounter for closed fracture with routine healing: Secondary | ICD-10-CM

## 2023-10-16 LAB — CBC
HCT: 29.4 % — ABNORMAL LOW (ref 39.0–52.0)
Hemoglobin: 9.2 g/dL — ABNORMAL LOW (ref 13.0–17.0)
MCH: 27.5 pg (ref 26.0–34.0)
MCHC: 31.3 g/dL (ref 30.0–36.0)
MCV: 87.8 fL (ref 80.0–100.0)
Platelets: 207 K/uL (ref 150–400)
RBC: 3.35 MIL/uL — ABNORMAL LOW (ref 4.22–5.81)
RDW: 15.2 % (ref 11.5–15.5)
WBC: 7.4 K/uL (ref 4.0–10.5)
nRBC: 0 % (ref 0.0–0.2)

## 2023-10-16 LAB — COMPREHENSIVE METABOLIC PANEL WITH GFR
ALT: 6 U/L (ref 0–44)
AST: 14 U/L — ABNORMAL LOW (ref 15–41)
Albumin: 2.1 g/dL — ABNORMAL LOW (ref 3.5–5.0)
Alkaline Phosphatase: 67 U/L (ref 38–126)
Anion gap: 9 (ref 5–15)
BUN: 20 mg/dL (ref 8–23)
CO2: 24 mmol/L (ref 22–32)
Calcium: 8.6 mg/dL — ABNORMAL LOW (ref 8.9–10.3)
Chloride: 103 mmol/L (ref 98–111)
Creatinine, Ser: 1.39 mg/dL — ABNORMAL HIGH (ref 0.61–1.24)
GFR, Estimated: 51 mL/min — ABNORMAL LOW
Glucose, Bld: 141 mg/dL — ABNORMAL HIGH (ref 70–99)
Potassium: 5.2 mmol/L — ABNORMAL HIGH (ref 3.5–5.1)
Sodium: 136 mmol/L (ref 135–145)
Total Bilirubin: 0.6 mg/dL (ref 0.0–1.2)
Total Protein: 5 g/dL — ABNORMAL LOW (ref 6.5–8.1)

## 2023-10-16 LAB — MAGNESIUM: Magnesium: 1.5 mg/dL — ABNORMAL LOW (ref 1.7–2.4)

## 2023-10-16 LAB — GLUCOSE, CAPILLARY
Glucose-Capillary: 139 mg/dL — ABNORMAL HIGH (ref 70–99)
Glucose-Capillary: 141 mg/dL — ABNORMAL HIGH (ref 70–99)
Glucose-Capillary: 179 mg/dL — ABNORMAL HIGH (ref 70–99)
Glucose-Capillary: 199 mg/dL — ABNORMAL HIGH (ref 70–99)
Glucose-Capillary: 83 mg/dL (ref 70–99)

## 2023-10-16 MED ORDER — SODIUM ZIRCONIUM CYCLOSILICATE 10 G PO PACK
10.0000 g | PACK | Freq: Once | ORAL | Status: AC
  Administered 2023-10-16: 10 g via ORAL
  Filled 2023-10-16: qty 1

## 2023-10-16 MED ORDER — OXYCODONE HCL 5 MG PO TABS
5.0000 mg | ORAL_TABLET | ORAL | Status: DC | PRN
  Administered 2023-10-16 – 2023-10-24 (×14): 10 mg via ORAL
  Filled 2023-10-16 (×14): qty 2

## 2023-10-16 MED ORDER — MAGNESIUM SULFATE 2 GM/50ML IV SOLN
2.0000 g | Freq: Once | INTRAVENOUS | Status: AC
  Administered 2023-10-16: 2 g via INTRAVENOUS
  Filled 2023-10-16: qty 50

## 2023-10-16 NOTE — Progress Notes (Signed)
 Ortho Progress Note  Doing okay, pain controlled, working with PT  PE: NAD, AAOx3 Wiggles toes, compartments soft and compressible, sensation intact. Wound vac with good seal and serosang output  Results for orders placed or performed during the hospital encounter of 10/14/23 (from the past 24 hours)  Glucose, capillary     Status: Abnormal   Collection Time: 10/15/23  5:08 PM  Result Value Ref Range   Glucose-Capillary 196 (H) 70 - 99 mg/dL  Glucose, capillary     Status: Abnormal   Collection Time: 10/15/23  7:56 PM  Result Value Ref Range   Glucose-Capillary 189 (H) 70 - 99 mg/dL  Glucose, capillary     Status: Abnormal   Collection Time: 10/16/23 12:14 AM  Result Value Ref Range   Glucose-Capillary 179 (H) 70 - 99 mg/dL  CBC     Status: Abnormal   Collection Time: 10/16/23  3:28 AM  Result Value Ref Range   WBC 7.4 4.0 - 10.5 K/uL   RBC 3.35 (L) 4.22 - 5.81 MIL/uL   Hemoglobin 9.2 (L) 13.0 - 17.0 g/dL   HCT 70.5 (L) 60.9 - 47.9 %   MCV 87.8 80.0 - 100.0 fL   MCH 27.5 26.0 - 34.0 pg   MCHC 31.3 30.0 - 36.0 g/dL   RDW 84.7 88.4 - 84.4 %   Platelets 207 150 - 400 K/uL   nRBC 0.0 0.0 - 0.2 %  Comprehensive metabolic panel with GFR     Status: Abnormal   Collection Time: 10/16/23  3:28 AM  Result Value Ref Range   Sodium 136 135 - 145 mmol/L   Potassium 5.2 (H) 3.5 - 5.1 mmol/L   Chloride 103 98 - 111 mmol/L   CO2 24 22 - 32 mmol/L   Glucose, Bld 141 (H) 70 - 99 mg/dL   BUN 20 8 - 23 mg/dL   Creatinine, Ser 8.60 (H) 0.61 - 1.24 mg/dL   Calcium 8.6 (L) 8.9 - 10.3 mg/dL   Total Protein 5.0 (L) 6.5 - 8.1 g/dL   Albumin  2.1 (L) 3.5 - 5.0 g/dL   AST 14 (L) 15 - 41 U/L   ALT 6 0 - 44 U/L   Alkaline Phosphatase 67 38 - 126 U/L   Total Bilirubin 0.6 0.0 - 1.2 mg/dL   GFR, Estimated 51 (L) >60 mL/min   Anion gap 9 5 - 15  Magnesium      Status: Abnormal   Collection Time: 10/16/23  3:28 AM  Result Value Ref Range   Magnesium  1.5 (L) 1.7 - 2.4 mg/dL  Glucose, capillary      Status: Abnormal   Collection Time: 10/16/23  3:39 AM  Result Value Ref Range   Glucose-Capillary 139 (H) 70 - 99 mg/dL  Glucose, capillary     Status: Abnormal   Collection Time: 10/16/23  8:00 AM  Result Value Ref Range   Glucose-Capillary 141 (H) 70 - 99 mg/dL     Cultures with 1/2 with rare GNRs  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  Franky MYRTIS Light, MD [facility] [phone] (office) [email or web address]

## 2023-10-16 NOTE — Progress Notes (Signed)
 PROGRESS NOTE  Joe Coleman FMW:[id number] DOB: [birth year] DOA: 10/14/2023 PCP: [facility]   LOS: 2 days   Brief Narrative / Interim history: 78 year old male with DM2, HTN comes into the hospital with wound dehiscence.  He was admitted for leg injury on July 19 with right tibial fracture, underwent immobilization with a long-leg splint with ORIF 7/21, discharged 7/28.  He was seen in the ED mid August with concern for wound infection and started on doxycycline .  Did not really improve and came back to the hospital.  Orthopedic surgery consulted  Subjective / 24h Interval events: He reports significant right leg pain.  He is worried about working with PT today  Assesement and Plan: Principal problem Right leg wound/hardware infection -patient was admitted to the hospital and placed on IV antibiotics, continue.  Orthopedic surgery consulted, he was taken to the OR on 9/10 status post hardware removal, I&D right proximal tibia, skin graft, long-leg splint and wound VAC placement - Continue to monitor postoperatively, intraoperative cultures are still pending.  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  Hold metformin   Lab Results  Component Value Date   HGBA1C 7.6 (H) 08/10/2023    Scheduled Meds:  acetaminophen   650 mg Oral Q6H   Or   acetaminophen   650 mg Rectal Q6H   aspirin   325 mg Oral Daily   budesonide -glycopyrrolate -formoterol   2 puff Inhalation BID   docusate sodium   100 mg Oral BID   gabapentin   300 mg Oral QHS   pantoprazole   40 mg Oral Daily   Continuous  Infusions:  ceFEPime  (MAXIPIME ) IV 2 g (10/16/23 0501)   vancomycin  750 mg (10/15/23 1706)   PRN Meds:.albuterol , bisacodyl , diphenhydrAMINE , hydrALAZINE , insulin  aspart, methocarbamol  **OR** methocarbamol  (ROBAXIN ) injection, metoCLOPramide  **OR** metoCLOPramide  (REGLAN ) injection, morphine  injection, nicotine , ondansetron  **OR** ondansetron  (ZOFRAN ) IV, oxyCODONE , polyethylene glycol  Current Outpatient Medications  Medication Instructions   albuterol  (PROVENTIL  HFA;VENTOLIN  HFA) 108 (90 Base) MCG/ACT inhaler 2 puffs, Inhalation, Every 6 hours PRN   doxycycline  (VIBRA -TABS) 100 mg, Oral, 2 times daily   feeding supplement, GLUCERNA SHAKE, (GLUCERNA SHAKE) LIQD 237 mLs, Oral, 3 times daily between meals   gabapentin  (NEURONTIN ) 300 mg, Daily at bedtime   HYDROcodone-acetaminophen  (NORCO/VICODIN) 5-325 MG tablet 1 tablet, Oral, Every 8 hours PRN   ipratropium-albuterol  (DUONEB) 0.5-2.5 (3) MG/3ML SOLN 3 mLs, Nebulization, Every 4 hours PRN   metFORMIN  (GLUCOPHAGE ) 500 mg, 2 times daily with meals   methocarbamol  (ROBAXIN ) 500 mg, Oral, Every 6 hours PRN   oxyCODONE  (ROXICODONE ) 2.5-5 mg, Oral, Every 4 hours PRN   pantoprazole  (PROTONIX ) 40 mg, Daily   polyethylene glycol (MIRALAX  / GLYCOLAX ) 17 g, Oral, Daily   senna-docusate (SENOKOT-S) 8.6-50 MG tablet 1 tablet, Oral, Daily at bedtime   TRELEGY ELLIPTA 200-62.5-25 MCG/ACT AEPB 1 puff, Daily   vitamin D3 (CHOLECALCIFEROL ) 1,000 Units, Oral, Daily    Diet Orders (From admission, onward)     Start     Ordered   10/15/23 1106  Diet Carb Modified Fluid consistency: Thin; Room service appropriate? Yes  Diet effective now       Question Answer Comment  Calorie Level Medium 1600-2000   Fluid consistency: Thin   Room service appropriate? Yes      10/15/23 1106            DVT prophylaxis: SCDs Start: 10/15/23 1137   Lab Results  Component Value Date   PLT 207 10/16/2023      Code Status: Full Code  Family Communication: no  family at bedside   Status is: Inpatient Remains inpatient appropriate because: severity of illness  Level of care: Telemetry Medical  Consultants:  Orthopedic surgery  Objective: Vitals:   10/16/23 0022 10/16/23 0343 10/16/23 0801 10/16/23 0834  BP: 120/67 120/66 126/74   Pulse: 76 74 78   Resp: 20 17    Temp: 98 F (36.7 C) 98.1 F (36.7 C) 98 F (36.7 C)   TempSrc: Oral Oral Oral   SpO2: 95% 96% 99% 99%  Weight:      Height:        Intake/Output Summary (Last 24 hours) at 10/16/2023 1016 Last data filed at 10/16/2023 0343 Gross per 24 hour  Intake 1070.38 ml  Output 200 ml  Net 870.38 ml   Wt Readings from Last 3 Encounters:  10/14/23 65.8 kg  09/24/23 68 kg  08/23/23 68 kg    Examination:  Constitutional: NAD Eyes: lids and conjunctivae normal, no scleral icterus ENMT: mmm Neck: normal, supple Respiratory: clear to auscultation bilaterally, no wheezing, no crackles. Normal respiratory effort.  Cardiovascular: Regular rate and rhythm, no murmurs / rubs / gallops. No LE edema. Abdomen: soft, no distention, no tenderness. Bowel sounds positive.    Data Reviewed: I have independently reviewed following labs and imaging studies   CBC Recent Labs  Lab 10/14/23 1445 10/16/23 0328  WBC 8.7 7.4  HGB 11.3* 9.2*  HCT 39.1 29.4*  PLT 247 207  MCV 94.7 87.8  MCH 27.4 27.5  MCHC 28.9* 31.3  RDW 15.8* 15.2  LYMPHSABS 1.1  --   MONOABS 0.8  --   EOSABS 0.4  --   BASOSABS 0.1  --     Recent Labs  Lab 10/14/23 1445 10/16/23 0328  NA 136 136  K 4.9 5.2*  CL 106 103  CO2 20* 24  GLUCOSE 111* 141*  BUN 24* 20  CREATININE 1.62* 1.39*  CALCIUM 8.8* 8.6*  AST 25 14*  ALT 9 6  ALKPHOS 104 67  BILITOT 0.5 0.6  ALBUMIN  2.9* 2.1*  MG  --  1.5*    ------------------------------------------------------------------------------------------------------------------ No results for input(s): CHOL, HDL, LDLCALC, TRIG, CHOLHDL, LDLDIRECT in the last  72 hours.  Lab Results  Component Value Date   HGBA1C 7.6 (H) 08/10/2023   ------------------------------------------------------------------------------------------------------------------ No results for input(s): TSH, T4TOTAL, T3FREE, THYROIDAB in the last 72 hours.  Invalid input(s): FREET3  Cardiac Enzymes No results for input(s): CKMB, TROPONINI, MYOGLOBIN in the last 168 hours.  Invalid input(s): CK ------------------------------------------------------------------------------------------------------------------    Component Value Date/Time   BNP 69.0 04/17/2016 0422    CBG: Recent Labs  Lab 10/15/23 1708 10/15/23 1956 10/16/23 0014 10/16/23 0339 10/16/23 0800  GLUCAP 196* 189* 179* 139* 141*    Recent Results (from the past 240 hours)  Blood culture (routine x 2)     Status: None (Preliminary result)   Collection Time: 10/14/23  2:45 PM   Specimen: BLOOD  Result Value Ref Range Status   Specimen Description BLOOD SITE NOT SPECIFIED  Final   Special Requests   Final    BOTTLES DRAWN AEROBIC AND ANAEROBIC Blood Culture  results may not be optimal due to an inadequate volume of blood received in culture bottles   Culture   Final    NO GROWTH 2 DAYS Performed at [facility], [address], [place], [place] [address]    Report Status PENDING  Incomplete  MRSA Next Gen by PCR, Nasal     Status: None   Collection Time: 10/15/23  6:02 AM   Specimen: Nasal Mucosa; Nasal Swab  Result Value Ref Range Status   MRSA by PCR Next Gen NOT DETECTED NOT DETECTED Final    Comment: (NOTE) The GeneXpert MRSA Assay (FDA approved for NASAL specimens only), is one component of a comprehensive MRSA colonization surveillance program. It is not intended to diagnose MRSA infection nor to guide or monitor treatment for MRSA infections. Test performance is not FDA approved in patients less than 102 years old. Performed at [facility], [address], [place], [place] [address]   Aerobic/Anaerobic Culture w Gram Stain (surgical/deep wound)     Status: None (Preliminary result)   Collection Time: 10/15/23  9:22 AM   Specimen: Leg, Right; Tissue  Result Value Ref Range Status   Specimen Description TISSUE  Final   Special Requests RIGHT TIBIAL INFECTION 1  Final   Gram Stain   Final    NO WBC SEEN NO ORGANISMS SEEN Performed at [facility], [address] [place], [place] [address]    Culture RARE GRAM NEGATIVE RODS  Final   Report Status PENDING  Incomplete  Aerobic/Anaerobic Culture w Gram Stain (surgical/deep wound)     Status: None (Preliminary result)   Collection Time: 10/15/23  9:24 AM   Specimen: Leg, Right; Tissue  Result Value Ref Range Status   Specimen Description TISSUE  Final   Special Requests RIGHT TIBIAL INFECTION 2  Final   Gram Stain   Final    NO WBC SEEN RARE GRAM POSITIVE COCCI IN CLUSTERS    Culture   Final    CULTURE REINCUBATED FOR BETTER GROWTH Performed at [facility], [address], [place], [place] [address]    Report Status PENDING  Incomplete     Radiology Studies: DG Tibia/Fibula Right Port Result Date: 10/15/2023 CLINICAL DATA:  Left tibial and fibular fractures. EXAM: PORTABLE RIGHT TIBIA AND FIBULA - 2 VIEW COMPARISON:  October 14, 2023.  August 25, 2023. FINDINGS: The right lower leg has been casted and immobilized status post surgical removal of fixation hardware. Mildly to moderately displaced and comminuted fracture is seen involving the proximal right tibia, with mildly displaced proximal right fibula fracture. IMPRESSION: Casted and immobilized right lower leg status post surgical removal of fixation hardware. Electronically Signed   By: Lynwood Landy Raddle M.D.   On: 10/15/2023 13:37     Nilda Fendt, MD, PhD [facility]  Between 7 am - 7 pm I am available, please contact me via Amion (for emergencies) or Securechat (non urgent messages)  Between 7 pm - 7 am I  am not available, please contact night coverage MD/APP via Amion

## 2023-10-16 NOTE — Evaluation (Signed)
 Physical Therapy Evaluation Patient Details Name: Joe Coleman MRN: [id number] DOB: [birth year] Today's Date: 10/16/2023  History of Present Illness  Pt is 78 yo male who presents on 10/14/23 with surgical wound dehiscence R knee with surgical hardware visible. Pt had R tib fx with ORIF 7/25 with wound infection noted 8/28. On 9/10 pt underwent removal of hardware, I&D, skin graft, and wound vac placement. PMH: DM2, COPD, HTN, past tobacco abuse  Clinical Impression  Pt admitted with above diagnosis. Pt from home, niece checks on him regularly. Pt would like to go home but explained that he may need IV antibiotics in addition to having wound vac and pt agreeable to SNF but does not want to go to the same one he went to last time. Pt needed min A +2 to come to EOB and pivot to recliner with RW. Patient will benefit from continued inpatient follow up therapy, <3 hours/day  Pt currently with functional limitations due to the deficits listed below (see PT Problem List). Pt will benefit from acute skilled PT to increase their independence and safety with mobility to allow discharge.           If plan is discharge home, recommend the following: A lot of help with walking and/or transfers;A little help with bathing/dressing/bathroom;Assistance with cooking/housework;Assist for transportation   Can travel by private vehicle   No    Equipment Recommendations None recommended by PT  Recommendations for Other Services       Functional Status Assessment Patient has had a recent decline in their functional status and demonstrates the ability to make significant improvements in function in a reasonable and predictable amount of time.     Precautions / Restrictions Precautions Precautions: Fall Recall of Precautions/Restrictions: Intact Precaution/Restrictions Comments: after initial surgery, fell, causing wound that has now dehisced Required Braces or Orthoses: Splint/Cast Splint/Cast: RLE long leg  splint (prevents knee flexion) Splint/Cast - Date Prophylactic Dressing Applied (if applicable): 10/15/23 Restrictions Weight Bearing Restrictions Per Provider Order: Yes RLE Weight Bearing Per Provider Order: Non weight bearing      Mobility  Bed Mobility Overal bed mobility: Needs Assistance Bed Mobility: Supine to Sit     Supine to sit: Min assist, +2 for physical assistance     General bed mobility comments: min A from behind trunk and min A to support RLE all the way to floor    Transfers Overall transfer level: Needs assistance Equipment used: Rolling walker (2 wheels) Transfers: Sit to/from Stand Sit to Stand: Min assist, +2 physical assistance           General transfer comment: min A to steady and min A to support RLE for tolerance of pain    Ambulation/Gait               General Gait Details: pt not ready to go farther yet  Stairs            Wheelchair Mobility     Tilt Bed    Modified Rankin (Stroke Patients Only)       Balance Overall balance assessment: Needs assistance Sitting-balance support: No upper extremity supported, Feet supported Sitting balance-Leahy Scale: Good     Standing balance support: Bilateral upper extremity supported, During functional activity, Reliant on assistive device for balance Standing balance-Leahy Scale: Poor                               Pertinent Vitals/Pain  Pain Assessment Pain Assessment: 0-10 Pain Score: 10-Worst pain ever Pain Location: RLE Pain Descriptors / Indicators: Aching, Sore, Operative site guarding Pain Intervention(s): Limited activity within patient's tolerance, Monitored during session, Premedicated before session    Home Living Family/patient expects to be discharged to:: Private residence Living Arrangements: Alone Available Help at Discharge: Family;Available PRN/intermittently (niece) Type of Home: Apartment Home Access: Stairs to enter Entrance  Stairs-Rails: None Entrance Stairs-Number of Steps: 1   Home Layout: One level Home Equipment: Rollator (4 wheels);Rolling Walker (2 wheels)      Prior Function Prior Level of Function : Independent/Modified Independent;Driving;Working/employed             Mobility Comments: had begun putting wt on RLE with RW when he fell and sustained wound in 7/25 ADLs Comments: ind     Extremity/Trunk Assessment   Upper Extremity Assessment Upper Extremity Assessment: Defer to OT evaluation    Lower Extremity Assessment Lower Extremity Assessment: RLE deficits/detail RLE Deficits / Details: hip flex 2/5, , able to tolerate R hip IR/ER, able to wiggle toes RLE: Unable to fully assess due to pain;Unable to fully assess due to immobilization RLE Sensation: history of peripheral neuropathy RLE Coordination: decreased gross motor    Cervical / Trunk Assessment Cervical / Trunk Assessment: Normal  Communication   Communication Communication: No apparent difficulties    Cognition Arousal: Alert Behavior During Therapy: WFL for tasks assessed/performed   PT - Cognitive impairments: Safety/Judgement                       PT - Cognition Comments: his determination to go home somewhat keeps him from accepting that he needs assist with IV ABX Following commands: Intact       Cueing Cueing Techniques: Verbal cues, Tactile cues     General Comments General comments (skin integrity, edema, etc.): Haddix present during session and confirmed that pt's splint prevents knee flexion so he does not need a KI. Will likely need IV ABX for a few weeks, spoke to pt's niece about this on the phone. Pt reports that he has O2 at home that he uses intermittently. Could not get pulse ox to read but pt was conversant without SOB on RA.    Exercises Other Exercises Other Exercises: toe wiggle x10 Other Exercises: R hip IR/ER x5 Other Exercises: quad set x5   Assessment/Plan    PT  Assessment Patient needs continued PT services  PT Problem List Decreased strength;Decreased activity tolerance;Decreased range of motion;Decreased balance;Decreased mobility;Decreased coordination;Pain;Decreased skin integrity       PT Treatment Interventions DME instruction;Gait training;Functional mobility training;Therapeutic activities;Therapeutic exercise;Balance training;Patient/family education    PT Goals (Current goals can be found in the Care Plan section)  Acute Rehab PT Goals Patient Stated Goal: return home PT Goal Formulation: With patient Time For Goal Achievement: 10/30/23 Potential to Achieve Goals: Good    Frequency Min 2X/week     Co-evaluation               AM-PAC PT 6 Clicks Mobility  Outcome Measure Help needed turning from your back to your side while in a flat bed without using bedrails?: A Little Help needed moving from lying on your back to sitting on the side of a flat bed without using bedrails?: A Little Help needed moving to and from a bed to a chair (including a wheelchair)?: A Little Help needed standing up from a chair using your arms (e.g., wheelchair or bedside chair)?:  A Lot Help needed to walk in hospital room?: A Lot Help needed climbing 3-5 steps with a railing? : Total 6 Click Score: 14    End of Session Equipment Utilized During Treatment: Gait belt Activity Tolerance: Patient tolerated treatment well Patient left: in chair;with call bell/phone within reach;with chair alarm set Nurse Communication: Mobility status PT Visit Diagnosis: Unsteadiness on feet (R26.81);History of falling (Z91.81);Difficulty in walking, not elsewhere classified (R26.2);Pain Pain - Right/Left: Right Pain - part of body: Leg    Time: 9074-9054 PT Time Calculation (min) (ACUTE ONLY): 20 min   Charges:   PT Evaluation $PT Eval Moderate Complexity: 1 Mod   PT General Charges $$ ACUTE PT VISIT: 1 Visit         Richerd Lipoma, PT  Acute Rehab  Services Secure chat preferred Office [phone]   Richerd CROME Keimani Laufer 10/16/2023, 12:36 PM

## 2023-10-16 NOTE — Plan of Care (Signed)
  Problem: Pain Managment: Goal: General experience of comfort will improve and/or be controlled Outcome: Progressing   Problem: Safety: Goal: Ability to remain free from injury will improve Outcome: Progressing

## 2023-10-16 NOTE — Evaluation (Signed)
 Occupational Therapy Evaluation Patient Details Name: Joe Coleman MRN: [id number] DOB: Nov 30, 1945 Today's Date: 10/16/2023   History of Present Illness   Pt is 78 yo male who presents on 10/14/23 with surgical wound dehiscence R knee with surgical hardware visible. Pt had R tib fx with ORIF 7/25 with wound infection noted 8/28. On 9/10 pt underwent removal of hardware, I&D, skin graft, and wound vac placement. PMH: DM2, COPD, HTN, past tobacco abuse     Clinical Impressions Pt reports having been home from SNF x 2 weeks prior to admission. He reports inability to perform LB bathing and dressing in those 2 weeks. His niece assisted with groceries/meals. He was primarily functioning from a wheelchair level. Prior to his initial injury in July, pt was independent, driving and working. Pt presents with R LE pain, generalized weakness and impaired standing balance. He needs +2 min assist for OOB to chair with RW and set up to total assist for ADLs. Patient will benefit from continued inpatient follow up therapy, <3 hours/day as he lives alone and may require IV antibiotic administrattion, pt and niece are agreeable.       If plan is discharge home, recommend the following:   Two people to help with walking and/or transfers;A lot of help with bathing/dressing/bathroom;Assistance with cooking/housework;Assist for transportation;Help with stairs or ramp for entrance     Functional Status Assessment   Patient has had a recent decline in their functional status and demonstrates the ability to make significant improvements in function in a reasonable and predictable amount of time.     Equipment Recommendations   None recommended by OT     Recommendations for Other Services         Precautions/Restrictions   Precautions Precautions: Fall Recall of Precautions/Restrictions: Intact Required Braces or Orthoses: Splint/Cast Splint/Cast: RLE long leg splint (prevents knee  flexion) Splint/Cast - Date Prophylactic Dressing Applied (if applicable): 10/15/23 Restrictions Weight Bearing Restrictions Per Provider Order: Yes RLE Weight Bearing Per Provider Order: Non weight bearing     Mobility Bed Mobility Overal bed mobility: Needs Assistance Bed Mobility: Supine to Sit     Supine to sit: Min assist, +2 for physical assistance     General bed mobility comments: min A from behind trunk and min A to support RLE all the way to floor    Transfers Overall transfer level: Needs assistance Equipment used: Rolling walker (2 wheels) Transfers: Sit to/from Stand Sit to Stand: Min assist, +2 physical assistance           General transfer comment: min A to steady and min A to support RLE for tolerance of pain      Balance Overall balance assessment: Needs assistance Sitting-balance support: No upper extremity supported, Feet supported Sitting balance-Leahy Scale: Good     Standing balance support: Bilateral upper extremity supported, During functional activity, Reliant on assistive device for balance Standing balance-Leahy Scale: Poor                             ADL either performed or assessed with clinical judgement   ADL Overall ADL's : Needs assistance/impaired Eating/Feeding: Independent;Sitting   Grooming: Set up;Sitting   Upper Body Bathing: Minimal assistance;Sitting   Lower Body Bathing: Total assistance;Sit to/from stand   Upper Body Dressing : Minimal assistance;Sitting   Lower Body Dressing: Total assistance;+2 for physical assistance;Sit to/from stand   Toilet Transfer: Minimal assistance;Stand-pivot;Rolling walker (2 wheels)  Functional mobility during ADLs: Minimal assistance;+2 for physical assistance;Rolling walker (2 wheels)       Vision Baseline Vision/History: 1 Wears glasses Ability to See in Adequate Light: 0 Adequate Patient Visual Report: No change from baseline Additional Comments:  reading glasses     Perception         Praxis         Pertinent Vitals/Pain Pain Assessment Pain Assessment: Faces Pain Score: 10-Worst pain ever Pain Location: RLE Pain Descriptors / Indicators: Aching, Sore, Operative site guarding Pain Intervention(s): Limited activity within patient's tolerance, Repositioned, Premedicated before session     Extremity/Trunk Assessment Upper Extremity Assessment Upper Extremity Assessment: Overall WFL for tasks assessed   Lower Extremity Assessment Lower Extremity Assessment: Defer to PT evaluation RLE Deficits / Details: hip flex 2/5, , able to tolerate R hip IR/ER, able to wiggle toes RLE: Unable to fully assess due to pain;Unable to fully assess due to immobilization RLE Sensation: history of peripheral neuropathy RLE Coordination: decreased gross motor   Cervical / Trunk Assessment Cervical / Trunk Assessment: Normal   Communication Communication Communication: No apparent difficulties   Cognition Arousal: Alert Behavior During Therapy: WFL for tasks assessed/performed Cognition: No family/caregiver present to determine baseline, Cognition impaired     Awareness: Intellectual awareness intact, Online awareness impaired     Executive functioning impairment (select all impairments): Sequencing, Problem solving, Reasoning OT - Cognition Comments: reports having kept the same sock on x 2 weeks after returning home due to inability to complete on his own, pt appears generally disheveled                 Following commands: Intact       Cueing  General Comments   Cueing Techniques: Verbal cues;Tactile cues  Haddix present during session and confirmed that pt's splint prevents knee flexion so he does not need a KI. Will likely need IV ABX for a few weeks, spoke to pt's niece about this on the phone. Pt reports that he has O2 at home that he uses intermittently. Could not get pulse ox to read but pt was conversant without  SOB on RA.   Exercises     Shoulder Instructions      Home Living Family/patient expects to be discharged to:: Private residence Living Arrangements: Alone Available Help at Discharge: Family;Available PRN/intermittently (niece) Type of Home: Apartment Home Access: Stairs to enter Entrance Stairs-Number of Steps: 1 Entrance Stairs-Rails: None Home Layout: One level     Bathroom Shower/Tub: Tub/shower unit;Walk-in shower   Bathroom Toilet: Standard     Home Equipment: Rollator (4 wheels);Rolling Walker (2 wheels)          Prior Functioning/Environment Prior Level of Function : Independent/Modified Independent;Driving;Working/employed             Mobility Comments: had begun putting wt on RLE with RW when he fell and sustained wound in 7/25 ADLs Comments: ind    OT Problem List: Decreased strength;Impaired balance (sitting and/or standing);Pain;Decreased knowledge of use of DME or AE   OT Treatment/Interventions: Self-care/ADL training;DME and/or AE instruction;Therapeutic activities;Patient/family education;Balance training      OT Goals(Current goals can be found in the care plan section)   Acute Rehab OT Goals OT Goal Formulation: With patient Time For Goal Achievement: 10/30/23 Potential to Achieve Goals: Good ADL Goals Pt Will Perform Lower Body Bathing: with min assist;with adaptive equipment;sitting/lateral leans Pt Will Perform Lower Body Dressing: with min assist;sitting/lateral leans;with adaptive equipment Pt Will Transfer to Toilet:  with min assist;ambulating;bedside commode Pt Will Perform Toileting - Clothing Manipulation and hygiene: with min assist;sit to/from stand Additional ADL Goal #1: Pt will complete bed mobility mod I in preparation for ADLs.   OT Frequency:  Min 2X/week    Co-evaluation              AM-PAC OT 6 Clicks Daily Activity     Outcome Measure Help from another person eating meals?: None Help from another person  taking care of personal grooming?: A Little Help from another person toileting, which includes using toliet, bedpan, or urinal?: A Lot Help from another person bathing (including washing, rinsing, drying)?: A Lot Help from another person to put on and taking off regular upper body clothing?: A Little Help from another person to put on and taking off regular lower body clothing?: A Lot 6 Click Score: 16   End of Session Equipment Utilized During Treatment: Gait belt;Rolling walker (2 wheels) Nurse Communication: Mobility status  Activity Tolerance: Patient tolerated treatment well Patient left: in chair;with call bell/phone within reach;with chair alarm set  OT Visit Diagnosis: Unsteadiness on feet (R26.81);Other abnormalities of gait and mobility (R26.89);Pain                Time: 9071-8995 OT Time Calculation (min): 36 min Charges:  OT General Charges $OT Visit: 1 Visit OT Evaluation $OT Eval Moderate Complexity: 1 Mod  Mliss HERO, OTR/L Acute Rehabilitation Services Office: [phone]   Kennth Mliss Helling 10/16/2023, 2:29 PM

## 2023-10-17 ENCOUNTER — Inpatient Hospital Stay (HOSPITAL_COMMUNITY)

## 2023-10-17 ENCOUNTER — Other Ambulatory Visit: Payer: Self-pay

## 2023-10-17 DIAGNOSIS — S81001S Unspecified open wound, right knee, sequela: Secondary | ICD-10-CM | POA: Diagnosis not present

## 2023-10-17 LAB — BASIC METABOLIC PANEL WITH GFR
Anion gap: 7 (ref 5–15)
BUN: 27 mg/dL — ABNORMAL HIGH (ref 8–23)
CO2: 25 mmol/L (ref 22–32)
Calcium: 8.3 mg/dL — ABNORMAL LOW (ref 8.9–10.3)
Chloride: 104 mmol/L (ref 98–111)
Creatinine, Ser: 1.64 mg/dL — ABNORMAL HIGH (ref 0.61–1.24)
GFR, Estimated: 42 mL/min — ABNORMAL LOW (ref >60)
Glucose, Bld: 131 mg/dL — ABNORMAL HIGH (ref 70–99)
Potassium: 4.9 mmol/L (ref 3.5–5.1)
Sodium: 136 mmol/L (ref 135–145)

## 2023-10-17 LAB — CBC
HCT: 27.4 % — ABNORMAL LOW (ref 39.0–52.0)
Hemoglobin: 8.4 g/dL — ABNORMAL LOW (ref 13.0–17.0)
MCH: 27.1 pg (ref 26.0–34.0)
MCHC: 30.7 g/dL (ref 30.0–36.0)
MCV: 88.4 fL (ref 80.0–100.0)
Platelets: 204 K/uL (ref 150–400)
RBC: 3.1 MIL/uL — ABNORMAL LOW (ref 4.22–5.81)
RDW: 15.7 % — ABNORMAL HIGH (ref 11.5–15.5)
WBC: 7.3 K/uL (ref 4.0–10.5)
nRBC: 0 % (ref 0.0–0.2)

## 2023-10-17 LAB — GLUCOSE, CAPILLARY
Glucose-Capillary: 121 mg/dL — ABNORMAL HIGH (ref 70–99)
Glucose-Capillary: 129 mg/dL — ABNORMAL HIGH (ref 70–99)
Glucose-Capillary: 142 mg/dL — ABNORMAL HIGH (ref 70–99)
Glucose-Capillary: 144 mg/dL — ABNORMAL HIGH (ref 70–99)
Glucose-Capillary: 154 mg/dL — ABNORMAL HIGH (ref 70–99)
Glucose-Capillary: 155 mg/dL — ABNORMAL HIGH (ref 70–99)

## 2023-10-17 LAB — MAGNESIUM: Magnesium: 2 mg/dL (ref 1.7–2.4)

## 2023-10-17 MED ORDER — GUAIFENESIN-DM 100-10 MG/5ML PO SYRP
5.0000 mL | ORAL_SOLUTION | ORAL | Status: DC | PRN
  Administered 2023-10-17 – 2023-10-23 (×4): 5 mL via ORAL
  Filled 2023-10-17 (×4): qty 5

## 2023-10-17 MED ORDER — SODIUM CHLORIDE 0.9% FLUSH: 10.0000 mL | INTRAVENOUS | Status: DC | PRN

## 2023-10-17 MED ORDER — CHLORHEXIDINE GLUCONATE CLOTH 2 % EX PADS
6.0000 | MEDICATED_PAD | Freq: Every day | CUTANEOUS | Status: DC
  Administered 2023-10-17 – 2023-10-24 (×8): 6 via TOPICAL

## 2023-10-17 MED ORDER — DAPTOMYCIN-SODIUM CHLORIDE 500-0.9 MG/50ML-% IV SOLN
8.0000 mg/kg | Freq: Every day | INTRAVENOUS | Status: DC
  Administered 2023-10-17 – 2023-10-24 (×8): 500 mg via INTRAVENOUS
  Filled 2023-10-17 (×8): qty 50

## 2023-10-17 NOTE — Progress Notes (Signed)
 Peripherally Inserted Central Catheter Placement  The IV Nurse has discussed with the patient and/or persons authorized to consent for the patient, the purpose of this procedure and the potential benefits and risks involved with this procedure.  The benefits include less needle sticks, lab draws from the catheter, and the patient may be discharged home with the catheter. Risks include, but not limited to, infection, bleeding, blood clot (thrombus formation), and puncture of an artery; nerve damage and irregular heartbeat and possibility to perform a PICC exchange if needed/ordered by physician.  Alternatives to this procedure were also discussed.  Bard Power PICC patient education guide, fact sheet on infection prevention and patient information card has been provided to patient /or left at bedside.    PICC Placement Documentation  PICC Single Lumen 10/17/23 Left Basilic 44 cm 0 cm (Active)  Indication for Insertion or Continuance of Line Prolonged intravenous therapies 10/17/23 1116  Exposed Catheter (cm) 0 cm 10/17/23 1116  Site Assessment Clean 10/17/23 1116  Line Status Flushed;Saline locked;Blood return noted 10/17/23 1116  Dressing Type Transparent;Securing device 10/17/23 1116  Dressing Status Antimicrobial disc/dressing in place;Clean, Dry, Intact 10/17/23 1116  Line Care Connections checked and tightened 10/17/23 1116  Line Adjustment (NICU/IV Team Only) No 10/17/23 1116  Dressing Intervention New dressing;Adhesive placed at insertion site (IV team only) 10/17/23 1116  Dressing Change Due 10/24/23 10/17/23 1116       Renaee Notice Albarece 10/17/2023, 11:16 AM

## 2023-10-17 NOTE — Consult Note (Signed)
 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 male with past medical history of diabetes mellitus, hypertension, tobacco abuse, leg injury requiring hospitalization July 19 underwent immobilization long-leg splint with ORIF on 7/21 that was seen in the ED 8/20 for concern of wound infection starting doxycycline  brought in by EMS for wound dehiscence. Taken to the OR on 9/10 with [name] orthopedics for debridement with removal of hardware.  ID engaged for antibiotic recommendations  Review of Systems: Review of Systems  All other systems reviewed and are negative.   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   Vaping status: Never Used  Substance Use Topics   Alcohol use: No   Drug use: No    Family History  Problem Relation Age of Onset   Heart disease Mother    Heart disease Brother    Scheduled Meds:  acetaminophen   650 mg Oral Q6H   Or   acetaminophen   650 mg Rectal Q6H   aspirin   325 mg Oral Daily   budesonide -glycopyrrolate -formoterol   2 puff Inhalation BID   docusate sodium   100 mg Oral BID   gabapentin   300 mg Oral QHS   pantoprazole   40 mg Oral Daily   Continuous Infusions:  ceFEPime  (MAXIPIME ) IV 2 g (10/17/23 0524)   vancomycin  750 mg (10/16/23 1703)   PRN Meds:.albuterol , bisacodyl , diphenhydrAMINE , guaiFENesin -dextromethorphan , hydrALAZINE , insulin  aspart, methocarbamol  **OR** methocarbamol  (ROBAXIN ) injection, metoCLOPramide  **OR** metoCLOPramide  (REGLAN ) injection, morphine  injection, nicotine , ondansetron  **OR** ondansetron  (ZOFRAN ) IV, oxyCODONE , polyethylene glycol Allergies  Allergen Reactions   Losartan Other (See Comments)    Hyperkalemia (>5.5 multiple times on losartan 25mg )   Chicken Allergy Nausea And Vomiting        Poultry Meal Nausea And Vomiting    OBJECTIVE: Blood pressure 122/85, pulse 73, temperature 98.1 F (36.7 C), temperature source Oral, resp. rate 17, height 5' 8 (1.727 m), weight 65.8 kg, SpO2 97%.  Physical Exam Constitutional:      General: He is not in acute distress.    Appearance: He is normal weight. He is not toxic-appearing.  HENT:     Head: Normocephalic and atraumatic.     Right  Ear: External ear normal.     Left Ear: External ear normal.     Nose: No congestion or rhinorrhea.     Mouth/Throat:     Mouth: Mucous membranes are moist.     Pharynx: Oropharynx is clear.  Eyes:     Extraocular Movements: Extraocular movements intact.     Conjunctiva/sclera: Conjunctivae normal.     Pupils: Pupils are equal, round, and reactive to light.  Cardiovascular:     Rate and Rhythm: Normal rate and regular rhythm.      Heart sounds: No murmur heard.    No friction rub. No gallop.  Pulmonary:     Effort: Pulmonary effort is normal.     Breath sounds: Normal breath sounds.  Abdominal:     General: Abdomen is flat. Bowel sounds are normal.     Palpations: Abdomen is soft.  Musculoskeletal:        General: No swelling.     Cervical back: Normal range of motion and neck supple.     Comments: Right leg bandaged  Skin:    General: Skin is warm and dry.  Neurological:     General: No focal deficit present.     Mental Status: He is oriented to person, place, and time.  Psychiatric:        Mood and Affect: Mood normal.     Lab Results Lab Results  Component Value Date   WBC 7.3 10/17/2023   HGB 8.4 (L) 10/17/2023   HCT 27.4 (L) 10/17/2023   MCV 88.4 10/17/2023   PLT 204 10/17/2023    Lab Results  Component Value Date   CREATININE 1.64 (H) 10/17/2023   BUN 27 (H) 10/17/2023   NA 136 10/17/2023   K 4.9 10/17/2023   CL 104 10/17/2023   CO2 25 10/17/2023    Lab Results  Component Value Date   ALT 6 10/16/2023   AST 14 (L) 10/16/2023   ALKPHOS 67 10/16/2023   BILITOT 0.6 10/16/2023       Joe Stank, MD [facility] 10/17/2023, 5:38 AM Evaluation of this patient requires complex antimicrobial therapy evaluation and counseling + isolation needs for disease transmission risk assessment and mitigation

## 2023-10-17 NOTE — Progress Notes (Signed)
 PHARMACY CONSULT NOTE FOR:  OUTPATIENT  PARENTERAL ANTIBIOTIC THERAPY (OPAT)  Indication: RIGHT TIBIAL INFECTION  Regimen: daptomycin  500mg  IV q24h + cefepime  2g IV q12h End date: 11/25/2023  IV antibiotic discharge orders are pended. To discharging provider:  please sign these orders via discharge navigator,  Select New Orders & click on the button choice - Manage This Unsigned Work.     Thank you for allowing pharmacy to be a part of this patient's care.  Celestia Venetia Rush 10/17/2023, 1:14 PM

## 2023-10-17 NOTE — Progress Notes (Signed)
 PROGRESS NOTE  Joe Coleman FMW:[id number] DOB: [birth year] DOA: 10/14/2023 PCP: [facility]   LOS: 3 days   Brief Narrative / Interim history: 78 year old male with DM2, HTN comes into the hospital with wound dehiscence.  He was admitted for leg injury on July 19 with right tibial fracture, underwent immobilization with a long-leg splint with ORIF 7/21, discharged 7/28.  He was seen in the ED mid August with concern for wound infection and started on doxycycline .  Did not really improve and came back to the hospital.  Orthopedic surgery consulted and he was taken to the OR on 9/10  Subjective / 24h Interval events: He reports significant right leg pain.  He is worried about working with PT today  Assesement and Plan: Principal problem Right leg wound/hardware infection -patient was admitted to the hospital and placed on IV antibiotics, continue.  Orthopedic surgery consulted, he was taken to the OR on 9/10 status post hardware removal, I&D right proximal tibia, skin graft, long-leg splint and wound VAC placement - Continue to monitor postoperatively, intraoperative cultures are in process, preliminary results showing Enterobacter, Klebsiella.  ID engaged as well, likely to need a PICC line and prolonged IV antibiotics  Active problems History of tobacco use -no documentation of COPD but he is on inhalers and home O2, continue.  Has increasing cough this morning, he is already on antibiotics as above, obtain a chest x-ray.  He tells me that his cough comes and goes.  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  Hold metformin   Lab Results  Component Value Date   HGBA1C 7.6 (H) 08/10/2023    Scheduled Meds:  acetaminophen   650 mg Oral Q6H   Or   acetaminophen   650 mg Rectal Q6H   aspirin   325 mg Oral Daily   budesonide -glycopyrrolate -formoterol   2 puff Inhalation BID   docusate sodium   100 mg Oral BID   gabapentin   300 mg Oral QHS   pantoprazole   40 mg Oral Daily   Continuous Infusions:  ceFEPime  (MAXIPIME ) IV 2 g (10/17/23 0524)   vancomycin  750 mg (10/16/23 1703)   PRN Meds:.albuterol , bisacodyl , diphenhydrAMINE , guaiFENesin -dextromethorphan , hydrALAZINE , insulin  aspart, methocarbamol  **OR** methocarbamol  (ROBAXIN ) injection, metoCLOPramide  **OR** metoCLOPramide  (REGLAN ) injection, morphine  injection, nicotine , ondansetron  **OR** ondansetron  (ZOFRAN ) IV, oxyCODONE , polyethylene glycol  Current Outpatient Medications  Medication Instructions   albuterol  (PROVENTIL  HFA;VENTOLIN  HFA) 108 (90 Base) MCG/ACT inhaler 2 puffs, Inhalation, Every 6 hours PRN   doxycycline  (VIBRA -TABS) 100 mg, Oral, 2 times daily   feeding supplement, GLUCERNA SHAKE, (GLUCERNA SHAKE) LIQD 237 mLs, Oral, 3 times daily between meals   gabapentin  (NEURONTIN ) 300 mg, Daily at bedtime   HYDROcodone-acetaminophen  (NORCO/VICODIN) 5-325 MG tablet 1 tablet, Oral, Every 8 hours PRN   ipratropium-albuterol  (DUONEB) 0.5-2.5 (3) MG/3ML SOLN 3 mLs, Nebulization, Every 4 hours PRN   metFORMIN  (GLUCOPHAGE ) 500 mg, 2 times daily with meals   methocarbamol  (ROBAXIN ) 500 mg, Oral, Every 6 hours PRN   oxyCODONE  (ROXICODONE ) 2.5-5 mg, Oral, Every 4 hours PRN   pantoprazole  (PROTONIX ) 40 mg, Daily   polyethylene glycol (MIRALAX  / GLYCOLAX ) 17 g, Oral, Daily   senna-docusate (SENOKOT-S) 8.6-50  MG tablet 1 tablet, Oral, Daily at bedtime   TRELEGY ELLIPTA 200-62.5-25 MCG/ACT AEPB 1 puff, Daily   vitamin D3 (CHOLECALCIFEROL ) 1,000 Units, Oral, Daily    Diet Orders (From admission, onward)     Start      Ordered   10/15/23 1106  Diet Carb Modified Fluid consistency: Thin; Room service appropriate? Yes  Diet effective now       Question Answer Comment  Calorie Level Medium 1600-2000   Fluid consistency: Thin   Room service appropriate? Yes      10/15/23 1106            DVT prophylaxis: SCDs Start: 10/15/23 1137   Lab Results  Component Value Date   PLT 204 10/17/2023      Code Status: Full Code  Family Communication: no family at bedside   Status is: Inpatient Remains inpatient appropriate because: severity of illness  Level of care: Telemetry Medical  Consultants:  Orthopedic surgery  Objective: Vitals:   10/16/23 1712 10/16/23 1957 10/17/23 0413 10/17/23 0757  BP: 112/64 115/66 122/85 122/60  Pulse: 70 76 73 72  Resp:  20 17 16   Temp: 98.2 F (36.8 C) 98.1 F (36.7 C)  97.6 F (36.4 C)  TempSrc: Oral Oral Oral Oral  SpO2: 97% 99% 97% 96%  Weight:      Height:        Intake/Output Summary (Last 24 hours) at 10/17/2023 0848 Last data filed at 10/17/2023 0400 Gross per 24 hour  Intake 709.86 ml  Output 840 ml  Net -130.14 ml   Wt Readings from Last 3 Encounters:  10/14/23 65.8 kg  09/24/23 68 kg  08/23/23 68 kg    Examination:  Constitutional: NAD Eyes: lids and conjunctivae normal, no scleral icterus ENMT: mmm Neck: normal, supple Respiratory: clear to auscultation bilaterally, no wheezing, no crackles. Normal respiratory effort.  Cardiovascular: Regular rate and rhythm, no murmurs / rubs / gallops. No LE edema. Abdomen: soft, no distention, no tenderness. Bowel sounds positive.    Data Reviewed: I have independently reviewed following labs and imaging studies   CBC Recent Labs  Lab 10/14/23 1445 10/16/23 0328 10/17/23 0335  WBC 8.7 7.4 7.3  HGB 11.3* 9.2* 8.4*  HCT 39.1 29.4* 27.4*  PLT 247 207 204  MCV 94.7 87.8 88.4  MCH 27.4 27.5 27.1  MCHC 28.9* 31.3 30.7  RDW 15.8* 15.2 15.7*  LYMPHSABS 1.1  --   --   MONOABS 0.8  --   --    EOSABS 0.4  --   --   BASOSABS 0.1  --   --     Recent Labs  Lab 10/14/23 1445 10/16/23 0328 10/17/23 0335  NA 136 136 136  K 4.9 5.2* 4.9  CL 106 103 104  CO2 20* 24 25  GLUCOSE 111* 141* 131*  BUN 24* 20 27*  CREATININE 1.62* 1.39* 1.64*  CALCIUM 8.8* 8.6* 8.3*  AST 25 14*  --   ALT 9 6  --   ALKPHOS 104 67  --   BILITOT 0.5 0.6  --   ALBUMIN  2.9* 2.1*  --   MG  --  1.5* 2.0    ------------------------------------------------------------------------------------------------------------------ No results for input(s): CHOL, HDL, LDLCALC, TRIG, CHOLHDL, LDLDIRECT in the last 72 hours.  Lab Results  Component Value Date   HGBA1C 7.6 (H) 08/10/2023   ------------------------------------------------------------------------------------------------------------------ No results for input(s): TSH, T4TOTAL, T3FREE, THYROIDAB in the last 72 hours.  Invalid input(s): FREET3  Cardiac Enzymes No  results for input(s): CKMB, TROPONINI, MYOGLOBIN in the last 168 hours.  Invalid input(s): CK ------------------------------------------------------------------------------------------------------------------    Component Value Date/Time   BNP 69.0 04/17/2016 0422    CBG: Recent Labs  Lab 10/16/23 1718 10/16/23 1952 10/17/23 0032 10/17/23 0409 10/17/23 0817  GLUCAP 83 199* 155* 129* 142*    Recent Results (from the past 240 hours)  Blood culture (routine x 2)     Status: None (Preliminary result)   Collection Time: 10/14/23  2:45 PM   Specimen: BLOOD  Result Value Ref Range Status   Specimen Description BLOOD SITE NOT SPECIFIED  Final   Special Requests   Final    BOTTLES DRAWN AEROBIC AND ANAEROBIC Blood Culture results may not be optimal due to an inadequate volume of blood received in culture bottles   Culture   Final    NO GROWTH 2 DAYS Performed at [facility], [address], [place], [place] [address]    Report Status  PENDING  Incomplete  MRSA Next Gen by PCR, Nasal     Status: None   Collection Time: 10/15/23  6:02 AM   Specimen: Nasal Mucosa; Nasal Swab  Result Value Ref Range Status   MRSA by PCR Next Gen NOT DETECTED NOT DETECTED Final    Comment: (NOTE) The GeneXpert MRSA Assay (FDA approved for NASAL specimens only), is one component of a comprehensive MRSA colonization surveillance program. It is not intended to diagnose MRSA infection nor to guide or monitor treatment for MRSA infections. Test performance is not FDA approved in patients less than 43 years old. Performed at [facility], [address] [place], [place] [address]   Aerobic/Anaerobic Culture w Gram Stain (surgical/deep wound)     Status: None (Preliminary result)   Collection Time: 10/15/23  9:22 AM   Specimen: Leg, Right; Tissue  Result Value Ref Range Status   Specimen Description TISSUE  Final   Special Requests RIGHT TIBIAL INFECTION 1  Final   Gram Stain NO WBC SEEN NO ORGANISMS SEEN   Final   Culture   Final    RARE ENTEROBACTER SPECIES IDENTIFICATION AND SUSCEPTIBILITIES TO FOLLOW Performed at [facility], [address], [place], [place] [address]    Report Status PENDING  Incomplete  Aerobic/Anaerobic Culture w Gram Stain (surgical/deep wound)     Status: None (Preliminary result)   Collection Time: 10/15/23  9:24 AM   Specimen: Leg, Right; Tissue  Result Value Ref Range Status   Specimen Description TISSUE  Final   Special Requests RIGHT TIBIAL INFECTION 2  Final   Gram Stain   Final    NO WBC SEEN RARE GRAM POSITIVE COCCI IN CLUSTERS    Culture   Final    FEW ENTEROBACTER SPECIES RARE KLEBSIELLA PNEUMONIAE CULTURE REINCUBATED FOR BETTER GROWTH Performed at [facility], [address] [place], [place] [address]    Report Status PENDING  Incomplete     Radiology Studies: VAS US  ABI WITH/WO TBI Result Date: 10/17/2023  LOWER EXTREMITY DOPPLER STUDY Patient Name:  Joe Coleman   Date of Exam:   10/16/2023 Medical Rec #: [id number]        Accession #:    [id number] Date of Birth: January 28, 1946        Patient Gender: M Patient Age:   54 years Exam Location:  [facility] Procedure:      VAS US  ABI WITH/WO TBI Referring Phys: EKTA PATEL --------------------------------------------------------------------------------  Indications:  Decreased pedal pulses. High Risk Factors: Hypertension, Diabetes, past history of smoking.  Limitations: Today's exam was limited due to an open wound, bandages and post-op              cast, unable to compress. Comparison Study: No prior exam. Performing Technologist: Edilia Elden Appl  Examination Guidelines: A complete evaluation includes at minimum, Doppler waveform signals and systolic blood pressure reading at the level of bilateral brachial, anterior tibial, and posterior tibial arteries, when vessel segments are accessible. Bilateral testing is considered an integral part of a complete examination. Photoelectric Plethysmograph (PPG) waveforms and toe systolic pressure readings are included as required and additional duplex testing as needed. Limited examinations for reoccurring indications may be performed as noted.  ABI Findings: +---------+------------------+-----+---------+-------------+ Right    Rt Pressure (mmHg)IndexWaveform Comment       +---------+------------------+-----+---------+-------------+ Brachial 129                    triphasic              +---------+------------------+-----+---------+-------------+ PTA                                      Not obtained. +---------+------------------+-----+---------+-------------+ DP                              triphasic              +---------+------------------+-----+---------+-------------+ Great Toe70                0.54 Normal                 +---------+------------------+-----+---------+-------------+ +---------+------------------+-----+---------+-------+ Left      Lt Pressure (mmHg)IndexWaveform Comment +---------+------------------+-----+---------+-------+ Brachial 127                    triphasic        +---------+------------------+-----+---------+-------+ PTA      144               1.12 triphasic        +---------+------------------+-----+---------+-------+ DP       133               1.03 triphasic        +---------+------------------+-----+---------+-------+ Great Toe101               0.78 Normal           +---------+------------------+-----+---------+-------+ Unable to obtain right lower extremity pressures due to patient's post-op cast. Waveform dopplers of the dorsalis pedis visualized.  Summary: Right: The right toe-brachial index is abnormal.  Unable to obtain pressures. Left: Resting left ankle-brachial index is within normal range. The left toe-brachial index is normal.  *See table(s) above for measurements and observations.  Electronically signed by Debby Robertson on 10/17/2023 at 7:17:31 AM.    Final    US  EKG SITE RITE Result Date: 10/17/2023 If Site Rite image not attached, placement could not be confirmed due to current cardiac rhythm.    Nilda Fendt, MD, PhD [facility]  Between 7 am - 7 pm I am available, please contact me via Amion (for emergencies) or Securechat (non urgent messages)  Between 7 pm - 7 am I am not available, please contact night coverage MD/APP via Amion

## 2023-10-17 NOTE — Plan of Care (Signed)
  Problem: Pain Managment: Goal: General experience of comfort will improve and/or be controlled Outcome: Progressing   Problem: Safety: Goal: Ability to remain free from injury will improve Outcome: Progressing

## 2023-10-17 NOTE — Progress Notes (Signed)
 Ortho Progress Note  No issues, therapy didn't work with him today. Agreeable to SNF but does not want to go to [facility]  PE: NAD, AAOx3 Wiggles toes, compartments soft and compressible, sensation intact. Wound vac with good seal and serosang output  Results for orders placed or performed during the hospital encounter of 10/14/23 (from the past 24 hours)  Glucose, capillary     Status: None   Collection Time: 10/16/23  5:18 PM  Result Value Ref Range   Glucose-Capillary 83 70 - 99 mg/dL  Glucose, capillary     Status: Abnormal   Collection Time: 10/16/23  7:52 PM  Result Value Ref Range   Glucose-Capillary 199 (H) 70 - 99 mg/dL  Glucose, capillary     Status: Abnormal   Collection Time: 10/17/23 12:32 AM  Result Value Ref Range   Glucose-Capillary 155 (H) 70 - 99 mg/dL  CBC     Status: Abnormal   Collection Time: 10/17/23  3:35 AM  Result Value Ref Range   WBC 7.3 4.0 - 10.5 K/uL   RBC 3.10 (L) 4.22 - 5.81 MIL/uL   Hemoglobin 8.4 (L) 13.0 - 17.0 g/dL   HCT 72.5 (L) 60.9 - 47.9 %   MCV 88.4 80.0 - 100.0 fL   MCH 27.1 26.0 - 34.0 pg   MCHC 30.7 30.0 - 36.0 g/dL   RDW 84.2 (H) 88.4 - 84.4 %   Platelets 204 150 - 400 K/uL   nRBC 0.0 0.0 - 0.2 %  Basic metabolic panel with GFR     Status: Abnormal   Collection Time: 10/17/23  3:35 AM  Result Value Ref Range   Sodium 136 135 - 145 mmol/L   Potassium 4.9 3.5 - 5.1 mmol/L   Chloride 104 98 - 111 mmol/L   CO2 25 22 - 32 mmol/L   Glucose, Bld 131 (H) 70 - 99 mg/dL   BUN 27 (H) 8 - 23 mg/dL   Creatinine, Ser 8.35 (H) 0.61 - 1.24 mg/dL   Calcium 8.3 (L) 8.9 - 10.3 mg/dL   GFR, Estimated 42 (L) >60 mL/min   Anion gap 7 5 - 15  Magnesium      Status: None   Collection Time: 10/17/23  3:35 AM  Result Value Ref Range   Magnesium  2.0 1.7 - 2.4 mg/dL  Glucose, capillary     Status: Abnormal   Collection Time: 10/17/23  4:09 AM  Result Value Ref Range   Glucose-Capillary 129 (H) 70 - 99 mg/dL  Glucose, capillary     Status: Abnormal    Collection Time: 10/17/23  8:17 AM  Result Value Ref Range   Glucose-Capillary 142 (H) 70 - 99 mg/dL   Comment 1 Notify RN   Glucose, capillary     Status: Abnormal   Collection Time: 10/17/23 12:10 PM  Result Value Ref Range   Glucose-Capillary 154 (H) 70 - 99 mg/dL   Comment 1 Notify RN      Cultures with 2/2 enterobacter and 1/2 with klebsiella, awaiting speciation  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  Franky MYRTIS Light, MD [facility] [phone] (office) [email or web address]

## 2023-10-17 NOTE — Care Management Important Message (Signed)
 Important Message  Patient Details  Name: Joe Coleman MRN: [id number] Date of Birth: 06-25-1945   Important Message Given:  Yes - Medicare IM     Jon Cruel 10/17/2023, 1:15 PM

## 2023-10-17 NOTE — NC FL2 (Signed)
 [place]  MEDICAID FL2 LEVEL OF CARE FORM     IDENTIFICATION  Patient Name: Joe Coleman Birthdate: [birth year] Sex: male Admission Date (Current Location): 10/14/2023  [facility] and [place] Number:  Chiropodist and Address:  The . [facility], [address], [place], [place] [address]      Provider Number: [id number]  Attending Physician Name and Address:  Trixie Nilda HERO, MD  Relative Name and Phone Number:  Diron, Haddon)  [phone]    Current Level of Care: Hospital Recommended Level of Care: Skilled Nursing Facility Prior Approval Number:    Date Approved/Denied: 10/17/23 PASRR Number: [id number]  Discharge Plan: SNF    Current Diagnoses: Patient Active Problem List   Diagnosis Date Noted   Open knee wound, right, sequela 10/14/2023   Malnutrition of moderate degree 08/30/2023   Tibia/fibula fracture 08/24/2023   Hypotension 08/24/2023   Hyperkalemia 08/24/2023   Cellulitis of right arm 08/10/2023   Eye pain, bilateral 07/26/2022   Fall 07/26/2022   Hyponatremia 07/26/2022   Lymphopenia 07/26/2022   Shortness of breath 05/21/2022   AKI (acute kidney injury) (HCC) 03/17/2020   COVID-19 virus infection 03/17/2020   COPD with acute exacerbation (HCC) 03/17/2020   Type 2 diabetes mellitus (HCC) 03/17/2020   HTN (hypertension) 03/17/2020   B12 deficiency 10/05/2019   Erectile dysfunction 11/10/2017   Chronic kidney disease (CKD), stage III (moderate) (HCC) 09/19/2017   Mild intermittent asthma without complication 09/19/2017   Chronic cough 10/22/2016   Epigastric pain 06/30/2015   Chronic bilateral low back pain without sciatica 05/31/2015   Neuropathic pain 03/01/2015   Benign prostatic hyperplasia with lower urinary tract symptoms 04/06/2014   Adenomatous colon polyp 08/31/2013   Depression 06/21/2013   Foot pain, bilateral 05/07/2013   Insomnia 03/24/2013   Vitiligo 12/23/2012   Primary localized osteoarthrosis,  lower leg 01/15/2011   Pain medication agreement broken 09/19/2010   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    Orientation RESPIRATION BLADDER Height & Weight     Self, Time, Situation, Place  O2 (room air 2L/min) Continent Weight: 145 lb (65.8 kg) Height:  5' 8 (172.7 cm)  BEHAVIORAL SYMPTOMS/MOOD NEUROLOGICAL BOWEL NUTRITION STATUS      Continent Diet  AMBULATORY STATUS COMMUNICATION OF NEEDS Skin   Limited Assist Verbally PU Stage and Appropriate Care (negative pressure wound right lower leg, trauma on right knee)                       Personal Care Assistance Level of Assistance  Bathing, Dressing, Feeding Bathing Assistance: Limited assistance Feeding assistance: Limited assistance Dressing Assistance: Limited assistance     Functional Limitations Info  Sight, Hearing, Speech Sight Info: Adequate Hearing Info: Adequate Speech Info: Adequate    SPECIAL CARE FACTORS FREQUENCY  OT (By licensed OT), PT (By licensed PT)     PT Frequency: 5x/week OT Frequency: 5x/week            Contractures Contractures Info: Not present    Additional Factors Info  Code Status, Allergies Code Status Info: full Allergies Info: Losartan, chicken allergy, poultry allergy           Current Medications (10/17/2023):  This is the current hospital active medication list Current Facility-Administered Medications  Medication Dose Route Frequency Provider Last Rate Last Admin   acetaminophen  (TYLENOL ) tablet 650 mg  650 mg Oral  Q6H Danton Lauraine LABOR, PA-C   650 mg at 10/17/23 1214   Or   acetaminophen  (TYLENOL ) suppository 650 mg  650 mg Rectal Q6H McClung, Sarah A, PA-C       albuterol  (PROVENTIL ) (2.5 MG/3ML) 0.083% nebulizer solution 3 mL  3 mL Inhalation Q6H PRN Tobie Mario GAILS, MD       aspirin  tablet 325 mg  325 mg Oral Daily Danton Lauraine LABOR, PA-C   325 mg at  10/17/23 0913   bisacodyl  (DULCOLAX) EC tablet 5 mg  5 mg Oral Daily PRN Patel, Ekta V, MD       budesonide -glycopyrrolate -formoterol  (BREZTRI ) 160-9-4.8 MCG/ACT inhaler 2 puff  2 puff Inhalation BID Tobie Mario GAILS, MD   2 puff at 10/16/23 2003   ceFEPIme  (MAXIPIME ) 2 g in sodium chloride  0.9 % 100 mL IVPB  2 g Intravenous Q12H Dodson, Andrew J, RPH 200 mL/hr at 10/17/23 0524 2 g at 10/17/23 0524   Chlorhexidine  Gluconate Cloth 2 % PADS 6 each  6 each Topical Daily Gherghe, Costin M, MD   6 each at 10/17/23 1214   DAPTOmycin  (CUBICIN ) IVPB 500 mg/44mL premix  8 mg/kg Intravenous Q1400 Dennise Kingsley, MD       diphenhydrAMINE  (BENADRYL ) 12.5 MG/5ML elixir 12.5-25 mg  12.5-25 mg Oral Q4H PRN Danton Lauraine LABOR, PA-C   25 mg at 10/17/23 9972   docusate sodium  (COLACE) capsule 100 mg  100 mg Oral BID Danton Lauraine LABOR, PA-C   100 mg at 10/17/23 0913   gabapentin  (NEURONTIN ) capsule 300 mg  300 mg Oral QHS Patel, Ekta V, MD   300 mg at 10/16/23 2052   guaiFENesin -dextromethorphan  (ROBITUSSIN DM) 100-10 MG/5ML syrup 5 mL  5 mL Oral Q4H PRN Gherghe, Costin M, MD   5 mL at 10/17/23 0411   hydrALAZINE  (APRESOLINE ) injection 5 mg  5 mg Intravenous Q4H PRN Patel, Ekta V, MD       insulin  aspart (novoLOG ) injection 0-15 Units  0-15 Units Subcutaneous Q4H PRN Patel, Ekta V, MD       methocarbamol  (ROBAXIN ) tablet 500 mg  500 mg Oral Q6H PRN Danton, Sarah A, PA-C       Or   methocarbamol  (ROBAXIN ) injection 500 mg  500 mg Intravenous Q6H PRN Danton Lauraine LABOR, PA-C       metoCLOPramide  (REGLAN ) tablet 5-10 mg  5-10 mg Oral Q8H PRN Danton, Sarah A, PA-C       Or   metoCLOPramide  (REGLAN ) injection 5-10 mg  5-10 mg Intravenous Q8H PRN Danton Lauraine LABOR, PA-C       morphine  (PF) 2 MG/ML injection 1-2 mg  1-2 mg Intravenous Q2H PRN McClung, Sarah A, PA-C       nicotine  (NICODERM CQ  - dosed in mg/24 hours) patch 14 mg  14 mg Transdermal Daily PRN Patel, Ekta V, MD       ondansetron  (ZOFRAN ) tablet 4 mg  4 mg Oral Q6H  PRN Patel, Ekta V, MD       Or   ondansetron  (ZOFRAN ) injection 4 mg  4 mg Intravenous Q6H PRN Patel, Ekta V, MD       oxyCODONE  (Oxy IR/ROXICODONE ) immediate release tablet 5-10 mg  5-10 mg Oral Q4H PRN Gherghe, Costin M, MD   10 mg at 10/17/23 0920   pantoprazole  (PROTONIX ) EC tablet 40 mg  40 mg Oral Daily Patel, Ekta V, MD   40 mg at 10/17/23 0913   polyethylene glycol (MIRALAX  / GLYCOLAX ) packet 17 g  17 g Oral Daily PRN Patel, Ekta V, MD       sodium chloride  flush (NS) 0.9 % injection 10-40 mL  10-40 mL Intracatheter PRN Gherghe, Costin M, MD         Discharge Medications: Please see discharge summary for a list of discharge medications.  Relevant Imaging Results:  Relevant Lab Results:   Additional Information SSN [id number]  Jeoffrey LITTIE Moose, LCSWA

## 2023-10-17 NOTE — TOC Initial Note (Signed)
 Transition of Care [facility]) - Initial/Assessment Note    Patient Details  Name: Joe Coleman MRN: [id number] Date of Birth: 1946/02/02  Transition [facility]) CM/SW Contact:    Jeoffrey LITTIE Moose, [place] Phone Number: 10/17/2023, 1:45 PM  Clinical Narrative:                 Pt admitted from home due to surgical wound dehiscence. CSW completed SNF workup with pt, he is agreeable to DC plan. CSW sent out SNF referrals and completed Fl2. CSW will continue to follow and provide bed offers.        Patient Goals and CMS Choice            Expected Discharge Plan and Services                                              Prior Living Arrangements/Services                       Activities of Daily Living   ADL Screening (condition at time of admission) Independently performs ADLs?: No Does the patient have a NEW difficulty with bathing/dressing/toileting/self-feeding that is expected to last >3 days?: No Does the patient have a NEW difficulty with getting in/out of bed, walking, or climbing stairs that is expected to last >3 days?: No Does the patient have a NEW difficulty with communication that is expected to last >3 days?: No Is the patient deaf or have difficulty hearing?: No Does the patient have difficulty seeing, even when wearing glasses/contacts?: No Does the patient have difficulty concentrating, remembering, or making decisions?: No  Permission Sought/Granted                  Emotional Assessment              Admission diagnosis:  Wound dehiscence [T81.30XA] Open knee wound, right, sequela [S81.001S] Patient Active Problem List   Diagnosis Date Noted   Open knee wound, right, sequela 10/14/2023   Malnutrition of moderate degree 08/30/2023   Tibia/fibula fracture 08/24/2023   Hypotension 08/24/2023   Hyperkalemia 08/24/2023   Cellulitis of right arm 08/10/2023   Eye pain, bilateral 07/26/2022   Fall 07/26/2022   Hyponatremia  07/26/2022   Lymphopenia 07/26/2022   Shortness of breath 05/21/2022   AKI (acute kidney injury) (HCC) 03/17/2020   COVID-19 virus infection 03/17/2020   COPD with acute exacerbation (HCC) 03/17/2020   Type 2 diabetes mellitus (HCC) 03/17/2020   HTN (hypertension) 03/17/2020   B12 deficiency 10/05/2019   Erectile dysfunction 11/10/2017   Chronic kidney disease (CKD), stage III (moderate) (HCC) 09/19/2017   Mild intermittent asthma without complication 09/19/2017   Chronic cough 10/22/2016   Epigastric pain 06/30/2015   Chronic bilateral low back pain without sciatica 05/31/2015   Neuropathic pain 03/01/2015   Benign prostatic hyperplasia with lower urinary tract symptoms 04/06/2014   Adenomatous colon polyp 08/31/2013   Depression 06/21/2013   Foot pain, bilateral 05/07/2013   Insomnia 03/24/2013   Vitiligo 12/23/2012   Primary localized osteoarthrosis, lower leg 01/15/2011   Pain medication agreement broken 09/19/2010   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 [place] [address] Phone: [phone] Fax: [phone]     Social Drivers of Health (SDOH) Social History: SDOH Screenings   Food Insecurity: No Food Insecurity (10/14/2023)  Housing: Unknown (10/14/2023)  Transportation Needs: No Transportation Needs (10/14/2023)  Utilities: Not At Risk (10/14/2023)  Financial Resource Strain: Medium Risk (05/02/2021)   Received from [facility]  Physical Activity: Inactive (08/25/2018)   Received from [facility]  Social Connections: Unknown (10/14/2023)  Stress: No Stress Concern Present (08/25/2018)   Received from [facility]  Tobacco Use: Medium Risk (10/15/2023)  Health Literacy: High Risk (05/04/2022)   Received from [facility]   SDOH  Interventions:     Readmission Risk Interventions    09/01/2023   12:00 PM  Readmission Risk Prevention Plan  Transportation Screening Complete  Medication Review (RN Care Manager) Complete  PCP or Specialist appointment within 3-5 days of discharge Complete  HRI or Home Care Consult Complete  SW Recovery Care/Counseling Consult Complete  Palliative Care Screening Not Applicable  Skilled Nursing Facility Complete

## 2023-10-18 DIAGNOSIS — B9629 Other Escherichia coli [E. coli] as the cause of diseases classified elsewhere: Secondary | ICD-10-CM

## 2023-10-18 DIAGNOSIS — Z87891 Personal history of nicotine dependence: Secondary | ICD-10-CM

## 2023-10-18 DIAGNOSIS — B961 Klebsiella pneumoniae [K. pneumoniae] as the cause of diseases classified elsewhere: Secondary | ICD-10-CM

## 2023-10-18 DIAGNOSIS — S81001S Unspecified open wound, right knee, sequela: Secondary | ICD-10-CM

## 2023-10-18 DIAGNOSIS — E119 Type 2 diabetes mellitus without complications: Secondary | ICD-10-CM

## 2023-10-18 DIAGNOSIS — R053 Chronic cough: Secondary | ICD-10-CM

## 2023-10-18 DIAGNOSIS — I739 Peripheral vascular disease, unspecified: Secondary | ICD-10-CM

## 2023-10-18 DIAGNOSIS — Z9981 Dependence on supplemental oxygen: Secondary | ICD-10-CM

## 2023-10-18 LAB — CBC
HCT: 30.4 % — ABNORMAL LOW (ref 39.0–52.0)
Hemoglobin: 9.3 g/dL — ABNORMAL LOW (ref 13.0–17.0)
MCH: 27.4 pg (ref 26.0–34.0)
MCHC: 30.6 g/dL (ref 30.0–36.0)
MCV: 89.7 fL (ref 80.0–100.0)
Platelets: 214 K/uL (ref 150–400)
RBC: 3.39 MIL/uL — ABNORMAL LOW (ref 4.22–5.81)
RDW: 15.7 % — ABNORMAL HIGH (ref 11.5–15.5)
WBC: 7.8 K/uL (ref 4.0–10.5)
nRBC: 0 % (ref 0.0–0.2)

## 2023-10-18 LAB — GLUCOSE, CAPILLARY
Glucose-Capillary: 103 mg/dL — ABNORMAL HIGH (ref 70–99)
Glucose-Capillary: 116 mg/dL — ABNORMAL HIGH (ref 70–99)
Glucose-Capillary: 129 mg/dL — ABNORMAL HIGH (ref 70–99)
Glucose-Capillary: 141 mg/dL — ABNORMAL HIGH (ref 70–99)
Glucose-Capillary: 145 mg/dL — ABNORMAL HIGH (ref 70–99)
Glucose-Capillary: 149 mg/dL — ABNORMAL HIGH (ref 70–99)
Glucose-Capillary: 179 mg/dL — ABNORMAL HIGH (ref 70–99)

## 2023-10-18 LAB — BASIC METABOLIC PANEL WITH GFR
Anion gap: 7 (ref 5–15)
BUN: 27 mg/dL — ABNORMAL HIGH (ref 8–23)
CO2: 22 mmol/L (ref 22–32)
Calcium: 8.5 mg/dL — ABNORMAL LOW (ref 8.9–10.3)
Chloride: 106 mmol/L (ref 98–111)
Creatinine, Ser: 1.51 mg/dL — ABNORMAL HIGH (ref 0.61–1.24)
GFR, Estimated: 46 mL/min — ABNORMAL LOW (ref >60)
Glucose, Bld: 124 mg/dL — ABNORMAL HIGH (ref 70–99)
Potassium: 4.8 mmol/L (ref 3.5–5.1)
Sodium: 135 mmol/L (ref 135–145)

## 2023-10-18 LAB — MAGNESIUM: Magnesium: 1.9 mg/dL (ref 1.7–2.4)

## 2023-10-18 LAB — CK: Total CK: 22 U/L — ABNORMAL LOW (ref 49–397)

## 2023-10-18 MED ORDER — INSULIN ASPART 100 UNIT/ML IJ SOLN: 0.0000 [IU] | Freq: Every day | INTRAMUSCULAR | Status: DC

## 2023-10-18 MED ORDER — INSULIN ASPART 100 UNIT/ML IJ SOLN: 0.0000 [IU] | Freq: Three times a day (TID) | INTRAMUSCULAR | Status: DC

## 2023-10-18 MED ORDER — INSULIN ASPART 100 UNIT/ML IJ SOLN
0.0000 [IU] | Freq: Three times a day (TID) | INTRAMUSCULAR | Status: DC
  Administered 2023-10-19 – 2023-10-20 (×3): 3 [IU] via SUBCUTANEOUS

## 2023-10-18 NOTE — Progress Notes (Signed)
 PROGRESS NOTE  Joe BOCCIO FMW:[id number] DOB: 1945-09-19 DOA: 10/14/2023 PCP: [facility]   LOS: 4 days   Brief Narrative / Interim history: 78 year old male with DM2, HTN comes into the hospital with wound dehiscence.  He was admitted for leg injury on July 19 with right tibial fracture, underwent immobilization with a long-leg splint with ORIF 7/21, discharged 7/28.  He was seen in the ED mid August with concern for wound infection and started on doxycycline .  Did not really improve and came back to the hospital.  Orthopedic surgery consulted and he was taken to the OR on 9/10for remmooval of hardware.  Subjective / 24h Interval events: The patient has had a PICCline placed and OPAT orders are in place. He will need to have home health set up for him to get his long term IV antibiotics. ID recommends 6 weeks of IV antibiotics. Cultures have grown out enterobacter Cloquel and Klebsiella pneumoniae. He will receive 6 weeks of IV daptomycin  and cefepime .  Assesement and Plan: Principal problem Right leg wound/hardware infection -patient was admitted to the hospital and placed on IV antibiotics, continue.  Orthopedic surgery consulted, he was taken to the OR on 9/10 status post hardware removal, I&D right proximal tibia, skin graft, long-leg splint and wound VAC placement - Continue to monitor postoperatively, intraoperative cultures have grown out enterobacter Cloquel and Klebsiella pneumoniae.. - Pt is receiving IV cefepime  and klebsiella pneumoniae. - Blood cultures have had no growth to date.  Active problems History of tobacco use -no documentation of COPD but he is on inhalers and home O2, continue.  Has increasing cough this morning, he is already on antibiotics as above, obtain a chest x-ray.  He tells me that his cough comes and goes.  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  Hold metformin   Lab Results  Component Value Date   HGBA1C 7.6 (H) 08/10/2023    Scheduled Meds:  acetaminophen   650 mg Oral Q6H   Or   acetaminophen   650 mg Rectal Q6H   aspirin   325 mg Oral Daily   budesonide -glycopyrrolate -formoterol   2 puff Inhalation BID   Chlorhexidine  Gluconate Cloth  6 each Topical Daily   docusate sodium   100 mg Oral BID   gabapentin   300 mg Oral QHS   pantoprazole   40 mg Oral Daily   Continuous Infusions:  ceFEPime  (MAXIPIME ) IV 2 g (10/18/23 1648)   DAPTOmycin  500 mg (10/18/23 1424)   PRN Meds:.albuterol , bisacodyl , diphenhydrAMINE , guaiFENesin -dextromethorphan , hydrALAZINE , insulin  aspart, methocarbamol  **OR** methocarbamol  (ROBAXIN ) injection, metoCLOPramide  **OR** metoCLOPramide  (REGLAN ) injection, morphine  injection, nicotine , ondansetron  **OR** ondansetron  (ZOFRAN ) IV, oxyCODONE , polyethylene glycol, sodium chloride  flush  Current Outpatient Medications  Medication Instructions   albuterol  (PROVENTIL  HFA;VENTOLIN  HFA) 108 (90 Base) MCG/ACT inhaler 2 puffs, Inhalation, Every 6 hours PRN   doxycycline  (VIBRA -TABS) 100 mg, Oral, 2 times daily   feeding supplement, GLUCERNA SHAKE, (GLUCERNA SHAKE) LIQD 237 mLs, Oral, 3 times daily between meals   gabapentin  (NEURONTIN ) 300 mg, Daily at bedtime   HYDROcodone-acetaminophen  (NORCO/VICODIN) 5-325 MG tablet 1 tablet, Oral, Every 8 hours PRN   ipratropium-albuterol  (DUONEB) 0.5-2.5 (3) MG/3ML SOLN 3 mLs,  Nebulization, Every 4 hours PRN   metFORMIN  (GLUCOPHAGE ) 500 mg, 2 times daily with meals   methocarbamol  (ROBAXIN ) 500 mg, Oral, Every 6 hours PRN   oxyCODONE  (ROXICODONE ) 2.5-5 mg, Oral, Every 4  hours PRN   pantoprazole  (PROTONIX ) 40 mg, Daily   polyethylene glycol (MIRALAX  / GLYCOLAX ) 17 g, Oral, Daily   senna-docusate (SENOKOT-S) 8.6-50 MG tablet 1 tablet, Oral, Daily at bedtime   TRELEGY ELLIPTA 200-62.5-25 MCG/ACT AEPB 1 puff, Daily   vitamin D3 (CHOLECALCIFEROL ) 1,000 Units, Oral, Daily    Diet Orders (From admission, onward)     Start     Ordered   10/15/23 1106  Diet Carb Modified Fluid consistency: Thin; Room service appropriate? Yes  Diet effective now       Question Answer Comment  Calorie Level Medium 1600-2000   Fluid consistency: Thin   Room service appropriate? Yes      10/15/23 1106            DVT prophylaxis: SCDs Start: 10/15/23 1137   Lab Results  Component Value Date   PLT 214 10/18/2023      Code Status: Full Code  Family Communication: no family at bedside   Status is: Inpatient Remains inpatient appropriate because: severity of illness  Level of care: Telemetry Medical  Consultants:  Orthopedic surgery  Objective: Vitals:   10/17/23 2108 10/18/23 0411 10/18/23 0758 10/18/23 1556  BP: 128/73 (!) 142/77 127/79 (!) 126/90  Pulse: 93 85 88 72  Resp: 18 18 18 18   Temp: 98.1 F (36.7 C) 98.3 F (36.8 C) 98.2 F (36.8 C) 98.2 F (36.8 C)  TempSrc: Oral Oral Oral Oral  SpO2: 93% 95% 95% 99%  Weight:      Height:        Intake/Output Summary (Last 24 hours) at 10/18/2023 1749 Last data filed at 10/18/2023 1600 Gross per 24 hour  Intake 862.28 ml  Output 1075 ml  Net -212.72 ml   Wt Readings from Last 3 Encounters:  10/14/23 65.8 kg  09/24/23 68 kg  08/23/23 68 kg    Examination:  Exam:  Constitutional:  The patient is awake, alert, and oriented x 3. No acute distress. Eyes:  pupils and irises appear normal Normal lids and conjunctivae ENMT:  grossly normal hearing  Lips appear normal external ears, nose appear normal Oropharynx: mucosa, tongue,posterior pharynx appear normal Neck:  neck appears normal, no  masses, normal ROM, supple no thyromegaly Respiratory:  No increased work of breathing. No wheezes, rales, or rhonchi No tactile fremitus Cardiovascular:  Regular rate and rhythm No murmurs, ectopy, or gallups. No lateral PMI. No thrills. Abdomen:  Abdomen is soft, non-tender, non-distended No hernias, masses, or organomegaly Normoactive bowel sounds.  Musculoskeletal:  No cyanosis, clubbing, or edema Right lowere extremity is bandaged. Skin:  No rashes, lesions, ulcers palpation of skin: no induration or nodules Neurologic:  CN 2-12 intact Sensation all 4 extremities intact Psychiatric:  Mental status Mood, affect appropriate Orientation to person, place, time  judgment and insight appear intact    Data Reviewed: I have independently reviewed following labs and imaging studies   CBC Recent Labs  Lab 10/14/23 1445 10/16/23 0328 10/17/23 0335 10/18/23 0232  WBC 8.7 7.4 7.3 7.8  HGB 11.3* 9.2* 8.4* 9.3*  HCT 39.1 29.4* 27.4* 30.4*  PLT 247 207 204 214  MCV 94.7 87.8 88.4 89.7  MCH 27.4 27.5 27.1 27.4  MCHC 28.9* 31.3 30.7 30.6  RDW 15.8* 15.2 15.7* 15.7*  LYMPHSABS 1.1  --   --   --  MONOABS 0.8  --   --   --   EOSABS 0.4  --   --   --   BASOSABS 0.1  --   --   --     Recent Labs  Lab 10/14/23 1445 10/16/23 0328 10/17/23 0335 10/18/23 0232  NA 136 136 136 135  K 4.9 5.2* 4.9 4.8  CL 106 103 104 106  CO2 20* 24 25 22   GLUCOSE 111* 141* 131* 124*  BUN 24* 20 27* 27*  CREATININE 1.62* 1.39* 1.64* 1.51*  CALCIUM 8.8* 8.6* 8.3* 8.5*  AST 25 14*  --   --   ALT 9 6  --   --   ALKPHOS 104 67  --   --   BILITOT 0.5 0.6  --   --   ALBUMIN  2.9* 2.1*  --   --   MG  --  1.5* 2.0 1.9    ------------------------------------------------------------------------------------------------------------------ No results for input(s): CHOL, HDL, LDLCALC, TRIG, CHOLHDL, LDLDIRECT in the last 72 hours.  Lab Results  Component Value Date   HGBA1C 7.6  (H) 08/10/2023   ------------------------------------------------------------------------------------------------------------------ No results for input(s): TSH, T4TOTAL, T3FREE, THYROIDAB in the last 72 hours.  Invalid input(s): FREET3  Cardiac Enzymes No results for input(s): CKMB, TROPONINI, MYOGLOBIN in the last 168 hours.  Invalid input(s): CK ------------------------------------------------------------------------------------------------------------------    Component Value Date/Time   BNP 69.0 04/17/2016 0422    CBG: Recent Labs  Lab 10/18/23 0038 10/18/23 0407 10/18/23 0756 10/18/23 1203 10/18/23 1706  GLUCAP 149* 103* 116* 129* 179*    Recent Results (from the past 240 hours)  Blood culture (routine x 2)     Status: None (Preliminary result)   Collection Time: 10/14/23  2:45 PM   Specimen: BLOOD  Result Value Ref Range Status   Specimen Description BLOOD SITE NOT SPECIFIED  Final   Special Requests   Final    BOTTLES DRAWN AEROBIC AND ANAEROBIC Blood Culture results may not be optimal due to an inadequate volume of blood received in culture bottles   Culture   Final    NO GROWTH 4 DAYS Performed at [facility], [address] [place], [place] [address]    Report Status PENDING  Incomplete  Blood culture (routine x 2)     Status: None (Preliminary result)   Collection Time: 10/14/23  2:45 PM   Specimen: BLOOD  Result Value Ref Range Status   Specimen Description BLOOD SITE NOT SPECIFIED  Final   Special Requests   Final    BOTTLES DRAWN AEROBIC AND ANAEROBIC Blood Culture results may not be optimal due to an inadequate volume of blood received in culture bottles   Culture   Final    NO GROWTH 4 DAYS Performed at [facility], [address] [place], [place] [address]    Report Status PENDING  Incomplete  MRSA Next Gen by PCR, Nasal     Status: None   Collection Time: 10/15/23  6:02 AM   Specimen: Nasal Mucosa; Nasal Swab   Result Value Ref Range Status   MRSA by PCR Next Gen NOT DETECTED NOT DETECTED Final    Comment: (NOTE) The GeneXpert MRSA Assay (FDA approved for NASAL specimens only), is one component of a comprehensive MRSA colonization surveillance program. It is not intended to diagnose MRSA infection nor to guide or monitor treatment for MRSA infections. Test performance is not FDA approved in patients less than 45 years old. Performed at [facility], [address] [place], [place] [address]   Aerobic/Anaerobic Culture w Gram Stain (surgical/deep wound)     Status: None (Preliminary result)   Collection Time: 10/15/23  9:22 AM   Specimen: Leg, Right; Tissue  Result Value Ref Range Status   Specimen Description TISSUE  Final   Special Requests RIGHT TIBIAL INFECTION 1  Final   Gram Stain   Final    NO WBC SEEN NO ORGANISMS SEEN Performed at [facility], [address], [place], [place] [address]    Culture   Final    RARE ENTEROBACTER CLOACAE CULTURE REINCUBATED FOR BETTER GROWTH NO ANAEROBES ISOLATED; CULTURE IN PROGRESS FOR 5 DAYS    Report Status PENDING  Incomplete   Organism ID, Bacteria ENTEROBACTER CLOACAE  Final      Susceptibility   Enterobacter cloacae - MIC*    CEFEPIME  0.25 SENSITIVE Sensitive     ERTAPENEM <=0.12 SENSITIVE Sensitive     CIPROFLOXACIN 0.25 SENSITIVE Sensitive     GENTAMICIN <=1 SENSITIVE Sensitive     MEROPENEM <=0.25 SENSITIVE Sensitive     TRIMETH/SULFA <=20 SENSITIVE Sensitive     PIP/TAZO Value in next row Sensitive ug/mL     8 SENSITIVEThis is a modified FDA-approved test that has been validated and its performance characteristics determined by the reporting laboratory.  This laboratory is certified under the Clinical Laboratory Improvement Amendments CLIA as qualified to perform high complexity clinical laboratory testing.    * RARE ENTEROBACTER CLOACAE  Aerobic/Anaerobic Culture w Gram Stain (surgical/deep wound)     Status: None  (Preliminary result)   Collection Time: 10/15/23  9:24 AM   Specimen: Leg, Right; Tissue  Result Value Ref Range Status   Specimen Description TISSUE  Final   Special Requests RIGHT TIBIAL INFECTION 2  Final   Gram Stain   Final    NO WBC SEEN RARE GRAM POSITIVE COCCI IN CLUSTERS Performed at [facility], [address] [place], [place] [address]    Culture   Final    FEW ENTEROBACTER CLOACAE RARE KLEBSIELLA PNEUMONIAE NO ANAEROBES ISOLATED; CULTURE IN PROGRESS FOR 5 DAYS    Report Status PENDING  Incomplete   Organism ID, Bacteria ENTEROBACTER CLOACAE  Final   Organism ID, Bacteria KLEBSIELLA PNEUMONIAE  Final      Susceptibility   Enterobacter cloacae - MIC*    CEFEPIME  <=0.12 SENSITIVE Sensitive     ERTAPENEM <=0.12 SENSITIVE Sensitive     CIPROFLOXACIN <=0.06 SENSITIVE Sensitive     GENTAMICIN <=1 SENSITIVE Sensitive     MEROPENEM <=0.25 SENSITIVE Sensitive     TRIMETH/SULFA <=20 SENSITIVE Sensitive     PIP/TAZO Value in next row Sensitive ug/mL     <=4 SENSITIVEThis is a modified FDA-approved test that has been validated and its performance characteristics determined by the reporting laboratory.  This laboratory is certified under the Clinical Laboratory Improvement Amendments CLIA as qualified to perform high complexity clinical laboratory testing.    * FEW ENTEROBACTER CLOACAE   Klebsiella pneumoniae - MIC*    AMPICILLIN Value in next row Resistant      <=4 SENSITIVEThis is a modified FDA-approved test that has been validated and its performance characteristics determined by the reporting laboratory.  This laboratory is certified under the Clinical Laboratory Improvement Amendments CLIA as qualified to perform high complexity clinical laboratory testing.    CEFAZOLIN  (NON-URINE) Value in next row Sensitive      <=4 SENSITIVEThis is a modified FDA-approved test  that has been validated and its performance characteristics determined by the reporting laboratory.  This  laboratory is certified under the Clinical Laboratory Improvement Amendments CLIA as qualified to perform high complexity clinical laboratory testing.    CEFEPIME  Value in next row Sensitive      <=4 SENSITIVEThis is a modified FDA-approved test that has been validated and its performance characteristics determined by the reporting laboratory.  This laboratory is certified under the Clinical Laboratory Improvement Amendments CLIA as qualified to perform high complexity clinical laboratory testing.    ERTAPENEM Value in next row Sensitive      <=4 SENSITIVEThis is a modified FDA-approved test that has been validated and its performance characteristics determined by the reporting laboratory.  This laboratory is certified under the Clinical Laboratory Improvement Amendments CLIA as qualified to perform high complexity clinical laboratory testing.    CEFTRIAXONE Value in next row Sensitive      <=4 SENSITIVEThis is a modified FDA-approved test that has been validated and its performance characteristics determined by the reporting laboratory.  This laboratory is certified under the Clinical Laboratory Improvement Amendments CLIA as qualified to perform high complexity clinical laboratory testing.    CIPROFLOXACIN Value in next row Sensitive      <=4 SENSITIVEThis is a modified FDA-approved test that has been validated and its performance characteristics determined by the reporting laboratory.  This laboratory is certified under the Clinical Laboratory Improvement Amendments CLIA as qualified to perform high complexity clinical laboratory testing.    GENTAMICIN Value in next row Sensitive      <=4 SENSITIVEThis is a modified FDA-approved test that has been validated and its performance characteristics determined by the reporting laboratory.  This laboratory is certified under the Clinical Laboratory Improvement Amendments CLIA as qualified to perform high complexity clinical laboratory testing.    MEROPENEM  Value in next row Sensitive      <=4 SENSITIVEThis is a modified FDA-approved test that has been validated and its performance characteristics determined by the reporting laboratory.  This laboratory is certified under the Clinical Laboratory Improvement Amendments CLIA as qualified to perform high complexity clinical laboratory testing.    TRIMETH/SULFA Value in next row Sensitive      <=4 SENSITIVEThis is a modified FDA-approved test that has been validated and its performance characteristics determined by the reporting laboratory.  This laboratory is certified under the Clinical Laboratory Improvement Amendments CLIA as qualified to perform high complexity clinical laboratory testing.    AMPICILLIN/SULBACTAM Value in next row Sensitive      <=4 SENSITIVEThis is a modified FDA-approved test that has been validated and its performance characteristics determined by the reporting laboratory.  This laboratory is certified under the Clinical Laboratory Improvement Amendments CLIA as qualified to perform high complexity clinical laboratory testing.    PIP/TAZO Value in next row Sensitive ug/mL     <=4 SENSITIVEThis is a modified FDA-approved test that has been validated and its performance characteristics determined by the reporting laboratory.  This laboratory is certified under the Clinical Laboratory Improvement Amendments CLIA as qualified to perform high complexity clinical laboratory testing.    * RARE KLEBSIELLA PNEUMONIAE     Radiology Studies: No results found.   Reneta Niehaus, DO [facility] 10/18/2023, 6:15 PM  Between 7 am - 7 pm I am available, please contact me via Amion (for emergencies) or Securechat (non urgent messages)  Between 7 pm - 7 am I am not available, please contact night coverage MD/APP via Amion

## 2023-10-18 NOTE — Plan of Care (Signed)
  Problem: Education: Goal: Ability to describe self-care measures that may prevent or decrease complications (Diabetes Survival Skills Education) will improve Outcome: Progressing   Problem: Coping: Goal: Ability to adjust to condition or change in health will improve Outcome: Progressing   Problem: Metabolic: Goal: Ability to maintain appropriate glucose levels will improve Outcome: Progressing   Problem: Nutritional: Goal: Maintenance of adequate nutrition will improve Outcome: Progressing   Problem: Skin Integrity: Goal: Risk for impaired skin integrity will decrease Outcome: Progressing   Problem: Health Behavior/Discharge Planning: Goal: Ability to manage health-related needs will improve Outcome: Progressing   Problem: Clinical Measurements: Goal: Will remain free from infection Outcome: Progressing Goal: Diagnostic test results will improve Outcome: Progressing

## 2023-10-18 NOTE — TOC Progression Note (Signed)
 Transition of Care [facility]) - Progression Note    Patient Details  Name: Joe Coleman MRN: [id number] Date of Birth: [birth year]  Transition of Care [facility]) CM/SW Contact  Karin Griffith, [place], [place] Phone Number: 10/18/2023, 12:16 PM  Clinical Narrative:     CSW met with patient at bedside to review bed offers. Bed offers reviewed, patient declined current bed offers. Patient's first choice would be [facility] and [facility]. Clinical information previously sent to [facility] and review  is pending. Phone call to [facility] confirming that the admissions coordinator will not be in until Monday to review.   Tinsley Everman, LCSW Transition of Care     Expected Discharge Plan: Skilled Nursing Facility Barriers to Discharge: English as a second language teacher, Continued Medical Work up, SNF Pending bed offer               Expected Discharge Plan and Services       Living arrangements for the past 2 months: Apartment                                       Social Drivers of Health (SDOH) Interventions SDOH Screenings   Food Insecurity: No Food Insecurity (10/14/2023)  Housing: Unknown (10/14/2023)  Transportation Needs: No Transportation Needs (10/14/2023)  Utilities: Not At Risk (10/14/2023)  Financial Resource Strain: Medium Risk (05/02/2021)   Received from [facility]  Physical Activity: Inactive (08/25/2018)   Received from [facility]  Social Connections: Unknown (10/14/2023)  Stress: No Stress Concern Present (08/25/2018)   Received from [facility]  Tobacco Use: Medium Risk (10/15/2023)  Health Literacy: High Risk (05/04/2022)   Received from [facility]    Readmission Risk Interventions    09/01/2023   12:00 PM  Readmission Risk Prevention Plan  Transportation Screening Complete  Medication Review (RN Care Manager) Complete  PCP or Specialist appointment within 3-5 days of discharge Complete  HRI or Home Care Consult Complete   SW Recovery Care/Counseling Consult Complete  Palliative Care Screening Not Applicable  Skilled Nursing Facility Complete

## 2023-10-18 NOTE — Progress Notes (Signed)
 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 - Taken to the OR 9/10 for I&D removal of hardware-ID engaged for antibiotic recommendations   #CKD stage III   #History of tobacco use -Recommend tobacco cessation for optimal wound healing Recommendations:  -Continue vancomycin  and cefepime  - Follow or cultures for sensitivities growing Enterobacter cloacae and Klebsiella pneumoniae so far - PICC line is in place, will place OPAT orders 6 weeks of IV antibiotics with daptomycin  and cefepime    OPAT ORDERS:  Diagnosis: HW infection status post removal  Culture Result: Enterobacter Cloqu[place] and Klebsiella pneumonia  Allergies  Allergen Reactions   Losartan Other (See Comments)    Hyperkalemia (>5.5 multiple times on losartan 25mg )   Chicken Allergy Nausea And Vomiting        Poultry Meal Nausea And Vomiting     Discharge antibiotics to be given via PICC line:  Per pharmacy protocol daptomycin  500mg  IV q24h + cefepime  2g IV q12h    Duration: 6 weeks End Date: 10/21  [facility] Care Per Protocol with [employer] Use: Home health RN for IV administration and teaching, line care and labs.    Labs weekly while on IV antibiotics: x__ CBC with differential __ BMP **TWICE WEEKLY ON VANCOMYCIN   x__ CMP _x_ CRP _x_ ESR __ Vancomycin  trough TWICE WEEKLY _x_ CK  __ Please pull PIC at completion of IV antibiotics _x_ Please leave PIC in place until doctor has seen patient or been notified  Fax weekly labs to [phone]  Clinic Follow Up Appt: 9/30  @ [facility] with Ailen Strauch    .

## 2023-10-19 DIAGNOSIS — S81001S Unspecified open wound, right knee, sequela: Secondary | ICD-10-CM | POA: Diagnosis not present

## 2023-10-19 LAB — CBC WITH DIFFERENTIAL/PLATELET
Abs Immature Granulocytes: 0.11 K/uL — ABNORMAL HIGH (ref 0.00–0.07)
Basophils Absolute: 0 K/uL (ref 0.0–0.1)
Basophils Relative: 1 %
Eosinophils Absolute: 0.6 K/uL — ABNORMAL HIGH (ref 0.0–0.5)
Eosinophils Relative: 8 %
HCT: 28.6 % — ABNORMAL LOW (ref 39.0–52.0)
Hemoglobin: 8.7 g/dL — ABNORMAL LOW (ref 13.0–17.0)
Immature Granulocytes: 1 %
Lymphocytes Relative: 17 %
Lymphs Abs: 1.3 K/uL (ref 0.7–4.0)
MCH: 27.4 pg (ref 26.0–34.0)
MCHC: 30.4 g/dL (ref 30.0–36.0)
MCV: 89.9 fL (ref 80.0–100.0)
Monocytes Absolute: 0.8 K/uL (ref 0.1–1.0)
Monocytes Relative: 11 %
Neutro Abs: 4.7 K/uL (ref 1.7–7.7)
Neutrophils Relative %: 62 %
Platelets: 198 K/uL (ref 150–400)
RBC: 3.18 MIL/uL — ABNORMAL LOW (ref 4.22–5.81)
RDW: 15.9 % — ABNORMAL HIGH (ref 11.5–15.5)
WBC: 7.6 K/uL (ref 4.0–10.5)
nRBC: 0 % (ref 0.0–0.2)

## 2023-10-19 LAB — CULTURE, BLOOD (ROUTINE X 2)
Culture: NO GROWTH
Culture: NO GROWTH

## 2023-10-19 LAB — BASIC METABOLIC PANEL WITH GFR
Anion gap: 7 (ref 5–15)
BUN: 25 mg/dL — ABNORMAL HIGH (ref 8–23)
CO2: 26 mmol/L (ref 22–32)
Calcium: 8.5 mg/dL — ABNORMAL LOW (ref 8.9–10.3)
Chloride: 104 mmol/L (ref 98–111)
Creatinine, Ser: 1.25 mg/dL — ABNORMAL HIGH (ref 0.61–1.24)
GFR, Estimated: 58 mL/min — ABNORMAL LOW (ref >60)
Glucose, Bld: 134 mg/dL — ABNORMAL HIGH (ref 70–99)
Potassium: 4.8 mmol/L (ref 3.5–5.1)
Sodium: 137 mmol/L (ref 135–145)

## 2023-10-19 LAB — GLUCOSE, CAPILLARY
Glucose-Capillary: 166 mg/dL — ABNORMAL HIGH (ref 70–99)
Glucose-Capillary: 116 mg/dL — ABNORMAL HIGH (ref 70–99)
Glucose-Capillary: 169 mg/dL — ABNORMAL HIGH (ref 70–99)
Glucose-Capillary: 61 mg/dL — ABNORMAL LOW (ref 70–99)
Glucose-Capillary: 107 mg/dL — ABNORMAL HIGH (ref 70–99)

## 2023-10-19 MED ORDER — SODIUM CHLORIDE 0.9 % IV SOLN
2.0000 g | Freq: Two times a day (BID) | INTRAVENOUS | Status: DC
  Administered 2023-10-19 – 2023-10-24 (×10): 2 g via INTRAVENOUS
  Filled 2023-10-19 (×10): qty 12.5

## 2023-10-19 NOTE — Progress Notes (Signed)
 PROGRESS NOTE  AIMAN NOE FMW:[id number] DOB: March 29, 1945 DOA: 10/14/2023 PCP: [facility]   LOS: 5 days   Brief Narrative / Interim history: 78 year old male with DM2, HTN comes into the hospital with wound dehiscence.  He was admitted for leg injury on July 19 with right tibial fracture, underwent immobilization with a long-leg splint with ORIF 7/21, discharged 7/28.  He was seen in the ED mid August with concern for wound infection and started on doxycycline .  Did not really improve and came back to the hospital.  Orthopedic surgery consulted and he was taken to the OR on 9/10for remmooval of hardware.  Subjective / 24h Interval events: The patient has had a PICCline placed and OPAT orders are in place. He will need to have home health set up for him to get his long term IV antibiotics. ID recommends 6 weeks of IV antibiotics. Cultures have grown out enterobacter Cloquel and Klebsiella pneumoniae. He will receive 6 weeks of IV daptomycin  and cefepime .  Assesement and Plan: Principal problem Right leg wound/hardware infection -patient was admitted to the hospital and placed on IV antibiotics, continue.  Orthopedic surgery consulted, he was taken to the OR on 9/10 status post hardware removal, I&D right proximal tibia, skin graft, long-leg splint and wound VAC placement - Continue to monitor postoperatively, intraoperative cultures have grown out enterobacter Cloquel and Klebsiella pneumoniae.. - Pt is receiving IV cefepime  and klebsiella pneumoniae. - Blood cultures have had no growth to date.  Active problems History of tobacco use -no documentation of COPD but he is on inhalers and home O2, continue.  Has increasing cough this morning, he is already on antibiotics as above, obtain a chest x-ray.  He tells me that his cough comes and goes.  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  Anemia - of chronic disease, hemoglobin stable, no bleeding  DM2 -A1c 7.6 which is appropriate in his age group.  Hold metformin   Lab Results  Component Value Date   HGBA1C 7.6 (H) 08/10/2023    Scheduled Meds:  acetaminophen   650 mg Oral Q6H   Or   acetaminophen   650 mg Rectal Q6H   aspirin   325 mg Oral Daily   budesonide -glycopyrrolate -formoterol   2 puff Inhalation BID   Chlorhexidine  Gluconate Cloth  6 each Topical Daily   docusate sodium   100 mg Oral BID   gabapentin   300 mg Oral QHS   insulin  aspart  0-15 Units Subcutaneous TID WC   insulin  aspart  0-5 Units Subcutaneous QHS   pantoprazole   40 mg Oral Daily   Continuous Infusions:  ceFEPime  (MAXIPIME ) IV     DAPTOmycin  500 mg (10/19/23 1411)   PRN Meds:.albuterol , bisacodyl , diphenhydrAMINE , guaiFENesin -dextromethorphan , hydrALAZINE , methocarbamol  **OR** methocarbamol  (ROBAXIN ) injection, metoCLOPramide  **OR** metoCLOPramide  (REGLAN ) injection, morphine  injection, nicotine , ondansetron  **OR** ondansetron  (ZOFRAN ) IV, oxyCODONE , polyethylene glycol, sodium chloride  flush  Current Outpatient Medications  Medication Instructions   albuterol  (PROVENTIL  HFA;VENTOLIN  HFA) 108 (90 Base) MCG/ACT inhaler 2 puffs, Inhalation, Every 6 hours PRN   doxycycline  (VIBRA -TABS) 100 mg, Oral, 2 times daily   feeding supplement, GLUCERNA SHAKE, (GLUCERNA SHAKE) LIQD 237 mLs, Oral, 3 times daily between meals   gabapentin  (NEURONTIN ) 300 mg, Daily at bedtime   HYDROcodone-acetaminophen  (NORCO/VICODIN) 5-325 MG tablet  1 tablet, Oral, Every 8 hours PRN   ipratropium-albuterol  (DUONEB) 0.5-2.5 (3) MG/3ML SOLN 3 mLs, Nebulization, Every 4 hours PRN   metFORMIN  (GLUCOPHAGE ) 500 mg, 2 times daily with meals   methocarbamol  (ROBAXIN ) 500 mg, Oral,  Every 6 hours PRN   oxyCODONE  (ROXICODONE ) 2.5-5 mg, Oral, Every 4 hours PRN   pantoprazole  (PROTONIX ) 40 mg, Daily   polyethylene glycol (MIRALAX  / GLYCOLAX ) 17 g, Oral, Daily   senna-docusate (SENOKOT-S) 8.6-50 MG tablet 1 tablet, Oral, Daily at bedtime   TRELEGY ELLIPTA 200-62.5-25 MCG/ACT AEPB 1 puff, Daily   vitamin D3 (CHOLECALCIFEROL ) 1,000 Units, Oral, Daily    Diet Orders (From admission, onward)     Start     Ordered   10/15/23 1106  Diet Carb Modified Fluid consistency: Thin; Room service appropriate? Yes  Diet effective now       Question Answer Comment  Calorie Level Medium 1600-2000   Fluid consistency: Thin   Room service appropriate? Yes      10/15/23 1106            DVT prophylaxis: SCDs Start: 10/15/23 1137   Lab Results  Component Value Date   PLT 198 10/19/2023      Code Status: Full Code  Family Communication: no family at bedside   Status is: Inpatient Remains inpatient appropriate because: severity of illness  Level of care: Telemetry Medical  Consultants:  Orthopedic surgery  Objective: Vitals:   10/18/23 1941 10/18/23 2008 10/19/23 0400 10/19/23 0907  BP: 132/76  124/66 (!) 124/59  Pulse: 73  78 75  Resp: 18  18 16   Temp: 98.1 F (36.7 C)  97.6 F (36.4 C) 97.8 F (36.6 C)  TempSrc: Oral  Oral Oral  SpO2: 98% 97% 98% 99%  Weight:      Height:        Intake/Output Summary (Last 24 hours) at 10/19/2023 1643 Last data filed at 10/19/2023 1300 Gross per 24 hour  Intake 1200 ml  Output 1620 ml  Net -420 ml   Wt Readings from Last 3 Encounters:  10/14/23 65.8 kg  09/24/23 68 kg  08/23/23 68 kg    Examination:  Exam:  Constitutional:  The patient is awake, alert, and oriented x 3. No acute distress. Eyes:  pupils and irises appear normal Normal lids and conjunctivae ENMT:  grossly normal hearing  Lips appear normal external ears, nose appear normal Oropharynx: mucosa, tongue,posterior pharynx appear normal Neck:   neck appears normal, no masses, normal ROM, supple no thyromegaly Respiratory:  No increased work of breathing. No wheezes, rales, or rhonchi No tactile fremitus Cardiovascular:  Regular rate and rhythm No murmurs, ectopy, or gallups. No lateral PMI. No thrills. Abdomen:  Abdomen is soft, non-tender, non-distended No hernias, masses, or organomegaly Normoactive bowel sounds.  Musculoskeletal:  No cyanosis, clubbing, or edema Right lowere extremity is bandaged. Skin:  No rashes, lesions, ulcers palpation of skin: no induration or nodules Neurologic:  CN 2-12 intact Sensation all 4 extremities intact Psychiatric:  Mental status Mood, affect appropriate Orientation to person, place, time  judgment and insight appear intact    Data Reviewed: I have independently reviewed following labs and imaging studies   CBC Recent Labs  Lab 10/14/23 1445 10/16/23 0328 10/17/23 0335 10/18/23 0232 10/19/23 0442  WBC 8.7 7.4 7.3 7.8 7.6  HGB 11.3* 9.2* 8.4* 9.3* 8.7*  HCT 39.1 29.4* 27.4* 30.4* 28.6*  PLT 247 207 204 214 198  MCV 94.7 87.8 88.4 89.7 89.9  MCH 27.4 27.5 27.1 27.4 27.4  MCHC 28.9* 31.3 30.7 30.6 30.4  RDW 15.8* 15.2 15.7* 15.7* 15.9*  LYMPHSABS 1.1  --   --   --  1.3  MONOABS 0.8  --   --   --  0.8  EOSABS 0.4  --   --   --  0.6*  BASOSABS 0.1  --   --   --  0.0    Recent Labs  Lab 10/14/23 1445 10/16/23 0328 10/17/23 0335 10/18/23 0232 10/19/23 0442  NA 136 136 136 135 137  K 4.9 5.2* 4.9 4.8 4.8  CL 106 103 104 106 104  CO2 20* 24 25 22 26   GLUCOSE 111* 141* 131* 124* 134*  BUN 24* 20 27* 27* 25*  CREATININE 1.62* 1.39* 1.64* 1.51* 1.25*  CALCIUM 8.8* 8.6* 8.3* 8.5* 8.5*  AST 25 14*  --   --   --   ALT 9 6  --   --   --   ALKPHOS 104 67  --   --   --   BILITOT 0.5 0.6  --   --   --   ALBUMIN  2.9* 2.1*  --   --   --   MG  --  1.5* 2.0 1.9  --      ------------------------------------------------------------------------------------------------------------------ No results for input(s): CHOL, HDL, LDLCALC, TRIG, CHOLHDL, LDLDIRECT in the last 72 hours.  Lab Results  Component Value Date   HGBA1C 7.6 (H) 08/10/2023   ------------------------------------------------------------------------------------------------------------------ No results for input(s): TSH, T4TOTAL, T3FREE, THYROIDAB in the last 72 hours.  Invalid input(s): FREET3  Cardiac Enzymes No results for input(s): CKMB, TROPONINI, MYOGLOBIN in the last 168 hours.  Invalid input(s): CK ------------------------------------------------------------------------------------------------------------------    Component Value Date/Time   BNP 69.0 04/17/2016 0422    CBG: Recent Labs  Lab 10/18/23 2007 10/18/23 2311 10/19/23 0829 10/19/23 1134 10/19/23 1611  GLUCAP 141* 145* 166* 116* 169*    Recent Results (from the past 240 hours)  Blood culture (routine x 2)     Status: None   Collection Time: 10/14/23  2:45 PM   Specimen: BLOOD  Result Value Ref Range Status   Specimen Description BLOOD SITE NOT SPECIFIED  Final   Special Requests   Final    BOTTLES DRAWN AEROBIC AND ANAEROBIC Blood Culture results may not be optimal due to an inadequate volume of blood received in culture bottles   Culture   Final    NO GROWTH 5 DAYS Performed at [facility], [address], [place], [place] [address]    Report Status 10/19/2023 FINAL  Final  Blood culture (routine x 2)     Status: None   Collection Time: 10/14/23  2:45 PM   Specimen: BLOOD  Result Value Ref Range Status   Specimen Description BLOOD SITE NOT SPECIFIED  Final   Special Requests   Final    BOTTLES DRAWN AEROBIC AND ANAEROBIC Blood Culture results may not be optimal due to an inadequate volume of blood received in culture bottles   Culture   Final    NO GROWTH 5  DAYS Performed at [facility], [address] [place], [place] [address]    Report Status 10/19/2023 FINAL  Final  MRSA Next Gen by PCR, Nasal     Status: None   Collection Time: 10/15/23  6:02 AM   Specimen: Nasal Mucosa; Nasal Swab  Result Value Ref Range Status   MRSA by PCR Next Gen NOT  DETECTED NOT DETECTED Final    Comment: (NOTE) The GeneXpert MRSA Assay (FDA approved for NASAL specimens only), is one component of a comprehensive MRSA colonization surveillance program. It is not intended to diagnose MRSA infection nor to guide or monitor treatment for MRSA infections. Test performance is not FDA approved in patients less than 38 years old. Performed at [facility], [address] [place], [place] [address]   Aerobic/Anaerobic Culture w Gram Stain (surgical/deep wound)     Status: None (Preliminary result)   Collection Time: 10/15/23  9:22 AM   Specimen: Leg, Right; Tissue  Result Value Ref Range Status   Specimen Description TISSUE  Final   Special Requests RIGHT TIBIAL INFECTION 1  Final   Gram Stain   Final    NO WBC SEEN NO ORGANISMS SEEN Performed at [facility], [address] [place], [place] [address]    Culture   Final    RARE ENTEROBACTER CLOACAE FEW KLEBSIELLA PNEUMONIAE NO ANAEROBES ISOLATED; CULTURE IN PROGRESS FOR 5 DAYS    Report Status PENDING  Incomplete   Organism ID, Bacteria ENTEROBACTER CLOACAE  Final   Organism ID, Bacteria KLEBSIELLA PNEUMONIAE  Final      Susceptibility   Enterobacter cloacae - MIC*    CEFEPIME  0.25 SENSITIVE Sensitive     ERTAPENEM <=0.12 SENSITIVE Sensitive     CIPROFLOXACIN 0.25 SENSITIVE Sensitive     GENTAMICIN <=1 SENSITIVE Sensitive     MEROPENEM <=0.25 SENSITIVE Sensitive     TRIMETH/SULFA <=20 SENSITIVE Sensitive     PIP/TAZO Value in next row Sensitive ug/mL     8 SENSITIVEThis is a modified FDA-approved test that has been validated and its performance characteristics determined by the  reporting laboratory.  This laboratory is certified under the Clinical Laboratory Improvement Amendments CLIA as qualified to perform high complexity clinical laboratory testing.    * RARE ENTEROBACTER CLOACAE   Klebsiella pneumoniae - MIC*    AMPICILLIN Value in next row Resistant      8 SENSITIVEThis is a modified FDA-approved test that has been validated and its performance characteristics determined by the reporting laboratory.  This laboratory is certified under the Clinical Laboratory Improvement Amendments CLIA as qualified to perform high complexity clinical laboratory testing.    CEFAZOLIN  (NON-URINE) Value in next row Sensitive      8 SENSITIVEThis is a modified FDA-approved test that has been validated and its performance characteristics determined by the reporting laboratory.  This laboratory is certified under the Clinical Laboratory Improvement Amendments CLIA as qualified to perform high complexity clinical laboratory testing.    CEFEPIME  Value in next row Sensitive      8 SENSITIVEThis is a modified FDA-approved test that has been validated and its performance characteristics determined by the reporting laboratory.  This laboratory is certified under the Clinical Laboratory Improvement Amendments CLIA as qualified to perform high complexity clinical laboratory testing.    ERTAPENEM Value in next row Sensitive      8 SENSITIVEThis is a modified FDA-approved test that has been validated and its performance characteristics determined by the reporting laboratory.  This laboratory is certified under the Clinical Laboratory Improvement Amendments CLIA as qualified to perform high complexity clinical laboratory testing.    CEFTRIAXONE Value in next row Sensitive      8 SENSITIVEThis is a modified FDA-approved test that has been validated and its performance characteristics determined by the reporting laboratory.  This laboratory is certified under the Clinical Laboratory Improvement  Amendments  CLIA as qualified to perform high complexity clinical laboratory testing.    CIPROFLOXACIN Value in next row Sensitive      8 SENSITIVEThis is a modified FDA-approved test that has been validated and its performance characteristics determined by the reporting laboratory.  This laboratory is certified under the Clinical Laboratory Improvement Amendments CLIA as qualified to perform high complexity clinical laboratory testing.    GENTAMICIN Value in next row Sensitive      8 SENSITIVEThis is a modified FDA-approved test that has been validated and its performance characteristics determined by the reporting laboratory.  This laboratory is certified under the Clinical Laboratory Improvement Amendments CLIA as qualified to perform high complexity clinical laboratory testing.    MEROPENEM Value in next row Sensitive      8 SENSITIVEThis is a modified FDA-approved test that has been validated and its performance characteristics determined by the reporting laboratory.  This laboratory is certified under the Clinical Laboratory Improvement Amendments CLIA as qualified to perform high complexity clinical laboratory testing.    TRIMETH/SULFA Value in next row Sensitive      8 SENSITIVEThis is a modified FDA-approved test that has been validated and its performance characteristics determined by the reporting laboratory.  This laboratory is certified under the Clinical Laboratory Improvement Amendments CLIA as qualified to perform high complexity clinical laboratory testing.    AMPICILLIN/SULBACTAM Value in next row Sensitive      8 SENSITIVEThis is a modified FDA-approved test that has been validated and its performance characteristics determined by the reporting laboratory.  This laboratory is certified under the Clinical Laboratory Improvement Amendments CLIA as qualified to perform high complexity clinical laboratory testing.    PIP/TAZO Value in next row Sensitive ug/mL     <=4 SENSITIVEThis is a modified  FDA-approved test that has been validated and its performance characteristics determined by the reporting laboratory.  This laboratory is certified under the Clinical Laboratory Improvement Amendments CLIA as qualified to perform high complexity clinical laboratory testing.    * FEW KLEBSIELLA PNEUMONIAE  Aerobic/Anaerobic Culture w Gram Stain (surgical/deep wound)     Status: None (Preliminary result)   Collection Time: 10/15/23  9:24 AM   Specimen: Leg, Right; Tissue  Result Value Ref Range Status   Specimen Description TISSUE  Final   Special Requests RIGHT TIBIAL INFECTION 2  Final   Gram Stain   Final    NO WBC SEEN RARE GRAM POSITIVE COCCI IN CLUSTERS Performed at [facility], [address], [place], [place] [address]    Culture   Final    FEW ENTEROBACTER CLOACAE RARE KLEBSIELLA PNEUMONIAE NO ANAEROBES ISOLATED; CULTURE IN PROGRESS FOR 5 DAYS    Report Status PENDING  Incomplete   Organism ID, Bacteria ENTEROBACTER CLOACAE  Final   Organism ID, Bacteria KLEBSIELLA PNEUMONIAE  Final      Susceptibility   Enterobacter cloacae - MIC*    CEFEPIME  <=0.12 SENSITIVE Sensitive     ERTAPENEM <=0.12 SENSITIVE Sensitive     CIPROFLOXACIN <=0.06 SENSITIVE Sensitive     GENTAMICIN <=1 SENSITIVE Sensitive     MEROPENEM <=0.25 SENSITIVE Sensitive     TRIMETH/SULFA <=20 SENSITIVE Sensitive     PIP/TAZO Value in next row Sensitive ug/mL     <=4 SENSITIVEThis is a modified FDA-approved test that has been validated and its performance characteristics determined by the reporting laboratory.  This laboratory is certified under the Clinical Laboratory Improvement Amendments CLIA as qualified to perform high complexity clinical laboratory  testing.    * FEW ENTEROBACTER CLOACAE   Klebsiella pneumoniae - MIC*    AMPICILLIN Value in next row Resistant      <=4 SENSITIVEThis is a modified FDA-approved test that has been validated and its performance characteristics determined by the reporting  laboratory.  This laboratory is certified under the Clinical Laboratory Improvement Amendments CLIA as qualified to perform high complexity clinical laboratory testing.    CEFAZOLIN  (NON-URINE) Value in next row Sensitive      <=4 SENSITIVEThis is a modified FDA-approved test that has been validated and its performance characteristics determined by the reporting laboratory.  This laboratory is certified under the Clinical Laboratory Improvement Amendments CLIA as qualified to perform high complexity clinical laboratory testing.    CEFEPIME  Value in next row Sensitive      <=4 SENSITIVEThis is a modified FDA-approved test that has been validated and its performance characteristics determined by the reporting laboratory.  This laboratory is certified under the Clinical Laboratory Improvement Amendments CLIA as qualified to perform high complexity clinical laboratory testing.    ERTAPENEM Value in next row Sensitive      <=4 SENSITIVEThis is a modified FDA-approved test that has been validated and its performance characteristics determined by the reporting laboratory.  This laboratory is certified under the Clinical Laboratory Improvement Amendments CLIA as qualified to perform high complexity clinical laboratory testing.    CEFTRIAXONE Value in next row Sensitive      <=4 SENSITIVEThis is a modified FDA-approved test that has been validated and its performance characteristics determined by the reporting laboratory.  This laboratory is certified under the Clinical Laboratory Improvement Amendments CLIA as qualified to perform high complexity clinical laboratory testing.    CIPROFLOXACIN Value in next row Sensitive      <=4 SENSITIVEThis is a modified FDA-approved test that has been validated and its performance characteristics determined by the reporting laboratory.  This laboratory is certified under the Clinical Laboratory Improvement Amendments CLIA as qualified to perform high complexity clinical  laboratory testing.    GENTAMICIN Value in next row Sensitive      <=4 SENSITIVEThis is a modified FDA-approved test that has been validated and its performance characteristics determined by the reporting laboratory.  This laboratory is certified under the Clinical Laboratory Improvement Amendments CLIA as qualified to perform high complexity clinical laboratory testing.    MEROPENEM Value in next row Sensitive      <=4 SENSITIVEThis is a modified FDA-approved test that has been validated and its performance characteristics determined by the reporting laboratory.  This laboratory is certified under the Clinical Laboratory Improvement Amendments CLIA as qualified to perform high complexity clinical laboratory testing.    TRIMETH/SULFA Value in next row Sensitive      <=4 SENSITIVEThis is a modified FDA-approved test that has been validated and its performance characteristics determined by the reporting laboratory.  This laboratory is certified under the Clinical Laboratory Improvement Amendments CLIA as qualified to perform high complexity clinical laboratory testing.    AMPICILLIN/SULBACTAM Value in next row Sensitive      <=4 SENSITIVEThis is a modified FDA-approved test that has been validated and its performance characteristics determined by the reporting laboratory.  This laboratory is certified under the Clinical Laboratory Improvement Amendments CLIA as qualified to perform high complexity clinical laboratory testing.    PIP/TAZO Value in next row Sensitive ug/mL     <=4 SENSITIVEThis is a modified FDA-approved test that has been validated and its performance characteristics determined by the reporting  laboratory.  This laboratory is certified under the Clinical Laboratory Improvement Amendments CLIA as qualified to perform high complexity clinical laboratory testing.    * RARE KLEBSIELLA PNEUMONIAE     Radiology Studies: No results found.   Bradden Tadros, DO [facility] 10/19/2023, 4:44  PM  Between 7 am - 7 pm I am available, please contact me via Amion (for emergencies) or Securechat (non urgent messages)  Between 7 pm - 7 am I am not available, please contact night coverage MD/APP via Amion

## 2023-10-19 NOTE — Progress Notes (Signed)
 Hypoglycemic Event  CBG: 61 at 2005  Treatment: 4 oz juice/soda  Symptoms: Shaky  Follow-up CBG: Time:  2030 CBG Result:107  Possible Reasons for Event: Unknown  Comments/MD notified: Provider on call, Blondie NP, notified.  Glade Lee BSN RN CMSRN 10/19/2023, 8:38 PM

## 2023-10-19 NOTE — Plan of Care (Signed)
  Problem: Education: Goal: Ability to describe self-care measures that may prevent or decrease complications (Diabetes Survival Skills Education) will improve Outcome: Progressing   Problem: Coping: Goal: Ability to adjust to condition or change in health will improve Outcome: Progressing   Problem: Health Behavior/Discharge Planning: Goal: Ability to manage health-related needs will improve Outcome: Progressing   Problem: Metabolic: Goal: Ability to maintain appropriate glucose levels will improve Outcome: Progressing   Problem: Nutritional: Goal: Maintenance of adequate nutrition will improve Outcome: Progressing   Problem: Clinical Measurements: Goal: Will remain free from infection Outcome: Progressing Goal: Diagnostic test results will improve Outcome: Progressing

## 2023-10-20 DIAGNOSIS — S81001S Unspecified open wound, right knee, sequela: Secondary | ICD-10-CM

## 2023-10-20 LAB — GLUCOSE, CAPILLARY
Glucose-Capillary: 167 mg/dL — ABNORMAL HIGH (ref 70–99)
Glucose-Capillary: 120 mg/dL — ABNORMAL HIGH (ref 70–99)
Glucose-Capillary: 147 mg/dL — ABNORMAL HIGH (ref 70–99)
Glucose-Capillary: 170 mg/dL — ABNORMAL HIGH (ref 70–99)
Glucose-Capillary: 196 mg/dL — ABNORMAL HIGH (ref 70–99)

## 2023-10-20 LAB — AEROBIC/ANAEROBIC CULTURE W GRAM STAIN (SURGICAL/DEEP WOUND)
Gram Stain: NONE SEEN
Gram Stain: NONE SEEN

## 2023-10-20 MED ORDER — INSULIN ASPART 100 UNIT/ML IJ SOLN
0.0000 [IU] | Freq: Three times a day (TID) | INTRAMUSCULAR | Status: DC
  Administered 2023-10-20 – 2023-10-21 (×2): 1 [IU] via SUBCUTANEOUS
  Administered 2023-10-21: 2 [IU] via SUBCUTANEOUS
  Administered 2023-10-22: 1 [IU] via SUBCUTANEOUS
  Administered 2023-10-22: 2 [IU] via SUBCUTANEOUS
  Administered 2023-10-23 (×2): 1 [IU] via SUBCUTANEOUS
  Administered 2023-10-24 (×2): 2 [IU] via SUBCUTANEOUS

## 2023-10-20 MED ORDER — INSULIN ASPART 100 UNIT/ML IJ SOLN: 0.0000 [IU] | Freq: Every day | INTRAMUSCULAR | Status: DC

## 2023-10-20 NOTE — Progress Notes (Signed)
 PROGRESS NOTE    Joe Coleman  FMW:[id number] DOB: [birth year] DOA: 10/14/2023 PCP: [facility]   Brief Narrative:  This 78 year old Male with PMH significant for DM2, HTN comes into the hospital with wound dehiscence. He was admitted for leg injury on July 19 with right tibial fracture, underwent immobilization with a long-leg splint with ORIF 08/25/23, discharged 7/28. He was seen in the ED mid August with concern for wound infection and started on doxycycline . Did not really improve and came back to the hospital. Orthopedic surgery consulted and he was taken to the OR on 9/10 /25 for remmooval of hardware.  The patient has had a PICCline placed and OPAT orders are in place. He will need to have home health set up for him to get his long term IV antibiotics. ID recommends 6 weeks of IV antibiotics. Cultures have grown out enterobacter Cloquel and Klebsiella pneumoniae. He will receive 6 weeks of IV daptomycin  and cefepime .   Assessment & Plan:   Principal Problem:   Open knee wound, right, sequela   Right leg wound / Hardware infection: Patient was admitted to the hospital and placed on IV antibiotics. Orthopedic surgery consulted, he was taken to the OR on 9/10 status post hardware removal, I&D right proximal tibia, skin graft, long-leg splint and wound VAC placement. Continue to monitor postoperatively, intraoperative cultures have grown out enterobacter Cloquel and Klebsiella pneumoniae.. Pt is receiving IV cefepime  and Daptomycin . Blood cultures have had no growth to date. Patient has had a PICC line placed and OPAT orders are in place. Patient will need 6 weeks of IV antibiotics(daptomycin  and cefepime )   History of tobacco use : No documentation of COPD but he is on inhalers and home O2. He has increasing cough this morning, he is already on antibiotics . He tells me that his cough comes and goes.  Quit smoking 14 years ago.   Hyperkalemia: -Lokelma  x 1 9/11,  potassium now normalized.   Hypomagnesemia: Replaced.  Continue to monitor   Decreased pedal pulses: -ABIs fairly unremarkable, could not be obtained on the right lower extremity.   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   Anemia - of chronic disease: Hemoglobin stable, no bleeding.   DM2 -A1c 7.6 which is appropriate in his age group.  Hold metformin .   DVT prophylaxis: SCDs Code Status: Full code Family Communication:  No family at bed side Disposition Plan:  Status is: Inpatient Remains inpatient appropriate because:  Awaiting HHS arrangement, ID clearance.   Consultants:  Orthopedics ID  Procedures: Antimicrobials:  Anti-infectives (From admission, onward)    Start     Dose/Rate Route Frequency Ordered Stop   10/19/23 1715  ceFEPIme  (MAXIPIME ) 2 g in sodium chloride  0.9 % 100 mL IVPB        2 g 200 mL/hr over 30 Minutes Intravenous Every 12 hours 10/19/23 1000     10/17/23 1345  DAPTOmycin  (CUBICIN ) IVPB 500 mg/50mL premix        8 mg/kg  65.8 kg 100 mL/hr over 30 Minutes Intravenous Daily 10/17/23 1257     10/15/23 1715  vancomycin  (VANCOREADY) IVPB 750 mg/150 mL  Status:  Discontinued        750 mg 150 mL/hr over 60 Minutes Intravenous Every 24 hours 10/14/23 1707 10/17/23 1257   10/15/23 0930  vancomycin  (VANCOCIN ) powder  Status:  Discontinued          As needed 10/15/23 0930 10/15/23  1011   10/15/23 0930  tobramycin  (NEBCIN ) powder  Status:  Discontinued          As needed 10/15/23 0932 10/15/23 1011   10/15/23 0600  ceFAZolin  (ANCEF ) IVPB 2g/100 mL premix  Status:  Discontinued        2 g 200 mL/hr over 30 Minutes Intravenous On call to O.R. 10/15/23 0211 10/15/23 1128   10/14/23 1715  vancomycin  (VANCOREADY) IVPB 1250 mg/250 mL        1,250 mg 166.7 mL/hr over 90 Minutes Intravenous  Once 10/14/23 1707 10/14/23 2014   10/14/23 1700  ceFEPIme  (MAXIPIME ) 2 g in sodium chloride  0.9 % 100 mL IVPB  Status:   Discontinued        2 g 200 mL/hr over 30 Minutes Intravenous Every 12 hours 10/14/23 1707 10/19/23 1000   10/14/23 1445  ceFAZolin  (ANCEF ) IVPB 2g/100 mL premix        2 g 200 mL/hr over 30 Minutes Intravenous  Once 10/14/23 1441 10/14/23 1525      Subjective: Patient was seen and examined at bedside.  Overnight events noted. Patient reports doing much better , feels improved.  He is awaiting home health services and ID follow-up.  Objective: Vitals:   10/19/23 2022 10/20/23 0603 10/20/23 0715 10/20/23 0757  BP:  137/77 (!) 147/81   Pulse:  79 78   Resp:  18 16   Temp:  97.9 F (36.6 C) 98.3 F (36.8 C)   TempSrc:  Oral Oral   SpO2: 99% 99% 94% 91%  Weight:      Height:        Intake/Output Summary (Last 24 hours) at 10/20/2023 1332 Last data filed at 10/20/2023 0900 Gross per 24 hour  Intake 908.27 ml  Output 1450 ml  Net -541.73 ml   Filed Weights   10/14/23 1650  Weight: 65.8 kg    Examination:  General exam: Appears calm and comfortable, not in any acute distress. Respiratory system: Clear to auscultation. Respiratory effort normal.  RR 14 Cardiovascular system: S1 & S2 heard, RRR. No JVD, murmurs, rubs, gallops or clicks.  Gastrointestinal system: Abdomen is non distended, soft and non tender.  Normal bowel sounds heard. Central nervous system: Alert and oriented x 3. No focal neurological deficits. Extremities: Right lower extremity in dressing. Skin: No rashes, lesions or ulcers Psychiatry: Judgement and insight appear normal. Mood & affect appropriate.     Data Reviewed: I have personally reviewed following labs and imaging studies  CBC: Recent Labs  Lab 10/14/23 1445 10/16/23 0328 10/17/23 0335 10/18/23 0232 10/19/23 0442  WBC 8.7 7.4 7.3 7.8 7.6  NEUTROABS 6.3  --   --   --  4.7  HGB 11.3* 9.2* 8.4* 9.3* 8.7*  HCT 39.1 29.4* 27.4* 30.4* 28.6*  MCV 94.7 87.8 88.4 89.7 89.9  PLT 247 207 204 214 198   Basic Metabolic Panel: Recent Labs   Lab 10/14/23 1445 10/16/23 0328 10/17/23 0335 10/18/23 0232 10/19/23 0442  NA 136 136 136 135 137  K 4.9 5.2* 4.9 4.8 4.8  CL 106 103 104 106 104  CO2 20* 24 25 22 26   GLUCOSE 111* 141* 131* 124* 134*  BUN 24* 20 27* 27* 25*  CREATININE 1.62* 1.39* 1.64* 1.51* 1.25*  CALCIUM 8.8* 8.6* 8.3* 8.5* 8.5*  MG  --  1.5* 2.0 1.9  --    GFR: Estimated Creatinine Clearance: 43.9 mL/min (A) (by C-G formula based on SCr of 1.25 mg/dL (H)). Liver Function  Tests: Recent Labs  Lab 10/14/23 1445 10/16/23 0328  AST 25 14*  ALT 9 6  ALKPHOS 104 67  BILITOT 0.5 0.6  PROT 6.5 5.0*  ALBUMIN  2.9* 2.1*   No results for input(s): LIPASE, AMYLASE in the last 168 hours. No results for input(s): AMMONIA in the last 168 hours. Coagulation Profile: No results for input(s): INR, PROTIME in the last 168 hours. Cardiac Enzymes: Recent Labs  Lab 10/18/23 0232  CKTOTAL 22*   BNP (last 3 results) No results for input(s): PROBNP in the last 8760 hours. HbA1C: No results for input(s): HGBA1C in the last 72 hours. CBG: Recent Labs  Lab 10/19/23 1611 10/19/23 2005 10/19/23 2030 10/20/23 0759 10/20/23 1157  GLUCAP 169* 61* 107* 167* 120*   Lipid Profile: No results for input(s): CHOL, HDL, LDLCALC, TRIG, CHOLHDL, LDLDIRECT in the last 72 hours. Thyroid Function Tests: No results for input(s): TSH, T4TOTAL, FREET4, T3FREE, THYROIDAB in the last 72 hours. Anemia Panel: No results for input(s): VITAMINB12, FOLATE, FERRITIN, TIBC, IRON , RETICCTPCT in the last 72 hours. Sepsis Labs: No results for input(s): PROCALCITON, LATICACIDVEN in the last 168 hours.  Recent Results (from the past 240 hours)  Blood culture (routine x 2)     Status: None   Collection Time: 10/14/23  2:45 PM   Specimen: BLOOD  Result Value Ref Range Status   Specimen Description BLOOD SITE NOT SPECIFIED  Final   Special Requests   Final    BOTTLES DRAWN AEROBIC AND  ANAEROBIC Blood Culture results may not be optimal due to an inadequate volume of blood received in culture bottles   Culture   Final    NO GROWTH 5 DAYS Performed at [facility], [address], [place], [place] [address]    Report Status 10/19/2023 FINAL  Final  Blood culture (routine x 2)     Status: None   Collection Time: 10/14/23  2:45 PM   Specimen: BLOOD  Result Value Ref Range Status   Specimen Description BLOOD SITE NOT SPECIFIED  Final   Special Requests   Final    BOTTLES DRAWN AEROBIC AND ANAEROBIC Blood Culture results may not be optimal due to an inadequate volume of blood received in culture bottles   Culture   Final    NO GROWTH 5 DAYS Performed at [facility], [address] [place], [place] [address]    Report Status 10/19/2023 FINAL  Final  MRSA Next Gen by PCR, Nasal     Status: None   Collection Time: 10/15/23  6:02 AM   Specimen: Nasal Mucosa; Nasal Swab  Result Value Ref Range Status   MRSA by PCR Next Gen NOT DETECTED NOT DETECTED Final    Comment: (NOTE) The GeneXpert MRSA Assay (FDA approved for NASAL specimens only), is one component of a comprehensive MRSA colonization surveillance program. It is not intended to diagnose MRSA infection nor to guide or monitor treatment for MRSA infections. Test performance is not FDA approved in patients less than 61 years old. Performed at [facility], [address] [place], [place] [address]   Aerobic/Anaerobic Culture w Gram Stain (surgical/deep wound)     Status: None   Collection Time: 10/15/23  9:22 AM   Specimen: Leg, Right; Tissue  Result Value Ref Range Status   Specimen Description TISSUE  Final   Special Requests RIGHT TIBIAL INFECTION 1  Final   Gram Stain NO WBC SEEN NO ORGANISMS SEEN   Final  Culture   Final    RARE ENTEROBACTER CLOACAE FEW KLEBSIELLA PNEUMONIAE NO ANAEROBES ISOLATED Performed at [facility], [address] [place], [place] [address]    Report  Status 10/20/2023 FINAL  Final   Organism ID, Bacteria ENTEROBACTER CLOACAE  Final   Organism ID, Bacteria KLEBSIELLA PNEUMONIAE  Final      Susceptibility   Enterobacter cloacae - MIC*    CEFEPIME  0.25 SENSITIVE Sensitive     ERTAPENEM <=0.12 SENSITIVE Sensitive     CIPROFLOXACIN 0.25 SENSITIVE Sensitive     GENTAMICIN <=1 SENSITIVE Sensitive     MEROPENEM <=0.25 SENSITIVE Sensitive     TRIMETH/SULFA <=20 SENSITIVE Sensitive     PIP/TAZO Value in next row Sensitive ug/mL     8 SENSITIVEThis is a modified FDA-approved test that has been validated and its performance characteristics determined by the reporting laboratory.  This laboratory is certified under the Clinical Laboratory Improvement Amendments CLIA as qualified to perform high complexity clinical laboratory testing.    * RARE ENTEROBACTER CLOACAE   Klebsiella pneumoniae - MIC*    AMPICILLIN Value in next row Resistant      8 SENSITIVEThis is a modified FDA-approved test that has been validated and its performance characteristics determined by the reporting laboratory.  This laboratory is certified under the Clinical Laboratory Improvement Amendments CLIA as qualified to perform high complexity clinical laboratory testing.    CEFAZOLIN  (NON-URINE) Value in next row Sensitive      8 SENSITIVEThis is a modified FDA-approved test that has been validated and its performance characteristics determined by the reporting laboratory.  This laboratory is certified under the Clinical Laboratory Improvement Amendments CLIA as qualified to perform high complexity clinical laboratory testing.    CEFEPIME  Value in next row Sensitive      8 SENSITIVEThis is a modified FDA-approved test that has been validated and its performance characteristics determined by the reporting laboratory.  This laboratory is certified under the Clinical Laboratory Improvement Amendments CLIA as qualified to perform high complexity clinical laboratory testing.    ERTAPENEM  Value in next row Sensitive      8 SENSITIVEThis is a modified FDA-approved test that has been validated and its performance characteristics determined by the reporting laboratory.  This laboratory is certified under the Clinical Laboratory Improvement Amendments CLIA as qualified to perform high complexity clinical laboratory testing.    CEFTRIAXONE Value in next row Sensitive      8 SENSITIVEThis is a modified FDA-approved test that has been validated and its performance characteristics determined by the reporting laboratory.  This laboratory is certified under the Clinical Laboratory Improvement Amendments CLIA as qualified to perform high complexity clinical laboratory testing.    CIPROFLOXACIN Value in next row Sensitive      8 SENSITIVEThis is a modified FDA-approved test that has been validated and its performance characteristics determined by the reporting laboratory.  This laboratory is certified under the Clinical Laboratory Improvement Amendments CLIA as qualified to perform high complexity clinical laboratory testing.    GENTAMICIN Value in next row Sensitive      8 SENSITIVEThis is a modified FDA-approved test that has been validated and its performance characteristics determined by the reporting laboratory.  This laboratory is certified under the Clinical Laboratory Improvement Amendments CLIA as qualified to perform high complexity clinical laboratory testing.    MEROPENEM Value in next row Sensitive      8 SENSITIVEThis is a modified FDA-approved test that has been validated and its performance  characteristics determined by the reporting laboratory.  This laboratory is certified under the Clinical Laboratory Improvement Amendments CLIA as qualified to perform high complexity clinical laboratory testing.    TRIMETH/SULFA Value in next row Sensitive      8 SENSITIVEThis is a modified FDA-approved test that has been validated and its performance characteristics determined by the reporting  laboratory.  This laboratory is certified under the Clinical Laboratory Improvement Amendments CLIA as qualified to perform high complexity clinical laboratory testing.    AMPICILLIN/SULBACTAM Value in next row Sensitive      8 SENSITIVEThis is a modified FDA-approved test that has been validated and its performance characteristics determined by the reporting laboratory.  This laboratory is certified under the Clinical Laboratory Improvement Amendments CLIA as qualified to perform high complexity clinical laboratory testing.    PIP/TAZO Value in next row Sensitive ug/mL     <=4 SENSITIVEThis is a modified FDA-approved test that has been validated and its performance characteristics determined by the reporting laboratory.  This laboratory is certified under the Clinical Laboratory Improvement Amendments CLIA as qualified to perform high complexity clinical laboratory testing.    * FEW KLEBSIELLA PNEUMONIAE  Aerobic/Anaerobic Culture w Gram Stain (surgical/deep wound)     Status: None   Collection Time: 10/15/23  9:24 AM   Specimen: Leg, Right; Tissue  Result Value Ref Range Status   Specimen Description TISSUE  Final   Special Requests RIGHT TIBIAL INFECTION 2  Final   Gram Stain   Final    NO WBC SEEN RARE GRAM POSITIVE COCCI IN CLUSTERS    Culture   Final    FEW ENTEROBACTER CLOACAE RARE KLEBSIELLA PNEUMONIAE NO ANAEROBES ISOLATED Performed at [facility], [address] [place], [place] [address]    Report Status 10/20/2023 FINAL  Final   Organism ID, Bacteria ENTEROBACTER CLOACAE  Final   Organism ID, Bacteria KLEBSIELLA PNEUMONIAE  Final      Susceptibility   Enterobacter cloacae - MIC*    CEFEPIME  <=0.12 SENSITIVE Sensitive     ERTAPENEM <=0.12 SENSITIVE Sensitive     CIPROFLOXACIN <=0.06 SENSITIVE Sensitive     GENTAMICIN <=1 SENSITIVE Sensitive     MEROPENEM <=0.25 SENSITIVE Sensitive     TRIMETH/SULFA <=20 SENSITIVE Sensitive     PIP/TAZO Value in next row Sensitive  ug/mL     <=4 SENSITIVEThis is a modified FDA-approved test that has been validated and its performance characteristics determined by the reporting laboratory.  This laboratory is certified under the Clinical Laboratory Improvement Amendments CLIA as qualified to perform high complexity clinical laboratory testing.    * FEW ENTEROBACTER CLOACAE   Klebsiella pneumoniae - MIC*    AMPICILLIN Value in next row Resistant      <=4 SENSITIVEThis is a modified FDA-approved test that has been validated and its performance characteristics determined by the reporting laboratory.  This laboratory is certified under the Clinical Laboratory Improvement Amendments CLIA as qualified to perform high complexity clinical laboratory testing.    CEFAZOLIN  (NON-URINE) Value in next row Sensitive      <=4 SENSITIVEThis is a modified FDA-approved test that has been validated and its performance characteristics determined by the reporting laboratory.  This laboratory is certified under the Clinical Laboratory Improvement Amendments CLIA as qualified to perform high complexity clinical laboratory testing.    CEFEPIME  Value in next row Sensitive      <=4 SENSITIVEThis is a modified FDA-approved test that has been validated and its performance characteristics determined by  the reporting laboratory.  This laboratory is certified under the Clinical Laboratory Improvement Amendments CLIA as qualified to perform high complexity clinical laboratory testing.    ERTAPENEM Value in next row Sensitive      <=4 SENSITIVEThis is a modified FDA-approved test that has been validated and its performance characteristics determined by the reporting laboratory.  This laboratory is certified under the Clinical Laboratory Improvement Amendments CLIA as qualified to perform high complexity clinical laboratory testing.    CEFTRIAXONE Value in next row Sensitive      <=4 SENSITIVEThis is a modified FDA-approved test that has been validated and its  performance characteristics determined by the reporting laboratory.  This laboratory is certified under the Clinical Laboratory Improvement Amendments CLIA as qualified to perform high complexity clinical laboratory testing.    CIPROFLOXACIN Value in next row Sensitive      <=4 SENSITIVEThis is a modified FDA-approved test that has been validated and its performance characteristics determined by the reporting laboratory.  This laboratory is certified under the Clinical Laboratory Improvement Amendments CLIA as qualified to perform high complexity clinical laboratory testing.    GENTAMICIN Value in next row Sensitive      <=4 SENSITIVEThis is a modified FDA-approved test that has been validated and its performance characteristics determined by the reporting laboratory.  This laboratory is certified under the Clinical Laboratory Improvement Amendments CLIA as qualified to perform high complexity clinical laboratory testing.    MEROPENEM Value in next row Sensitive      <=4 SENSITIVEThis is a modified FDA-approved test that has been validated and its performance characteristics determined by the reporting laboratory.  This laboratory is certified under the Clinical Laboratory Improvement Amendments CLIA as qualified to perform high complexity clinical laboratory testing.    TRIMETH/SULFA Value in next row Sensitive      <=4 SENSITIVEThis is a modified FDA-approved test that has been validated and its performance characteristics determined by the reporting laboratory.  This laboratory is certified under the Clinical Laboratory Improvement Amendments CLIA as qualified to perform high complexity clinical laboratory testing.    AMPICILLIN/SULBACTAM Value in next row Sensitive      <=4 SENSITIVEThis is a modified FDA-approved test that has been validated and its performance characteristics determined by the reporting laboratory.  This laboratory is certified under the Clinical Laboratory Improvement Amendments  CLIA as qualified to perform high complexity clinical laboratory testing.    PIP/TAZO Value in next row Sensitive ug/mL     <=4 SENSITIVEThis is a modified FDA-approved test that has been validated and its performance characteristics determined by the reporting laboratory.  This laboratory is certified under the Clinical Laboratory Improvement Amendments CLIA as qualified to perform high complexity clinical laboratory testing.    * RARE KLEBSIELLA PNEUMONIAE    Radiology Studies: No results found.  Scheduled Meds:  acetaminophen   650 mg Oral Q6H   Or   acetaminophen   650 mg Rectal Q6H   aspirin   325 mg Oral Daily   budesonide -glycopyrrolate -formoterol   2 puff Inhalation BID   Chlorhexidine  Gluconate Cloth  6 each Topical Daily   docusate sodium   100 mg Oral BID   gabapentin   300 mg Oral QHS   insulin  aspart  0-5 Units Subcutaneous QHS   insulin  aspart  0-9 Units Subcutaneous TID WC   pantoprazole   40 mg Oral Daily   Continuous Infusions:  ceFEPime  (MAXIPIME ) IV 2 g (10/20/23 0607)   DAPTOmycin  Stopped (10/19/23 1441)     LOS: 6 days  Time spent: 50 mins    Darcel Dawley, MD [facility]   If 7PM-7AM, please contact night-coverage

## 2023-10-20 NOTE — Progress Notes (Signed)
 Physical Therapy Treatment Patient Details Name: Joe Coleman MRN: [id number] DOB: December 23, 1945 Today's Date: 10/20/2023   History of Present Illness Pt is 78 yo male who presents on 10/14/23 with surgical wound dehiscence R knee with surgical hardware visible. Pt had R tib fx with ORIF 7/25 with wound infection noted 8/28. On 9/10 pt underwent removal of hardware, I&D, skin graft, and wound vac placement. PMH: DM2, COPD, HTN, past tobacco abuse    PT Comments  PT session focused on functional transfers. Min assist bed mobility and min assist squat pivot transfers toward L. Unable to progress gait due to difficulty with maintaining NWB RLE. PT recommending SNF at d/c. Pt declining at this time stating he prefers to go home. He states he has a w/c at home and a friend that can provide 24/7 assist.     If plan is discharge home, recommend the following: A lot of help with walking and/or transfers;A little help with bathing/dressing/bathroom;Assistance with cooking/housework;Assist for transportation   Can travel by private vehicle     No  Equipment Recommendations  None recommended by PT    Recommendations for Other Services       Precautions / Restrictions Precautions Precautions: Fall Recall of Precautions/Restrictions: Intact Required Braces or Orthoses: Splint/Cast Splint/Cast: RLE long leg splint (prevents knee flexion) Splint/Cast - Date Prophylactic Dressing Applied (if applicable): 10/15/23 Restrictions RLE Weight Bearing Per Provider Order: Non weight bearing     Mobility  Bed Mobility Overal bed mobility: Needs Assistance Bed Mobility: Supine to Sit     Supine to sit: Min assist, HOB elevated, Used rails     General bed mobility comments: assist with RLE    Transfers Overall transfer level: Needs assistance Equipment used: None Transfers: Bed to chair/wheelchair/BSC Sit to Stand: Min assist           General transfer comment: squat pivot transfer  bed>BSC>recliner toward the L    Ambulation/Gait               General Gait Details: unable to progress due to difficulty with NWB RLE   Stairs             Wheelchair Mobility     Tilt Bed    Modified Rankin (Stroke Patients Only)       Balance Overall balance assessment: Needs assistance Sitting-balance support: No upper extremity supported, Feet supported Sitting balance-Leahy Scale: Good     Standing balance support: Bilateral upper extremity supported, During functional activity Standing balance-Leahy Scale: Poor Standing balance comment: reliant on external support                            Communication Communication Communication: No apparent difficulties  Cognition Arousal: Alert Behavior During Therapy: WFL for tasks assessed/performed   PT - Cognitive impairments: Safety/Judgement                         Following commands: Intact      Cueing Cueing Techniques: Verbal cues, Tactile cues  Exercises      General Comments General comments (skin integrity, edema, etc.): SpO2 94% on RA      Pertinent Vitals/Pain Pain Assessment Pain Assessment: Faces Faces Pain Scale: Hurts little more Pain Location: RLE Pain Descriptors / Indicators: Grimacing, Guarding Pain Intervention(s): Limited activity within patient's tolerance, Monitored during session, Repositioned    Home Living  Prior Function            PT Goals (current goals can now be found in the care plan section) Acute Rehab PT Goals Patient Stated Goal: home Progress towards PT goals: Progressing toward goals    Frequency    Min 2X/week      PT Plan      Co-evaluation              AM-PAC PT 6 Clicks Mobility   Outcome Measure  Help needed turning from your back to your side while in a flat bed without using bedrails?: A Little Help needed moving from lying on your back to sitting on the side of a  flat bed without using bedrails?: A Little Help needed moving to and from a bed to a chair (including a wheelchair)?: A Little Help needed standing up from a chair using your arms (e.g., wheelchair or bedside chair)?: A Lot Help needed to walk in hospital room?: Total Help needed climbing 3-5 steps with a railing? : Total 6 Click Score: 13    End of Session Equipment Utilized During Treatment: Gait belt Activity Tolerance: Patient tolerated treatment well Patient left: in chair;with call bell/phone within reach;with chair alarm set Nurse Communication: Mobility status PT Visit Diagnosis: Unsteadiness on feet (R26.81);History of falling (Z91.81);Difficulty in walking, not elsewhere classified (R26.2);Pain Pain - Right/Left: Right Pain - part of body: Leg     Time: 9056-8992 PT Time Calculation (min) (ACUTE ONLY): 24 min  Charges:    $Therapeutic Activity: 23-37 mins PT General Charges $$ ACUTE PT VISIT: 1 Visit                     Sari MATSU., PT  Office # [phone]    Erven Sari Shaker 10/20/2023, 10:43 AM

## 2023-10-20 NOTE — TOC Progression Note (Signed)
 Transition of Care [facility]) - Progression Note    Patient Details  Name: Joe Coleman MRN: [id number] Date of Birth: 19-Sep-1945  Transition of Care [facility]) CM/SW Contact  Marquette Blodgett LITTIE Moose, [place] Phone Number: 10/20/2023, 2:43 PM  Clinical Narrative:    CSW sent a referral to [facility]. Facility declined referral, CSW updated pt and he stated he would prefer to go home with home health than to a different facility. Pt also stated he has a friend who can help take care of him if he needed another person around for assistance. CSW updated pt care team and will continue to follow.    Expected Discharge Plan: Skilled Nursing Facility Barriers to Discharge: Insurance Authorization, Continued Medical Work up, SNF Pending bed offer               Expected Discharge Plan and Services       Living arrangements for the past 2 months: Apartment                                       Social Drivers of Health (SDOH) Interventions SDOH Screenings   Food Insecurity: No Food Insecurity (10/14/2023)  Housing: Unknown (10/14/2023)  Transportation Needs: No Transportation Needs (10/14/2023)  Utilities: Not At Risk (10/14/2023)  Financial Resource Strain: Medium Risk (05/02/2021)   Received from [facility]  Physical Activity: Inactive (08/25/2018)   Received from [facility]  Social Connections: Unknown (10/14/2023)  Stress: No Stress Concern Present (08/25/2018)   Received from [facility]  Tobacco Use: Medium Risk (10/15/2023)  Health Literacy: High Risk (05/04/2022)   Received from [facility]    Readmission Risk Interventions    09/01/2023   12:00 PM  Readmission Risk Prevention Plan  Transportation Screening Complete  Medication Review (RN Care Manager) Complete  PCP or Specialist appointment within 3-5 days of discharge Complete  HRI or Home Care Consult Complete  SW Recovery Care/Counseling Consult Complete  Palliative Care Screening  Not Applicable  Skilled Nursing Facility Complete

## 2023-10-20 NOTE — TOC Initial Note (Addendum)
 Transition of Care (TOC) - Initial/Assessment Note   Patient has decided to go home with home health at discharge instead of SNF for rehab.   Prior to admission patient was active with [facility] for HHRN,PT,OT and he would like to continue services with them.   NCM spoke to patient at bedside with his niece [name] on speaker phone and confirmed all of above.   Attending wants to discharge tomorrow. However, home VAC and home IV ABX have to be arranged,   NCM explained [facility] will submit script for home VAC to insurance company as soon as provider signs script (secure chatted attending and orthopedic MD and PAs) . Will also need orders for HHRN,PT,OT with VAC instructions in comment section of order for hhrn. Per notes change VAC outpatient.   HHRN will change VAC at home per surgeon order.   If home VAC approved , if will be delivered to bedside and hospital nurse will connect to dressing on day of discharge. He will also be given some dressing supplies to take home with him. More dressing supplies will be shipped to him. Randine will email Sarah PA script for home [facility]   Infusion company will be [employer]. They have a RN Pam who will come to the hospital prior to discharge and provide education on how to administer IV ABX at home. Patient will have a HHRN however, HHRN will not be present every time a dose is due .   Patient has walker , wheelchair and bedside commode at home already   [place] and Patient voiced understanding to all of above.   Patient will have someone with him at home 24/7 to assist. Warren is willing to be taught IV ABX and she is calling patient's friend Rosalynn to see if he can help.   Home VAC and IV ABX will need to be approved by insurance.   Patient will need ambulance transport home. NCM confirmed facesheet information.   Warren called Rosalynn will be in [place]  until late Tuesday night , she is asking if discharge can be Wednesday morning . Sent message to team   Patient Details  Name: Joe Coleman MRN: [id number] Date of Birth: [birth year]  Transition of Care [facility]) CM/SW Contact:    Stephane Powell Jansky, RN Phone Number: 10/20/2023, 3:17 PM  Clinical Narrative:                   Expected Discharge Plan: Home w Home Health Services Barriers to Discharge: Insurance Authorization, Continued Medical Work up, SNF Pending bed offer   Patient Goals and CMS Choice Patient states their goals for this hospitalization and ongoing recovery are:: to return to home          Expected Discharge Plan and Services   Discharge Planning Services: CM Consult Post Acute Care Choice: Home Health Living arrangements for the past 2 months: Apartment                 DME Arranged: N/A DME Agency: NA       HH Arranged: RN, PT, OT HH Agency: Surveyor, mining, Other - See comment Date HH Agency Contacted: 10/20/23 Time HH Agency Contacted: 1516 Representative spoke with at [facility] Agency: Pam with [facility] and Jon with [facility]  Prior Living Arrangements/Services Living arrangements for the past 2 months: Apartment Lives with:: Self Patient language and need for interpreter reviewed:: Yes Do you feel safe going back to the place where you live?: Yes  Need for Family Participation in Patient Care: Yes (Comment) Care giver support system in place?: Yes (comment) Current home services: DME, Home OT, Home PT, Home RN Criminal Activity/Legal Involvement Pertinent to Current Situation/Hospitalization: No - Comment as needed  Activities of Daily Living   ADL Screening (condition at time of admission) Independently performs ADLs?: No Does the patient have a NEW difficulty with bathing/dressing/toileting/self-feeding that is expected to last >3 days?: No Does the patient have a NEW difficulty with getting in/out of bed, walking, or climbing stairs that is expected to last >3 days?: No Does the patient have a NEW difficulty with communication that is expected to  last >3 days?: No Is the patient deaf or have difficulty hearing?: No Does the patient have difficulty seeing, even when wearing glasses/contacts?: No Does the patient have difficulty concentrating, remembering, or making decisions?: No  Permission Sought/Granted Permission sought to share information with : Facility Medical sales representative, Family Supports Permission granted to share information with : Yes, Verbal Permission Granted  Share Information with NAME: [name]  Permission granted to share info w AGENCY: [facility] and Acupuncturist granted to share info w Relationship: niece  Permission granted to share info w Contact Information: [phone]  Emotional Assessment Appearance:: Appears stated age Attitude/Demeanor/Rapport: Engaged Affect (typically observed): Appropriate Orientation: : Oriented to Self, Oriented to Place, Oriented to  Time, Oriented to Situation Alcohol / Substance Use: Not Applicable Psych Involvement: No (comment)  Admission diagnosis:  Wound dehiscence [T81.30XA] Open knee wound, right, sequela [S81.001S] Patient Active Problem List   Diagnosis Date Noted   Open knee wound, right, sequela 10/14/2023   Malnutrition of moderate degree 08/30/2023   Tibia/fibula fracture 08/24/2023   Hypotension 08/24/2023   Hyperkalemia 08/24/2023   Cellulitis of right arm 08/10/2023   Eye pain, bilateral 07/26/2022   Fall 07/26/2022   Hyponatremia 07/26/2022   Lymphopenia 07/26/2022   Shortness of breath 05/21/2022   AKI (acute kidney injury) (HCC) 03/17/2020   COVID-19 virus infection 03/17/2020   COPD with acute exacerbation (HCC) 03/17/2020   Type 2 diabetes mellitus (HCC) 03/17/2020   HTN (hypertension) 03/17/2020   B12 deficiency 10/05/2019   Erectile dysfunction 11/10/2017   Chronic kidney disease (CKD), stage III (moderate) (HCC) 09/19/2017   Mild intermittent asthma without complication 09/19/2017   Chronic cough 10/22/2016   Epigastric pain  06/30/2015   Chronic bilateral low back pain without sciatica 05/31/2015   Neuropathic pain 03/01/2015   Benign prostatic hyperplasia with lower urinary tract symptoms 04/06/2014   Adenomatous colon polyp 08/31/2013   Depression 06/21/2013   Foot pain, bilateral 05/07/2013   Insomnia 03/24/2013   Vitiligo 12/23/2012   Primary localized osteoarthrosis, lower leg 01/15/2011   Pain medication agreement broken 09/19/2010   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 [place] [address] Phone: [phone] Fax: [phone]     Social Drivers of Health (SDOH) Social History: SDOH Screenings   Food Insecurity: No Food Insecurity (10/14/2023)  Housing: Unknown (10/14/2023)  Transportation Needs: No Transportation Needs (10/14/2023)  Utilities: Not At Risk (10/14/2023)  Financial Resource Strain: Medium Risk (05/02/2021)   [facility]  Physical Activity: Inactive (08/25/2018)   [facility] from [facility]  Social Connections: Unknown (10/14/2023)  Stress: No Stress  Concern Present (08/25/2018)   Received from [facility]  Tobacco Use: Medium Risk (10/15/2023)  Health Literacy: High Risk (05/04/2022)   [facility]   SDOH Interventions:     Readmission Risk Interventions    09/01/2023   12:00 PM  Readmission Risk Prevention Plan  Transportation Screening Complete  Medication Review (RN Care Manager) Complete  PCP or Specialist appointment within 3-5 days of discharge Complete  HRI or Home Care Consult Complete  SW Recovery Care/Counseling Consult Complete  Palliative Care Screening Not Applicable  Skilled Nursing Facility Complete

## 2023-10-20 NOTE — Inpatient Diabetes Management (Signed)
 Inpatient Diabetes Program Recommendations  AACE/ADA: New Consensus Statement on Inpatient Glycemic Control   Target Ranges:  Prepandial:   less than 140 mg/dL      Peak postprandial:   less than 180 mg/dL (1-2 hours)      Critically ill patients:  140 - 180 mg/dL    Latest Reference Range & Units 10/19/23 08:29 10/19/23 11:34 10/19/23 16:11 10/19/23 20:05 10/19/23 20:30 10/20/23 07:59  Glucose-Capillary 70 - 99 mg/dL 833 (H) 883 (H) 830 (H) 61 (L) 107 (H) 167 (H)    Review of Glycemic Control  Diabetes history: DM2 Outpatient Diabetes medications: Metformin  500mg  BID Current orders for Inpatient glycemic control: Novolog  0-15 units TID and Novolog  0-5 units at bedtime.   Inpatient Diabetes Program Recommendations: Patient's CBG dropped to 61mg /dl yesterday at 79:94 after receiving Novolog  3 units of correction. Please consider decreasing Novolog  to 0-9 units TID with meals.   Thanks,  Lavanda Search, RN, MSN, [facility]  Inpatient Diabetes Coordinator  Pager [phone] (8a-5p)

## 2023-10-21 DIAGNOSIS — S81001S Unspecified open wound, right knee, sequela: Secondary | ICD-10-CM | POA: Diagnosis not present

## 2023-10-21 LAB — BASIC METABOLIC PANEL WITH GFR
Anion gap: 6 (ref 5–15)
BUN: 26 mg/dL — ABNORMAL HIGH (ref 8–23)
CO2: 24 mmol/L (ref 22–32)
Calcium: 8.4 mg/dL — ABNORMAL LOW (ref 8.9–10.3)
Chloride: 103 mmol/L (ref 98–111)
Creatinine, Ser: 1.45 mg/dL — ABNORMAL HIGH (ref 0.61–1.24)
GFR, Estimated: 49 mL/min — ABNORMAL LOW (ref >60)
Glucose, Bld: 238 mg/dL — ABNORMAL HIGH (ref 70–99)
Potassium: 4.5 mmol/L (ref 3.5–5.1)
Sodium: 133 mmol/L — ABNORMAL LOW (ref 135–145)

## 2023-10-21 LAB — CBC
HCT: 28.5 % — ABNORMAL LOW (ref 39.0–52.0)
Hemoglobin: 8.7 g/dL — ABNORMAL LOW (ref 13.0–17.0)
MCH: 27.7 pg (ref 26.0–34.0)
MCHC: 30.5 g/dL (ref 30.0–36.0)
MCV: 90.8 fL (ref 80.0–100.0)
Platelets: 185 K/uL (ref 150–400)
RBC: 3.14 MIL/uL — ABNORMAL LOW (ref 4.22–5.81)
RDW: 15.9 % — ABNORMAL HIGH (ref 11.5–15.5)
WBC: 9.8 K/uL (ref 4.0–10.5)
nRBC: 0 % (ref 0.0–0.2)

## 2023-10-21 LAB — GLUCOSE, CAPILLARY
Glucose-Capillary: 178 mg/dL — ABNORMAL HIGH (ref 70–99)
Glucose-Capillary: 108 mg/dL — ABNORMAL HIGH (ref 70–99)
Glucose-Capillary: 133 mg/dL — ABNORMAL HIGH (ref 70–99)
Glucose-Capillary: 108 mg/dL — ABNORMAL HIGH (ref 70–99)

## 2023-10-21 NOTE — Progress Notes (Signed)
 PROGRESS NOTE    Joe Coleman  FMW:[id number] DOB: Oct 29, 1945 DOA: 10/14/2023 PCP: [facility]   Brief Narrative:  This 78 year old Male with PMH significant for DM2, HTN comes into the hospital with wound dehiscence. He was admitted for leg injury on July 19 with right tibial fracture, underwent immobilization with a long-leg splint with ORIF 08/25/23, discharged 7/28. He was seen in the ED mid August with concern for wound infection and started on doxycycline . Did not really improve and came back to the hospital. Orthopedic surgery consulted and he was taken to the OR on 9/10 /25 for removal of hardware.  The patient has had a PICCline placed and OPAT orders are in place. He will need to have home health set up for him to get his long term IV antibiotics. ID recommends 6 weeks of IV antibiotics. Cultures have grown out enterobacter Cloquel and Klebsiella pneumoniae. He will receive 6 weeks of IV daptomycin  and cefepime .   Assessment & Plan:   Principal Problem:   Open knee wound, right, sequela   Right leg wound / Hardware infection: Patient was admitted to the hospital and placed on IV antibiotics. Orthopedic surgery consulted, he was taken to the OR on 10/15/23 status post hardware removal, I&D right proximal tibia, skin graft, long-leg splint and wound VAC placement. Continue to monitor postoperatively, intraoperative cultures have grown out enterobacter Cloquel and Klebsiella pneumoniae.. Pt is receiving IV cefepime  and Daptomycin . Blood cultures have had no growth to date. Patient has had a PICC line placed and OPAT orders are in place. Patient will need 6 weeks of IV antibiotics(daptomycin  and cefepime ). Patient's niece and friend from [place] will be here tomorrow, needs education about PICC line and IV Antibiotics before discharge.   History of tobacco use : No documentation of COPD but he is on inhalers and home O2. Quit smoking 14 years ago.    Hyperkalemia: -Lokelma  x 1 9/11, potassium now normalized.   Hypomagnesemia: Replaced.  Continue to monitor   Decreased pedal pulses: -ABIs fairly unremarkable, could not be obtained on the right lower extremity.   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   Anemia - of chronic disease: Hemoglobin stable, no bleeding.   DM2 -A1c 7.6 which is appropriate in his age group.  Hold metformin . Continue regular insulin  sliding scale.   DVT prophylaxis: SCDs Code Status: Full code Family Communication:  No family at bed side Disposition Plan:  Status is: Inpatient Remains inpatient appropriate because:  Awaiting HHS arrangement, ID clearance.   Consultants:  Orthopedics ID  Procedures: Antimicrobials:  Anti-infectives (From admission, onward)    Start     Dose/Rate Route Frequency Ordered Stop   10/19/23 1715  ceFEPIme  (MAXIPIME ) 2 g in sodium chloride  0.9 % 100 mL IVPB        2 g 200 mL/hr over 30 Minutes Intravenous Every 12 hours 10/19/23 1000     10/17/23 1345  DAPTOmycin  (CUBICIN ) IVPB 500 mg/44mL premix        8 mg/kg  65.8 kg 100 mL/hr over 30 Minutes Intravenous Daily 10/17/23 1257     10/15/23 1715  vancomycin  (VANCOREADY) IVPB 750 mg/150 mL  Status:  Discontinued        750 mg 150 mL/hr over 60 Minutes Intravenous Every 24 hours 10/14/23 1707 10/17/23 1257   10/15/23 0930  vancomycin  (VANCOCIN ) powder  Status:  Discontinued          As needed  10/15/23 0930 10/15/23 1011   10/15/23 0930  tobramycin  (NEBCIN ) powder  Status:  Discontinued          As needed 10/15/23 0932 10/15/23 1011   10/15/23 0600  ceFAZolin  (ANCEF ) IVPB 2g/100 mL premix  Status:  Discontinued        2 g 200 mL/hr over 30 Minutes Intravenous On call to O.R. 10/15/23 0211 10/15/23 1128   10/14/23 1715  vancomycin  (VANCOREADY) IVPB 1250 mg/250 mL        1,250 mg 166.7 mL/hr over 90 Minutes Intravenous  Once 10/14/23 1707 10/14/23 2014   10/14/23  1700  ceFEPIme  (MAXIPIME ) 2 g in sodium chloride  0.9 % 100 mL IVPB  Status:  Discontinued        2 g 200 mL/hr over 30 Minutes Intravenous Every 12 hours 10/14/23 1707 10/19/23 1000   10/14/23 1445  ceFAZolin  (ANCEF ) IVPB 2g/100 mL premix        2 g 200 mL/hr over 30 Minutes Intravenous  Once 10/14/23 1441 10/14/23 1525      Subjective: Patient was seen and examined at bedside. Overnight events noted. Patient reports doing much better , feels improved.   He is awaiting home health services and ID follow-up.  Objective: Vitals:   10/20/23 1554 10/20/23 2012 10/21/23 0800 10/21/23 0816  BP: 128/60 (!) 140/73  125/81  Pulse: 82 88  77  Resp: 16 16  18   Temp: 98.3 F (36.8 C)   97.8 F (36.6 C)  TempSrc: Oral Oral  Oral  SpO2: 99% 91% 100% 100%  Weight:      Height:        Intake/Output Summary (Last 24 hours) at 10/21/2023 1418 Last data filed at 10/21/2023 1200 Gross per 24 hour  Intake 413 ml  Output 1120 ml  Net -707 ml   Filed Weights   10/14/23 1650  Weight: 65.8 kg    Examination:  General exam: Appears calm and comfortable, not in any acute distress. Respiratory system: CTA Bilaterally. Respiratory effort normal.  RR 14 Cardiovascular system: S1 & S2 heard, RRR. No JVD, murmurs, rubs, gallops or clicks.  Gastrointestinal system: Abdomen is non distended, soft and non tender.  Normal bowel sounds heard. Central nervous system: Alert and oriented x 3. No focal neurological deficits. Extremities: Right lower extremity in dressing. Skin: No rashes, lesions or ulcers Psychiatry: Judgement and insight appear normal. Mood & affect appropriate.     Data Reviewed: I have personally reviewed following labs and imaging studies  CBC: Recent Labs  Lab 10/14/23 1445 10/16/23 0328 10/17/23 0335 10/18/23 0232 10/19/23 0442 10/21/23 0351  WBC 8.7 7.4 7.3 7.8 7.6 9.8  NEUTROABS 6.3  --   --   --  4.7  --   HGB 11.3* 9.2* 8.4* 9.3* 8.7* 8.7*  HCT 39.1 29.4* 27.4*  30.4* 28.6* 28.5*  MCV 94.7 87.8 88.4 89.7 89.9 90.8  PLT 247 207 204 214 198 185   Basic Metabolic Panel: Recent Labs  Lab 10/16/23 0328 10/17/23 0335 10/18/23 0232 10/19/23 0442 10/21/23 0351  NA 136 136 135 137 133*  K 5.2* 4.9 4.8 4.8 4.5  CL 103 104 106 104 103  CO2 24 25 22 26 24   GLUCOSE 141* 131* 124* 134* 238*  BUN 20 27* 27* 25* 26*  CREATININE 1.39* 1.64* 1.51* 1.25* 1.45*  CALCIUM 8.6* 8.3* 8.5* 8.5* 8.4*  MG 1.5* 2.0 1.9  --   --    GFR: Estimated Creatinine Clearance: 37.8 mL/min (A) (  by C-G formula based on SCr of 1.45 mg/dL (H)). Liver Function Tests: Recent Labs  Lab 10/14/23 1445 10/16/23 0328  AST 25 14*  ALT 9 6  ALKPHOS 104 67  BILITOT 0.5 0.6  PROT 6.5 5.0*  ALBUMIN  2.9* 2.1*   No results for input(s): LIPASE, AMYLASE in the last 168 hours. No results for input(s): AMMONIA in the last 168 hours. Coagulation Profile: No results for input(s): INR, PROTIME in the last 168 hours. Cardiac Enzymes: Recent Labs  Lab 10/18/23 0232  CKTOTAL 22*   BNP (last 3 results) No results for input(s): PROBNP in the last 8760 hours. HbA1C: No results for input(s): HGBA1C in the last 72 hours. CBG: Recent Labs  Lab 10/20/23 1651 10/20/23 2022 10/20/23 2210 10/21/23 0811 10/21/23 1208  GLUCAP 147* 170* 196* 178* 108*   Lipid Profile: No results for input(s): CHOL, HDL, LDLCALC, TRIG, CHOLHDL, LDLDIRECT in the last 72 hours. Thyroid Function Tests: No results for input(s): TSH, T4TOTAL, FREET4, T3FREE, THYROIDAB in the last 72 hours. Anemia Panel: No results for input(s): VITAMINB12, FOLATE, FERRITIN, TIBC, IRON , RETICCTPCT in the last 72 hours. Sepsis Labs: No results for input(s): PROCALCITON, LATICACIDVEN in the last 168 hours.  Recent Results (from the past 240 hours)  Blood culture (routine x 2)     Status: None   Collection Time: 10/14/23  2:45 PM   Specimen: BLOOD  Result Value Ref Range  Status   Specimen Description BLOOD SITE NOT SPECIFIED  Final   Special Requests   Final    BOTTLES DRAWN AEROBIC AND ANAEROBIC Blood Culture results may not be optimal due to an inadequate volume of blood received in culture bottles   Culture   Final    NO GROWTH 5 DAYS Performed at [facility], [address], [place], [place] [address]    Report Status 10/19/2023 FINAL  Final  Blood culture (routine x 2)     Status: None   Collection Time: 10/14/23  2:45 PM   Specimen: BLOOD  Result Value Ref Range Status   Specimen Description BLOOD SITE NOT SPECIFIED  Final   Special Requests   Final    BOTTLES DRAWN AEROBIC AND ANAEROBIC Blood Culture results may not be optimal due to an inadequate volume of blood received in culture bottles   Culture   Final    NO GROWTH 5 DAYS Performed at [facility], [address], [place], [place] [address]    Report Status 10/19/2023 FINAL  Final  MRSA Next Gen by PCR, Nasal     Status: None   Collection Time: 10/15/23  6:02 AM   Specimen: Nasal Mucosa; Nasal Swab  Result Value Ref Range Status   MRSA by PCR Next Gen NOT DETECTED NOT DETECTED Final    Comment: (NOTE) The GeneXpert MRSA Assay (FDA approved for NASAL specimens only), is one component of a comprehensive MRSA colonization surveillance program. It is not intended to diagnose MRSA infection nor to guide or monitor treatment for MRSA infections. Test performance is not FDA approved in patients less than 47 years old. Performed at [facility], [address] [place], [place] [address]   Aerobic/Anaerobic Culture w Gram Stain (surgical/deep wound)     Status: None   Collection Time: 10/15/23  9:22 AM   Specimen: Leg, Right; Tissue  Result Value Ref Range Status   Specimen Description TISSUE  Final   Special Requests RIGHT TIBIAL INFECTION 1  Final  Gram Stain NO WBC SEEN NO ORGANISMS SEEN   Final   Culture   Final    RARE ENTEROBACTER CLOACAE FEW KLEBSIELLA  PNEUMONIAE NO ANAEROBES ISOLATED Performed at [facility], [address], [place], [place] [address]    Report Status 10/20/2023 FINAL  Final   Organism ID, Bacteria ENTEROBACTER CLOACAE  Final   Organism ID, Bacteria KLEBSIELLA PNEUMONIAE  Final      Susceptibility   Enterobacter cloacae - MIC*    CEFEPIME  0.25 SENSITIVE Sensitive     ERTAPENEM <=0.12 SENSITIVE Sensitive     CIPROFLOXACIN 0.25 SENSITIVE Sensitive     GENTAMICIN <=1 SENSITIVE Sensitive     MEROPENEM <=0.25 SENSITIVE Sensitive     TRIMETH/SULFA <=20 SENSITIVE Sensitive     PIP/TAZO Value in next row Sensitive ug/mL     8 SENSITIVEThis is a modified FDA-approved test that has been validated and its performance characteristics determined by the reporting laboratory.  This laboratory is certified under the Clinical Laboratory Improvement Amendments CLIA as qualified to perform high complexity clinical laboratory testing.    * RARE ENTEROBACTER CLOACAE   Klebsiella pneumoniae - MIC*    AMPICILLIN Value in next row Resistant      8 SENSITIVEThis is a modified FDA-approved test that has been validated and its performance characteristics determined by the reporting laboratory.  This laboratory is certified under the Clinical Laboratory Improvement Amendments CLIA as qualified to perform high complexity clinical laboratory testing.    CEFAZOLIN  (NON-URINE) Value in next row Sensitive      8 SENSITIVEThis is a modified FDA-approved test that has been validated and its performance characteristics determined by the reporting laboratory.  This laboratory is certified under the Clinical Laboratory Improvement Amendments CLIA as qualified to perform high complexity clinical laboratory testing.    CEFEPIME  Value in next row Sensitive      8 SENSITIVEThis is a modified FDA-approved test that has been validated and its performance characteristics determined by the reporting laboratory.  This laboratory is certified under the Clinical  Laboratory Improvement Amendments CLIA as qualified to perform high complexity clinical laboratory testing.    ERTAPENEM Value in next row Sensitive      8 SENSITIVEThis is a modified FDA-approved test that has been validated and its performance characteristics determined by the reporting laboratory.  This laboratory is certified under the Clinical Laboratory Improvement Amendments CLIA as qualified to perform high complexity clinical laboratory testing.    CEFTRIAXONE Value in next row Sensitive      8 SENSITIVEThis is a modified FDA-approved test that has been validated and its performance characteristics determined by the reporting laboratory.  This laboratory is certified under the Clinical Laboratory Improvement Amendments CLIA as qualified to perform high complexity clinical laboratory testing.    CIPROFLOXACIN Value in next row Sensitive      8 SENSITIVEThis is a modified FDA-approved test that has been validated and its performance characteristics determined by the reporting laboratory.  This laboratory is certified under the Clinical Laboratory Improvement Amendments CLIA as qualified to perform high complexity clinical laboratory testing.    GENTAMICIN Value in next row Sensitive      8 SENSITIVEThis is a modified FDA-approved test that has been validated and its performance characteristics determined by the reporting laboratory.  This laboratory is certified under the Clinical Laboratory Improvement Amendments CLIA as qualified to perform high complexity clinical laboratory testing.    MEROPENEM Value in next row Sensitive      8  SENSITIVEThis is a modified FDA-approved test that has been validated and its performance characteristics determined by the reporting laboratory.  This laboratory is certified under the Clinical Laboratory Improvement Amendments CLIA as qualified to perform high complexity clinical laboratory testing.    TRIMETH/SULFA Value in next row Sensitive      8 SENSITIVEThis  is a modified FDA-approved test that has been validated and its performance characteristics determined by the reporting laboratory.  This laboratory is certified under the Clinical Laboratory Improvement Amendments CLIA as qualified to perform high complexity clinical laboratory testing.    AMPICILLIN/SULBACTAM Value in next row Sensitive      8 SENSITIVEThis is a modified FDA-approved test that has been validated and its performance characteristics determined by the reporting laboratory.  This laboratory is certified under the Clinical Laboratory Improvement Amendments CLIA as qualified to perform high complexity clinical laboratory testing.    PIP/TAZO Value in next row Sensitive ug/mL     <=4 SENSITIVEThis is a modified FDA-approved test that has been validated and its performance characteristics determined by the reporting laboratory.  This laboratory is certified under the Clinical Laboratory Improvement Amendments CLIA as qualified to perform high complexity clinical laboratory testing.    * FEW KLEBSIELLA PNEUMONIAE  Aerobic/Anaerobic Culture w Gram Stain (surgical/deep wound)     Status: None   Collection Time: 10/15/23  9:24 AM   Specimen: Leg, Right; Tissue  Result Value Ref Range Status   Specimen Description TISSUE  Final   Special Requests RIGHT TIBIAL INFECTION 2  Final   Gram Stain   Final    NO WBC SEEN RARE GRAM POSITIVE COCCI IN CLUSTERS    Culture   Final    FEW ENTEROBACTER CLOACAE RARE KLEBSIELLA PNEUMONIAE NO ANAEROBES ISOLATED Performed at [facility], [address], [place], [place] [address]    Report Status 10/20/2023 FINAL  Final   Organism ID, Bacteria ENTEROBACTER CLOACAE  Final   Organism ID, Bacteria KLEBSIELLA PNEUMONIAE  Final      Susceptibility   Enterobacter cloacae - MIC*    CEFEPIME  <=0.12 SENSITIVE Sensitive     ERTAPENEM <=0.12 SENSITIVE Sensitive     CIPROFLOXACIN <=0.06 SENSITIVE Sensitive     GENTAMICIN <=1 SENSITIVE Sensitive      MEROPENEM <=0.25 SENSITIVE Sensitive     TRIMETH/SULFA <=20 SENSITIVE Sensitive     PIP/TAZO Value in next row Sensitive ug/mL     <=4 SENSITIVEThis is a modified FDA-approved test that has been validated and its performance characteristics determined by the reporting laboratory.  This laboratory is certified under the Clinical Laboratory Improvement Amendments CLIA as qualified to perform high complexity clinical laboratory testing.    * FEW ENTEROBACTER CLOACAE   Klebsiella pneumoniae - MIC*    AMPICILLIN Value in next row Resistant      <=4 SENSITIVEThis is a modified FDA-approved test that has been validated and its performance characteristics determined by the reporting laboratory.  This laboratory is certified under the Clinical Laboratory Improvement Amendments CLIA as qualified to perform high complexity clinical laboratory testing.    CEFAZOLIN  (NON-URINE) Value in next row Sensitive      <=4 SENSITIVEThis is a modified FDA-approved test that has been validated and its performance characteristics determined by the reporting laboratory.  This laboratory is certified under the Clinical Laboratory Improvement Amendments CLIA as qualified to perform high complexity clinical laboratory testing.    CEFEPIME  Value in next row Sensitive      <=4 SENSITIVEThis is a  modified FDA-approved test that has been validated and its performance characteristics determined by the reporting laboratory.  This laboratory is certified under the Clinical Laboratory Improvement Amendments CLIA as qualified to perform high complexity clinical laboratory testing.    ERTAPENEM Value in next row Sensitive      <=4 SENSITIVEThis is a modified FDA-approved test that has been validated and its performance characteristics determined by the reporting laboratory.  This laboratory is certified under the Clinical Laboratory Improvement Amendments CLIA as qualified to perform high complexity clinical laboratory testing.     CEFTRIAXONE Value in next row Sensitive      <=4 SENSITIVEThis is a modified FDA-approved test that has been validated and its performance characteristics determined by the reporting laboratory.  This laboratory is certified under the Clinical Laboratory Improvement Amendments CLIA as qualified to perform high complexity clinical laboratory testing.    CIPROFLOXACIN Value in next row Sensitive      <=4 SENSITIVEThis is a modified FDA-approved test that has been validated and its performance characteristics determined by the reporting laboratory.  This laboratory is certified under the Clinical Laboratory Improvement Amendments CLIA as qualified to perform high complexity clinical laboratory testing.    GENTAMICIN Value in next row Sensitive      <=4 SENSITIVEThis is a modified FDA-approved test that has been validated and its performance characteristics determined by the reporting laboratory.  This laboratory is certified under the Clinical Laboratory Improvement Amendments CLIA as qualified to perform high complexity clinical laboratory testing.    MEROPENEM Value in next row Sensitive      <=4 SENSITIVEThis is a modified FDA-approved test that has been validated and its performance characteristics determined by the reporting laboratory.  This laboratory is certified under the Clinical Laboratory Improvement Amendments CLIA as qualified to perform high complexity clinical laboratory testing.    TRIMETH/SULFA Value in next row Sensitive      <=4 SENSITIVEThis is a modified FDA-approved test that has been validated and its performance characteristics determined by the reporting laboratory.  This laboratory is certified under the Clinical Laboratory Improvement Amendments CLIA as qualified to perform high complexity clinical laboratory testing.    AMPICILLIN/SULBACTAM Value in next row Sensitive      <=4 SENSITIVEThis is a modified FDA-approved test that has been validated and its performance  characteristics determined by the reporting laboratory.  This laboratory is certified under the Clinical Laboratory Improvement Amendments CLIA as qualified to perform high complexity clinical laboratory testing.    PIP/TAZO Value in next row Sensitive ug/mL     <=4 SENSITIVEThis is a modified FDA-approved test that has been validated and its performance characteristics determined by the reporting laboratory.  This laboratory is certified under the Clinical Laboratory Improvement Amendments CLIA as qualified to perform high complexity clinical laboratory testing.    * RARE KLEBSIELLA PNEUMONIAE    Radiology Studies: No results found.  Scheduled Meds:  acetaminophen   650 mg Oral Q6H   Or   acetaminophen   650 mg Rectal Q6H   aspirin   325 mg Oral Daily   budesonide -glycopyrrolate -formoterol   2 puff Inhalation BID   Chlorhexidine  Gluconate Cloth  6 each Topical Daily   docusate sodium   100 mg Oral BID   gabapentin   300 mg Oral QHS   insulin  aspart  0-5 Units Subcutaneous QHS   insulin  aspart  0-9 Units Subcutaneous TID WC   pantoprazole   40 mg Oral Daily   Continuous Infusions:  ceFEPime  (MAXIPIME ) IV 2 g (10/21/23 0505)   DAPTOmycin   500 mg (10/21/23 1357)     LOS: 7 days    Time spent: 35 mins    Darcel Dawley, MD [facility]   If 7PM-7AM, please contact night-coverage

## 2023-10-21 NOTE — Discharge Instructions (Addendum)
 Orthopaedic Trauma Service Discharge Instructions   General Discharge Instructions  WEIGHT BEARING STATUS:Non-weightbearing right lower extremity  RANGE OF MOTION/ACTIVITY: keep splint in place until follow-up  Wound Care: Keep splint covered and dry. Do not remove  DVT/PE prophylaxis: Aspirin   Diet: as you were eating previously.  Can use over the counter stool softeners and bowel preparations, such as Miralax , to help with bowel movements.  Narcotics can be constipating.  Be sure to drink plenty of fluids  PAIN MEDICATION USE AND EXPECTATIONS  You have likely been given narcotic medications to help control your pain.  After a traumatic event that results in an fracture (broken bone) with or without surgery, it is ok to use narcotic pain medications to help control one's pain.  We understand that everyone responds to pain differently and each individual patient will be evaluated on a regular basis for the continued need for narcotic medications. Ideally, narcotic medication use should last no more than 6-8 weeks (coinciding with fracture healing).   As a patient it is your responsibility as well to monitor narcotic medication use and report the amount and frequency you use these medications when you come to your office visit.   We would also advise that if you are using narcotic medications, you should take a dose prior to therapy to maximize you participation.  IF YOU ARE ON NARCOTIC MEDICATIONS IT IS NOT PERMISSIBLE TO OPERATE A MOTOR VEHICLE (MOTORCYCLE/CAR/TRUCK/MOPED) OR HEAVY MACHINERY DO NOT MIX NARCOTICS WITH OTHER CNS (CENTRAL NERVOUS SYSTEM) DEPRESSANTS SUCH AS ALCOHOL  POST-OPERATIVE OPIOID TAPER INSTRUCTIONS: It is important to wean off of your opioid medication as soon as possible. If you do not need pain medication after your surgery it is ok to stop day one. Opioids include: Codeine , Hydrocodone(Norco, Vicodin), Oxycodone (Percocet, oxycontin ) and hydromorphone  amongst  others.  Long term and even short term use of opiods can cause: Increased pain response Dependence Constipation Depression Respiratory depression And more.  Withdrawal symptoms can include Flu like symptoms Nausea, vomiting And more Techniques to manage these symptoms Hydrate well Eat regular healthy meals Stay active Use relaxation techniques(deep breathing, meditating, yoga) Do Not substitute Alcohol to help with tapering If you have been on opioids for less than two weeks and do not have pain than it is ok to stop all together.  Plan to wean off of opioids This plan should start within one week post op of your fracture surgery  Maintain the same interval or time between taking each dose and first decrease the dose.  Cut the total daily intake of opioids by one tablet each day Next start to increase the time between doses. The last dose that should be eliminated is the evening dose.    STOP SMOKING OR USING NICOTINE  PRODUCTS!!!!  As discussed nicotine  severely impairs your body's ability to heal surgical and traumatic wounds but also impairs bone healing.  Wounds and bone heal by forming microscopic blood vessels (angiogenesis) and nicotine  is a vasoconstrictor (essentially, shrinks blood vessels).  Therefore, if vasoconstriction occurs to these microscopic blood vessels they essentially disappear and are unable to deliver necessary nutrients to the healing tissue.  This is one modifiable factor that you can do to dramatically increase your chances of healing your injury.  (This means no smoking, no nicotine  gum, patches, etc)  DO NOT USE NONSTEROIDAL ANTI-INFLAMMATORY DRUGS (NSAID'S)  Using products such as Advil  (ibuprofen ), Aleve (naproxen), Motrin  (ibuprofen ) for additional pain control during fracture healing can delay and/or prevent the healing response.  If you would like to take over the counter (OTC) medication, Tylenol  (acetaminophen ) is ok.  However, some narcotic  medications that are given for pain control contain acetaminophen  as well. Therefore, you should not exceed more than 4000 mg of tylenol  in a day if you do not have liver disease.  Also note that there are may OTC medicines, such as cold medicines and allergy medicines that my contain tylenol  as well.  If you have any questions about medications and/or interactions please ask your doctor/PA or your pharmacist.      ICE AND ELEVATE INJURED/OPERATIVE EXTREMITY  Using ice and elevating the injured extremity above your heart can help with swelling and pain control.  Icing in a pulsatile fashion, such as 20 minutes on and 20 minutes off, can be followed.    Do not place ice directly on skin. Make sure there is a barrier between to skin and the ice pack.    Using frozen items such as frozen peas works well as the conform nicely to the are that needs to be iced.  USE AN ACE WRAP OR TED HOSE FOR SWELLING CONTROL  In addition to icing and elevation, Ace wraps or TED hose are used to help limit and resolve swelling.  It is recommended to use Ace wraps or TED hose until you are informed to stop.    When using Ace Wraps start the wrapping distally (farthest away from the body) and wrap proximally (closer to the body)   Example: If you had surgery on your leg or thing and you do not have a splint on, start the ace wrap at the toes and work your way up to the thigh        If you had surgery on your upper extremity and do not have a splint on, start the ace wrap at your fingers and work your way up to the upper arm  IF YOU ARE IN A SPLINT OR CAST DO NOT REMOVE IT FOR ANY REASON   If your splint gets wet for any reason please contact the office immediately. You may shower in your splint or cast as long as you keep it dry.  This can be done by wrapping in a cast cover or garbage back (or similar)  Do Not stick any thing down your splint or cast such as pencils, money, or hangers to try and scratch yourself with.  If  you feel itchy take benadryl  as prescribed on the bottle for itching   CALL THE OFFICE FOR MEDICATION REFILLS OR WITH ANY QUESTIONS/CONCERNS: [phone]   VISIT OUR WEBSITE FOR ADDITIONAL INFORMATION: [email or web address]   Discharge Wound Care Instructions  Do NOT apply any ointments, solutions or lotions to pin sites or surgical wounds.  These prevent needed drainage and even though solutions like hydrogen peroxide kill bacteria, they also damage cells lining the pin sites that help fight infection.  Applying lotions or ointments can keep the wounds moist and can cause them to breakdown and open up as well. This can increase the risk for infection. When in doubt call the office.  Surgical incisions should be dressed daily.  If any drainage is noted, use one layer of adaptic or Mepitel, then gauze, Kerlix, and an ace wrap. - These dressing supplies should be available at local medical supply stores ([facility], [facility], etc) as well as Insurance claims handler ([facility], [facility], [place], etc)  Once the incision is completely dry and without drainage, it may be left open to  air out.  Showering may begin 36-48 hours later.  Cleaning gently with soap and water .  Traumatic wounds should be dressed daily as well.    One layer of adaptic, gauze, Kerlix, then ace wrap.  The adaptic can be discontinued once the draining has ceased    If you have a wet to dry dressing: wet the gauze with saline the squeeze as much saline out so the gauze is moist (not soaking wet), place moistened gauze over wound, then place a dry gauze over the moist one, followed by Kerlix wrap, then ace wrap.    Call office for the following: Temperature greater than 101F Persistent nausea and vomiting Severe uncontrolled pain Redness, tenderness, or signs of infection (pain, swelling, redness, odor or green/yellow discharge around the site) Difficulty breathing, headache or visual disturbances Hives Persistent dizziness  or light-headedness Extreme fatigue Any other questions or concerns you may have after discharge  In an emergency, call 911 or go to an Emergency Department at a nearby hospital  OTHER HELPFUL INFORMATION  If you had a block, it will wear off between 8-24 hrs postop typically.  This is period when your pain may go from nearly zero to the pain you would have had postop without the block.  This is an abrupt transition but nothing dangerous is happening.  You may take an extra dose of narcotic when this happens.  You should wean off your narcotic medicines as soon as you are able.  Most patients will be off or using minimal narcotics before their first postop appointment.   We suggest you use the pain medication the first night prior to going to bed, in order to ease any pain when the anesthesia wears off. You should avoid taking pain medications on an empty stomach as it will make you nauseous.  Do not drink alcoholic beverages or take illicit drugs when taking pain medications.  In most states it is against the law to drive while you are in a splint or sling.  And certainly against the law to drive while taking narcotics.  You may return to work/school in the next couple of days when you feel up to it.   Pain medication may make you constipated.  Below are a few solutions to try in this order: Decrease the amount of pain medication if you aren't having pain. Drink lots of decaffeinated fluids. Drink prune juice and/or each dried prunes  If the first 3 don't work start with additional solutions Take Colace - an over-the-counter stool softener Take Senokot - an over-the-counter laxative Take Miralax  - a stronger over-the-counter laxative  Information on my medicine - ELIQUIS  (apixaban )  This medication education was reviewed with me or my healthcare representative as part of my discharge preparation.   Why was Eliquis  prescribed for you? Eliquis  was prescribed to treat blood clots  that may have been found in the veins of your legs (deep vein thrombosis) or in your lungs (pulmonary embolism) and to reduce the risk of them occurring again.  What do You need to know about Eliquis  ? The starting dose is 10 mg (two 5 mg tablets) taken TWICE daily for the FIRST SEVEN (7) DAYS, then on 10/30/2023  the dose is reduced to ONE 5 mg tablet taken TWICE daily.  Eliquis  may be taken with or without food.   Try to take the dose about the same time in the morning and in the evening. If you have difficulty swallowing the tablet whole please discuss  with your pharmacist how to take the medication safely.  Take Eliquis  exactly as prescribed and DO NOT stop taking Eliquis  without talking to the doctor who prescribed the medication.  Stopping may increase your risk of developing a new blood clot.  Refill your prescription before you run out.  After discharge, you should have regular check-up appointments with your healthcare provider that is prescribing your Eliquis .    What do you do if you miss a dose? If a dose of ELIQUIS  is not taken at the scheduled time, take it as soon as possible on the same day and twice-daily administration should be resumed. The dose should not be doubled to make up for a missed dose.  Important Safety Information A possible side effect of Eliquis  is bleeding. You should call your healthcare provider right away if you experience any of the following: Bleeding from an injury or your nose that does not stop. Unusual colored urine (red or dark brown) or unusual colored stools (red or black). Unusual bruising for unknown reasons. A serious fall or if you hit your head (even if there is no bleeding).  Some medicines may interact with Eliquis  and might increase your risk of bleeding or clotting while on Eliquis . To help avoid this, consult your healthcare provider or pharmacist prior to using any new prescription or non-prescription medications, including  herbals, vitamins, non-steroidal anti-inflammatory drugs (NSAIDs) and supplements.  This website has more information on Eliquis  (apixaban ): [email or web address] dena

## 2023-10-21 NOTE — Plan of Care (Signed)
  Problem: Coping: Goal: Ability to adjust to condition or change in health will improve Outcome: Progressing   Problem: Health Behavior/Discharge Planning: Goal: Ability to identify and utilize available resources and services will improve Outcome: Progressing   Problem: Skin Integrity: Goal: Risk for impaired skin integrity will decrease Outcome: Progressing   Problem: Activity: Goal: Risk for activity intolerance will decrease Outcome: Progressing   Problem: Nutrition: Goal: Adequate nutrition will be maintained Outcome: Progressing

## 2023-10-21 NOTE — Plan of Care (Signed)
  Problem: Coping: Goal: Ability to adjust to condition or change in health will improve Outcome: Progressing   Problem: Nutritional: Goal: Maintenance of adequate nutrition will improve Outcome: Progressing Goal: Progress toward achieving an optimal weight will improve Outcome: Progressing   Problem: Education: Goal: Ability to describe self-care measures that may prevent or decrease complications (Diabetes Survival Skills Education) will improve Outcome: Not Progressing   Problem: Education: Goal: Knowledge of General Education information will improve Description: Including pain rating scale, medication(s)/side effects and non-pharmacologic comfort measures Outcome: Not Progressing

## 2023-10-21 NOTE — TOC Progression Note (Signed)
 Transition of Care (TOC) - Progression Note   Home VAC has been approved  Patient Details  Name: Joe Coleman MRN: [id number] Date of Birth: 1945-10-19  Transition of Care [facility]) CM/SW Contact  Ulysee Fyock, Powell Jansky, RN Phone Number: 10/21/2023, 12:00 PM  Clinical Narrative:       Expected Discharge Plan: Home w Home Health Services Barriers to Discharge: Insurance Authorization, Continued Medical Work up, SNF Pending bed offer               Expected Discharge Plan and Services   Discharge Planning Services: CM Consult Post Acute Care Choice: Home Health Living arrangements for the past 2 months: Apartment                 DME Arranged: N/A DME Agency: NA       HH Arranged: RN, PT, OT HH Agency: Surveyor, mining, Other - See comment Date HH Agency Contacted: 10/20/23 Time HH Agency Contacted: [phone] Representative spoke with at [facility] Agency: Pam with [employer] and Jon with [employer]   Social Drivers of Health (SDOH) Interventions SDOH Screenings   Food Insecurity: No Food Insecurity (10/14/2023)  Housing: Unknown (10/14/2023)  Transportation Needs: No Transportation Needs (10/14/2023)  Utilities: Not At Risk (10/14/2023)  Financial Resource Strain: Medium Risk (05/02/2021)   Received from [facility] Care  Physical Activity: Inactive (08/25/2018)   Received from [facility]  Social Connections: Unknown (10/14/2023)  Stress: No Stress Concern Present (08/25/2018)   Received from [facility]  Tobacco Use: Medium Risk (10/15/2023)  Health Literacy: High Risk (05/04/2022)   Received from [facility]    Readmission Risk Interventions    09/01/2023   12:00 PM  Readmission Risk Prevention Plan  Transportation Screening Complete  Medication Review (RN Care Manager) Complete  PCP or Specialist appointment within 3-5 days of discharge Complete  HRI or Home Care Consult Complete  SW Recovery Care/Counseling Consult Complete  Palliative Care Screening Not Applicable  Skilled  Nursing Facility Complete

## 2023-10-22 ENCOUNTER — Inpatient Hospital Stay (HOSPITAL_COMMUNITY)

## 2023-10-22 DIAGNOSIS — E875 Hyperkalemia: Secondary | ICD-10-CM

## 2023-10-22 DIAGNOSIS — D631 Anemia in chronic kidney disease: Secondary | ICD-10-CM

## 2023-10-22 DIAGNOSIS — S81001S Unspecified open wound, right knee, sequela: Secondary | ICD-10-CM | POA: Diagnosis not present

## 2023-10-22 DIAGNOSIS — N1832 Chronic kidney disease, stage 3b: Secondary | ICD-10-CM

## 2023-10-22 DIAGNOSIS — L089 Local infection of the skin and subcutaneous tissue, unspecified: Secondary | ICD-10-CM

## 2023-10-22 DIAGNOSIS — E1122 Type 2 diabetes mellitus with diabetic chronic kidney disease: Secondary | ICD-10-CM

## 2023-10-22 LAB — RETICULOCYTES
Immature Retic Fract: 18.9 % — ABNORMAL HIGH (ref 2.3–15.9)
RBC.: 3.35 MIL/uL — ABNORMAL LOW (ref 4.22–5.81)
Retic Count, Absolute: 60 K/uL (ref 19.0–186.0)
Retic Ct Pct: 1.8 % (ref 0.4–3.1)

## 2023-10-22 LAB — GLUCOSE, CAPILLARY
Glucose-Capillary: 141 mg/dL — ABNORMAL HIGH (ref 70–99)
Glucose-Capillary: 111 mg/dL — ABNORMAL HIGH (ref 70–99)
Glucose-Capillary: 107 mg/dL — ABNORMAL HIGH (ref 70–99)
Glucose-Capillary: 166 mg/dL — ABNORMAL HIGH (ref 70–99)
Glucose-Capillary: 123 mg/dL — ABNORMAL HIGH (ref 70–99)

## 2023-10-22 LAB — BASIC METABOLIC PANEL WITH GFR
Anion gap: 6 (ref 5–15)
BUN: 29 mg/dL — ABNORMAL HIGH (ref 8–23)
CO2: 25 mmol/L (ref 22–32)
Calcium: 8.6 mg/dL — ABNORMAL LOW (ref 8.9–10.3)
Chloride: 104 mmol/L (ref 98–111)
Creatinine, Ser: 1.38 mg/dL — ABNORMAL HIGH (ref 0.61–1.24)
GFR, Estimated: 52 mL/min — ABNORMAL LOW (ref >60)
Glucose, Bld: 121 mg/dL — ABNORMAL HIGH (ref 70–99)
Potassium: 5 mmol/L (ref 3.5–5.1)
Sodium: 135 mmol/L (ref 135–145)

## 2023-10-22 LAB — TROPONIN I (HIGH SENSITIVITY)
Troponin I (High Sensitivity): 7 ng/L (ref <18)
Troponin I (High Sensitivity): 6 ng/L (ref <18)

## 2023-10-22 LAB — CBC
HCT: 26.9 % — ABNORMAL LOW (ref 39.0–52.0)
HCT: 32.6 % — ABNORMAL LOW (ref 39.0–52.0)
Hemoglobin: 8.1 g/dL — ABNORMAL LOW (ref 13.0–17.0)
Hemoglobin: 9.9 g/dL — ABNORMAL LOW (ref 13.0–17.0)
MCH: 27.4 pg (ref 26.0–34.0)
MCH: 27.5 pg (ref 26.0–34.0)
MCHC: 30.1 g/dL (ref 30.0–36.0)
MCHC: 30.4 g/dL (ref 30.0–36.0)
MCV: 90.9 fL (ref 80.0–100.0)
MCV: 90.6 fL (ref 80.0–100.0)
Platelets: 204 K/uL (ref 150–400)
Platelets: 262 K/uL (ref 150–400)
RBC: 2.96 MIL/uL — ABNORMAL LOW (ref 4.22–5.81)
RBC: 3.6 MIL/uL — ABNORMAL LOW (ref 4.22–5.81)
RDW: 16.1 % — ABNORMAL HIGH (ref 11.5–15.5)
RDW: 15.9 % — ABNORMAL HIGH (ref 11.5–15.5)
WBC: 9.7 K/uL (ref 4.0–10.5)
WBC: 10.3 K/uL (ref 4.0–10.5)
nRBC: 0 % (ref 0.0–0.2)
nRBC: 0 % (ref 0.0–0.2)

## 2023-10-22 LAB — TYPE AND SCREEN
ABO/RH(D): O POS
Antibody Screen: NEGATIVE

## 2023-10-22 LAB — IRON AND TIBC
Iron: 37 ug/dL — ABNORMAL LOW (ref 45–182)
Saturation Ratios: 15 % — ABNORMAL LOW (ref 17.9–39.5)
TIBC: 253 ug/dL (ref 250–450)
UIBC: 216 ug/dL

## 2023-10-22 LAB — D-DIMER, QUANTITATIVE: D-Dimer, Quant: 2.51 ug{FEU}/mL — ABNORMAL HIGH (ref 0.00–0.50)

## 2023-10-22 LAB — VITAMIN B12: Vitamin B-12: 153 pg/mL — ABNORMAL LOW (ref 180–914)

## 2023-10-22 LAB — PHOSPHORUS: Phosphorus: 3.3 mg/dL (ref 2.5–4.6)

## 2023-10-22 LAB — MAGNESIUM: Magnesium: 1.9 mg/dL (ref 1.7–2.4)

## 2023-10-22 LAB — FIBRINOGEN: Fibrinogen: 539 mg/dL — ABNORMAL HIGH (ref 210–475)

## 2023-10-22 LAB — FERRITIN: Ferritin: 155 ng/mL (ref 24–336)

## 2023-10-22 LAB — FOLATE: Folate: 12.4 ng/mL (ref >5.9)

## 2023-10-22 MED ORDER — CYANOCOBALAMIN 1000 MCG/ML IJ SOLN
1000.0000 ug | Freq: Once | INTRAMUSCULAR | Status: AC
  Administered 2023-10-22: 1000 ug via SUBCUTANEOUS
  Filled 2023-10-22: qty 1

## 2023-10-22 MED ORDER — FERROUS SULFATE 325 (65 FE) MG PO TABS
325.0000 mg | ORAL_TABLET | Freq: Every day | ORAL | Status: DC
  Administered 2023-10-23 – 2023-10-24 (×2): 325 mg via ORAL
  Filled 2023-10-22 (×2): qty 1

## 2023-10-22 NOTE — Plan of Care (Signed)
  Problem: Coping: Goal: Ability to adjust to condition or change in health will improve Outcome: Progressing   Problem: Health Behavior/Discharge Planning: Goal: Ability to manage health-related needs will improve Outcome: Progressing   Problem: Metabolic: Goal: Ability to maintain appropriate glucose levels will improve Outcome: Progressing

## 2023-10-22 NOTE — Progress Notes (Signed)
 [facility] Progress Note Patient: Joe Coleman FMW:[id number] DOB: 08/29/1945  DOA: 10/14/2023 DOS: the patient was seen and examined on 10/22/2023  Brief Hospital Course: This 78 year old Male with PMH significant for DM2, HTN comes into the hospital with wound dehiscence. He was admitted for leg injury on July 19 with right tibial fracture, underwent immobilization with a long-leg splint with ORIF 08/25/23, discharged 7/28. He was seen in the ED mid August with concern for wound infection and started on doxycycline . Did not really improve and came back to the hospital. Orthopedic surgery consulted and he was taken to the OR on 9/10 /25 for removal of hardware.  The patient has had a PICCline placed and OPAT orders are in place. He will need to have home health set up for him to get his long term IV antibiotics. ID recommends 6 weeks of IV antibiotics. Cultures have grown out enterobacter Cloquel and Klebsiella pneumoniae. He will receive 6 weeks of IV daptomycin  and cefepime .   Assessment and Plan: Right leg wound / Hardware infection: Patient was admitted to the hospital and placed on IV antibiotics. Orthopedic surgery consulted, he was taken to the OR on 10/15/23 status post hardware removal, I&D right proximal tibia, skin graft, long-leg splint and wound VAC placement. Continue to monitor postoperatively, intraoperative cultures have grown out enterobacter Cloquel and Klebsiella pneumoniae.. Pt is receiving IV cefepime  and Daptomycin . Blood cultures have had no growth to date. Patient has had a PICC line placed and OPAT orders are in place. Patient will need 6 weeks of IV antibiotics(daptomycin  and cefepime ). Patient's niece and friend from [place] will be here tomorrow, needs education about PICC line and IV Antibiotics before discharge.   History of tobacco use : No documentation of COPD but he is on inhalers and home O2. Quit smoking 14 years ago.   Hyperkalemia: -Lokelma  x 1 9/11,  potassium now normalized.   Hypomagnesemia: Replaced.  Continue to monitor   Decreased pedal pulses: -ABIs fairly unremarkable, could not be obtained on the right lower extremity.   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   Anemia - of chronic disease: Trending down. Will recheck.  Monitor.   DM2 -A1c 7.6 which is appropriate in his age group.  Hold metformin . Continue regular insulin  sliding scale.  Chest pain. Patient reported chest pain on the left side. Serial troponins are negative. D-dimer mildly elevated but no hypoxia.  EKG also does not show any evidence of significant PE. Will check lower extremity Doppler.   Subjective: Patient reported musculoskeletal chest pain this morning.  Also having some shortness of breath.  No nausea no vomiting no fever no chills.  At the time of my evaluation he was chest pain-free.  Physical Exam: Left basilar crackles. S1-S2 present. Bowel sound present Trace edema.  Data Reviewed: I have Reviewed nursing notes, Vitals, and Lab results. Since last encounter, pertinent lab results CBC and BMP   . I have ordered test including CBC and BMP troponin  . I have ordered imaging lower extremity Doppler and chest x-ray  . I have independently visualized and interpreted imaging chest x-ray which showed basilar atelectasis on left  .  Disposition: Status is: Inpatient Remains inpatient appropriate because: Monitor overnight for stability of hemoglobin  SCDs Start: 10/15/23 1137  Family Communication: No one at bedside Level of care: Telemetry Medical   Vitals:   10/22/23 0400 10/22/23 0754 10/22/23 1038 10/22/23 1805  BP: 139/75 133/62 132/67  123/77  Pulse: 71 80 77 79  Resp: 18 18 17 18   Temp: 98.1 F (36.7 C) 97.9 F (36.6 C) 97.8 F (36.6 C) 97.6 F (36.4 C)  TempSrc: Oral Oral Oral Oral  SpO2: 98% 100% 99% 97%  Weight:      Height:         Author: Yetta Blanch,  MD 10/22/2023 7:17 PM  Please look on [email or web address] to find out who is on call.

## 2023-10-22 NOTE — TOC Progression Note (Addendum)
 Transition of Care (TOC) - Progression Note   Home VAC approved   Went to take home VAC to patient. Patient prefers his niece signs for VAC. NCM will deliver home VAC once niece is present to sign proof of delivery   Pam with [employer] will provide Rosalynn and Jamie IV ABX education this afternoon ( Jamie's requested time)  between 2 pm and 3 pm . Patient also has IV ABX due today at 1400 and 1700.   Patient will need ambulance transport home   Patient has home oxygen already   Home VAC at bedside. Warren present and signed proof of delivery. Patient plans to go home by ambulance tomorrow. [facility] will not transport supplies. Asked Jamie to leave the black case containing the home VAC pump at hospital but to take the two boxes of dressing supplies home with her. She voiced understanding. Nel still in [place]. Pam here to teach Jamie and patient IV ABX for home.   Patient Details  Name: Joe Coleman MRN: [id number] Date of Birth: [birth year]  Transition of Care [facility]) CM/SW Contact  Xylina Rhoads, Powell Jansky, RN Phone Number: 10/22/2023, 8:41 AM  Clinical Narrative:       Expected Discharge Plan: Home w Home Health Services Barriers to Discharge: Insurance Authorization, Continued Medical Work up, SNF Pending bed offer               Expected Discharge Plan and Services   Discharge Planning Services: CM Consult Post Acute Care Choice: Home Health Living arrangements for the past 2 months: Apartment                 DME Arranged: N/A DME Agency: NA       HH Arranged: RN, PT, OT HH Agency: Surveyor, mining, Other - See comment Date HH Agency Contacted: 10/20/23 Time HH Agency Contacted: [phone] Representative spoke with at [facility] Agency: Pam with [employer] and Jon with [employer]   Social Drivers of Health (SDOH) Interventions SDOH Screenings   Food Insecurity: No Food Insecurity (10/14/2023)  Housing: Unknown (10/14/2023)  Transportation Needs: No Transportation Needs (10/14/2023)  Utilities: Not At  Risk (10/14/2023)  Financial Resource Strain: Medium Risk (05/02/2021)   [facility] Care  Physical Activity: Inactive (08/25/2018)   Received from [facility]  Social Connections: Unknown (10/14/2023)  Stress: No Stress Concern Present (08/25/2018)   Received from [facility]  Tobacco Use: Medium Risk (10/15/2023)  Health Literacy: High Risk (05/04/2022)   Received from [facility]    Readmission Risk Interventions    09/01/2023   12:00 PM  Readmission Risk Prevention Plan  Transportation Screening Complete  Medication Review (RN Care Manager) Complete  PCP or Specialist appointment within 3-5 days of discharge Complete  HRI or Home Care Consult Complete  SW Recovery Care/Counseling Consult Complete  Palliative Care Screening Not Applicable  Skilled Nursing Facility Complete

## 2023-10-22 NOTE — Hospital Course (Signed)
 This 78 year old Male with PMH significant for DM2, HTN comes into the hospital with wound dehiscence. He was admitted for leg injury on July 19 with right tibial fracture, underwent immobilization with a long-leg splint with ORIF 08/25/23, discharged 7/28. He was seen in the ED mid August with concern for wound infection and started on doxycycline . Did not really improve and came back to the hospital. Orthopedic surgery consulted and he was taken to the OR on 9/10 /25 for removal of hardware.  The patient has had a PICCline placed and OPAT orders are in place. He will need to have home health set up for him to get his long term IV antibiotics. ID recommends 6 weeks of IV antibiotics. Cultures have grown out enterobacter Cloquel and Klebsiella pneumoniae. He will receive 6 weeks of IV daptomycin  and cefepime .   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 Orthopedic surgery consulted, he was taken to the OR on 10/15/23 status post hardware removal, I&D right proximal tibia, skin graft, long-leg splint and wound VAC placement. Continue to monitor postoperatively, intraoperative cultures have grown out enterobacter Cloquel and Klebsiella pneumoniae.. Pt is receiving IV cefepime  and Daptomycin . Blood cultures have had no growth to date. Patient has had a PICC line placed and OPAT orders are in place. Patient will need 6 weeks of IV antibiotics(daptomycin  and cefepime ). Patient's niece and friend from [place] will be here tomorrow, needs education about PICC line and IV Antibiotics before discharge.   History of tobacco use : No documentation of COPD but he is on inhalers and home O2. Quit smoking 14 years ago.   Hyperkalemia: -Lokelma  x 1 9/11, potassium now normalized.   Hypomagnesemia: Replaced.  Continue to monitor   Decreased pedal pulses: -ABIs fairly unremarkable, could not be  obtained on the right lower extremity.   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   Anemia - of chronic disease: Trending down.   DM2 -A1c 7.6 which is appropriate in his age group.  Hold metformin . Continue regular insulin  sliding scale.  Chest pain. Patient reported chest pain on the left side. Serial troponins are negative.  Acute left leg distal DVT. D-dimer mildly elevated but no hypoxia. EKG also does not show any evidence of significant PE. Chest pain resolved. Lower extremity Doppler is positive for distal DVT. Given his recent tibia-fibula fracture as well as for infected hardware and limited mobility patient remains at a high risk for further clot propagation. Therefore not a good candidate for observation with serial ultrasound. Will initiate anticoagulation. Concerned that the patient has chronic anemia with recent postop acute blood loss anemia requiring blood transfusion. Will monitor overnight for stability of hemoglobin.

## 2023-10-22 NOTE — Progress Notes (Signed)
 Occupational Therapy Treatment Patient Details Name: Joe Coleman MRN: [id number] DOB: May 25, 1945 Today's Date: 10/22/2023   History of present illness Pt is 78 yo male who presents on 10/14/23 with surgical wound dehiscence R knee with surgical hardware visible. Pt had R tib fx with ORIF 7/25 with wound infection noted 8/28. On 9/10 pt underwent removal of hardware, I&D, skin graft, and wound vac placement. PMH: DM2, COPD, HTN, past tobacco abuse   OT comments  Reinforced NWB on R foot. Pt stating, It has a hard bottom on it, I can put weight on that. Educated pt to bring his [facility] outside of his inaccessible bathroom, pt insisting he will be going into the bathroom for toileting. Pt able to stand statically with min assist and RW. Demonstrated ability to perform L LE dressing using figure 4 method. Educated in importance of eating protein and keeping himself and living environment clean for healing. Pt will have a family member from [place] staying with him indefinitely and has personal care services. His niece will continue to manage his medication. Recommending HHOT.       If plan is discharge home, recommend the following:  Assistance with cooking/housework;Assist for transportation;Help with stairs or ramp for entrance;A little help with walking and/or transfers;A little help with bathing/dressing/bathroom;Direct supervision/assist for medications management;Direct supervision/assist for financial management   Equipment Recommendations  None recommended by OT    Recommendations for Other Services      Precautions / Restrictions Precautions Precautions: Fall Recall of Precautions/Restrictions: Impaired Required Braces or Orthoses: Splint/Cast Splint/Cast: RLE long leg splint (prevents knee flexion) Splint/Cast - Date Prophylactic Dressing Applied (if applicable): 10/15/23 Restrictions Weight Bearing Restrictions Per Provider Order: Yes RLE Weight Bearing Per Provider Order: Non weight  bearing       Mobility Bed Mobility               General bed mobility comments: in chair    Transfers Overall transfer level: Needs assistance Equipment used: Rolling walker (2 wheels) Transfers: Sit to/from Stand Sit to Stand: Min assist           General transfer comment: cues for technique, able to maintain NWB in static standing for pressure relief, placed pillow in bottom of chair     Balance Overall balance assessment: Needs assistance Sitting-balance support: No upper extremity supported, Feet supported Sitting balance-Leahy Scale: Good     Standing balance support: Bilateral upper extremity supported Standing balance-Leahy Scale: Poor                             ADL either performed or assessed with clinical judgement   ADL Overall ADL's : Needs assistance/impaired     Grooming: Brushing hair;Moderate assistance;Sitting Grooming Details (indicate cue type and reason): assist to get tangles out of backside of head             Lower Body Dressing: Set up;Sitting/lateral leans Lower Body Dressing Details (indicate cue type and reason): donned and doffed L sock using figure 4 method                    Extremity/Trunk Assessment              Vision       Perception     Praxis     Communication Communication Communication: No apparent difficulties   Cognition Arousal: Alert Behavior During Therapy: Flat affect Cognition: No family/caregiver present to determine baseline,  Cognition impaired     Awareness: Intellectual awareness intact, Online awareness impaired     Executive functioning impairment (select all impairments): Sequencing, Problem solving, Reasoning OT - Cognition Comments: pt not amenable to bringing BSC out of bathroom at home, will likely be weight bearing to get from his w/c to toilet as w/c does not fit through bathroom door                 Following commands: Intact        Cueing    Cueing Techniques: Verbal cues  Exercises      Shoulder Instructions       General Comments SpO2 98% on 2L, HR 84    Pertinent Vitals/ Pain       Pain Assessment Pain Assessment: Faces Faces Pain Scale: Hurts a little bit Pain Location: RLE Pain Descriptors / Indicators: Guarding, Discomfort Pain Intervention(s): Monitored during session, Repositioned, Premedicated before session  Home Living                                          Prior Functioning/Environment              Frequency  Min 2X/week        Progress Toward Goals  OT Goals(current goals can now be found in the care plan section)  Progress towards OT goals: Progressing toward goals  Acute Rehab OT Goals OT Goal Formulation: With patient Time For Goal Achievement: 10/30/23 Potential to Achieve Goals: Good  Plan      Co-evaluation                 AM-PAC OT 6 Clicks Daily Activity     Outcome Measure   Help from another person eating meals?: None Help from another person taking care of personal grooming?: A Lot Help from another person toileting, which includes using toliet, bedpan, or urinal?: A Lot Help from another person bathing (including washing, rinsing, drying)?: A Little Help from another person to put on and taking off regular upper body clothing?: A Little Help from another person to put on and taking off regular lower body clothing?: A Little 6 Click Score: 17    End of Session Equipment Utilized During Treatment: Gait belt;Rolling walker (2 wheels)  OT Visit Diagnosis: Unsteadiness on feet (R26.81);Other abnormalities of gait and mobility (R26.89);Pain   Activity Tolerance Patient tolerated treatment well   Patient Left in chair;with call bell/phone within reach;with chair alarm set;with nursing/sitter in room   Nurse Communication          Time: 8853-8787 OT Time Calculation (min): 26 min  Charges: OT General Charges $OT Visit: 1 Visit OT  Treatments $Self Care/Home Management : 23-37 mins  Joe Coleman, OTR/L Acute Rehabilitation Services Office: [phone]   Joe Coleman 10/22/2023, 12:49 PM

## 2023-10-22 NOTE — Plan of Care (Signed)
  Problem: Health Behavior/Discharge Planning: Goal: Ability to identify and utilize available resources and services will improve Outcome: Progressing   Problem: Health Behavior/Discharge Planning: Goal: Ability to manage health-related needs will improve Outcome: Progressing

## 2023-10-22 NOTE — Progress Notes (Signed)
 Patient voiced he is not feeling well. This nurse asked the patient what symptoms is he having, patient stated I don't really have any symptoms I just feel like I am going to die Today. Upon further assessment of patient's concerns, patient stated  I feel like I can't catch my breath and something is wrong with my heart. During assessment, patient's lungs sound clear bilaterally, breathing pattern normal with no use of accessory muscles, O2 at 100% with 2L of oxygen via nasal cannula. MD notified and new orders placed. EKG completed, MD notified and placed in chart. CBG 111, full set of vitals completed and are within range. While providing care, patient stated I feel okay now, I think it just comes and goes. Patient denies he is in any pain and is currently comfortable. Patient sitting in chair with alarm on, floor mats down, and call light within reach. Spiritual consult placed.

## 2023-10-22 NOTE — Progress Notes (Signed)
 Physical Therapy Treatment Patient Details Name: Joe Coleman MRN: [id number] DOB: 28-Jan-1946 Today's Date: 10/22/2023   History of Present Illness Pt is 78 yo male who presents on 10/14/23 with surgical wound dehiscence R knee with surgical hardware visible. Pt had R tib fx with ORIF 7/25 with wound infection noted 8/28. On 9/10 pt underwent removal of hardware, I&D, skin graft, and wound vac placement. PMH: DM2, COPD, HTN, past tobacco abuse    PT Comments  Pt in somber mood on arrival. He states I just don't feel right and I think I may die today. Pt agreeable to chaplain visit. Messaged RN to initiate chaplain referral. Pt reporting he feels like he can't catch his breath. He has 2L O2 via [place] donned. SpO2 98%. HR 84. Encouraged pt to get OOB to see if a change in position helped him feel better. Pt agreeable. He required supervision bed mobility and CGA SPT into drop-arm recliner. Pt in recliner with feet elevated at end of session.     If plan is discharge home, recommend the following: A lot of help with walking and/or transfers;A little help with bathing/dressing/bathroom;Assistance with cooking/housework;Assist for transportation   Can travel by private vehicle     No  Equipment Recommendations  None recommended by PT    Recommendations for Other Services       Precautions / Restrictions Precautions Precautions: Fall Recall of Precautions/Restrictions: Intact Precaution/Restrictions Comments: after initial surgery, fell, causing wound that has now dehisced Required Braces or Orthoses: Splint/Cast Splint/Cast: RLE long leg splint (prevents knee flexion) Splint/Cast - Date Prophylactic Dressing Applied (if applicable): 10/15/23 Restrictions RLE Weight Bearing Per Provider Order: Non weight bearing     Mobility  Bed Mobility Overal bed mobility: Needs Assistance Bed Mobility: Supine to Sit     Supine to sit: Supervision, HOB elevated, Used rails     General bed  mobility comments: supervision for safety and lines    Transfers Overall transfer level: Needs assistance Equipment used: None Transfers: Bed to chair/wheelchair/BSC       Squat pivot transfers: Contact guard assist     General transfer comment: squat pivot transfer toward the L into drop arm recliner    Ambulation/Gait               General Gait Details: unable to progress due to difficulty with NWB RLE   Stairs             Wheelchair Mobility     Tilt Bed    Modified Rankin (Stroke Patients Only)       Balance Overall balance assessment: Needs assistance Sitting-balance support: No upper extremity supported, Feet supported Sitting balance-Leahy Scale: Good     Standing balance support: Bilateral upper extremity supported, During functional activity Standing balance-Leahy Scale: Poor                              Communication Communication Communication: No apparent difficulties  Cognition Arousal: Alert Behavior During Therapy: Flat affect   PT - Cognitive impairments: Safety/Judgement, Problem solving                         Following commands: Intact      Cueing Cueing Techniques: Verbal cues, Tactile cues  Exercises      General Comments General comments (skin integrity, edema, etc.): SpO2 98% on 2L, HR 84      Pertinent Vitals/Pain Pain  Assessment Pain Assessment: Faces Faces Pain Scale: Hurts a little bit Pain Location: RLE Pain Descriptors / Indicators: Guarding, Discomfort Pain Intervention(s): Monitored during session, Repositioned    Home Living                          Prior Function            PT Goals (current goals can now be found in the care plan section) Acute Rehab PT Goals Patient Stated Goal: home Progress towards PT goals: Progressing toward goals    Frequency    Min 2X/week      PT Plan      Co-evaluation              AM-PAC PT 6 Clicks Mobility    Outcome Measure  Help needed turning from your back to your side while in a flat bed without using bedrails?: A Little Help needed moving from lying on your back to sitting on the side of a flat bed without using bedrails?: A Little Help needed moving to and from a bed to a chair (including a wheelchair)?: A Little Help needed standing up from a chair using your arms (e.g., wheelchair or bedside chair)?: A Lot Help needed to walk in hospital room?: Total Help needed climbing 3-5 steps with a railing? : Total 6 Click Score: 13    End of Session Equipment Utilized During Treatment: Oxygen Activity Tolerance: Patient tolerated treatment well Patient left: in chair;with call bell/phone within reach;with chair alarm set Nurse Communication: Mobility status PT Visit Diagnosis: Unsteadiness on feet (R26.81);History of falling (Z91.81);Difficulty in walking, not elsewhere classified (R26.2);Pain Pain - Right/Left: Right Pain - part of body: Leg     Time: 0951-1008 PT Time Calculation (min) (ACUTE ONLY): 17 min  Charges:    $Therapeutic Activity: 8-22 mins PT General Charges $$ ACUTE PT VISIT: 1 Visit                     Sari MATSU., PT  Office # [phone]    Erven Sari Shaker 10/22/2023, 10:35 AM

## 2023-10-23 ENCOUNTER — Other Ambulatory Visit (HOSPITAL_COMMUNITY): Payer: Self-pay

## 2023-10-23 ENCOUNTER — Inpatient Hospital Stay (HOSPITAL_COMMUNITY)

## 2023-10-23 ENCOUNTER — Telehealth (HOSPITAL_COMMUNITY): Payer: Self-pay | Admitting: Pharmacy Technician

## 2023-10-23 DIAGNOSIS — S81001S Unspecified open wound, right knee, sequela: Secondary | ICD-10-CM

## 2023-10-23 DIAGNOSIS — R609 Edema, unspecified: Secondary | ICD-10-CM

## 2023-10-23 LAB — BASIC METABOLIC PANEL WITH GFR
Anion gap: 11 (ref 5–15)
BUN: 30 mg/dL — ABNORMAL HIGH (ref 8–23)
CO2: 25 mmol/L (ref 22–32)
Calcium: 9 mg/dL (ref 8.9–10.3)
Chloride: 101 mmol/L (ref 98–111)
Creatinine, Ser: 1.42 mg/dL — ABNORMAL HIGH (ref 0.61–1.24)
GFR, Estimated: 50 mL/min — ABNORMAL LOW (ref >60)
Glucose, Bld: 117 mg/dL — ABNORMAL HIGH (ref 70–99)
Potassium: 4.7 mmol/L (ref 3.5–5.1)
Sodium: 137 mmol/L (ref 135–145)

## 2023-10-23 LAB — GLUCOSE, CAPILLARY
Glucose-Capillary: 111 mg/dL — ABNORMAL HIGH (ref 70–99)
Glucose-Capillary: 127 mg/dL — ABNORMAL HIGH (ref 70–99)
Glucose-Capillary: 131 mg/dL — ABNORMAL HIGH (ref 70–99)
Glucose-Capillary: 168 mg/dL — ABNORMAL HIGH (ref 70–99)

## 2023-10-23 LAB — CBC
HCT: 27.8 % — ABNORMAL LOW (ref 39.0–52.0)
Hemoglobin: 8.3 g/dL — ABNORMAL LOW (ref 13.0–17.0)
MCH: 27.3 pg (ref 26.0–34.0)
MCHC: 29.9 g/dL — ABNORMAL LOW (ref 30.0–36.0)
MCV: 91.4 fL (ref 80.0–100.0)
Platelets: 225 K/uL (ref 150–400)
RBC: 3.04 MIL/uL — ABNORMAL LOW (ref 4.22–5.81)
RDW: 15.9 % — ABNORMAL HIGH (ref 11.5–15.5)
WBC: 8.6 K/uL (ref 4.0–10.5)
nRBC: 0 % (ref 0.0–0.2)

## 2023-10-23 MED ORDER — APIXABAN 5 MG PO TABS
5.0000 mg | ORAL_TABLET | Freq: Two times a day (BID) | ORAL | Status: DC
Start: 2023-10-30 — End: 2023-10-24

## 2023-10-23 MED ORDER — APIXABAN 5 MG PO TABS
10.0000 mg | ORAL_TABLET | Freq: Two times a day (BID) | ORAL | Status: DC
  Administered 2023-10-23 – 2023-10-24 (×3): 10 mg via ORAL
  Filled 2023-10-23 (×3): qty 2

## 2023-10-23 MED ORDER — CYANOCOBALAMIN 1000 MCG/ML IJ SOLN
1000.0000 ug | Freq: Once | INTRAMUSCULAR | Status: AC
  Administered 2023-10-24: 1000 ug via SUBCUTANEOUS
  Filled 2023-10-23 (×2): qty 1

## 2023-10-23 NOTE — Progress Notes (Signed)
 PHARMACY - ANTICOAGULATION CONSULT NOTE  Pharmacy Consult for apixaban  Indication: DVT  Allergies  Allergen Reactions   Losartan Other (See Comments)    Hyperkalemia (>5.5 multiple times on losartan 25mg )   Chicken Allergy Nausea And Vomiting        Poultry Meal Nausea And Vomiting    Patient Measurements: Height: 5' 8 (172.7 cm) Weight: 65.8 kg (145 lb) IBW/kg (Calculated) : 68.4 HEPARIN  DW (KG): 65.8  Vital Signs: Temp: 98.1 F (36.7 C) (09/18 0400) Temp Source: Oral (09/18 0400) BP: 153/69 (09/18 0400) Pulse Rate: 73 (09/18 0400)  Labs: Recent Labs    10/21/23 0351 10/22/23 0213 10/22/23 1441 10/22/23 1442 10/22/23 1957 10/23/23 0311  HGB 8.7* 8.1*  --   --  9.9* 8.3*  HCT 28.5* 26.9*  --   --  32.6* 27.8*  PLT 185 204  --   --  262 225  CREATININE 1.45* 1.38*  --   --   --  1.42*  TROPONINIHS  --   --  7 6  --   --     Estimated Creatinine Clearance: 38.6 mL/min (A) (by C-G formula based on SCr of 1.42 mg/dL (H)).  Assessment: 78 YO male admitted for wound dehiscence following a leg injury 08/23/2023 with R tibial fracture now s/p ORIF. Now found to have an acute deep vein thrombosis involving one of the paired left posterior tibial veins, and both of the left peroneal veins. Pharmacy consulted to initiate apixaban .  No anticoagulation PTA. Patient placed on aspirin  325mg  daily for VTE ppx, now discontinued.  Goal of Therapy:  Monitor platelets by anticoagulation protocol: Yes   Plan:  Start apixaban  10mg  PO BID x7 days followed by apixaban  5 mg  PO BID thereafter Continue to monitor H&H and platelets    Of note, copay $0    Thank you for allowing pharmacy to be a part of this patient's care.  Shelba Collier, PharmD, BCPS Clinical Pharmacist

## 2023-10-23 NOTE — Progress Notes (Signed)
 [facility] Progress Note Patient: Joe Coleman FMW:[id number] DOB: [birth year]  DOA: 10/14/2023 DOS: the patient was seen and examined on 10/23/2023  Brief Hospital Course: This 78 year old Male with PMH significant for DM2, HTN comes into the hospital with wound dehiscence. He was admitted for leg injury on July 19 with right tibial fracture, underwent immobilization with a long-leg splint with ORIF 08/25/23, discharged 7/28. He was seen in the ED mid August with concern for wound infection and started on doxycycline . Did not really improve and came back to the hospital. Orthopedic surgery consulted and he was taken to the OR on 9/10 /25 for removal of hardware.  The patient has had a PICCline placed and OPAT orders are in place. He will need to have home health set up for him to get his long term IV antibiotics. ID recommends 6 weeks of IV antibiotics. Cultures have grown out enterobacter Cloquel and Klebsiella pneumoniae. He will receive 6 weeks of IV daptomycin  and cefepime .   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 Orthopedic surgery consulted, he was taken to the OR on 10/15/23 status post hardware removal, I&D right proximal tibia, skin graft, long-leg splint and wound VAC placement. Continue to monitor postoperatively, intraoperative cultures have grown out enterobacter Cloquel and Klebsiella pneumoniae.. Pt is receiving IV cefepime  and Daptomycin . Blood cultures have had no growth to date. Patient has had a PICC line placed and OPAT orders are in place. Patient will need 6 weeks of IV antibiotics(daptomycin  and cefepime ). Patient's niece and friend from [place] will be here tomorrow, needs education about PICC line and IV Antibiotics before discharge.   History of tobacco use : No documentation of COPD but he is on inhalers and home O2. Quit smoking 14 years ago.    Hyperkalemia: -Lokelma  x 1 9/11, potassium now normalized.   Hypomagnesemia: Replaced.  Continue to monitor   Decreased pedal pulses: -ABIs fairly unremarkable, could not be obtained on the right lower extremity.   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   Anemia - of chronic disease: Trending down.   DM2 -A1c 7.6 which is appropriate in his age group.  Hold metformin . Continue regular insulin  sliding scale.  Chest pain. Patient reported chest pain on the left side. Serial troponins are negative.  Acute left leg distal DVT. D-dimer mildly elevated but no hypoxia. EKG also does not show any evidence of significant PE. Chest pain resolved. Lower extremity Doppler is positive for distal DVT. Given his recent tibia-fibula fracture as well as for infected hardware and limited mobility patient remains at a high risk for further clot propagation. Therefore not a good candidate for observation with serial ultrasound. Will initiate anticoagulation. Concerned that the patient has chronic anemia with recent postop acute blood loss anemia requiring blood transfusion. Will monitor overnight for stability of hemoglobin.   Subjective: Denies any complaint.  No nausea vomiting fever no chills.  Overnight had inappropriate behavior with the staff.  Physical Exam: Airway crackles heard. S1-S2 present Bowel sound present Trace edema bilaterally.  Data Reviewed: I have Reviewed nursing notes, Vitals, and Lab results. Since last encounter, pertinent lab results CBC and BMP   . I have ordered test including CBC and BMP  .   Disposition: Status is: Inpatient Remains inpatient appropriate because: Monitor overnight for hemoglobin stability  SCDs Start: 10/15/23 1137 apixaban  (ELIQUIS )  tablet 10 mg  apixaban  (ELIQUIS ) tablet 5 mg    Family Communication: Discussed with niece on the phone Level of care: Telemetry Medical monitor  overnight Vitals:   10/22/23 1805 10/22/23 1950 10/23/23 0400 10/23/23 1623  BP: 123/77  (!) 153/69 135/60  Pulse: 79  73 84  Resp: 18  17 18   Temp: 97.6 F (36.4 C)  98.1 F (36.7 C) 98.6 F (37 C)  TempSrc: Oral  Oral   SpO2: 97% 96% 98% 100%  Weight:      Height:         Author: Yetta Blanch, MD 10/23/2023 7:13 PM  Please look on [email or web address] to find out who is on call.

## 2023-10-23 NOTE — Progress Notes (Signed)
 Pt made inappropriate comments to nurse on previous night shift as well as this shift. I let him know his comments were unprofessional and would not be tolerated.  Pt had stated Im not ashamed to show myself, look. It used to be a turtle and I couldn't find it, now its a pony and it sticks out. He reported redness and itching, so I provided warm soapy water  basin w washcloths and towels to clean himself, dry himself and powder to use. I set it up to where each time he used urinal he could clean himself easily. Every time I went in patents room from 2200 on the 16th til 0700 on the 17th the patient would uncover himself and state I have to pee first, whether it was med pass, or he called out for something. I would not look at him, instead look at computer or move around rm cleaning to keep from seeing him..he would remind me he wasn't bashful, and said I bet you see a lot of mens privates, I said that was the last thing I was focused on or thinking about while working, we didn't see it as anything out of the norm nor were we focusing on body parts.  When I arrived to my 1900 shiftt on the 17th I was informed the pt refused to go home and that the patient had told his niece ALL about me, what I looked like, my dogs, etc. Before the day nurse left she told me he called her in his room after 1900 and exposed himself telling her how red he was and he was itching, then as her if she would rather be called baby or honey and she replied w her name.  Pt then called me in room (3 times before 745-but when I walked in he immediately pulled the covers back grabbed his penis and began to tell me to look at it, I stopped him and told him I was a professional and he was being inappropriate. I reminded him of the interventions we discussed in depth yesterday and that it would not clear up in 1 day because it took longer than a day to get this way. I then told him what he said to the day shift nurse was inappropriate and  that sort of behavior was not going to be tolerated. Pt said he understood.

## 2023-10-23 NOTE — Progress Notes (Signed)
 Chaplain responded to spiritual care consult. I met with pt Unnamed who shared his thoughts and emotions about this current hospitalization.   We also explored certain family dynamics, especially surrounding his relationship with his sisters. He feels down and uncared for at times, but relies upon his faith in God and his church community that he attends along with his cousin and his cousin's wife. He feels he has a lot of life left in him and is hopeful to be able to get back on his feet. Finally, he welcomed prayer which I happily provided.   Chaplains remain available as further needs arise.

## 2023-10-23 NOTE — Plan of Care (Signed)
  Problem: Nutritional: Goal: Maintenance of adequate nutrition will improve Outcome: Progressing   Problem: Tissue Perfusion: Goal: Adequacy of tissue perfusion will improve Outcome: Progressing   Problem: Clinical Measurements: Goal: Ability to maintain clinical measurements within normal limits will improve Outcome: Progressing Goal: Respiratory complications will improve Outcome: Progressing   Problem: Activity: Goal: Risk for activity intolerance will decrease Outcome: Not Progressing   Problem: Elimination: Goal: Will not experience complications related to urinary retention Outcome: Progressing   Problem: Skin Integrity: Goal: Risk for impaired skin integrity will decrease Outcome: Progressing

## 2023-10-23 NOTE — Telephone Encounter (Signed)
 Pharmacy Patient Advocate Encounter  Insurance verification completed.    The patient is insured through [facility].     Ran test claim for Eliquis  Starter Pack and the current 30 day co-pay is $0.00.  Ran test claim for Xarelto Starter Pack and the current 30 day co-pay is $0.00.   This test claim was processed through [facility]- copay amounts may vary at other pharmacies due to pharmacy/plan contracts, or as the patient moves through the different stages of their insurance plan.

## 2023-10-23 NOTE — TOC Progression Note (Addendum)
 Transition of Care (TOC) - Progression Note   Pam with [employer] working with patient and Jamie again this morning.   Patient will receive today's doses of ABX here at hospital prior to discharge.   Jon with [facility] updated.   Once discharge complete and patient has received today's doses of ABX, NCM will arrange PTAR.   Patient not medically ready for discharge today. Pam with [facility] Jon with [facility] aware    Patient Details  Name: Joe Coleman MRN: [id number] Date of Birth: 1945-10-26  Transition of Care [facility]) CM/SW Contact  Lateef Juncaj, Powell Jansky, RN Phone Number: 10/23/2023, 9:13 AM  Clinical Narrative:       Expected Discharge Plan: Home w Home Health Services Barriers to Discharge: Insurance Authorization, Continued Medical Work up, SNF Pending bed offer               Expected Discharge Plan and Services   Discharge Planning Services: CM Consult Post Acute Care Choice: Home Health Living arrangements for the past 2 months: Apartment                 DME Arranged: N/A DME Agency: NA       HH Arranged: RN, PT, OT HH Agency: Surveyor, mining, Other - See comment Date HH Agency Contacted: 10/20/23 Time HH Agency Contacted: [phone] [facility] Agency: Pam with [facility] and Jon with [employer]   Social Drivers of Health (SDOH) Interventions SDOH Screenings   Food Insecurity: No Food Insecurity (10/14/2023)  Housing: Unknown (10/14/2023)  Transportation Needs: No Transportation Needs (10/14/2023)  Utilities: Not At Risk (10/14/2023)  Financial Resource Strain: Medium Risk (05/02/2021)   Received from [facility]  Physical Activity: Inactive (08/25/2018)   Received from [facility]  Social Connections: Unknown (10/14/2023)  Stress: No Stress Concern Present (08/25/2018)   Received from [facility]  Tobacco Use: Medium Risk (10/15/2023)  Health Literacy: High Risk (05/04/2022)   Received from [facility]     Readmission Risk Interventions    09/01/2023   12:00 PM  Readmission Risk Prevention Plan  Transportation Screening Complete  Medication Review (RN Care Manager) Complete  PCP or Specialist appointment within 3-5 days of discharge Complete  HRI or Home Care Consult Complete  SW Recovery Care/Counseling Consult Complete  Palliative Care Screening Not Applicable  Skilled Nursing Facility Complete

## 2023-10-23 NOTE — Progress Notes (Signed)
 VASCULAR LAB    Bilateral lower extremity venous duplex has been performed.  See CV proc for preliminary results.   Akela Pocius, RVT 10/23/2023, 12:09 PM

## 2023-10-23 NOTE — Plan of Care (Signed)
  Problem: Coping: Goal: Ability to adjust to condition or change in health will improve Outcome: Progressing   Problem: Health Behavior/Discharge Planning: Goal: Ability to manage health-related needs will improve Outcome: Progressing   Problem: Education: Goal: Knowledge of General Education information will improve Description: Including pain rating scale, medication(s)/side effects and non-pharmacologic comfort measures Outcome: Progressing

## 2023-10-24 ENCOUNTER — Other Ambulatory Visit (HOSPITAL_COMMUNITY): Payer: Self-pay

## 2023-10-24 DIAGNOSIS — S81001S Unspecified open wound, right knee, sequela: Secondary | ICD-10-CM | POA: Diagnosis not present

## 2023-10-24 LAB — CBC
HCT: 28.7 % — ABNORMAL LOW (ref 39.0–52.0)
Hemoglobin: 8.7 g/dL — ABNORMAL LOW (ref 13.0–17.0)
MCH: 27.4 pg (ref 26.0–34.0)
MCHC: 30.3 g/dL (ref 30.0–36.0)
MCV: 90.5 fL (ref 80.0–100.0)
Platelets: 246 K/uL (ref 150–400)
RBC: 3.17 MIL/uL — ABNORMAL LOW (ref 4.22–5.81)
RDW: 15.9 % — ABNORMAL HIGH (ref 11.5–15.5)
WBC: 9.3 K/uL (ref 4.0–10.5)
nRBC: 0 % (ref 0.0–0.2)

## 2023-10-24 LAB — BASIC METABOLIC PANEL WITH GFR
Anion gap: 6 (ref 5–15)
BUN: 31 mg/dL — ABNORMAL HIGH (ref 8–23)
CO2: 25 mmol/L (ref 22–32)
Calcium: 9 mg/dL (ref 8.9–10.3)
Chloride: 104 mmol/L (ref 98–111)
Creatinine, Ser: 1.66 mg/dL — ABNORMAL HIGH (ref 0.61–1.24)
GFR, Estimated: 41 mL/min — ABNORMAL LOW (ref >60)
Glucose, Bld: 131 mg/dL — ABNORMAL HIGH (ref 70–99)
Potassium: 4.8 mmol/L (ref 3.5–5.1)
Sodium: 135 mmol/L (ref 135–145)

## 2023-10-24 LAB — GLUCOSE, CAPILLARY
Glucose-Capillary: 180 mg/dL — ABNORMAL HIGH (ref 70–99)
Glucose-Capillary: 152 mg/dL — ABNORMAL HIGH (ref 70–99)

## 2023-10-24 MED ORDER — METHOCARBAMOL 500 MG PO TABS: 500.0000 mg | ORAL_TABLET | Freq: Four times a day (QID) | ORAL | 0 refills | Status: AC | PRN

## 2023-10-24 MED ORDER — CEFEPIME IV (FOR PTA / DISCHARGE USE ONLY): 2.0000 g | Freq: Two times a day (BID) | INTRAVENOUS | 0 refills | Status: DC

## 2023-10-24 MED ORDER — APIXABAN (ELIQUIS) VTE STARTER PACK (10MG AND 5MG)
ORAL_TABLET | ORAL | 0 refills | Status: DC
Start: 2023-10-24 — End: 2023-10-29
  Filled 2023-10-24: qty 74, 30d supply, fill #0

## 2023-10-24 MED ORDER — HEPARIN SOD (PORK) LOCK FLUSH 100 UNIT/ML IV SOLN
250.0000 [IU] | INTRAVENOUS | Status: AC | PRN
  Administered 2023-10-24: 250 [IU]

## 2023-10-24 MED ORDER — GUAIFENESIN ER 600 MG PO TB12: 600.0000 mg | ORAL_TABLET | Freq: Two times a day (BID) | ORAL | 0 refills | Status: DC

## 2023-10-24 MED ORDER — VITAMIN B-12 1000 MCG PO TABS: 1000.0000 ug | ORAL_TABLET | Freq: Every day | ORAL | 0 refills | Status: DC

## 2023-10-24 MED ORDER — BISACODYL 5 MG PO TBEC: 5.0000 mg | DELAYED_RELEASE_TABLET | Freq: Every day | ORAL | 0 refills | Status: DC | PRN

## 2023-10-24 MED ORDER — NICOTINE 14 MG/24HR TD PT24: 14.0000 mg | MEDICATED_PATCH | Freq: Every day | TRANSDERMAL | 0 refills | Status: DC | PRN

## 2023-10-24 MED ORDER — OXYCODONE-ACETAMINOPHEN 5-325 MG PO TABS: 1.0000 | ORAL_TABLET | Freq: Four times a day (QID) | ORAL | 0 refills | Status: DC | PRN

## 2023-10-24 MED ORDER — SODIUM CHLORIDE 0.9 % IV BOLUS: 1000.0000 mL | Freq: Once | INTRAVENOUS | Status: DC

## 2023-10-24 MED ORDER — FERROUS SULFATE 325 (65 FE) MG PO TABS: 325.0000 mg | ORAL_TABLET | Freq: Every day | ORAL | 0 refills | Status: DC

## 2023-10-24 MED ORDER — APIXABAN (ELIQUIS) VTE STARTER PACK (10MG AND 5MG)
ORAL_TABLET | ORAL | 0 refills | Status: DC
Start: 2023-10-24 — End: 2023-10-24

## 2023-10-24 MED ORDER — DAPTOMYCIN IV (FOR PTA / DISCHARGE USE ONLY): 500.0000 mg | INTRAVENOUS | 0 refills | Status: DC

## 2023-10-24 NOTE — Progress Notes (Signed)
 Lamar LELON Candy to be D/C'd  per MD order.  Discussed with the patient and Warren- neice all questions fully answered.  VSS, Skin clean, dry and intact without evidence of skin break down, no evidence of skin tears noted.  PICC line has been flush and cap by IV team. Wound vac has been transfer over to the portable vac. Vac is working properly at this time.  An After Visit Summary was printed and place with patient.   D/c education completed with patient/family including follow up instructions, medication list, d/c activities limitations if indicated, with other d/c instructions as indicated by MD - patient able to verbalize understanding, all questions fully answered.   Patient instructed to return to ED, call 911, or call MD for any changes in condition.

## 2023-10-24 NOTE — Plan of Care (Signed)
 Pt slept well overnight. Wound vac in place and functioning appropriately overnight. No complaint of pain. Problem: Education: Goal: Ability to describe self-care measures that may prevent or decrease complications (Diabetes Survival Skills Education) will improve Outcome: Progressing Goal: Individualized Educational Video(s) Outcome: Progressing   Problem: Coping: Goal: Ability to adjust to condition or change in health will improve Outcome: Progressing   Problem: Fluid Volume: Goal: Ability to maintain a balanced intake and output will improve Outcome: Progressing   Problem: Health Behavior/Discharge Planning: Goal: Ability to identify and utilize available resources and services will improve Outcome: Progressing Goal: Ability to manage health-related needs will improve Outcome: Progressing   Problem: Metabolic: Goal: Ability to maintain appropriate glucose levels will improve Outcome: Progressing   Problem: Nutritional: Goal: Maintenance of adequate nutrition will improve Outcome: Progressing Goal: Progress toward achieving an optimal weight will improve Outcome: Progressing   Problem: Skin Integrity: Goal: Risk for impaired skin integrity will decrease Outcome: Progressing   Problem: Tissue Perfusion: Goal: Adequacy of tissue perfusion will improve Outcome: Progressing   Problem: Education: Goal: Knowledge of General Education information will improve Description: Including pain rating scale, medication(s)/side effects and non-pharmacologic comfort measures Outcome: Progressing   Problem: Health Behavior/Discharge Planning: Goal: Ability to manage health-related needs will improve Outcome: Progressing   Problem: Clinical Measurements: Goal: Ability to maintain clinical measurements within normal limits will improve Outcome: Progressing Goal: Will remain free from infection Outcome: Progressing Goal: Diagnostic test results will improve Outcome:  Progressing Goal: Respiratory complications will improve Outcome: Progressing Goal: Cardiovascular complication will be avoided Outcome: Progressing   Problem: Activity: Goal: Risk for activity intolerance will decrease Outcome: Progressing   Problem: Nutrition: Goal: Adequate nutrition will be maintained Outcome: Progressing   Problem: Coping: Goal: Level of anxiety will decrease Outcome: Progressing   Problem: Elimination: Goal: Will not experience complications related to bowel motility Outcome: Progressing Goal: Will not experience complications related to urinary retention Outcome: Progressing   Problem: Pain Managment: Goal: General experience of comfort will improve and/or be controlled Outcome: Progressing   Problem: Safety: Goal: Ability to remain free from injury will improve Outcome: Progressing   Problem: Skin Integrity: Goal: Risk for impaired skin integrity will decrease Outcome: Progressing   Problem: Clinical Measurements: Goal: Ability to avoid or minimize complications of infection will improve Outcome: Progressing   Problem: Skin Integrity: Goal: Skin integrity will improve Outcome: Progressing

## 2023-10-24 NOTE — TOC Progression Note (Addendum)
 Transition of Care (TOC) - Progression Note   Patient for discharge today.   NCM notified Pam with [facility] and Jon with [facility].   NCM asked hospital nurse to give DAPTO dose before discharge due at 10 am. [facility] will deliver this afternoon dose for them to start with PM dose of Cefepime     Jamie called back, and needs to change the address of where patient is going to [address] , [place] . PTAR notified.   Warren will move patient's home oxygen and wheelchair from old address to new address before patient arrives today.   Pam with [employer] aware and will change address of his IV ABX delivery.   Jon with [facility] aware.   Dawn with [employer] aware of new address also .    Florence Hurl with [facility] back with new address and changed address on paperwork   Patient Details  Name: Joe Coleman MRN: [id number] Date of Birth: 11/23/1945  Transition of Care [facility]) CM/SW Contact  Joe Coleman, Powell Jansky, RN Phone Number: 10/24/2023, 10:34 AM  Clinical Narrative:       Expected Discharge Plan: Home w Home Health Services Barriers to Discharge: Insurance Authorization, Continued Medical Work up, SNF Pending bed offer               Expected Discharge Plan and Services   Discharge Planning Services: CM Consult Post Acute Care Choice: Home Health Living arrangements for the past 2 months: Apartment                 DME Arranged: N/A DME Agency: NA       HH Arranged: RN, PT, OT HH Agency: Surveyor, mining, Other - See comment Date HH Agency Contacted: 10/20/23 Time HH Agency Contacted: [phone] Representative spoke with at [facility] Agency: Pam with [facility] and Jon with [employer]   Social Drivers of Health (SDOH) Interventions SDOH Screenings   Food Insecurity: No Food Insecurity (10/14/2023)  Housing: Unknown (10/14/2023)  Transportation Needs: No Transportation Needs (10/14/2023)  Utilities: Not At Risk (10/14/2023)  Financial Resource Strain: Medium Risk (05/02/2021)   Received from  [facility]  Physical Activity: Inactive (08/25/2018)   [facility] from [facility]  Social Connections: Unknown (10/14/2023)  Stress: No Stress Concern Present (08/25/2018)   Received from [facility]  Tobacco Use: Medium Risk (10/15/2023)  Health Literacy: High Risk (05/04/2022)   Received from [facility]    Readmission Risk Interventions    09/01/2023   12:00 PM  Readmission Risk Prevention Plan  Transportation Screening Complete  Medication Review (RN Care Manager) Complete  PCP or Specialist appointment within 3-5 days of discharge Complete  HRI or Home Care Consult Complete  SW Recovery Care/Counseling Consult Complete  Palliative Care Screening Not Applicable  Skilled Nursing Facility Complete

## 2023-10-24 NOTE — Progress Notes (Signed)
 Physical Therapy Treatment Patient Details Name: Joe Coleman MRN: [id number] DOB: [birth year] Today's Date: 10/24/2023   History of Present Illness Pt is 78 yo male who presents on 10/14/23 with surgical wound dehiscence R knee with surgical hardware visible. Pt had R tib fx with ORIF 7/25 with wound infection noted 8/28. On 9/10 pt underwent removal of hardware, I&D, skin graft, and wound vac placement. PMH: DM2, COPD, HTN, past tobacco abuse    PT Comments  Pt resting in bed on arrival, pleasant and agreeable to session and demonstrating good progress towards acute goals. Pt able to progress ambulation this session, hopping on LLE with RW for support and mod A to maintain balance ~5' with good adherence to NWB on R. Pt quick to fatigue and receptive to education of use of w/c in home. Pt able to come to standing with min A to steady on rise and with fair static standing balance with RW for support. Discussed safe car entry/exit and appropriate activity progression with pt verbalizing understanding. Pt continues to benefit from skilled PT services to progress toward functional mobility goals.      If plan is discharge home, recommend the following: A lot of help with walking and/or transfers;A little help with bathing/dressing/bathroom;Assistance with cooking/housework;Assist for transportation   Can travel by private vehicle     No  Equipment Recommendations  None recommended by PT    Recommendations for Other Services       Precautions / Restrictions Precautions Precautions: Fall Recall of Precautions/Restrictions: Impaired Precaution/Restrictions Comments: after initial surgery, fell, causing wound that has now dehisced Required Braces or Orthoses: Splint/Cast Splint/Cast: RLE long leg splint (prevents knee flexion) Splint/Cast - Date Prophylactic Dressing Applied (if applicable): 10/15/23 Restrictions Weight Bearing Restrictions Per Provider Order: Yes RLE Weight Bearing Per  Provider Order: Non weight bearing     Mobility  Bed Mobility Overal bed mobility: Needs Assistance Bed Mobility: Supine to Sit     Supine to sit: Supervision, HOB elevated, Used rails     General bed mobility comments: no assist needed    Transfers Overall transfer level: Needs assistance Equipment used: Rolling walker (2 wheels) Transfers: Sit to/from Stand Sit to Stand: Min assist           General transfer comment: cues for technique, able to maintain NWB on rise to stand, ligjt assist to steady on rise    Ambulation/Gait Ambulation/Gait assistance: Mod assist Gait Distance (Feet): 5 Feet Assistive device: Rolling walker (2 wheels) Gait Pattern/deviations:  (hop-to) Gait velocity: decr     General Gait Details: pt able to hop on L with mod A to maintain balance, distance limited due to pt LLE weakness   Stairs             Wheelchair Mobility     Tilt Bed    Modified Rankin (Stroke Patients Only)       Balance Overall balance assessment: Needs assistance Sitting-balance support: No upper extremity supported, Feet supported Sitting balance-Leahy Scale: Good     Standing balance support: Bilateral upper extremity supported Standing balance-Leahy Scale: Poor Standing balance comment: reliant on external support                            Communication Communication Communication: No apparent difficulties Factors Affecting Communication: Reduced clarity of speech (at times)  Cognition Arousal: Alert Behavior During Therapy: Flat affect   PT - Cognitive impairments: Safety/Judgement, Problem solving  Following commands: Intact      Cueing Cueing Techniques: Verbal cues  Exercises      General Comments General comments (skin integrity, edema, etc.): discussed safe car transfers and utilizing W/C throughout home      Pertinent Vitals/Pain Pain Assessment Pain Assessment: Faces Faces  Pain Scale: Hurts a little bit Pain Location: RLE Pain Descriptors / Indicators: Burning Pain Intervention(s): Monitored during session, Limited activity within patient's tolerance, Repositioned    Home Living                          Prior Function            PT Goals (current goals can now be found in the care plan section) Acute Rehab PT Goals Patient Stated Goal: home PT Goal Formulation: With patient Time For Goal Achievement: 10/30/23 Progress towards PT goals: Progressing toward goals    Frequency    Min 2X/week      PT Plan      Co-evaluation              AM-PAC PT 6 Clicks Mobility   Outcome Measure  Help needed turning from your back to your side while in a flat bed without using bedrails?: A Little Help needed moving from lying on your back to sitting on the side of a flat bed without using bedrails?: A Little Help needed moving to and from a bed to a chair (including a wheelchair)?: A Little Help needed standing up from a chair using your arms (e.g., wheelchair or bedside chair)?: A Lot (for cues to maintain NWB) Help needed to walk in hospital room?: Total (<20') Help needed climbing 3-5 steps with a railing? : Total 6 Click Score: 13    End of Session Equipment Utilized During Treatment: Oxygen Activity Tolerance: Patient tolerated treatment well Patient left: in chair;with call bell/phone within reach;with chair alarm set Nurse Communication: Mobility status PT Visit Diagnosis: Unsteadiness on feet (R26.81);History of falling (Z91.81);Difficulty in walking, not elsewhere classified (R26.2);Pain Pain - Right/Left: Right Pain - part of body: Leg     Time: 9056-8992 PT Time Calculation (min) (ACUTE ONLY): 24 min  Charges:    $Gait Training: 8-22 mins $Therapeutic Activity: 8-22 mins PT General Charges $$ ACUTE PT VISIT: 1 Visit                     Shakeera Rightmyer R. PTA Acute Rehabilitation Services Office: [phone]   Therisa CHRISTELLA Boor 10/24/2023, 1:23 PM

## 2023-10-25 NOTE — Discharge Summary (Signed)
 Physician Discharge Summary   Patient: Joe Coleman MRN: [id number] DOB: 02-Jun-1945  Admit date:     10/14/2023  Discharge date: 10/24/2023  Discharge Physician: Yetta Blanch  PCP: [facility]  Recommendations at discharge: Follow-up with PCP in 1 week. Follow-up with orthopedic as recommended. Repeat CBC and BMP in 1 week   Follow-up Information     Haddix, Franky SQUIBB, MD. Go on 10/27/2023.   Specialty: Orthopedic Surgery Why: 10/27/2023 at 11AM for wound vac removal Contact information: [address] [place] [address] [phone]         [facility] Follow up.   Specialty: Home Health Services Why: Advice worker information: [address] [place] [address] [phone]         [facility] Follow up.   Why: [phone]        [facility]. Schedule an appointment as soon as possible for a visit in 1 week(s).   Why: with CBC lab to look at blood counts, with BMP lab to look at kidney/electrolyte numbers Contact information: [address] [place] [address] [phone]                Hospital Course: This 78 year old Male with PMH significant for DM2, HTN comes into the hospital with wound dehiscence. He was admitted for leg injury on July 19 with right tibial fracture, underwent immobilization with a long-leg splint with ORIF 08/25/23, discharged 7/28. He was seen in the ED mid August with concern for wound infection and started on doxycycline . Did not really improve and came back to the hospital. Orthopedic surgery consulted and he was taken to the OR on 9/10 /25 for removal of hardware.  The patient has had a PICCline placed and OPAT orders are in place. He will need to have home health set up for him to get his long term IV antibiotics. ID recommends 6 weeks of IV antibiotics. Cultures have grown out enterobacter Cloquel and Klebsiella pneumoniae. He will receive 6 weeks of  IV daptomycin  and cefepime .   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 Orthopedic surgery consulted, he was taken to the OR on 10/15/23 status post hardware removal, I&D right proximal tibia, skin graft, long-leg splint and wound VAC placement. Continue to monitor postoperatively, intraoperative cultures have grown out enterobacter Cloquel and Klebsiella pneumoniae.. Pt is receiving IV cefepime  and Daptomycin . Blood cultures have had no growth to date. Patient has had a PICC line placed and OPAT orders are in place. Patient will need 6 weeks of IV antibiotics(daptomycin  and cefepime ).  Acute left leg distal DVT. D-dimer mildly elevated but no hypoxia. EKG also does not show any evidence of significant PE. Chest pain resolved. Lower extremity Doppler is positive for distal DVT. Given his recent tibia-fibula fracture as well as for infected hardware and limited mobility patient remains at a high risk for further clot propagation. Therefore not a good candidate for observation with serial ultrasound. Initiated on anticoagulation after conversation with the family.  Tolerating Eliquis . Concerned that the patient has chronic anemia with recent postop acute blood loss anemia requiring blood transfusion. Hemoglobin remaining stable.  Less likely PE.  History of tobacco use : No documentation of COPD but he is on inhalers and home O2. Quit smoking 14 years ago.   Hyperkalemia: Lokelma  x 1 9/11, potassium now  normalized.   Hypomagnesemia: Replaced.  Continue to monitor   Decreased pedal pulses: ABIs fairly unremarkable, could not be obtained on the right lower extremity.   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 Follow-up BMP recommended.   Anemia - of chronic disease: Hemoglobin remaining stable after initiation of  anticoagulation.  Monitor.   DM2  A1c 7.6  Resuming home medication.  Chest pain.  Likely noncardiac mostly musculoskeletal. Patient reported chest pain on the left side. Serial troponins are negative.   Pain control - [place]  Controlled Substance Reporting System database was reviewed. and patient was instructed, not to drive, operate heavy machinery, perform activities at heights, swimming or participation in water  activities or provide baby-sitting services while on Pain, Sleep and Anxiety Medications; until their outpatient Physician has advised to do so again. Also recommended to not to take more than prescribed Pain, Sleep and Anxiety Medications.  Consultants:  Orthopedics ID  Procedures performed:  PICC line placement. Echocardiogram TEE  CPT 20680-Removal of hardware right tibia CPT 10180-Irrigation and debridement right proximal tibia CPT 15271-Skin graft substitute placement right proximal tibia CPT 29505-Application of long leg splint right leg CPT 97605-Wound vac placement to right proximal tibia    DISCHARGE MEDICATION: Allergies as of 10/24/2023       Reactions   Losartan Other (See Comments)   Hyperkalemia (>5.5 multiple times on losartan 25mg )   Chicken Allergy Nausea And Vomiting      Poultry Meal Nausea And Vomiting        Medication List     STOP taking these medications    doxycycline  100 MG tablet Commonly known as: VIBRA -TABS   HYDROcodone -acetaminophen  5-325 MG tablet Commonly known as: NORCO/VICODIN   oxyCODONE  5 MG immediate release tablet Commonly known as: Roxicodone        TAKE these medications    albuterol  108 (90 Base) MCG/ACT inhaler Commonly known as: VENTOLIN  HFA Inhale 2 puffs into the lungs every 6 (six) hours as needed for wheezing or shortness of breath.   bisacodyl  5 MG EC tablet Commonly known as: DULCOLAX Take 1 tablet (5 mg total) by mouth daily as needed for moderate constipation.   ceFEPime   IVPB Commonly known as: MAXIPIME  Inject 2 g into the vein every 12 (twelve) hours. Indication:  RIGHT TIBIAL INFECTION First Dose: Yes Last Day of Therapy:  11/25/2023 Labs - Once weekly:  CBC/D and BMP, Labs - Once weekly: ESR and CRP Method of administration: IV Push Method of administration may be changed at the discretion of home infusion pharmacist based upon assessment of the patient and/or caregiver's ability to self-administer the medication ordered.   cyanocobalamin  1000 MCG tablet Commonly known as: VITAMIN B12 Take 1 tablet (1,000 mcg total) by mouth daily.   daptomycin  IVPB Commonly known as: CUBICIN  Inject 500 mg into the vein daily. Indication:  RIGHT TIBIAL INFECTION First Dose: Yes Last Day of Therapy:  11/25/2023 Labs - Once weekly:  CBC/D, BMP, and CPK Labs - Once weekly: ESR and CRP Method of administration: IV Push Method of administration may be changed at the discretion of home infusion pharmacist based upon assessment of the patient and/or caregiver's ability to self-administer the medication ordered.   Eliquis  DVT/PE Starter Pack Generic drug: Apixaban  Starter Pack (10mg  and 5mg ) Take as directed on package: start with two-5mg  tablets twice daily for 7 days. On day 8, switch to one-5mg  tablet twice daily.   feeding supplement (GLUCERNA SHAKE) Liqd Take 237  mLs by mouth 3 (three) times daily between meals.   ferrous sulfate  325 (65 FE) MG tablet Take 1 tablet (325 mg total) by mouth daily with breakfast.   gabapentin  300 MG capsule Commonly known as: NEURONTIN  Take 300 mg by mouth at bedtime.   guaiFENesin  600 MG 12 hr tablet Commonly known as: Mucinex  Take 1 tablet (600 mg total) by mouth 2 (two) times daily.   ipratropium-albuterol  0.5-2.5 (3) MG/3ML Soln Commonly known as: DUONEB Take 3 mLs by nebulization every 4 (four) hours as needed.   metFORMIN  500 MG tablet Commonly known as: GLUCOPHAGE  Take 500 mg by mouth 2 (two) times daily with a  meal.   methocarbamol  500 MG tablet Commonly known as: ROBAXIN  Take 1 tablet (500 mg total) by mouth every 6 (six) hours as needed for muscle spasms.   nicotine  14 mg/24hr patch Commonly known as: NICODERM CQ  - dosed in mg/24 hours Place 1 patch (14 mg total) onto the skin daily as needed (tobacco dependence).   oxyCODONE -acetaminophen  5-325 MG tablet Commonly known as: Percocet Take 1 tablet by mouth every 6 (six) hours as needed for severe pain (pain score 7-10) or moderate pain (pain score 4-6).   pantoprazole  40 MG tablet Commonly known as: PROTONIX  Take 40 mg by mouth daily.   polyethylene glycol 17 g packet Commonly known as: MIRALAX  / GLYCOLAX  Take 17 g by mouth daily.   senna-docusate 8.6-50 MG tablet Commonly known as: Senokot-S Take 1 tablet by mouth at bedtime.   Trelegy Ellipta 200-62.5-25 MCG/ACT Aepb Generic drug: Fluticasone-Umeclidin-Vilant Inhale 1 puff into the lungs daily.   vitamin D3 25 MCG tablet Commonly known as: CHOLECALCIFEROL  Take 1 tablet (1,000 Units total) by mouth daily.               Discharge Care Instructions  (From admission, onward)           Start     Ordered   10/24/23 0000  Change dressing on IV access line weekly and PRN  (Home infusion instructions - [facility] )        10/24/23 1025   10/24/23 0000  Leave dressing on - Keep it clean, dry, and intact until clinic visit        10/24/23 1050           Disposition: Home SNF recommended although family and the patient wanted to go home. Diet recommendation: Cardiac diet  Discharge Exam: Vitals:   10/23/23 1623 10/23/23 2052 10/24/23 0556 10/24/23 0732  BP: 135/60 (!) 152/85 137/74 (!) 149/80  Pulse: 84 82 75 75  Resp: 18 20 19 16   Temp: 98.6 F (37 C) 98.1 F (36.7 C) 98.1 F (36.7 C) 97.9 F (36.6 C)  TempSrc:  Oral Oral Oral  SpO2: 100% 99% 99% 100%  Weight:      Height:       Basal crackles. S1-S2 present Bowel sound present Trace  edema.  Filed Weights   10/14/23 1650  Weight: 65.8 kg   Condition at discharge: stable  The results of significant diagnostics from this hospitalization (including imaging, microbiology, ancillary and laboratory) are listed below for reference.   Imaging Studies: VAS US  LOWER EXTREMITY VENOUS (DVT) Result Date: 10/23/2023  Lower Venous DVT Study Patient Name:  DELQUAN POUCHER  Date of Exam:   10/23/2023 Medical Rec #: [id number]        Accession #:    [id number] Date of Birth: Nov 18, 1945  Patient Gender: M Patient Age:   67 years Exam Location:  [facility] Procedure:      VAS US  LOWER EXTREMITY VENOUS (DVT) Referring Phys: Aicha Clingenpeel --------------------------------------------------------------------------------  Indications: Left calf pain. Status post right tibial fracture 08/20/23, with ORIF 08/25/23. Readmitted for hardware removal secondary to infection, 10/15/23.  Limitations: Bandages and Wound vac on right leg from distal thigh over foot. Comparison Study: No prior study on file Performing Technologist: Alberta Lis RVS  Examination Guidelines: A complete evaluation includes B-mode imaging, spectral Doppler, color Doppler, and power Doppler as needed of all accessible portions of each vessel. Bilateral testing is considered an integral part of a complete examination. Limited examinations for reoccurring indications may be performed as noted. The reflux portion of the exam is performed with the patient in reverse Trendelenburg.  +---------+---------------+---------+-----------+----------+--------------+ RIGHT    CompressibilityPhasicitySpontaneityPropertiesThrombus Aging +---------+---------------+---------+-----------+----------+--------------+ CFV      Full           Yes      Yes                                 +---------+---------------+---------+-----------+----------+--------------+ SFJ      Full                                                         +---------+---------------+---------+-----------+----------+--------------+ FV Prox  Full                                                        +---------+---------------+---------+-----------+----------+--------------+ FV Mid   Full                                                        +---------+---------------+---------+-----------+----------+--------------+ FV DistalFull                                                        +---------+---------------+---------+-----------+----------+--------------+ PFV      Full                                                        +---------+---------------+---------+-----------+----------+--------------+   +---------+---------------+---------+-----------+----------+--------------+ LEFT     CompressibilityPhasicitySpontaneityPropertiesThrombus Aging +---------+---------------+---------+-----------+----------+--------------+ CFV      Full           Yes      Yes                                 +---------+---------------+---------+-----------+----------+--------------+ SFJ      Full                                                        +---------+---------------+---------+-----------+----------+--------------+  FV Prox  Full                                                        +---------+---------------+---------+-----------+----------+--------------+ FV Mid   Full                                                        +---------+---------------+---------+-----------+----------+--------------+ FV DistalFull           Yes      Yes                                 +---------+---------------+---------+-----------+----------+--------------+ PFV      Full                                                        +---------+---------------+---------+-----------+----------+--------------+ POP      Full           Yes      Yes                                  +---------+---------------+---------+-----------+----------+--------------+ PTV      Partial                                      Acute          +---------+---------------+---------+-----------+----------+--------------+ PERO     None                                         Acute          +---------+---------------+---------+-----------+----------+--------------+     Summary: RIGHT: - There is no evidence of deep vein thrombosis in the lower extremity. However, portions of this examination were limited- see technologist comments above.  LEFT: - Findings consistent with acute deep vein thrombosis involving one of the paired left posterior tibial veins, and both of the left peroneal veins.  - No cystic structure found in the popliteal fossa.  *See table(s) above for measurements and observations. Electronically signed by Debby Robertson on 10/23/2023 at 4:06:19 PM.    Final    DG CHEST PORT 1 VIEW Result Date: 10/22/2023 EXAM: 1 VIEW XRAY OF THE CHEST 10/22/2023 11:35:00 AM COMPARISON: 10/17/2023 CLINICAL HISTORY: SOB (shortness of breath) FINDINGS: LINES, TUBES AND DEVICES: Left PICC with tip in distal SVC. Surgical clips at GE junction. LUNGS AND PLEURA: Blunting of left costophrenic angle without definite pleural effusion. Stable left basilar opacities. HEART AND MEDIASTINUM: No acute abnormality of the cardiac and mediastinal silhouettes. BONES AND SOFT TISSUES: Thoracic spondylosis. IMPRESSION: 1. Interval placement of left PICC with tip over the distal SVC. 2. Stable left basilar opacities, favor atelectasis versus scarring. Electronically signed by: Donnice  Hunt MD 10/22/2023 12:50 PM EDT RP Workstation: [id number]   DG CHEST PORT 1 VIEW Result Date: 10/17/2023 CLINICAL DATA:  Cough EXAM: PORTABLE CHEST 1 VIEW COMPARISON:  August 31, 2023 chest radiograph and prior studies FINDINGS: Similar appearance of streaky bibasilar opacities, likely representing atelectasis and or scarring. No new  consolidation. No pleural effusions. No pneumothorax. Unchanged cardiomediastinal silhouette. IMPRESSION: Likely bibasilar scarring/atelectasis. Electronically Signed   By: Michaeline Blanch M.D.   On: 10/17/2023 11:44   VAS US  ABI WITH/WO TBI Result Date: 10/17/2023  LOWER EXTREMITY DOPPLER STUDY Patient Name:  EVERITT WENNER  Date of Exam:   10/16/2023 Medical Rec #: [id number]        Accession #:    [id number] Date of Birth: [birth year]        Patient Gender: M Patient Age:   49 years Exam Location:  [facility] Procedure:      VAS US  ABI WITH/WO TBI Referring Phys: EKTA Jachob Mcclean --------------------------------------------------------------------------------  Indications: Decreased pedal pulses. High Risk Factors: Hypertension, Diabetes, past history of smoking.  Limitations: Today's exam was limited due to an open wound, bandages and post-op              cast, unable to compress. Comparison Study: No prior exam. Performing Technologist: Edilia Elden Appl  Examination Guidelines: A complete evaluation includes at minimum, Doppler waveform signals and systolic blood pressure reading at the level of bilateral brachial, anterior tibial, and posterior tibial arteries, when vessel segments are accessible. Bilateral testing is considered an integral part of a complete examination. Photoelectric Plethysmograph (PPG) waveforms and toe systolic pressure readings are included as required and additional duplex testing as needed. Limited examinations for reoccurring indications may be performed as noted.  ABI Findings: +---------+------------------+-----+---------+-------------+ Right    Rt Pressure (mmHg)IndexWaveform Comment       +---------+------------------+-----+---------+-------------+ Brachial 129                    triphasic              +---------+------------------+-----+---------+-------------+ PTA                                      Not obtained.  +---------+------------------+-----+---------+-------------+ DP                              triphasic              +---------+------------------+-----+---------+-------------+ Great Toe70                0.54 Normal                 +---------+------------------+-----+---------+-------------+ +---------+------------------+-----+---------+-------+ Left     Lt Pressure (mmHg)IndexWaveform Comment +---------+------------------+-----+---------+-------+ Brachial 127                    triphasic        +---------+------------------+-----+---------+-------+ PTA      144               1.12 triphasic        +---------+------------------+-----+---------+-------+ DP       133               1.03 triphasic        +---------+------------------+-----+---------+-------+ Great Toe101               0.78 Normal           +---------+------------------+-----+---------+-------+  Unable to obtain right lower extremity pressures due to patient's post-op cast. Waveform dopplers of the dorsalis pedis visualized.  Summary: Right: The right toe-brachial index is abnormal.  Unable to obtain pressures. Left: Resting left ankle-brachial index is within normal range. The left toe-brachial index is normal.  *See table(s) above for measurements and observations.  Electronically signed by Debby Robertson on 10/17/2023 at 7:17:31 AM.    Final    US  EKG SITE RITE Result Date: 10/17/2023 If Site Rite image not attached, placement could not be confirmed due to current cardiac rhythm.  DG Tibia/Fibula Right Port Result Date: 10/15/2023 CLINICAL DATA:  Left tibial and fibular fractures. EXAM: PORTABLE RIGHT TIBIA AND FIBULA - 2 VIEW COMPARISON:  October 14, 2023.  August 25, 2023. FINDINGS: The right lower leg has been casted and immobilized status post surgical removal of fixation hardware. Mildly to moderately displaced and comminuted fracture is seen involving the proximal right tibia, with mildly displaced  proximal right fibula fracture. IMPRESSION: Casted and immobilized right lower leg status post surgical removal of fixation hardware. Electronically Signed   By: Lynwood Landy Raddle M.D.   On: 10/15/2023 13:37   DG Tibia/Fibula Right Result Date: 10/15/2023 CLINICAL DATA:  Hardware removal. EXAM: DG TIBIA/FIBULA 2V*R*; DG C-ARM 1-60 MIN-NO REPORT Radiation exposure index: 1.02 mGy. COMPARISON:  October 14, 2023. FINDINGS: Five intraoperative fluoroscopic images were obtained of the right lower leg. These images demonstrate interval removal of surgical internal fixation hardware involving proximal tibia. Mildly displaced oblique fracture of proximal tibia is again noted. Status post right total knee arthroplasty. IMPRESSION: Fluoroscopic guidance provided during surgical hardware removal. Electronically Signed   By: Lynwood Landy Raddle M.D.   On: 10/15/2023 13:35   DG C-Arm 1-60 Min-No Report Result Date: 10/15/2023 CLINICAL DATA:  Hardware removal. EXAM: DG TIBIA/FIBULA 2V*R*; DG C-ARM 1-60 MIN-NO REPORT Radiation exposure index: 1.02 mGy. COMPARISON:  October 14, 2023. FINDINGS: Five intraoperative fluoroscopic images were obtained of the right lower leg. These images demonstrate interval removal of surgical internal fixation hardware involving proximal tibia. Mildly displaced oblique fracture of proximal tibia is again noted. Status post right total knee arthroplasty. IMPRESSION: Fluoroscopic guidance provided during surgical hardware removal. Electronically Signed   By: Lynwood Landy Raddle M.D.   On: 10/15/2023 13:35   DG C-Arm 1-60 Min-No Report Result Date: 10/15/2023 CLINICAL DATA:  Hardware removal. EXAM: DG TIBIA/FIBULA 2V*R*; DG C-ARM 1-60 MIN-NO REPORT Radiation exposure index: 1.02 mGy. COMPARISON:  October 14, 2023. FINDINGS: Five intraoperative fluoroscopic images were obtained of the right lower leg. These images demonstrate interval removal of surgical internal fixation hardware involving proximal  tibia. Mildly displaced oblique fracture of proximal tibia is again noted. Status post right total knee arthroplasty. IMPRESSION: Fluoroscopic guidance provided during surgical hardware removal. Electronically Signed   By: Lynwood Landy Raddle M.D.   On: 10/15/2023 13:35   DG Knee 2 Views Right Result Date: 10/14/2023 CLINICAL DATA:  wound, visible surg hardware EXAM: RIGHT KNEE - 1-2 VIEW COMPARISON:  08/25/2023. FINDINGS: Previously seen comminuted tibial fractures and proximal fibular fractures are again seen. No acute fracture or dislocation. No aggressive osseous lesion. 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 The hardware is intact. No periprosthetic fracture or lucency. There is near anatomic alignment. No knee effusion or focal soft tissue swelling. No radiopaque foreign bodies. IMPRESSION: No acute osseous abnormality of the right knee. Electronically Signed   By: Ree Molt M.D.   On: 10/14/2023 15:23  Microbiology: Results for orders placed or performed during the hospital encounter of 10/14/23  Blood culture (routine x 2)     Status: None   Collection Time: 10/14/23  2:45 PM   Specimen: BLOOD  Result Value Ref Range Status   Specimen Description BLOOD SITE NOT SPECIFIED  Final   Special Requests   Final    BOTTLES DRAWN AEROBIC AND ANAEROBIC Blood Culture results may not be optimal due to an inadequate volume of blood received in culture bottles   Culture   Final    NO GROWTH 5 DAYS Performed at [facility], [address] [place], [place] [address]    Report Status 10/19/2023 FINAL  Final  Blood culture (routine x 2)     Status: None   Collection Time: 10/14/23  2:45 PM   Specimen: BLOOD  Result Value Ref Range Status   Specimen Description BLOOD SITE NOT SPECIFIED  Final   Special Requests   Final    BOTTLES DRAWN AEROBIC AND ANAEROBIC Blood Culture results may not be optimal due to an inadequate volume of blood received in culture  bottles   Culture   Final    NO GROWTH 5 DAYS Performed at [facility], [address], [place], [place] [address]    Report Status 10/19/2023 FINAL  Final  MRSA Next Gen by PCR, Nasal     Status: None   Collection Time: 10/15/23  6:02 AM   Specimen: Nasal Mucosa; Nasal Swab  Result Value Ref Range Status   MRSA by PCR Next Gen NOT DETECTED NOT DETECTED Final    Comment: (NOTE) The GeneXpert MRSA Assay (FDA approved for NASAL specimens only), is one component of a comprehensive MRSA colonization surveillance program. It is not intended to diagnose MRSA infection nor to guide or monitor treatment for MRSA infections. Test performance is not FDA approved in patients less than 76 years old. Performed at [facility], [address] [place], [place] [address]   Aerobic/Anaerobic Culture w Gram Stain (surgical/deep wound)     Status: None   Collection Time: 10/15/23  9:22 AM   Specimen: Leg, Right; Tissue  Result Value Ref Range Status   Specimen Description TISSUE  Final   Special Requests RIGHT TIBIAL INFECTION 1  Final   Gram Stain NO WBC SEEN NO ORGANISMS SEEN   Final   Culture   Final    RARE ENTEROBACTER CLOACAE FEW KLEBSIELLA PNEUMONIAE NO ANAEROBES ISOLATED Performed at [facility], [address], [place], [place] [address]    Report Status 10/20/2023 FINAL  Final   Organism ID, Bacteria ENTEROBACTER CLOACAE  Final   Organism ID, Bacteria KLEBSIELLA PNEUMONIAE  Final      Susceptibility   Enterobacter cloacae - MIC*    CEFEPIME  0.25 SENSITIVE Sensitive     ERTAPENEM  <=0.12 SENSITIVE Sensitive     CIPROFLOXACIN 0.25 SENSITIVE Sensitive     GENTAMICIN <=1 SENSITIVE Sensitive     MEROPENEM <=0.25 SENSITIVE Sensitive     TRIMETH/SULFA <=20 SENSITIVE Sensitive     PIP/TAZO Value in next row Sensitive ug/mL     8 SENSITIVEThis is a modified FDA-approved test that has been validated and its performance characteristics determined by the reporting  laboratory.  This laboratory is certified under the Clinical Laboratory Improvement Amendments CLIA as qualified to perform high complexity clinical laboratory testing.    * RARE ENTEROBACTER CLOACAE   Klebsiella pneumoniae - MIC*    AMPICILLIN  Value in next row Resistant      8 SENSITIVEThis is a modified FDA-approved test that has been validated and its performance characteristics determined by the reporting laboratory.  This laboratory is certified under the Clinical Laboratory Improvement Amendments CLIA as qualified to perform high complexity clinical laboratory testing.    CEFAZOLIN  (NON-URINE) Value in next row Sensitive      8 SENSITIVEThis is a modified FDA-approved test that has been validated and its performance characteristics determined by the reporting laboratory.  This laboratory is certified under the Clinical Laboratory Improvement Amendments CLIA as qualified to perform high complexity clinical laboratory testing.    CEFEPIME  Value in next row Sensitive      8 SENSITIVEThis is a modified FDA-approved test that has been validated and its performance characteristics determined by the reporting laboratory.  This laboratory is certified under the Clinical Laboratory Improvement Amendments CLIA as qualified to perform high complexity clinical laboratory testing.    ERTAPENEM  Value in next row Sensitive      8 SENSITIVEThis is a modified FDA-approved test that has been validated and its performance characteristics determined by the reporting laboratory.  This laboratory is certified under the Clinical Laboratory Improvement Amendments CLIA as qualified to perform high complexity clinical laboratory testing.    CEFTRIAXONE Value in next row Sensitive      8 SENSITIVEThis is a modified FDA-approved test that has been validated and its performance characteristics determined by the reporting laboratory.  This laboratory is certified under the Clinical Laboratory Improvement Amendments CLIA as  qualified to perform high complexity clinical laboratory testing.    CIPROFLOXACIN Value in next row Sensitive      8 SENSITIVEThis is a modified FDA-approved test that has been validated and its performance characteristics determined by the reporting laboratory.  This laboratory is certified under the Clinical Laboratory Improvement Amendments CLIA as qualified to perform high complexity clinical laboratory testing.    GENTAMICIN Value in next row Sensitive      8 SENSITIVEThis is a modified FDA-approved test that has been validated and its performance characteristics determined by the reporting laboratory.  This laboratory is certified under the Clinical Laboratory Improvement Amendments CLIA as qualified to perform high complexity clinical laboratory testing.    MEROPENEM Value in next row Sensitive      8 SENSITIVEThis is a modified FDA-approved test that has been validated and its performance characteristics determined by the reporting laboratory.  This laboratory is certified under the Clinical Laboratory Improvement Amendments CLIA as qualified to perform high complexity clinical laboratory testing.    TRIMETH/SULFA Value in next row Sensitive      8 SENSITIVEThis is a modified FDA-approved test that has been validated and its performance characteristics determined by the reporting laboratory.  This laboratory is certified under the Clinical Laboratory Improvement Amendments CLIA as qualified to perform high complexity clinical laboratory testing.    AMPICILLIN/SULBACTAM Value in next row Sensitive      8 SENSITIVEThis is a modified FDA-approved test that has been validated and its performance characteristics determined by the reporting laboratory.  This laboratory is certified under the Clinical Laboratory Improvement Amendments CLIA as qualified to perform high complexity clinical laboratory testing.    PIP/TAZO Value in next row Sensitive ug/mL     <=4 SENSITIVEThis is a modified FDA-approved  test that has been validated and its performance characteristics determined by the reporting laboratory.  This laboratory is certified under the Clinical Laboratory Improvement Amendments CLIA as qualified to perform high  complexity clinical laboratory testing.    * FEW KLEBSIELLA PNEUMONIAE  Aerobic/Anaerobic Culture w Gram Stain (surgical/deep wound)     Status: None   Collection Time: 10/15/23  9:24 AM   Specimen: Leg, Right; Tissue  Result Value Ref Range Status   Specimen Description TISSUE  Final   Special Requests RIGHT TIBIAL INFECTION 2  Final   Gram Stain   Final    NO WBC SEEN RARE GRAM POSITIVE COCCI IN CLUSTERS    Culture   Final    FEW ENTEROBACTER CLOACAE RARE KLEBSIELLA PNEUMONIAE NO ANAEROBES ISOLATED Performed at [facility], [address] [place], [place] [address]    Report Status 10/20/2023 FINAL  Final   Organism ID, Bacteria ENTEROBACTER CLOACAE  Final   Organism ID, Bacteria KLEBSIELLA PNEUMONIAE  Final      Susceptibility   Enterobacter cloacae - MIC*    CEFEPIME  <=0.12 SENSITIVE Sensitive     ERTAPENEM  <=0.12 SENSITIVE Sensitive     CIPROFLOXACIN <=0.06 SENSITIVE Sensitive     GENTAMICIN <=1 SENSITIVE Sensitive     MEROPENEM <=0.25 SENSITIVE Sensitive     TRIMETH/SULFA <=20 SENSITIVE Sensitive     PIP/TAZO Value in next row Sensitive ug/mL     <=4 SENSITIVEThis is a modified FDA-approved test that has been validated and its performance characteristics determined by the reporting laboratory.  This laboratory is certified under the Clinical Laboratory Improvement Amendments CLIA as qualified to perform high complexity clinical laboratory testing.    * FEW ENTEROBACTER CLOACAE   Klebsiella pneumoniae - MIC*    AMPICILLIN Value in next row Resistant      <=4 SENSITIVEThis is a modified FDA-approved test that has been validated and its performance characteristics determined by the reporting laboratory.  This laboratory is certified under the Clinical  Laboratory Improvement Amendments CLIA as qualified to perform high complexity clinical laboratory testing.    CEFAZOLIN  (NON-URINE) Value in next row Sensitive      <=4 SENSITIVEThis is a modified FDA-approved test that has been validated and its performance characteristics determined by the reporting laboratory.  This laboratory is certified under the Clinical Laboratory Improvement Amendments CLIA as qualified to perform high complexity clinical laboratory testing.    CEFEPIME  Value in next row Sensitive      <=4 SENSITIVEThis is a modified FDA-approved test that has been validated and its performance characteristics determined by the reporting laboratory.  This laboratory is certified under the Clinical Laboratory Improvement Amendments CLIA as qualified to perform high complexity clinical laboratory testing.    ERTAPENEM  Value in next row Sensitive      <=4 SENSITIVEThis is a modified FDA-approved test that has been validated and its performance characteristics determined by the reporting laboratory.  This laboratory is certified under the Clinical Laboratory Improvement Amendments CLIA as qualified to perform high complexity clinical laboratory testing.    CEFTRIAXONE Value in next row Sensitive      <=4 SENSITIVEThis is a modified FDA-approved test that has been validated and its performance characteristics determined by the reporting laboratory.  This laboratory is certified under the Clinical Laboratory Improvement Amendments CLIA as qualified to perform high complexity clinical laboratory testing.    CIPROFLOXACIN Value in next row Sensitive      <=4 SENSITIVEThis is a modified FDA-approved test that has been validated and its performance characteristics determined by the reporting laboratory.  This laboratory is certified under the Clinical Laboratory Improvement Amendments CLIA as qualified to perform high complexity clinical laboratory testing.  GENTAMICIN Value in next row Sensitive       <=4 SENSITIVEThis is a modified FDA-approved test that has been validated and its performance characteristics determined by the reporting laboratory.  This laboratory is certified under the Clinical Laboratory Improvement Amendments CLIA as qualified to perform high complexity clinical laboratory testing.    MEROPENEM Value in next row Sensitive      <=4 SENSITIVEThis is a modified FDA-approved test that has been validated and its performance characteristics determined by the reporting laboratory.  This laboratory is certified under the Clinical Laboratory Improvement Amendments CLIA as qualified to perform high complexity clinical laboratory testing.    TRIMETH/SULFA Value in next row Sensitive      <=4 SENSITIVEThis is a modified FDA-approved test that has been validated and its performance characteristics determined by the reporting laboratory.  This laboratory is certified under the Clinical Laboratory Improvement Amendments CLIA as qualified to perform high complexity clinical laboratory testing.    AMPICILLIN/SULBACTAM Value in next row Sensitive      <=4 SENSITIVEThis is a modified FDA-approved test that has been validated and its performance characteristics determined by the reporting laboratory.  This laboratory is certified under the Clinical Laboratory Improvement Amendments CLIA as qualified to perform high complexity clinical laboratory testing.    PIP/TAZO Value in next row Sensitive ug/mL     <=4 SENSITIVEThis is a modified FDA-approved test that has been validated and its performance characteristics determined by the reporting laboratory.  This laboratory is certified under the Clinical Laboratory Improvement Amendments CLIA as qualified to perform high complexity clinical laboratory testing.    * RARE KLEBSIELLA PNEUMONIAE   Labs: CBC: Recent Labs  Lab 10/19/23 0442 10/21/23 0351 10/22/23 0213 10/22/23 1957 10/23/23 0311 10/24/23 0229  WBC 7.6 9.8 9.7 10.3 8.6 9.3  NEUTROABS  4.7  --   --   --   --   --   HGB 8.7* 8.7* 8.1* 9.9* 8.3* 8.7*  HCT 28.6* 28.5* 26.9* 32.6* 27.8* 28.7*  MCV 89.9 90.8 90.9 90.6 91.4 90.5  PLT 198 185 204 262 225 246   Basic Metabolic Panel: Recent Labs  Lab 10/19/23 0442 10/21/23 0351 10/22/23 0213 10/23/23 0311 10/24/23 0229  NA 137 133* 135 137 135  K 4.8 4.5 5.0 4.7 4.8  CL 104 103 104 101 104  CO2 26 24 25 25 25   GLUCOSE 134* 238* 121* 117* 131*  BUN 25* 26* 29* 30* 31*  CREATININE 1.25* 1.45* 1.38* 1.42* 1.66*  CALCIUM 8.5* 8.4* 8.6* 9.0 9.0  MG  --   --  1.9  --   --   PHOS  --   --  3.3  --   --    Liver Function Tests: No results for input(s): AST, ALT, ALKPHOS, BILITOT, PROT, ALBUMIN  in the last 168 hours. CBG: Recent Labs  Lab 10/23/23 1310 10/23/23 1616 10/23/23 2053 10/24/23 0811 10/24/23 1151  GLUCAP 127* 131* 168* 180* 152*    Discharge time spent: greater than 30 minutes.  Author: Yetta Blanch, MD  [facility] 10/24/2023

## 2023-10-27 ENCOUNTER — Other Ambulatory Visit: Payer: Self-pay

## 2023-10-27 ENCOUNTER — Inpatient Hospital Stay
Admission: EM | Admit: 2023-10-27 | Discharge: 2023-10-29 | DRG: 682 | Disposition: A | Discharge status: Other Acute Inpatient Hospital | Attending: Internal Medicine | Admitting: Internal Medicine

## 2023-10-27 DIAGNOSIS — E1142 Type 2 diabetes mellitus with diabetic polyneuropathy: Secondary | ICD-10-CM | POA: Diagnosis present

## 2023-10-27 DIAGNOSIS — G9341 Metabolic encephalopathy: Secondary | ICD-10-CM | POA: Diagnosis present

## 2023-10-27 DIAGNOSIS — R410 Disorientation, unspecified: Secondary | ICD-10-CM

## 2023-10-27 DIAGNOSIS — R531 Weakness: Secondary | ICD-10-CM

## 2023-10-27 DIAGNOSIS — N179 Acute kidney failure, unspecified: Principal | ICD-10-CM | POA: Diagnosis present

## 2023-10-27 DIAGNOSIS — I129 Hypertensive chronic kidney disease with stage 1 through stage 4 chronic kidney disease, or unspecified chronic kidney disease: Secondary | ICD-10-CM | POA: Diagnosis present

## 2023-10-27 DIAGNOSIS — L089 Local infection of the skin and subcutaneous tissue, unspecified: Principal | ICD-10-CM

## 2023-10-27 DIAGNOSIS — E1165 Type 2 diabetes mellitus with hyperglycemia: Secondary | ICD-10-CM | POA: Diagnosis present

## 2023-10-27 DIAGNOSIS — Z85028 Personal history of other malignant neoplasm of stomach: Secondary | ICD-10-CM

## 2023-10-27 DIAGNOSIS — L8990 Pressure ulcer of unspecified site, unspecified stage: Secondary | ICD-10-CM | POA: Insufficient documentation

## 2023-10-27 DIAGNOSIS — L89151 Pressure ulcer of sacral region, stage 1: Secondary | ICD-10-CM | POA: Diagnosis present

## 2023-10-27 DIAGNOSIS — Z87891 Personal history of nicotine dependence: Secondary | ICD-10-CM

## 2023-10-27 DIAGNOSIS — Z7984 Long term (current) use of oral hypoglycemic drugs: Secondary | ICD-10-CM

## 2023-10-27 DIAGNOSIS — Z86718 Personal history of other venous thrombosis and embolism: Secondary | ICD-10-CM

## 2023-10-27 DIAGNOSIS — Z888 Allergy status to other drugs, medicaments and biological substances status: Secondary | ICD-10-CM

## 2023-10-27 DIAGNOSIS — D631 Anemia in chronic kidney disease: Secondary | ICD-10-CM | POA: Diagnosis present

## 2023-10-27 DIAGNOSIS — J449 Chronic obstructive pulmonary disease, unspecified: Secondary | ICD-10-CM | POA: Diagnosis present

## 2023-10-27 DIAGNOSIS — L03115 Cellulitis of right lower limb: Secondary | ICD-10-CM | POA: Diagnosis not present

## 2023-10-27 DIAGNOSIS — E1122 Type 2 diabetes mellitus with diabetic chronic kidney disease: Secondary | ICD-10-CM | POA: Diagnosis present

## 2023-10-27 DIAGNOSIS — N189 Chronic kidney disease, unspecified: Secondary | ICD-10-CM | POA: Diagnosis present

## 2023-10-27 DIAGNOSIS — K219 Gastro-esophageal reflux disease without esophagitis: Secondary | ICD-10-CM | POA: Diagnosis present

## 2023-10-27 DIAGNOSIS — T361X5A Adverse effect of cephalosporins and other beta-lactam antibiotics, initial encounter: Secondary | ICD-10-CM | POA: Diagnosis present

## 2023-10-27 DIAGNOSIS — G928 Other toxic encephalopathy: Secondary | ICD-10-CM | POA: Diagnosis present

## 2023-10-27 DIAGNOSIS — D649 Anemia, unspecified: Secondary | ICD-10-CM | POA: Insufficient documentation

## 2023-10-27 DIAGNOSIS — I82409 Acute embolism and thrombosis of unspecified deep veins of unspecified lower extremity: Secondary | ICD-10-CM

## 2023-10-27 DIAGNOSIS — N1831 Chronic kidney disease, stage 3a: Secondary | ICD-10-CM | POA: Diagnosis present

## 2023-10-27 DIAGNOSIS — E871 Hypo-osmolality and hyponatremia: Secondary | ICD-10-CM | POA: Diagnosis present

## 2023-10-27 DIAGNOSIS — Z8249 Family history of ischemic heart disease and other diseases of the circulatory system: Secondary | ICD-10-CM

## 2023-10-27 DIAGNOSIS — T847XXA Infection and inflammatory reaction due to other internal orthopedic prosthetic devices, implants and grafts, initial encounter: Secondary | ICD-10-CM

## 2023-10-27 DIAGNOSIS — M6282 Rhabdomyolysis: Secondary | ICD-10-CM | POA: Diagnosis present

## 2023-10-27 DIAGNOSIS — E538 Deficiency of other specified B group vitamins: Secondary | ICD-10-CM | POA: Diagnosis present

## 2023-10-27 LAB — BASIC METABOLIC PANEL WITH GFR
Anion gap: 10 (ref 5–15)
BUN: 48 mg/dL — ABNORMAL HIGH (ref 8–23)
CO2: 19 mmol/L — ABNORMAL LOW (ref 22–32)
Calcium: 8.8 mg/dL — ABNORMAL LOW (ref 8.9–10.3)
Chloride: 104 mmol/L (ref 98–111)
Creatinine, Ser: 2.41 mg/dL — ABNORMAL HIGH (ref 0.61–1.24)
GFR, Estimated: 26 mL/min — ABNORMAL LOW (ref >60)
Glucose, Bld: 148 mg/dL — ABNORMAL HIGH (ref 70–99)
Potassium: 4.6 mmol/L (ref 3.5–5.1)
Sodium: 133 mmol/L — ABNORMAL LOW (ref 135–145)

## 2023-10-27 LAB — CBC
HCT: 28.4 % — ABNORMAL LOW (ref 39.0–52.0)
Hemoglobin: 8.6 g/dL — ABNORMAL LOW (ref 13.0–17.0)
MCH: 27.4 pg (ref 26.0–34.0)
MCHC: 30.3 g/dL (ref 30.0–36.0)
MCV: 90.4 fL (ref 80.0–100.0)
Platelets: 288 K/uL (ref 150–400)
RBC: 3.14 MIL/uL — ABNORMAL LOW (ref 4.22–5.81)
RDW: 16.2 % — ABNORMAL HIGH (ref 11.5–15.5)
WBC: 13.6 K/uL — ABNORMAL HIGH (ref 4.0–10.5)
nRBC: 0 % (ref 0.0–0.2)

## 2023-10-27 LAB — LACTIC ACID, PLASMA: Lactic Acid, Venous: 1.3 mmol/L (ref 0.5–1.9)

## 2023-10-27 MED ORDER — SODIUM CHLORIDE 0.9 % IV SOLN
2.0000 g | Freq: Once | INTRAVENOUS | Status: AC
  Administered 2023-10-27: 2 g via INTRAVENOUS
  Filled 2023-10-27: qty 12.5

## 2023-10-27 MED ORDER — VANCOMYCIN HCL 1500 MG/300ML IV SOLN
1500.0000 mg | Freq: Once | INTRAVENOUS | Status: AC
  Administered 2023-10-28: 1500 mg via INTRAVENOUS
  Filled 2023-10-27: qty 300

## 2023-10-27 MED ORDER — MORPHINE SULFATE (PF) 2 MG/ML IV SOLN
2.0000 mg | INTRAVENOUS | Status: DC | PRN
  Administered 2023-10-27: 2 mg via INTRAVENOUS
  Filled 2023-10-27: qty 1

## 2023-10-27 MED ORDER — SODIUM CHLORIDE 0.9 % IV SOLN: 2.0000 g | Freq: Once | INTRAVENOUS | Status: DC

## 2023-10-27 NOTE — Progress Notes (Signed)
 ED Pharmacy Antibiotic Sign Off An antibiotic consult was received from an ED provider for Vanc, cefepime  per pharmacy dosing for wound infection. A chart review was completed to assess appropriateness.   The following one time order(s) were placed:  Vancomycin  1500 mg IV X 1 and Cefepime  2 gm IV X 1 .   Further antibiotic and/or antibiotic pharmacy consults should be ordered by the admitting provider if indicated.   Thank you for allowing pharmacy to be a part of this patient's care.   Silver Selinda [facility]  Clinical Pharmacist 10/27/23 10:04 PM

## 2023-10-27 NOTE — ED Provider Notes (Signed)
 [facility] Provider Note   Event Date/Time   First MD Initiated Contact with Patient 10/27/23 2125     (approximate) History  Wound Infection  HPI Joe Coleman is a 78 y.o. 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  Patient states that he has been having worsening pain in this right lower extremity.  Patient has not had his nighttime dose of antibiotics. Further history obtained from patient's niece who is acting as his caregiver stating that patient has had worsening generalized weakness as well as altered mental status including not answering questions appropriately. ROS: Patient currently denies any vision changes, tinnitus, difficulty speaking, facial droop, sore throat, chest pain, shortness of breath, abdominal pain, nausea/vomiting/diarrhea, dysuria, or numbness/paresthesias in any extremity   Physical Exam  Triage Vital Signs: ED Triage Vitals  Encounter Vitals Group     BP 10/27/23 1616 120/77     Girls Systolic BP Percentile --      Girls Diastolic BP Percentile --      Boys Systolic BP Percentile --      Boys Diastolic BP Percentile --      Pulse Rate 10/27/23 1616 93     Resp --      Temp 10/27/23 1616 98.2 F (36.8 C)     Temp Source 10/27/23 1616 Oral     SpO2 10/27/23 1616 97 %     Weight --      Height --      Head Circumference --      Peak Flow --      Pain Score 10/27/23 1601 4     Pain Loc --      Pain Education --      Exclude from Growth Chart --    Most recent vital signs: Vitals:   10/27/23 1616 10/27/23 2225  BP: 120/77 (!) 157/82  Pulse: 93 97  Resp:  (!) 22  Temp: 98.2 F (36.8 C) 98.1 F (36.7 C)  SpO2: 97% 100%   General: Awake, oriented x4. CV:  Good peripheral perfusion. Resp:  Normal effort. Abd:  No distention. Other:  Elderly  disheveled appearing Caucasian male resting comfortably in no acute distress.  Wound VAC in place to right lower extremity ED Results / Procedures / Treatments  Labs (all labs ordered are listed, but only abnormal results are displayed) Labs Reviewed  CBC - Abnormal; Notable for the following components:      Result Value   WBC 13.6 (*)    RBC 3.14 (*)    Hemoglobin 8.6 (*)    HCT 28.4 (*)    RDW 16.2 (*)    All other components within normal limits  BASIC METABOLIC PANEL WITH GFR - Abnormal; Notable for the following components:   Sodium 133 (*)    CO2 19 (*)    Glucose, Bld 148 (*)    BUN 48 (*)    Creatinine, Ser 2.41 (*)    Calcium 8.8 (*)    GFR, Estimated 26 (*)    All other components within normal limits  CULTURE, BLOOD (ROUTINE X 2)  CULTURE, BLOOD (ROUTINE X 2)  LACTIC ACID, PLASMA  PROCEDURES: Critical Care performed: No Procedures MEDICATIONS ORDERED IN ED: Medications  vancomycin  (VANCOREADY) IVPB 1500 mg/300 mL (has no administration in time range)  ceFEPIme  (MAXIPIME ) 2 g in sodium chloride  0.9 % 100 mL IVPB (2 g Intravenous New Bag/Given 10/27/23 2250)  morphine  (PF) 2 MG/ML injection 2 mg (2 mg Intravenous Given 10/27/23 2253)   IMPRESSION / MDM / ASSESSMENT AND PLAN / ED COURSE  I reviewed the triage vital signs and the nursing notes.                             The patient is on the cardiac monitor to evaluate for evidence of arrhythmia and/or significant heart rate changes. Patient's presentation is most consistent with acute presentation with potential threat to life or bodily function. Patient is an 78 year old male with the above-stated past medical history who presents for worsening pain over the right lower extremity wound, increased generalized weakness, and altered mental status DDx: Antibiotic failure, worsening cellulitis, metabolic encephalopathy, sepsis Plan: CBC concerning for leukocytosis of 13 (up from 8, 3 days ago) BMP concerning for  worsening renal function Cr 1.66-2.41 Lactic acid negative Blood cultures in progress  Given patient has worsening leukocytosis, worsening renal function, and altered mental status, he will require admission internal medicine service for further evaluation and management.  I spoke to Dr. Arcadio who agrees with this plan.  I spoke to patient and patient's niece who also agree with this plan and all questions were answered  Dispo: Admit to medicine    FINAL CLINICAL IMPRESSION(S) / ED DIAGNOSES   Final diagnoses:  Wound infection  Disorientation  Generalized weakness   Rx / DC Orders   ED Discharge Orders     None      Note:  This document was prepared using Dragon voice recognition software and may include unintentional dictation errors.   Jossie Artist POUR, MD 10/27/23 [phone]

## 2023-10-27 NOTE — ED Triage Notes (Addendum)
 Pt comes via EMS from home with c/o wound on his buttocks. Pt  was just seen here on the 9th for the wound to his leg. Pt is diabetic

## 2023-10-28 ENCOUNTER — Inpatient Hospital Stay

## 2023-10-28 DIAGNOSIS — Z87891 Personal history of nicotine dependence: Secondary | ICD-10-CM | POA: Diagnosis not present

## 2023-10-28 DIAGNOSIS — Z888 Allergy status to other drugs, medicaments and biological substances status: Secondary | ICD-10-CM | POA: Diagnosis not present

## 2023-10-28 DIAGNOSIS — K219 Gastro-esophageal reflux disease without esophagitis: Secondary | ICD-10-CM

## 2023-10-28 DIAGNOSIS — E1142 Type 2 diabetes mellitus with diabetic polyneuropathy: Secondary | ICD-10-CM | POA: Diagnosis present

## 2023-10-28 DIAGNOSIS — E1165 Type 2 diabetes mellitus with hyperglycemia: Secondary | ICD-10-CM | POA: Diagnosis present

## 2023-10-28 DIAGNOSIS — M6282 Rhabdomyolysis: Secondary | ICD-10-CM

## 2023-10-28 DIAGNOSIS — N1831 Chronic kidney disease, stage 3a: Secondary | ICD-10-CM | POA: Diagnosis present

## 2023-10-28 DIAGNOSIS — Z7984 Long term (current) use of oral hypoglycemic drugs: Secondary | ICD-10-CM | POA: Diagnosis not present

## 2023-10-28 DIAGNOSIS — L03115 Cellulitis of right lower limb: Secondary | ICD-10-CM | POA: Diagnosis present

## 2023-10-28 DIAGNOSIS — R4182 Altered mental status, unspecified: Secondary | ICD-10-CM | POA: Diagnosis not present

## 2023-10-28 DIAGNOSIS — N179 Acute kidney failure, unspecified: Secondary | ICD-10-CM | POA: Diagnosis present

## 2023-10-28 DIAGNOSIS — Z85028 Personal history of other malignant neoplasm of stomach: Secondary | ICD-10-CM | POA: Diagnosis not present

## 2023-10-28 DIAGNOSIS — Z86718 Personal history of other venous thrombosis and embolism: Secondary | ICD-10-CM

## 2023-10-28 DIAGNOSIS — L8990 Pressure ulcer of unspecified site, unspecified stage: Secondary | ICD-10-CM | POA: Insufficient documentation

## 2023-10-28 DIAGNOSIS — E1122 Type 2 diabetes mellitus with diabetic chronic kidney disease: Secondary | ICD-10-CM | POA: Diagnosis present

## 2023-10-28 DIAGNOSIS — E871 Hypo-osmolality and hyponatremia: Secondary | ICD-10-CM | POA: Diagnosis present

## 2023-10-28 DIAGNOSIS — D631 Anemia in chronic kidney disease: Secondary | ICD-10-CM | POA: Diagnosis present

## 2023-10-28 DIAGNOSIS — A498 Other bacterial infections of unspecified site: Secondary | ICD-10-CM | POA: Diagnosis not present

## 2023-10-28 DIAGNOSIS — M868X6 Other osteomyelitis, lower leg: Secondary | ICD-10-CM | POA: Diagnosis not present

## 2023-10-28 DIAGNOSIS — Z8249 Family history of ischemic heart disease and other diseases of the circulatory system: Secondary | ICD-10-CM | POA: Diagnosis not present

## 2023-10-28 DIAGNOSIS — T361X5A Adverse effect of cephalosporins and other beta-lactam antibiotics, initial encounter: Secondary | ICD-10-CM | POA: Diagnosis present

## 2023-10-28 DIAGNOSIS — J449 Chronic obstructive pulmonary disease, unspecified: Secondary | ICD-10-CM | POA: Diagnosis present

## 2023-10-28 DIAGNOSIS — I129 Hypertensive chronic kidney disease with stage 1 through stage 4 chronic kidney disease, or unspecified chronic kidney disease: Secondary | ICD-10-CM | POA: Diagnosis present

## 2023-10-28 DIAGNOSIS — G928 Other toxic encephalopathy: Secondary | ICD-10-CM | POA: Diagnosis present

## 2023-10-28 DIAGNOSIS — T84629A Infection and inflammatory reaction due to internal fixation device of unspecified bone of leg, initial encounter: Secondary | ICD-10-CM | POA: Diagnosis not present

## 2023-10-28 DIAGNOSIS — I82409 Acute embolism and thrombosis of unspecified deep veins of unspecified lower extremity: Secondary | ICD-10-CM

## 2023-10-28 DIAGNOSIS — E538 Deficiency of other specified B group vitamins: Secondary | ICD-10-CM | POA: Diagnosis present

## 2023-10-28 DIAGNOSIS — R410 Disorientation, unspecified: Secondary | ICD-10-CM | POA: Diagnosis present

## 2023-10-28 DIAGNOSIS — I82402 Acute embolism and thrombosis of unspecified deep veins of left lower extremity: Secondary | ICD-10-CM

## 2023-10-28 DIAGNOSIS — L89151 Pressure ulcer of sacral region, stage 1: Secondary | ICD-10-CM | POA: Diagnosis present

## 2023-10-28 DIAGNOSIS — N189 Chronic kidney disease, unspecified: Secondary | ICD-10-CM | POA: Diagnosis not present

## 2023-10-28 DIAGNOSIS — G9341 Metabolic encephalopathy: Secondary | ICD-10-CM | POA: Diagnosis not present

## 2023-10-28 DIAGNOSIS — D649 Anemia, unspecified: Secondary | ICD-10-CM | POA: Insufficient documentation

## 2023-10-28 LAB — BASIC METABOLIC PANEL WITH GFR
Anion gap: 7 (ref 5–15)
BUN: 52 mg/dL — ABNORMAL HIGH (ref 8–23)
CO2: 21 mmol/L — ABNORMAL LOW (ref 22–32)
Calcium: 8.5 mg/dL — ABNORMAL LOW (ref 8.9–10.3)
Chloride: 109 mmol/L (ref 98–111)
Creatinine, Ser: 2.65 mg/dL — ABNORMAL HIGH (ref 0.61–1.24)
GFR, Estimated: 24 mL/min — ABNORMAL LOW (ref >60)
Glucose, Bld: 104 mg/dL — ABNORMAL HIGH (ref 70–99)
Potassium: 4.4 mmol/L (ref 3.5–5.1)
Sodium: 137 mmol/L (ref 135–145)

## 2023-10-28 LAB — CBC
HCT: 28.9 % — ABNORMAL LOW (ref 39.0–52.0)
Hemoglobin: 8.6 g/dL — ABNORMAL LOW (ref 13.0–17.0)
MCH: 27.3 pg (ref 26.0–34.0)
MCHC: 29.8 g/dL — ABNORMAL LOW (ref 30.0–36.0)
MCV: 91.7 fL (ref 80.0–100.0)
Platelets: 249 K/uL (ref 150–400)
RBC: 3.15 MIL/uL — ABNORMAL LOW (ref 4.22–5.81)
RDW: 16.5 % — ABNORMAL HIGH (ref 11.5–15.5)
WBC: 12.2 K/uL — ABNORMAL HIGH (ref 4.0–10.5)
nRBC: 0 % (ref 0.0–0.2)

## 2023-10-28 LAB — GLUCOSE, CAPILLARY
Glucose-Capillary: 135 mg/dL — ABNORMAL HIGH (ref 70–99)
Glucose-Capillary: 129 mg/dL — ABNORMAL HIGH (ref 70–99)

## 2023-10-28 LAB — CK: Total CK: 1699 U/L — ABNORMAL HIGH (ref 49–397)

## 2023-10-28 MED ORDER — FERROUS SULFATE 325 (65 FE) MG PO TABS
325.0000 mg | ORAL_TABLET | Freq: Every day | ORAL | Status: DC
  Administered 2023-10-28 – 2023-10-29 (×2): 325 mg via ORAL
  Filled 2023-10-28 (×2): qty 1

## 2023-10-28 MED ORDER — APIXABAN 5 MG PO TABS
10.0000 mg | ORAL_TABLET | Freq: Two times a day (BID) | ORAL | Status: DC
  Administered 2023-10-28 (×2): 10 mg via ORAL
  Filled 2023-10-28 (×2): qty 2

## 2023-10-28 MED ORDER — METHOCARBAMOL 500 MG PO TABS
500.0000 mg | ORAL_TABLET | Freq: Four times a day (QID) | ORAL | Status: DC | PRN
  Administered 2023-10-29: 500 mg via ORAL
  Filled 2023-10-28: qty 1

## 2023-10-28 MED ORDER — SODIUM CHLORIDE 0.9 % IV SOLN
2.0000 g | INTRAVENOUS | Status: DC
  Administered 2023-10-29: 2 g via INTRAVENOUS
  Filled 2023-10-28: qty 12.5

## 2023-10-28 MED ORDER — VANCOMYCIN HCL IN DEXTROSE 1-5 GM/200ML-% IV SOLN
1000.0000 mg | Freq: Once | INTRAVENOUS | Status: DC
Start: 2023-10-28 — End: 2023-10-28

## 2023-10-28 MED ORDER — MAGNESIUM HYDROXIDE 400 MG/5ML PO SUSP: 30.0000 mL | Freq: Every day | ORAL | Status: DC | PRN

## 2023-10-28 MED ORDER — APIXABAN 5 MG PO TABS: 5.0000 mg | ORAL_TABLET | Freq: Two times a day (BID) | ORAL | Status: DC

## 2023-10-28 MED ORDER — SODIUM CHLORIDE 0.9 % IV SOLN
INTRAVENOUS | Status: DC
Start: 2023-10-28 — End: 2023-10-28

## 2023-10-28 MED ORDER — INSULIN ASPART 100 UNIT/ML IJ SOLN
0.0000 [IU] | Freq: Every day | INTRAMUSCULAR | Status: DC
  Filled 2023-10-28: qty 1

## 2023-10-28 MED ORDER — SODIUM CHLORIDE 0.9% FLUSH: 10.0000 mL | INTRAVENOUS | Status: DC | PRN

## 2023-10-28 MED ORDER — APIXABAN 5 MG PO TABS
10.0000 mg | ORAL_TABLET | Freq: Two times a day (BID) | ORAL | Status: DC
  Administered 2023-10-28 – 2023-10-29 (×2): 10 mg via ORAL
  Filled 2023-10-28 (×2): qty 2

## 2023-10-28 MED ORDER — GABAPENTIN 300 MG PO CAPS
300.0000 mg | ORAL_CAPSULE | Freq: Every day | ORAL | Status: DC
  Administered 2023-10-28: 300 mg via ORAL
  Filled 2023-10-28: qty 1

## 2023-10-28 MED ORDER — INSULIN ASPART 100 UNIT/ML IJ SOLN
0.0000 [IU] | Freq: Three times a day (TID) | INTRAMUSCULAR | Status: DC
  Administered 2023-10-28: 1 [IU] via SUBCUTANEOUS
  Administered 2023-10-29: 5 [IU] via SUBCUTANEOUS
  Filled 2023-10-28 (×2): qty 1

## 2023-10-28 MED ORDER — GUAIFENESIN ER 600 MG PO TB12
600.0000 mg | ORAL_TABLET | Freq: Two times a day (BID) | ORAL | Status: DC
  Administered 2023-10-28 – 2023-10-29 (×3): 600 mg via ORAL
  Filled 2023-10-28 (×3): qty 1

## 2023-10-28 MED ORDER — OXYCODONE-ACETAMINOPHEN 5-325 MG PO TABS
1.0000 | ORAL_TABLET | Freq: Four times a day (QID) | ORAL | Status: DC | PRN
Start: 2023-10-28 — End: 2023-10-29
  Administered 2023-10-29 (×2): 1 via ORAL
  Filled 2023-10-28 (×2): qty 1

## 2023-10-28 MED ORDER — CHLORHEXIDINE GLUCONATE CLOTH 2 % EX PADS
6.0000 | MEDICATED_PAD | Freq: Every day | CUTANEOUS | Status: DC
  Administered 2023-10-28 – 2023-10-29 (×2): 6 via TOPICAL

## 2023-10-28 MED ORDER — APIXABAN (ELIQUIS) VTE STARTER PACK (10MG AND 5MG): 5.0000 mg | ORAL_TABLET | Freq: Two times a day (BID) | ORAL | Status: DC

## 2023-10-28 MED ORDER — VITAMIN B-12 1000 MCG PO TABS
1000.0000 ug | ORAL_TABLET | Freq: Every day | ORAL | Status: DC
  Administered 2023-10-28 – 2023-10-29 (×2): 1000 ug via ORAL
  Filled 2023-10-28 (×2): qty 1

## 2023-10-28 MED ORDER — BISACODYL 5 MG PO TBEC: 5.0000 mg | DELAYED_RELEASE_TABLET | Freq: Every day | ORAL | Status: DC | PRN

## 2023-10-28 MED ORDER — VITAMIN D 25 MCG (1000 UNIT) PO TABS
1000.0000 [IU] | ORAL_TABLET | Freq: Every day | ORAL | Status: DC
  Administered 2023-10-28 – 2023-10-29 (×2): 1000 [IU] via ORAL
  Filled 2023-10-28 (×2): qty 1

## 2023-10-28 MED ORDER — VANCOMYCIN VARIABLE DOSE PER UNSTABLE RENAL FUNCTION (PHARMACIST DOSING): Status: DC

## 2023-10-28 MED ORDER — BUDESON-GLYCOPYRROL-FORMOTEROL 160-9-4.8 MCG/ACT IN AERO
2.0000 | INHALATION_SPRAY | Freq: Two times a day (BID) | RESPIRATORY_TRACT | Status: DC
  Administered 2023-10-28 – 2023-10-29 (×3): 2 via RESPIRATORY_TRACT
  Filled 2023-10-28: qty 5.9

## 2023-10-28 MED ORDER — ONDANSETRON HCL 4 MG PO TABS: 4.0000 mg | ORAL_TABLET | Freq: Four times a day (QID) | ORAL | Status: DC | PRN

## 2023-10-28 MED ORDER — DAPTOMYCIN-SODIUM CHLORIDE 500-0.9 MG/50ML-% IV SOLN
8.0000 mg/kg | INTRAVENOUS | Status: DC
Start: 2023-10-28 — End: 2023-10-28
  Filled 2023-10-28: qty 50

## 2023-10-28 MED ORDER — ACETAMINOPHEN 650 MG RE SUPP: 650.0000 mg | Freq: Four times a day (QID) | RECTAL | Status: DC | PRN

## 2023-10-28 MED ORDER — SODIUM CHLORIDE 0.9 % IV SOLN: 2.0000 g | Freq: Once | INTRAVENOUS | Status: DC

## 2023-10-28 MED ORDER — TRAZODONE HCL 50 MG PO TABS
25.0000 mg | ORAL_TABLET | Freq: Every evening | ORAL | Status: DC | PRN
  Administered 2023-10-29: 25 mg via ORAL
  Filled 2023-10-28: qty 1

## 2023-10-28 MED ORDER — SODIUM CHLORIDE 0.9% FLUSH
10.0000 mL | Freq: Two times a day (BID) | INTRAVENOUS | Status: DC
  Administered 2023-10-28: 10 mL
  Administered 2023-10-28: 20 mL
  Administered 2023-10-29: 10 mL

## 2023-10-28 MED ORDER — ALBUTEROL SULFATE (2.5 MG/3ML) 0.083% IN NEBU: 2.5000 mg | INHALATION_SOLUTION | Freq: Four times a day (QID) | RESPIRATORY_TRACT | Status: DC | PRN

## 2023-10-28 MED ORDER — GLUCERNA SHAKE PO LIQD
237.0000 mL | Freq: Three times a day (TID) | ORAL | Status: DC
Start: 2023-10-28 — End: 2023-10-29
  Administered 2023-10-28 – 2023-10-29 (×5): 237 mL via ORAL

## 2023-10-28 MED ORDER — APIXABAN 5 MG PO TABS
5.0000 mg | ORAL_TABLET | Freq: Two times a day (BID) | ORAL | Status: DC
Start: 2023-10-28 — End: 2023-10-28

## 2023-10-28 MED ORDER — POLYETHYLENE GLYCOL 3350 17 G PO PACK
17.0000 g | PACK | Freq: Every day | ORAL | Status: DC
  Administered 2023-10-28 – 2023-10-29 (×2): 17 g via ORAL
  Filled 2023-10-28 (×2): qty 1

## 2023-10-28 MED ORDER — LACTATED RINGERS IV SOLN: INTRAVENOUS | Status: DC

## 2023-10-28 MED ORDER — ONDANSETRON HCL 4 MG/2ML IJ SOLN: 4.0000 mg | Freq: Four times a day (QID) | INTRAMUSCULAR | Status: DC | PRN

## 2023-10-28 MED ORDER — ACETAMINOPHEN 325 MG PO TABS: 650.0000 mg | ORAL_TABLET | Freq: Four times a day (QID) | ORAL | Status: DC | PRN

## 2023-10-28 NOTE — Plan of Care (Signed)
?  Problem: Clinical Measurements: ?Goal: Respiratory complications will improve ?Outcome: Progressing ?Goal: Cardiovascular complication will be avoided ?Outcome: Progressing ?  ?Problem: Nutrition: ?Goal: Adequate nutrition will be maintained ?Outcome: Progressing ?  ?Problem: Coping: ?Goal: Level of anxiety will decrease ?Outcome: Progressing ?  ?Problem: Elimination: ?Goal: Will not experience complications related to bowel motility ?Outcome: Progressing ?Goal: Will not experience complications related to urinary retention ?Outcome: Progressing ?  ?

## 2023-10-28 NOTE — Assessment & Plan Note (Signed)
 Hemoglobin seems stable. - Continue home B12 and iron  supplement

## 2023-10-28 NOTE — H&P (Addendum)
    PATIENT NAME: Joe Coleman    MR#:  [id number]  DATE OF BIRTH:  03-15-1945  DATE OF ADMISSION:  10/27/2023  PRIMARY CARE PHYSICIAN: [facility]   Patient is coming from: Home  REQUESTING/REFERRING PHYSICIAN: Jossie Birmingham, MD  CHIEF COMPLAINT:   Chief Complaint  Patient presents with   Wound Infection    HISTORY OF PRESENT ILLNESS:  Joe Coleman is a 78 y.o. Caucasian male with medical history significant for type 2 diabetes mellitus and hypertension, recent history of right lower extremity hardware infection with open wound and wound VAC in place at this time as well as PICC line, on IV cefepime  and daptomycin  on IV daptomycin  and cefepime  as well as history of colon and stomach cancer, who presented to the emergency room with acute onset of generalized weakness as well as altered mental status with associated tactile fever and chills.  Denied any nausea or vomiting or diarrhea or abdominal pain.  No cough or wheezing or dyspnea.  No chest pain or palpitations.  No dysuria, oliguria or hematuria or flank pain.  He has been having worsening pain in the right lower extremity.  He has not been answering questions appropriately with worsening altered mental status.  ED Course: When the patient came to the ER, vital signs were within normal and later BP was 157/82 and heart rate was 93-97.  Labs revealed mild hyponatremia 133 and CO2 of 19 with a blood glucose 148, BUN 48 and creatinine 2.41 and calcium 8.8.  CBC showed leukocytosis of 13.6 with hemoglobin 8.6 hematocrit 20.4. EKG as reviewed by me : None Imaging: None.  The patient was given IV vancomycin  and cefepime .  He will be admitted to a medical telemetry observation bed for further evaluation and management. PAST MEDICAL HISTORY:   Past Medical History:  Diagnosis Date   Cancer (HCC)    stomach and colon   Diabetes mellitus without complication (HCC)    Type II   Hypertension      PAST SURGICAL HISTORY:   Past Surgical History:  Procedure Laterality Date   APPENDECTOMY     COLON SURGERY     HARDWARE REMOVAL Right 10/15/2023   Procedure: REMOVAL, HARDWARE;  Surgeon: [name] Franky SQUIBB, MD;  Location: [facility] OR;  Service: Orthopedics;  Laterality: Right;  IRRIGATION AND DEBRIDEMENT, HARDWARE REMOVAL   ORIF TIBIA FRACTURE Right 08/25/2023   Procedure: OPEN REDUCTION INTERNAL FIXATION (ORIF) TIBIA FRACTURE, RIGHT;  Surgeon: [name] Franky SQUIBB, MD;  Location: [facility] OR;  Service: Orthopedics;  Laterality: Right;    SOCIAL HISTORY:   Social History   Tobacco Use   Smoking status: Former   Smokeless tobacco: Never  Substance Use Topics   Alcohol use: No    FAMILY HISTORY:   Family History  Problem Relation Age of Onset   Heart disease Mother    Heart disease Brother     DRUG ALLERGIES:   Allergies  Allergen Reactions   Losartan Other (See Comments)    Hyperkalemia (>5.5 multiple times on losartan 25mg )   Chicken Allergy Nausea And Vomiting        Poultry Meal Nausea And Vomiting    REVIEW OF SYSTEMS:   ROS As per history of present illness. All pertinent systems were reviewed above. Constitutional, HEENT, cardiovascular, respiratory, GI, GU, musculoskeletal, neuro, psychiatric, endocrine, integumentary and hematologic systems were reviewed and are otherwise negative/unremarkable except for positive findings mentioned above in the HPI.  MEDICATIONS AT HOME:   Prior to Admission medications   Medication Sig Start Date End Date Taking? Authorizing Provider  albuterol  (PROVENTIL  HFA;VENTOLIN  HFA) 108 (90 Base) MCG/ACT inhaler Inhale 2 puffs into the lungs every 6 (six) hours as needed for wheezing or shortness of breath. 04/01/16  Yes Lang Dover, MD  APIXABAN  (ELIQUIS ) VTE STARTER PACK (10MG  AND 5MG ) Take as directed on package: start with two-5mg  tablets twice daily for 7 days. On day 8, switch to one-5mg  tablet twice daily. 10/24/23  Yes Tobie Yetta HERO, MD  bisacodyl  (DULCOLAX) 5 MG EC tablet Take 1 tablet (5 mg total) by mouth daily as needed for moderate constipation. 10/24/23  Yes Tobie Yetta HERO, MD  ceFEPime  (MAXIPIME ) IVPB Inject 2 g into the vein every 12 (twelve) hours. Indication:  RIGHT TIBIAL INFECTION First Dose: Yes Last Day of Therapy:  11/25/2023 Labs - Once weekly:  CBC/D and BMP, Labs - Once weekly: ESR and CRP Method of administration: IV Push Method of administration may be changed at the discretion of home infusion pharmacist based upon assessment of the patient and/or caregiver's ability to self-administer the medication ordered. 10/24/23 12/02/23 Yes Tobie Yetta HERO, MD  daptomycin  (CUBICIN ) IVPB Inject 500 mg into the vein daily. Indication:  RIGHT TIBIAL INFECTION First Dose: Yes Last Day of Therapy:  11/25/2023 Labs - Once weekly:  CBC/D, BMP, and CPK Labs - Once weekly: ESR and CRP Method of administration: IV Push Method of administration may be changed at the discretion of home infusion pharmacist based upon assessment of the patient and/or caregiver's ability to self-administer the medication ordered. 10/24/23 12/02/23 Yes Tobie Yetta HERO, MD  ferrous sulfate  325 (65 FE) MG tablet Take 1 tablet (325 mg total) by mouth daily with breakfast. 10/25/23  Yes Tobie Yetta HERO, MD  gabapentin  (NEURONTIN ) 300 MG capsule Take 300 mg by mouth at bedtime.   Yes [provider]  metFORMIN  (GLUCOPHAGE ) 500 MG tablet Take 500 mg by mouth 2 (two) times daily with a meal.   Yes [provider]  methocarbamol  (ROBAXIN ) 500 MG tablet Take 1 tablet (500 mg total) by mouth every 6 (six) hours as needed for muscle spasms. 10/24/23  Yes Tobie Yetta HERO, MD  oxyCODONE -acetaminophen  (PERCOCET) 5-325 MG tablet Take 1 tablet by mouth every 6 (six) hours as needed for severe pain (pain score 7-10) or moderate pain (pain score 4-6). 10/24/23 10/23/24 Yes Tobie Yetta HERO, MD  TRELEGY RUFINO 200-62.5-25 MCG/ACT AEPB Inhale 1 puff  into the lungs daily. 07/17/23  Yes [provider]  cholecalciferol  (CHOLECALCIFEROL ) 25 MCG tablet Take 1 tablet (1,000 Units total) by mouth daily. 08/28/23   Danton Lauraine LABOR, PA-C  cyanocobalamin  (VITAMIN B12) 1000 MCG tablet Take 1 tablet (1,000 mcg total) by mouth daily. 10/24/23   Tobie Yetta HERO, MD  feeding supplement, GLUCERNA SHAKE, (GLUCERNA SHAKE) LIQD Take 237 mLs by mouth 3 (three) times daily between meals. 09/01/23   Sherrill Cable Latif, DO  guaiFENesin  (MUCINEX ) 600 MG 12 hr tablet Take 1 tablet (600 mg total) by mouth 2 (two) times daily. 10/24/23 10/23/24  Tobie Yetta HERO, MD  ipratropium-albuterol  (DUONEB) 0.5-2.5 (3) MG/3ML SOLN Take 3 mLs by nebulization every 4 (four) hours as needed. Patient not taking: Reported on 10/14/2023 09/01/23   Sherrill Cable Latif, DO  nicotine  (NICODERM CQ  - DOSED IN MG/24 HOURS) 14 mg/24hr patch Place 1 patch (14 mg total) onto the skin daily as needed (tobacco dependence). Patient not taking: Reported on 10/27/2023  10/24/23   Tobie Yetta HERO, MD  pantoprazole  (PROTONIX ) 40 MG tablet Take 40 mg by mouth daily. Patient not taking: Reported on 10/27/2023    [provider]  polyethylene glycol (MIRALAX  / GLYCOLAX ) 17 g packet Take 17 g by mouth daily. 09/01/23   Sheikh, Omair Latif, DO  senna-docusate (SENOKOT-S) 8.6-50 MG tablet Take 1 tablet by mouth at bedtime. Patient not taking: Reported on 10/27/2023 09/01/23   Sherrill Cable Latif, DO      VITAL SIGNS:  Blood pressure 130/66, pulse 80, temperature 98.2 F (36.8 C), resp. rate 18, SpO2 98%.  PHYSICAL EXAMINATION:  Physical Exam  GENERAL:  78 y.o.-year-old Caucasian male patient lying in the bed with no acute distress.  EYES: Pupils equal, round, reactive to light and accommodation. No scleral icterus. Extraocular muscles intact.  HEENT: Head atraumatic, normocephalic. Oropharynx and nasopharynx clear.  NECK:  Supple, no jugular venous distention. No thyroid enlargement, no  tenderness.  LUNGS: Normal breath sounds bilaterally, no wheezing, rales,rhonchi or crepitation. No use of accessory muscles of respiration.  CARDIOVASCULAR: Regular rate and rhythm, S1, S2 normal. No murmurs, rubs, or gallops.  ABDOMEN: Soft, nondistended, nontender. Bowel sounds present. No organomegaly or mass.  EXTREMITIES: No pedal edema, cyanosis, or clubbing.  Right leg splinted and dressed with wound VAC. NEUROLOGIC: Cranial nerves II through XII are intact. Muscle strength 5/5 in all extremities. Sensation intact. Gait not checked.  PSYCHIATRIC: The patient is alert and oriented x 3.  Normal affect and good eye contact. SKIN: No obvious rash, lesion, or ulcer.   LABORATORY PANEL:   CBC Recent Labs  Lab 10/28/23 0246  WBC 12.2*  HGB 8.6*  HCT 28.9*  PLT 249   ------------------------------------------------------------------------------------------------------------------  Chemistries  Recent Labs  Lab 10/22/23 0213 10/23/23 0311 10/28/23 0246  NA 135   < > 137  K 5.0   < > 4.4  CL 104   < > 109  CO2 25   < > 21*  GLUCOSE 121*   < > 104*  BUN 29*   < > 52*  CREATININE 1.38*   < > 2.65*  CALCIUM 8.6*   < > 8.5*  MG 1.9  --   --    < > = values in this interval not displayed.   ------------------------------------------------------------------------------------------------------------------  Cardiac Enzymes No results for input(s): TROPONINI in the last 168 hours. ------------------------------------------------------------------------------------------------------------------  RADIOLOGY:  No results found.    IMPRESSION AND PLAN:  Assessment and Plan: * Acute metabolic encephalopathy - This is associated with generalized weakness and is likely likely secondary to acute kidney injury superimposed on stage IIIa chronic kidney disease. - The patient will be admitted to a medical telemetry observation bed. - Will continue antibiotic therapy with IV  vancomycin  and cefepime  for his right leg recently infected wound. - Will continue hydration with IV normal saline and follow BMP. - Will avoid nephrotoxins.  Cellulitis of right leg - This  is due to infected wound status post operative intervention and washout. - Will continue IV vancomycin  and cefepime . - Wound care consult to be obtained  Chronic obstructive pulmonary disease (COPD) (HCC) - Will continue DuoNebs and Trelegy.  GERD without esophagitis - Will continue PPI therapy.  Type 2 diabetes mellitus with peripheral neuropathy (HCC) - The will be placed on supplemental coverage with NovoLog . - Will hold off metformin . - Will continue Neurontin .   DVT prophylaxis: Lovenox .  Advanced Care Planning:  Code Status: full code.  Family Communication:  The plan  of care was discussed in details with the patient (and family). I answered all questions. The patient agreed to proceed with the above mentioned plan. Further management will depend upon hospital course. Disposition Plan: Back to previous home environment Consults called: none.  All the records are reviewed and case discussed with ED provider.  Status is: Observation  I certify that at the time of admission, it is my clinical judgment that the patient will require hospital care extending less than 2 midnights.                            Dispo: The patient is from: Home              Anticipated d/c is to: Home              Patient currently is not medically stable to d/c.              Difficult to place patient: No  Madison DELENA Peaches M.D on 10/28/2023 at 6:30 AM  [facility]   From 7 PM-7 AM, contact night-coverage [email or web address]  CC: Primary care physician; [employer]

## 2023-10-28 NOTE — Assessment & Plan Note (Addendum)
 Patient was on metformin  at home -SSI for now - Will continue Neurontin .

## 2023-10-28 NOTE — Hospital Course (Addendum)
 Partly taken from H&P.  Joe Coleman is a 78 y.o. 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   On presentation stable vitals, labs with mild hyponatremia at 133, CO2 of 19, BUN 48, creatinine 2.41, leukocytosis at 13.6, hemoglobin 8.6.  9/23: Blood pressure mildly elevated, slight worsening of creatinine to 2.65, hyponatremia resolved and some improvement of CO2 to 21.  CK at 1699.  Mentation improving and at baseline Renal ultrasound ordered. Bladder scan with 200 cc Daptomycin  was discontinued and cefepime  dose was adjusted according to renal function. PT and OT evaluation ordered  Recent hospitalization with recommendation to go to SNF but patient decided to go home, a 39 year old sister is caregiver.  9/24.  Case discussed with infectious disease specialist and changed antibiotics over to IV Invanz .  Stopped Maxipime  secondary to altered mental status.  CT head ordered.  Patient with rhabdomyolysis continue IV fluids.  Hold daptomycin  secondary to rhabdomyolysis.  Case discussed with [name] orthopedic surgery at [facility] and wanted to evaluate him at [facility] so we can look at his wounds and cast.

## 2023-10-28 NOTE — Assessment & Plan Note (Addendum)
 No concern of exacerbation - Will continue DuoNebs and Trelegy.

## 2023-10-28 NOTE — Assessment & Plan Note (Addendum)
 He is history of recent infected hardware with admission at [facility], status post operative intervention and washout. Patient was discharged home with cefepime  and daptomycin  Cultures grew Enterobacter cloacae and Klebsiella pneumonia, both sensitive to cefepime  -Adjusting the dose of cefepime  based on renal function -Discontinuing daptomycin  due to AKI - Wound care consult

## 2023-10-28 NOTE — Assessment & Plan Note (Signed)
-   Will continue PPI therapy.

## 2023-10-28 NOTE — Assessment & Plan Note (Addendum)
 This is associated with generalized weakness, mentation now seems to be at baseline.  Patient was recently discharged home at his request with wound VAC and IV antibiotics with cefepime  and daptomycin  Cefepime  can also cause some mental confusion specially when superimposed with AKI. -Cefepime  dose was adjusted -Based on his prior culture results no need for vancomycin  or daptomycin  so it was discontinued. - Continue to monitor - PT/OT evaluation ordered

## 2023-10-28 NOTE — Assessment & Plan Note (Signed)
 CK at 1699 -Continue with IV fluid -Monitor CK

## 2023-10-28 NOTE — Progress Notes (Signed)
 Progress Note   Patient: Joe Coleman FMW:[id number] DOB: 11/01/1945 DOA: 10/27/2023     0 DOS: the patient was seen and examined on 10/28/2023   Brief hospital course: Partly taken from H&P.  Joe Coleman is a 78 y.o. 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   On presentation stable vitals, labs with mild hyponatremia at 133, CO2 of 19, BUN 48, creatinine 2.41, leukocytosis at 13.6, hemoglobin 8.6.  9/23: Blood pressure mildly elevated, slight worsening of creatinine to 2.65, hyponatremia resolved and some improvement of CO2 to 21.  CK at 1699.  Mentation improving and at baseline Renal ultrasound ordered. Bladder scan with 200 cc Daptomycin  was discontinued and cefepime  dose was adjusted according to renal function. PT and OT evaluation ordered  Recent hospitalization with recommendation to go to SNF but patient decided to go home, a 8 year old sister is caregiver.  Assessment and Plan: * Acute metabolic encephalopathy This is associated with generalized weakness, mentation now seems to be at baseline.  Patient was recently discharged home at his request with wound VAC and IV antibiotics with cefepime  and daptomycin  Cefepime  can also cause some mental confusion specially when superimposed with AKI. -Cefepime  dose was adjusted -Based on his prior culture results no need for vancomycin  or daptomycin  so it was discontinued. - Continue to monitor - PT/OT evaluation ordered  Acute kidney injury superimposed on chronic kidney disease Underlying CKD stage III A Slight  worsening of creatinine to 2.65 with baseline around 1.3-1.6.  Mild hyponatremia has been improved. Also found to have elevated CK. -Renal ultrasound ordered -Continue with IV fluid - Monitor renal function -Avoid nephrotoxins  Rhabdomyolysis CK at 1699 -Continue with IV fluid -Monitor CK  Cellulitis of right leg He is history of recent infected hardware with admission at [facility], status post operative intervention and washout. Patient was discharged home with cefepime  and daptomycin  Cultures grew Enterobacter cloacae and Klebsiella pneumonia, both sensitive to cefepime  -Adjusting the dose of cefepime  based on renal function -Discontinuing daptomycin  due to AKI - Wound care consult   DVT (deep venous thrombosis) (HCC) Patient was found to have acute left leg distal DVT during most recent hospitalization and was started on Eliquis  -Continue with Eliquis   Type 2 diabetes mellitus with peripheral neuropathy (HCC) Patient was on metformin  at home -SSI for now - Will continue Neurontin .  Chronic obstructive pulmonary disease (COPD) (HCC) No concern of exacerbation - Will continue DuoNebs and Trelegy.  GERD without esophagitis - Will continue PPI therapy.  Normocytic anemia Hemoglobin seems stable. - Continue home B12 and iron  supplement  Pressure injury of skin Wound 10/28/23 0024 Pressure Injury Coccyx Mid Stage 1 -  Intact skin with non-blanchable redness of a localized area usually over a bony prominence. (Active)   - Wound care   Yeah   Subjective: Patient was getting his wound VAC exchange when seen today.  No significant underlying erythema.  Patient is feeling much improved and mentation seems to be at baseline.  Physical Exam: Vitals:   10/27/23 2225 10/28/23 0006 10/28/23 0500 10/28/23 0734  BP: (!) 157/82 (!) 146/85  130/66 (!) 150/87  Pulse: 97 94 80 94  Resp: (!) 22 18 18 18   Temp: 98.1 F (36.7 C) 98.6 F (37 C) 98.2 F (36.8 C) (!) 97.5 F (36.4  C)  TempSrc: Oral     SpO2: 100% 98% 98% 99%   General. Frail and malnourished elderly man, In no acute distress. Pulmonary.  Lungs clear bilaterally, normal respiratory effort. CV.  Regular rate and rhythm, no JVD, rub or murmur. Abdomen.  Soft, nontender, nondistended, BS positive. CNS.  Alert and oriented .  No focal neurologic deficit. Extremities.  No edema, no cyanosis, pulses intact and symmetrical.   Data Reviewed: Prior data reviewed  Family Communication: Discussed with niece on phone, his sister is unable to take care of him and he will need placement per niece.  Disposition: Status is: Inpatient Remains inpatient appropriate because: Severity of illness  Planned Discharge Destination: Skilled nursing facility  DVT prophylaxis.  Eliquis  Time spent: 50 minutes  This record has been created using Conservation officer, historic buildings. Errors have been sought and corrected,but may not always be located. Such creation errors do not reflect on the standard of care.   Author: Amaryllis Dare, MD 10/28/2023 2:47 PM  For on call review [email or web address].

## 2023-10-28 NOTE — Progress Notes (Signed)
 Pharmacy Antibiotic Note  Joe Coleman is a 78 y.o. male admitted on 10/27/2023 with cellulitis.  Pharmacy has been consulted for Vanc, Cefepime  dosing.  Pt appears to be in AKI,  will dose Vanc by levels til renal function improves.   Plan: Cefepime  2 gm IV X 1 given in ED on 9/22 @ 2250. Cefepime  2 gm IV Q24H ordered 9/23 @ 2300.   Vancomycin  1500 mg IV X 1 given on 9/23 @ 0024. - Will draw Vanc trough on 9/24 @ 0000 - Goal trough :  15 - 20 mcg/mL      Temp (24hrs), Avg:98.3 F (36.8 C), Min:98.1 F (36.7 C), Max:98.6 F (37 C)  Recent Labs  Lab 10/21/23 0351 10/22/23 0213 10/22/23 1957 10/23/23 0311 10/24/23 0229 10/27/23 1603 10/27/23 1615  WBC 9.8 9.7 10.3 8.6 9.3 13.6*  --   CREATININE 1.45* 1.38*  --  1.42* 1.66* 2.41*  --   LATICACIDVEN  --   --   --   --   --   --  1.3    Estimated Creatinine Clearance: 22.8 mL/min (A) (by C-G formula based on SCr of 2.41 mg/dL (H)).    Allergies  Allergen Reactions   Losartan Other (See Comments)    Hyperkalemia (>5.5 multiple times on losartan 25mg )   Chicken Allergy Nausea And Vomiting        Poultry Meal Nausea And Vomiting    Antimicrobials this admission:   >>    >>   Dose adjustments this admission:   Microbiology results:  BCx:   UCx:    Sputum:    MRSA PCR:   Thank you for allowing pharmacy to be a part of this patient's care.  Joe Coleman D 10/28/2023 2:56 AM

## 2023-10-28 NOTE — Assessment & Plan Note (Signed)
 Wound 10/28/23 0024 Pressure Injury Coccyx Mid Stage 1 -  Intact skin with non-blanchable redness of a localized area usually over a bony prominence. (Active)   - Wound care

## 2023-10-28 NOTE — Assessment & Plan Note (Addendum)
 Underlying CKD stage III A Slight worsening of creatinine to 2.65 with baseline around 1.3-1.6.  Mild hyponatremia has been improved. Also found to have elevated CK. -Renal ultrasound ordered -Continue with IV fluid - Monitor renal function -Avoid nephrotoxins

## 2023-10-28 NOTE — Consult Note (Addendum)
 WOC Nurse Consult Note: Reason for Consult: Consult requested to change right leg Vac dressing and assess buttocks. Bilat buttocks/inner gluteal fold/sacrum with red moist macerated skin and patchy areas of partial thickness skin loss.  Appearance is consistent with moisture associated skin damage related to moisture. Affected areas is approx 4X4cm. Topical treatment orders provided for bedside nurses to perform as follows: Foam dressing to bilat buttocks, change Q 3 days or PRN soiling.  Pt had surgery to remove hardware on 9/10 by Dr [name] of the ortho service, according to the EMR.  It is unknown when the Vac was last changed, if ever since surgery. Opsite was in place, instead of Vac drape, so this might be the original post-op dressing.  Skin was moist and macerated with mod amt tan drainage when drape was removed.  Right leg with intact cast/splint applied over Vac dressing.    Pt has home Vac machine but no charger left at the bedside for use after discharge.   Cast/splint left in place below the Vac dressing site.  Cut cotton batting to to access Vac site in 2 locations to knee. There are 2 pieces of green nonadherent dressings in place over 2 post-op wounds to left and right anterior knee; one is approx 4X4 cm and the other is 4X5cm.  I did not remove these, since they might be protecting a possible skin graft or skin substitute site. Sutures to outer leg intact and well approximated.  Applied one piece black sponge over the 2 nonadherent dressings to cont suction. Applied Ace wrap over the top of the splint to hold in place.   WOC will plan to change the Vac dressing again on Fri.        Primary team: Please consult ortho team for information regarding removal of the cast splint and plans for follow-up after discharge.   Thank-you,  Stephane Fought MSN, RN, CWOCN, CWCN-AP, CNS Contact Mon-Fri 0700-1500: [phone]

## 2023-10-28 NOTE — Assessment & Plan Note (Signed)
 Patient was found to have acute left leg distal DVT during most recent hospitalization and was started on Eliquis  -Continue with Eliquis 

## 2023-10-29 ENCOUNTER — Inpatient Hospital Stay (HOSPITAL_COMMUNITY)
Admission: EM | Admit: 2023-10-29 | Discharge: 2023-11-04 | DRG: 682 | Disposition: A | Discharge status: Skilled Nursing Facility | Source: Other Acute Inpatient Hospital | Attending: Family Medicine | Admitting: Family Medicine

## 2023-10-29 ENCOUNTER — Inpatient Hospital Stay

## 2023-10-29 ENCOUNTER — Encounter (HOSPITAL_COMMUNITY): Payer: Self-pay

## 2023-10-29 DIAGNOSIS — R531 Weakness: Secondary | ICD-10-CM | POA: Diagnosis present

## 2023-10-29 DIAGNOSIS — T84629A Infection and inflammatory reaction due to internal fixation device of unspecified bone of leg, initial encounter: Secondary | ICD-10-CM | POA: Diagnosis not present

## 2023-10-29 DIAGNOSIS — Z8249 Family history of ischemic heart disease and other diseases of the circulatory system: Secondary | ICD-10-CM

## 2023-10-29 DIAGNOSIS — G9341 Metabolic encephalopathy: Secondary | ICD-10-CM | POA: Diagnosis present

## 2023-10-29 DIAGNOSIS — T8469XD Infection and inflammatory reaction due to internal fixation device of other site, subsequent encounter: Secondary | ICD-10-CM | POA: Diagnosis not present

## 2023-10-29 DIAGNOSIS — Z7901 Long term (current) use of anticoagulants: Secondary | ICD-10-CM | POA: Diagnosis not present

## 2023-10-29 DIAGNOSIS — M6282 Rhabdomyolysis: Secondary | ICD-10-CM | POA: Diagnosis present

## 2023-10-29 DIAGNOSIS — Z87891 Personal history of nicotine dependence: Secondary | ICD-10-CM

## 2023-10-29 DIAGNOSIS — D509 Iron deficiency anemia, unspecified: Secondary | ICD-10-CM | POA: Diagnosis present

## 2023-10-29 DIAGNOSIS — J449 Chronic obstructive pulmonary disease, unspecified: Secondary | ICD-10-CM | POA: Diagnosis present

## 2023-10-29 DIAGNOSIS — N189 Chronic kidney disease, unspecified: Secondary | ICD-10-CM

## 2023-10-29 DIAGNOSIS — T148XXA Other injury of unspecified body region, initial encounter: Secondary | ICD-10-CM | POA: Diagnosis present

## 2023-10-29 DIAGNOSIS — T847XXA Infection and inflammatory reaction due to other internal orthopedic prosthetic devices, implants and grafts, initial encounter: Secondary | ICD-10-CM

## 2023-10-29 DIAGNOSIS — E1122 Type 2 diabetes mellitus with diabetic chronic kidney disease: Secondary | ICD-10-CM | POA: Diagnosis present

## 2023-10-29 DIAGNOSIS — M79606 Pain in leg, unspecified: Secondary | ICD-10-CM | POA: Diagnosis not present

## 2023-10-29 DIAGNOSIS — Z794 Long term (current) use of insulin: Secondary | ICD-10-CM

## 2023-10-29 DIAGNOSIS — E1165 Type 2 diabetes mellitus with hyperglycemia: Secondary | ICD-10-CM | POA: Insufficient documentation

## 2023-10-29 DIAGNOSIS — D631 Anemia in chronic kidney disease: Secondary | ICD-10-CM | POA: Diagnosis present

## 2023-10-29 DIAGNOSIS — D649 Anemia, unspecified: Secondary | ICD-10-CM | POA: Diagnosis present

## 2023-10-29 DIAGNOSIS — R410 Disorientation, unspecified: Secondary | ICD-10-CM | POA: Diagnosis present

## 2023-10-29 DIAGNOSIS — E1142 Type 2 diabetes mellitus with diabetic polyneuropathy: Secondary | ICD-10-CM | POA: Diagnosis present

## 2023-10-29 DIAGNOSIS — J439 Emphysema, unspecified: Secondary | ICD-10-CM

## 2023-10-29 DIAGNOSIS — N1831 Chronic kidney disease, stage 3a: Secondary | ICD-10-CM | POA: Diagnosis present

## 2023-10-29 DIAGNOSIS — T847XXS Infection and inflammatory reaction due to other internal orthopedic prosthetic devices, implants and grafts, sequela: Secondary | ICD-10-CM

## 2023-10-29 DIAGNOSIS — M868X6 Other osteomyelitis, lower leg: Secondary | ICD-10-CM | POA: Diagnosis not present

## 2023-10-29 DIAGNOSIS — E538 Deficiency of other specified B group vitamins: Secondary | ICD-10-CM

## 2023-10-29 DIAGNOSIS — T819XXA Unspecified complication of procedure, initial encounter: Secondary | ICD-10-CM | POA: Diagnosis present

## 2023-10-29 DIAGNOSIS — Z79899 Other long term (current) drug therapy: Secondary | ICD-10-CM | POA: Diagnosis not present

## 2023-10-29 DIAGNOSIS — R4182 Altered mental status, unspecified: Secondary | ICD-10-CM | POA: Diagnosis not present

## 2023-10-29 DIAGNOSIS — N179 Acute kidney failure, unspecified: Principal | ICD-10-CM | POA: Diagnosis present

## 2023-10-29 DIAGNOSIS — I129 Hypertensive chronic kidney disease with stage 1 through stage 4 chronic kidney disease, or unspecified chronic kidney disease: Secondary | ICD-10-CM | POA: Diagnosis present

## 2023-10-29 DIAGNOSIS — A498 Other bacterial infections of unspecified site: Secondary | ICD-10-CM | POA: Diagnosis not present

## 2023-10-29 DIAGNOSIS — Z86718 Personal history of other venous thrombosis and embolism: Secondary | ICD-10-CM | POA: Diagnosis not present

## 2023-10-29 DIAGNOSIS — L089 Local infection of the skin and subcutaneous tissue, unspecified: Secondary | ICD-10-CM | POA: Diagnosis present

## 2023-10-29 DIAGNOSIS — I82402 Acute embolism and thrombosis of unspecified deep veins of left lower extremity: Secondary | ICD-10-CM | POA: Diagnosis present

## 2023-10-29 DIAGNOSIS — Z7401 Bed confinement status: Secondary | ICD-10-CM | POA: Diagnosis not present

## 2023-10-29 LAB — CBC
HCT: 26.4 % — ABNORMAL LOW (ref 39.0–52.0)
Hemoglobin: 8.3 g/dL — ABNORMAL LOW (ref 13.0–17.0)
MCH: 27.9 pg (ref 26.0–34.0)
MCHC: 31.4 g/dL (ref 30.0–36.0)
MCV: 88.9 fL (ref 80.0–100.0)
Platelets: 262 K/uL (ref 150–400)
RBC: 2.97 MIL/uL — ABNORMAL LOW (ref 4.22–5.81)
RDW: 16 % — ABNORMAL HIGH (ref 11.5–15.5)
WBC: 10.2 K/uL (ref 4.0–10.5)
nRBC: 0 % (ref 0.0–0.2)

## 2023-10-29 LAB — RENAL FUNCTION PANEL
Albumin: 2.2 g/dL — ABNORMAL LOW (ref 3.5–5.0)
Anion gap: 9 (ref 5–15)
BUN: 50 mg/dL — ABNORMAL HIGH (ref 8–23)
CO2: 18 mmol/L — ABNORMAL LOW (ref 22–32)
Calcium: 8.7 mg/dL — ABNORMAL LOW (ref 8.9–10.3)
Chloride: 111 mmol/L (ref 98–111)
Creatinine, Ser: 2.38 mg/dL — ABNORMAL HIGH (ref 0.61–1.24)
GFR, Estimated: 27 mL/min — ABNORMAL LOW (ref >60)
Glucose, Bld: 96 mg/dL (ref 70–99)
Phosphorus: 3 mg/dL (ref 2.5–4.6)
Potassium: 4.4 mmol/L (ref 3.5–5.1)
Sodium: 138 mmol/L (ref 135–145)

## 2023-10-29 LAB — URINALYSIS, W/ REFLEX TO CULTURE (INFECTION SUSPECTED)
Bilirubin Urine: NEGATIVE
Glucose, UA: NEGATIVE mg/dL
Ketones, ur: NEGATIVE mg/dL
Leukocytes,Ua: NEGATIVE
Nitrite: NEGATIVE
Protein, ur: 30 mg/dL — AB
Specific Gravity, Urine: 1.008 (ref 1.005–1.030)
pH: 5 (ref 5.0–8.0)

## 2023-10-29 LAB — GLUCOSE, CAPILLARY
Glucose-Capillary: 100 mg/dL — ABNORMAL HIGH (ref 70–99)
Glucose-Capillary: 267 mg/dL — ABNORMAL HIGH (ref 70–99)
Glucose-Capillary: 70 mg/dL (ref 70–99)
Glucose-Capillary: 212 mg/dL — ABNORMAL HIGH (ref 70–99)

## 2023-10-29 LAB — CK: Total CK: 2788 U/L — ABNORMAL HIGH (ref 49–397)

## 2023-10-29 MED ORDER — SENNOSIDES-DOCUSATE SODIUM 8.6-50 MG PO TABS
1.0000 | ORAL_TABLET | Freq: Every evening | ORAL | Status: DC | PRN
  Administered 2023-11-02: 1 via ORAL
  Filled 2023-10-29: qty 1

## 2023-10-29 MED ORDER — SODIUM CHLORIDE 0.9 % IV SOLN
500.0000 mg | INTRAVENOUS | Status: DC
  Filled 2023-10-29: qty 500

## 2023-10-29 MED ORDER — APIXABAN 5 MG PO TABS: ORAL_TABLET | ORAL | Status: DC

## 2023-10-29 MED ORDER — GABAPENTIN 100 MG PO CAPS: 100.0000 mg | ORAL_CAPSULE | Freq: Every day | ORAL | Status: DC

## 2023-10-29 MED ORDER — GABAPENTIN 100 MG PO CAPS
100.0000 mg | ORAL_CAPSULE | Freq: Every day | ORAL | Status: DC
  Administered 2023-10-29 – 2023-11-03 (×6): 100 mg via ORAL
  Filled 2023-10-29 (×6): qty 1

## 2023-10-29 MED ORDER — SODIUM CHLORIDE 0.9 % IV SOLN: 500.0000 mg | INTRAVENOUS | Status: DC

## 2023-10-29 MED ORDER — BUDESON-GLYCOPYRROL-FORMOTEROL 160-9-4.8 MCG/ACT IN AERO
2.0000 | INHALATION_SPRAY | Freq: Two times a day (BID) | RESPIRATORY_TRACT | Status: DC
  Administered 2023-10-30 – 2023-11-04 (×11): 2 via RESPIRATORY_TRACT
  Filled 2023-10-29: qty 5.9

## 2023-10-29 MED ORDER — ACETAMINOPHEN 325 MG PO TABS
650.0000 mg | ORAL_TABLET | Freq: Four times a day (QID) | ORAL | Status: DC | PRN
  Administered 2023-10-30 – 2023-10-31 (×2): 650 mg via ORAL
  Filled 2023-10-29 (×4): qty 2

## 2023-10-29 MED ORDER — ONDANSETRON HCL 4 MG/2ML IJ SOLN: 4.0000 mg | Freq: Four times a day (QID) | INTRAMUSCULAR | Status: DC | PRN

## 2023-10-29 MED ORDER — METHOCARBAMOL 500 MG PO TABS
500.0000 mg | ORAL_TABLET | Freq: Four times a day (QID) | ORAL | Status: DC | PRN
  Administered 2023-11-01: 500 mg via ORAL
  Filled 2023-10-29: qty 1

## 2023-10-29 MED ORDER — INSULIN ASPART 100 UNIT/ML IJ SOLN
0.0000 [IU] | Freq: Three times a day (TID) | INTRAMUSCULAR | Status: DC
  Administered 2023-10-30: 2 [IU] via SUBCUTANEOUS
  Administered 2023-10-31: 1 [IU] via SUBCUTANEOUS
  Administered 2023-11-01: 2 [IU] via SUBCUTANEOUS
  Administered 2023-11-01 – 2023-11-02 (×2): 1 [IU] via SUBCUTANEOUS
  Administered 2023-11-02: 2 [IU] via SUBCUTANEOUS
  Administered 2023-11-03: 1 [IU] via SUBCUTANEOUS

## 2023-10-29 MED ORDER — SODIUM CHLORIDE 0.9 % IV SOLN
500.0000 mg | INTRAVENOUS | Status: DC
  Administered 2023-10-29 – 2023-11-03 (×6): 500 mg via INTRAVENOUS
  Filled 2023-10-29 (×9): qty 500

## 2023-10-29 MED ORDER — SODIUM CHLORIDE 0.9 % IV SOLN: INTRAVENOUS | Status: DC

## 2023-10-29 MED ORDER — POLYETHYLENE GLYCOL 3350 17 G PO PACK
17.0000 g | PACK | Freq: Every day | ORAL | Status: DC
  Administered 2023-10-30 – 2023-11-02 (×4): 17 g via ORAL
  Filled 2023-10-29 (×7): qty 1

## 2023-10-29 MED ORDER — SODIUM CHLORIDE 0.9 % IV SOLN: 75.0000 mL | INTRAVENOUS | Status: DC

## 2023-10-29 MED ORDER — VITAMIN B-12 1000 MCG PO TABS
1000.0000 ug | ORAL_TABLET | Freq: Every day | ORAL | Status: DC
  Administered 2023-10-30 – 2023-11-04 (×6): 1000 ug via ORAL
  Filled 2023-10-29 (×6): qty 1

## 2023-10-29 MED ORDER — BISACODYL 5 MG PO TBEC: 5.0000 mg | DELAYED_RELEASE_TABLET | Freq: Every day | ORAL | Status: DC | PRN

## 2023-10-29 MED ORDER — ALBUTEROL SULFATE (2.5 MG/3ML) 0.083% IN NEBU: 2.5000 mg | INHALATION_SOLUTION | Freq: Four times a day (QID) | RESPIRATORY_TRACT | Status: DC | PRN

## 2023-10-29 MED ORDER — ACETAMINOPHEN 325 MG PO TABS: 650.0000 mg | ORAL_TABLET | Freq: Four times a day (QID) | ORAL | Status: AC | PRN

## 2023-10-29 MED ORDER — SODIUM CHLORIDE 0.9% FLUSH: 10.0000 mL | Freq: Two times a day (BID) | INTRAVENOUS | Status: DC

## 2023-10-29 MED ORDER — APIXABAN 5 MG PO TABS: 5.0000 mg | ORAL_TABLET | Freq: Two times a day (BID) | ORAL | Status: DC

## 2023-10-29 MED ORDER — CYANOCOBALAMIN 1000 MCG/ML IJ SOLN
1000.0000 ug | Freq: Once | INTRAMUSCULAR | Status: DC
  Filled 2023-10-29: qty 1

## 2023-10-29 MED ORDER — ACETAMINOPHEN 650 MG RE SUPP: 650.0000 mg | Freq: Four times a day (QID) | RECTAL | Status: DC | PRN

## 2023-10-29 MED ORDER — FERROUS SULFATE 325 (65 FE) MG PO TABS
325.0000 mg | ORAL_TABLET | Freq: Every day | ORAL | Status: DC
  Administered 2023-10-30 – 2023-11-04 (×6): 325 mg via ORAL
  Filled 2023-10-29 (×6): qty 1

## 2023-10-29 MED ORDER — ONDANSETRON HCL 4 MG PO TABS: 4.0000 mg | ORAL_TABLET | Freq: Four times a day (QID) | ORAL | Status: AC | PRN

## 2023-10-29 MED ORDER — INSULIN ASPART 100 UNIT/ML IJ SOLN
0.0000 [IU] | Freq: Every day | INTRAMUSCULAR | Status: DC
  Administered 2023-10-29: 2 [IU] via SUBCUTANEOUS

## 2023-10-29 MED ORDER — CHLORHEXIDINE GLUCONATE CLOTH 2 % EX PADS: 6.0000 | MEDICATED_PAD | Freq: Every day | CUTANEOUS | Status: DC

## 2023-10-29 MED ORDER — APIXABAN 5 MG PO TABS
5.0000 mg | ORAL_TABLET | Freq: Two times a day (BID) | ORAL | Status: DC
Start: 2023-10-29 — End: 2023-11-04
  Administered 2023-10-29 – 2023-11-04 (×12): 5 mg via ORAL
  Filled 2023-10-29 (×12): qty 1

## 2023-10-29 MED ORDER — INSULIN ASPART 100 UNIT/ML IJ SOLN: 0.0000 [IU] | Freq: Every day | INTRAMUSCULAR | Status: DC

## 2023-10-29 MED ORDER — HYDROCODONE-ACETAMINOPHEN 5-325 MG PO TABS
1.0000 | ORAL_TABLET | ORAL | Status: DC | PRN
  Administered 2023-10-30 – 2023-11-04 (×13): 2 via ORAL
  Filled 2023-10-29 (×13): qty 2

## 2023-10-29 MED ORDER — APIXABAN 5 MG PO TABS: 10.0000 mg | ORAL_TABLET | Freq: Two times a day (BID) | ORAL | Status: DC

## 2023-10-29 MED ORDER — INSULIN ASPART 100 UNIT/ML IJ SOLN: 0.0000 [IU] | Freq: Three times a day (TID) | INTRAMUSCULAR | Status: DC

## 2023-10-29 MED ORDER — ONDANSETRON HCL 4 MG PO TABS: 4.0000 mg | ORAL_TABLET | Freq: Four times a day (QID) | ORAL | Status: DC | PRN

## 2023-10-29 MED ORDER — CEFTRIAXONE SODIUM 2 G IJ SOLR: 2.0000 g | INTRAMUSCULAR | Status: DC

## 2023-10-29 MED ORDER — SODIUM CHLORIDE 0.9 % IV SOLN: INTRAVENOUS | Status: AC

## 2023-10-29 MED ORDER — GLUCERNA SHAKE PO LIQD
237.0000 mL | Freq: Three times a day (TID) | ORAL | Status: DC
  Administered 2023-10-29 – 2023-11-02 (×5): 237 mL via ORAL

## 2023-10-29 NOTE — Discharge Summary (Addendum)
 Physician Discharge Summary   Patient: Joe Coleman MRN: [id number] DOB: [birth year]  Admit date:     10/27/2023  Discharge date: 10/29/23  Discharge Physician: Charlie Patterson   PCP: [facility]   Recommendations at discharge:    Transfer to [facility] when bed available  Discharge Diagnoses: Principal Problem:   Acute metabolic encephalopathy Active Problems:   Acute kidney injury superimposed on chronic kidney disease   Cellulitis of right leg   Rhabdomyolysis   Hardware complicating wound infection   Type 2 diabetes mellitus with peripheral neuropathy (HCC)   DVT (deep venous thrombosis) (HCC)   Chronic obstructive pulmonary disease (COPD) (HCC)   GERD without esophagitis   Vitamin B12 deficiency   Pressure injury of skin   Normocytic anemia   Uncontrolled type 2 diabetes mellitus with hyperglycemia, without long-term current use of insulin  [facility])   Hospital Course: Partly taken from H&P.  Joe Coleman is a 78 y.o. 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   On presentation stable vitals, labs with mild hyponatremia at 133, CO2 of 19, BUN 48, creatinine 2.41, leukocytosis at 13.6, hemoglobin 8.6.  9/23: Blood pressure mildly elevated, slight worsening of creatinine to 2.65, hyponatremia resolved and some improvement of CO2 to 21.  CK at 1699.  Mentation improving and at baseline Renal ultrasound ordered. Bladder scan with 200 cc Daptomycin  was discontinued and cefepime  dose was adjusted according to renal function. PT and OT  evaluation ordered  Recent hospitalization with recommendation to go to SNF but patient decided to go home, a 80 year old sister is caregiver.  9/24.  Case discussed with infectious disease specialist and changed antibiotics over to IV Invanz .  Stopped Maxipime  secondary to altered mental status.  CT head ordered.  Patient with rhabdomyolysis continue IV fluids.  Hold daptomycin  secondary to rhabdomyolysis.  Case discussed with [name] orthopedic surgery at [facility] and wanted to evaluate him at [facility] so we can look at his wounds and cast.  Assessment and Plan: * Acute metabolic encephalopathy Will get a CT scan of the head.  Could be secondary to Maxipime .  Could be secondary to infection.  So far repeat blood cultures are negative.  Continue to monitor.  Decrease dose of gabapentin .  Replace vitamin B12.  Acute kidney injury superimposed on chronic kidney disease Underlying CKD stage III A Creatinine peak of 2.65 and down to  2.38.  Baseline creatinine around 1.66.  IV fluid hydration  Hardware complicating wound infection Case discussed with [name] orthopedic surgery at [facility] who did the procedure and okay with changing dressings daily with 4 x 4 dressing.  He was okay with the way the cast looked.  Will need follow-up as outpatient.  Will have ID adjust antibiotics since daptomycin  was held and Maxipime  could be causing altered mental status.  IV invanz  ordered  Rhabdomyolysis CK up to 2788.  Could be elevated secondary to daptomycin .  Continue IV fluids.  DVT (deep venous thrombosis) (HCC) Patient was found to have acute left leg distal DVT during most recent hospitalization and was  started on Eliquis   Type 2 diabetes mellitus with peripheral neuropathy (HCC) On sliding scale insulin .  Decrease dose of gabapentin   Chronic obstructive pulmonary disease (COPD) (HCC) No concern of exacerbation - Will continue DuoNebs and Trelegy.  GERD without esophagitis - Will  continue PPI therapy.  Uncontrolled type 2 diabetes mellitus with hyperglycemia, without long-term current use of insulin  (HCC) Last hemoglobin A1c 7.6.  Patient on sliding scale insulin .  Normocytic anemia Hemoglobin 8.3.  Continue to monitor.  Likely secondary to B12 deficiency and secondary to IV fluid hydration.  Ferritin elevated which could go along with anemia of chronic disease.  Pressure injury of skin Wound 10/28/23 0024 Pressure Injury Coccyx Mid Stage 1 -  Intact skin with non-blanchable redness of a localized area usually over a bony prominence. (Active)   - Wound care  Vitamin B12 deficiency Replace IM B12 and continue oral B12.        Consultants: Spoke with ID and orthopedics Procedures performed: None Disposition: Transfer to [facility] when bed available Diet recommendation:  Heart healthy DISCHARGE MEDICATION: Allergies as of 10/29/2023       Reactions   Losartan Other (See Comments)   Hyperkalemia (>5.5 multiple times on losartan 25mg )   Chicken Allergy Nausea And Vomiting      Poultry Meal Nausea And Vomiting        Medication List     STOP taking these medications    ceFEPime  IVPB Commonly known as: MAXIPIME    daptomycin  IVPB Commonly known as: CUBICIN    Eliquis  DVT/PE Starter Pack Generic drug: Apixaban  Starter Pack (10mg  and 5mg ) Replaced by: apixaban  5 MG Tabs tablet   ipratropium-albuterol  0.5-2.5 (3) MG/3ML Soln Commonly known as: DUONEB   metFORMIN  500 MG tablet Commonly known as: GLUCOPHAGE    nicotine  14 mg/24hr patch Commonly known as: NICODERM CQ  - dosed in mg/24 hours   pantoprazole  40 MG tablet Commonly known as: PROTONIX    senna-docusate 8.6-50 MG tablet Commonly known as: Senokot-S       TAKE these medications    acetaminophen  325 MG tablet Commonly known as: TYLENOL  Take 2 tablets (650 mg total) by mouth every 6 (six) hours as needed for mild pain (pain score 1-3) or fever (or Fever >/= 101).   albuterol   108 (90 Base) MCG/ACT inhaler Commonly known as: VENTOLIN  HFA Inhale 2 puffs into the lungs every 6 (six) hours as needed for wheezing or shortness of breath.   apixaban  5 MG Tabs tablet Commonly known as: ELIQUIS  Take 2 tablets (10 mg total) by mouth 2 (two) times daily for 1 day, THEN 1 tablet (5 mg total) 2 (two) times daily. Start taking on: October 29, 2023 Replaces: Eliquis  DVT/PE Starter Pack   bisacodyl  5 MG EC tablet Commonly known as: DULCOLAX Take 1 tablet (5 mg total) by mouth daily as needed for moderate constipation.   Chlorhexidine  Gluconate Cloth 2 % Pads Apply 6 each topically daily. Start taking on: October 30, 2023   cyanocobalamin  1000 MCG tablet Commonly known as: VITAMIN B12 Take 1 tablet (1,000 mcg total) by mouth daily.   ertapenem  500 mg in sodium chloride  0.9 % 50 mL Inject 500 mg into the vein daily.   feeding supplement (GLUCERNA SHAKE) Liqd Take 237 mLs by mouth 3 (three) times daily between meals.   ferrous sulfate  325 (65 FE) MG tablet Take 1 tablet (325 mg total) by mouth daily with breakfast.   gabapentin  100 MG capsule Commonly known as: NEURONTIN  Take 1 capsule (100 mg  total) by mouth at bedtime. What changed:  medication strength how much to take   guaiFENesin  600 MG 12 hr tablet Commonly known as: Mucinex  Take 1 tablet (600 mg total) by mouth 2 (two) times daily.   insulin  aspart 100 UNIT/ML injection Commonly known as: novoLOG  Inject 0-5 Units into the skin at bedtime.   insulin  aspart 100 UNIT/ML injection Commonly known as: novoLOG  Inject 0-9 Units into the skin 3 (three) times daily with meals.   methocarbamol  500 MG tablet Commonly known as: ROBAXIN  Take 1 tablet (500 mg total) by mouth every 6 (six) hours as needed for muscle spasms.   ondansetron  4 MG tablet Commonly known as: ZOFRAN  Take 1 tablet (4 mg total) by mouth every 6 (six) hours as needed for nausea.   oxyCODONE -acetaminophen  5-325 MG tablet Commonly  known as: Percocet Take 1 tablet by mouth every 6 (six) hours as needed for severe pain (pain score 7-10) or moderate pain (pain score 4-6).   polyethylene glycol 17 g packet Commonly known as: MIRALAX  / GLYCOLAX  Take 17 g by mouth daily.   sodium chloride  0.9 % infusion Inject 75 mLs into the vein continuous.   sodium chloride  flush 0.9 % Soln Commonly known as: NS 10-40 mLs by Intracatheter route every 12 (twelve) hours.   Trelegy Ellipta 200-62.5-25 MCG/ACT Aepb Generic drug: Fluticasone-Umeclidin-Vilant Inhale 1 puff into the lungs daily.   vitamin D3 25 MCG tablet Commonly known as: CHOLECALCIFEROL  Take 1 tablet (1,000 Units total) by mouth daily.        Follow-up Information     Haddix, Franky SQUIBB, MD. Schedule an appointment as soon as possible for a visit in 1 week(s).   Specialty: Orthopedic Surgery Why: for wound evaluation Contact information: [address] [place] [address] [phone]                Discharge Exam: Fredricka Weights   10/29/23 0941  Weight: 48.9 kg   Physical Exam HENT:     Head: Normocephalic.     Mouth/Throat:     Pharynx: No oropharyngeal exudate.  Eyes:     General: Lids are normal.     Conjunctiva/sclera: Conjunctivae normal.  Cardiovascular:     Rate and Rhythm: Normal rate and regular rhythm.     Heart sounds: Normal heart sounds, S1 normal and S2 normal.  Pulmonary:     Breath sounds: No decreased breath sounds, wheezing, rhonchi or rales.  Abdominal:     Palpations: Abdomen is soft.     Tenderness: There is no abdominal tenderness.  Musculoskeletal:     Right lower leg: No swelling.     Left lower leg: No swelling.  Skin:    General: Skin is warm.     Findings: No rash.  Neurological:     Mental Status: He is confused.      Condition at discharge: Fair  The results of significant diagnostics from this hospitalization (including imaging, microbiology, ancillary and laboratory) are listed below for  reference.   Imaging Studies: US  RENAL Result Date: 10/28/2023 CLINICAL DATA:  Acute kidney injury EXAM: RENAL / URINARY TRACT ULTRASOUND COMPLETE COMPARISON:  Renal ultrasound examination dated 07/29/2023 FINDINGS: Right Kidney: Length = 10.4 cm Diffusely increased cortical echogenicity with preserved corticomedullary differentiation. Again seen is renal cortical thinning. No urinary tract dilation or shadowing calculi. The ureter is not seen. Left Kidney: Length = 10.4 cm Diffusely increased cortical echogenicity with preserved corticomedullary differentiation. Again seen is renal cortical thinning. Multiple cortical  cysts measuring up to 1.0 cm in the interpolar kidney. No urinary tract dilation or shadowing calculi. The ureter is not seen. Bladder: Appears normal for degree of bladder distention. Other: None. IMPRESSION: 1. No urinary tract dilation or shadowing calculi. 2. Diffusely increased cortical echogenicity with preserved corticomedullary differentiation, consistent with medical renal disease. 3. Left renal cysts measuring up to 1.0 cm. Electronically Signed   By: Limin  Xu M.D.   On: 10/28/2023 18:52   VAS US  LOWER EXTREMITY VENOUS (DVT) Result Date: 10/23/2023  Lower Venous DVT Study Patient Name:  JENS SIEMS  Date of Exam:   10/23/2023 Medical Rec #: [id number]        Accession #:    [id number] Date of Birth: 25-Jan-1946        Patient Gender: M Patient Age:   29 years Exam Location:  [facility] Procedure:      VAS US  LOWER EXTREMITY VENOUS (DVT) Referring Phys: PRANAV PATEL --------------------------------------------------------------------------------  Indications: Left calf pain. Status post right tibial fracture 08/20/23, with ORIF 08/25/23. Readmitted for hardware removal secondary to infection, 10/15/23.  Limitations: Bandages and Wound vac on right leg from distal thigh over foot. Comparison Study: No prior study on file Performing Technologist: Alberta Lis RVS  Examination  Guidelines: A complete evaluation includes B-mode imaging, spectral Doppler, color Doppler, and power Doppler as needed of all accessible portions of each vessel. Bilateral testing is considered an integral part of a complete examination. Limited examinations for reoccurring indications may be performed as noted. The reflux portion of the exam is performed with the patient in reverse Trendelenburg.  +---------+---------------+---------+-----------+----------+--------------+ RIGHT    CompressibilityPhasicitySpontaneityPropertiesThrombus Aging +---------+---------------+---------+-----------+----------+--------------+ CFV      Full           Yes      Yes                                 +---------+---------------+---------+-----------+----------+--------------+ SFJ      Full                                                        +---------+---------------+---------+-----------+----------+--------------+ FV Prox  Full                                                        +---------+---------------+---------+-----------+----------+--------------+ FV Mid   Full                                                        +---------+---------------+---------+-----------+----------+--------------+ FV DistalFull                                                        +---------+---------------+---------+-----------+----------+--------------+ PFV      Full                                                        +---------+---------------+---------+-----------+----------+--------------+   +---------+---------------+---------+-----------+----------+--------------+  LEFT     CompressibilityPhasicitySpontaneityPropertiesThrombus Aging +---------+---------------+---------+-----------+----------+--------------+ CFV      Full           Yes      Yes                                 +---------+---------------+---------+-----------+----------+--------------+ SFJ      Full                                                         +---------+---------------+---------+-----------+----------+--------------+ FV Prox  Full                                                        +---------+---------------+---------+-----------+----------+--------------+ FV Mid   Full                                                        +---------+---------------+---------+-----------+----------+--------------+ FV DistalFull           Yes      Yes                                 +---------+---------------+---------+-----------+----------+--------------+ PFV      Full                                                        +---------+---------------+---------+-----------+----------+--------------+ POP      Full           Yes      Yes                                 +---------+---------------+---------+-----------+----------+--------------+ PTV      Partial                                      Acute          +---------+---------------+---------+-----------+----------+--------------+ PERO     None                                         Acute          +---------+---------------+---------+-----------+----------+--------------+     Summary: RIGHT: - There is no evidence of deep vein thrombosis in the lower extremity. However, portions of this examination were limited- see technologist comments above.  LEFT: - Findings consistent with acute deep vein thrombosis involving one of the paired left posterior tibial veins, and both of the left peroneal veins.  - No cystic structure found in the popliteal fossa.  *  See table(s) above for measurements and observations. Electronically signed by Debby Robertson on 10/23/2023 at 4:06:19 PM.    Final    DG CHEST PORT 1 VIEW Result Date: 10/22/2023 EXAM: 1 VIEW XRAY OF THE CHEST 10/22/2023 11:35:00 AM COMPARISON: 10/17/2023 CLINICAL HISTORY: SOB (shortness of breath) FINDINGS: LINES, TUBES AND DEVICES: Left PICC with tip in distal SVC.  Surgical clips at GE junction. LUNGS AND PLEURA: Blunting of left costophrenic angle without definite pleural effusion. Stable left basilar opacities. HEART AND MEDIASTINUM: No acute abnormality of the cardiac and mediastinal silhouettes. BONES AND SOFT TISSUES: Thoracic spondylosis. IMPRESSION: 1. Interval placement of left PICC with tip over the distal SVC. 2. Stable left basilar opacities, favor atelectasis versus scarring. Electronically signed by: Donnice Mania MD 10/22/2023 12:50 PM EDT RP Workstation: [id number]   DG CHEST PORT 1 VIEW Result Date: 10/17/2023 CLINICAL DATA:  Cough EXAM: PORTABLE CHEST 1 VIEW COMPARISON:  August 31, 2023 chest radiograph and prior studies FINDINGS: Similar appearance of streaky bibasilar opacities, likely representing atelectasis and or scarring. No new consolidation. No pleural effusions. No pneumothorax. Unchanged cardiomediastinal silhouette. IMPRESSION: Likely bibasilar scarring/atelectasis. Electronically Signed   By: Michaeline Blanch M.D.   On: 10/17/2023 11:44   VAS US  ABI WITH/WO TBI Result Date: 10/17/2023  LOWER EXTREMITY DOPPLER STUDY Patient Name:  AREND BAHL  Date of Exam:   10/16/2023 Medical Rec #: [id number]        Accession #:    [id number] Date of Birth: [birth year]        Patient Gender: M Patient Age:   23 years Exam Location:  [facility] Procedure:      VAS US  ABI WITH/WO TBI Referring Phys: EKTA PATEL --------------------------------------------------------------------------------  Indications: Decreased pedal pulses. High Risk Factors: Hypertension, Diabetes, past history of smoking.  Limitations: Today's exam was limited due to an open wound, bandages and post-op              cast, unable to compress. Comparison Study: No prior exam. Performing Technologist: Edilia Elden Appl  Examination Guidelines: A complete evaluation includes at minimum, Doppler waveform signals and systolic blood pressure reading at the level of bilateral brachial,  anterior tibial, and posterior tibial arteries, when vessel segments are accessible. Bilateral testing is considered an integral part of a complete examination. Photoelectric Plethysmograph (PPG) waveforms and toe systolic pressure readings are included as required and additional duplex testing as needed. Limited examinations for reoccurring indications may be performed as noted.  ABI Findings: +---------+------------------+-----+---------+-------------+ Right    Rt Pressure (mmHg)IndexWaveform Comment       +---------+------------------+-----+---------+-------------+ Brachial 129                    triphasic              +---------+------------------+-----+---------+-------------+ PTA                                      Not obtained. +---------+------------------+-----+---------+-------------+ DP                              triphasic              +---------+------------------+-----+---------+-------------+ Great Toe70                0.54 Normal                 +---------+------------------+-----+---------+-------------+ +---------+------------------+-----+---------+-------+  Left     Lt Pressure (mmHg)IndexWaveform Comment +---------+------------------+-----+---------+-------+ Brachial 127                    triphasic        +---------+------------------+-----+---------+-------+ PTA      144               1.12 triphasic        +---------+------------------+-----+---------+-------+ DP       133               1.03 triphasic        +---------+------------------+-----+---------+-------+ Great Toe101               0.78 Normal           +---------+------------------+-----+---------+-------+ Unable to obtain right lower extremity pressures due to patient's post-op cast. Waveform dopplers of the dorsalis pedis visualized.  Summary: Right: The right toe-brachial index is abnormal.  Unable to obtain pressures. Left: Resting left ankle-brachial index is within  normal range. The left toe-brachial index is normal.  *See table(s) above for measurements and observations.  Electronically signed by Debby Robertson on 10/17/2023 at 7:17:31 AM.    Final    US  EKG SITE RITE Result Date: 10/17/2023 If Site Rite image not attached, placement could not be confirmed due to current cardiac rhythm.  DG Tibia/Fibula Right Port Result Date: 10/15/2023 CLINICAL DATA:  Left tibial and fibular fractures. EXAM: PORTABLE RIGHT TIBIA AND FIBULA - 2 VIEW COMPARISON:  October 14, 2023.  August 25, 2023. FINDINGS: The right lower leg has been casted and immobilized status post surgical removal of fixation hardware. Mildly to moderately displaced and comminuted fracture is seen involving the proximal right tibia, with mildly displaced proximal right fibula fracture. IMPRESSION: Casted and immobilized right lower leg status post surgical removal of fixation hardware. Electronically Signed   By: Lynwood Landy Raddle M.D.   On: 10/15/2023 13:37   DG Tibia/Fibula Right Result Date: 10/15/2023 CLINICAL DATA:  Hardware removal. EXAM: DG TIBIA/FIBULA 2V*R*; DG C-ARM 1-60 MIN-NO REPORT Radiation exposure index: 1.02 mGy. COMPARISON:  October 14, 2023. FINDINGS: Five intraoperative fluoroscopic images were obtained of the right lower leg. These images demonstrate interval removal of surgical internal fixation hardware involving proximal tibia. Mildly displaced oblique fracture of proximal tibia is again noted. Status post right total knee arthroplasty. IMPRESSION: Fluoroscopic guidance provided during surgical hardware removal. Electronically Signed   By: Lynwood Landy Raddle M.D.   On: 10/15/2023 13:35   DG C-Arm 1-60 Min-No Report Result Date: 10/15/2023 CLINICAL DATA:  Hardware removal. EXAM: DG TIBIA/FIBULA 2V*R*; DG C-ARM 1-60 MIN-NO REPORT Radiation exposure index: 1.02 mGy. COMPARISON:  October 14, 2023. FINDINGS: Five intraoperative fluoroscopic images were obtained of the right lower leg. These  images demonstrate interval removal of surgical internal fixation hardware involving proximal tibia. Mildly displaced oblique fracture of proximal tibia is again noted. Status post right total knee arthroplasty. IMPRESSION: Fluoroscopic guidance provided during surgical hardware removal. Electronically Signed   By: Lynwood Landy Raddle M.D.   On: 10/15/2023 13:35   DG C-Arm 1-60 Min-No Report Result Date: 10/15/2023 CLINICAL DATA:  Hardware removal. EXAM: DG TIBIA/FIBULA 2V*R*; DG C-ARM 1-60 MIN-NO REPORT Radiation exposure index: 1.02 mGy. COMPARISON:  October 14, 2023. FINDINGS: Five intraoperative fluoroscopic images were obtained of the right lower leg. These images demonstrate interval removal of surgical internal fixation hardware involving proximal tibia. Mildly displaced oblique fracture of proximal tibia is again noted. Status post right total knee  arthroplasty. IMPRESSION: Fluoroscopic guidance provided during surgical hardware removal. Electronically Signed   By: Lynwood Landy Raddle M.D.   On: 10/15/2023 13:35   DG Knee 2 Views Right Result Date: 10/14/2023 CLINICAL DATA:  wound, visible surg hardware EXAM: RIGHT KNEE - 1-2 VIEW COMPARISON:  08/25/2023. FINDINGS: Previously seen comminuted tibial fractures and proximal fibular fractures are again seen. No acute fracture or dislocation. No aggressive osseous lesion. 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 The hardware is intact. No periprosthetic fracture or lucency. There is near anatomic alignment. No knee effusion or focal soft tissue swelling. No radiopaque foreign bodies. IMPRESSION: No acute osseous abnormality of the right knee. Electronically Signed   By: Ree Molt M.D.   On: 10/14/2023 15:23    Microbiology: Results for orders placed or performed during the hospital encounter of 10/27/23  Culture, blood (routine x 2)     Status: None (Preliminary result)   Collection Time: 10/27/23 10:19 PM   Specimen:  BLOOD  Result Value Ref Range Status   Specimen Description BLOOD LEFT ARM  Final   Special Requests   Final    BOTTLES DRAWN AEROBIC AND ANAEROBIC Blood Culture adequate volume   Culture   Final    NO GROWTH 2 DAYS Performed at [facility], [address] [place], [place] [address]    Report Status PENDING  Incomplete  Culture, blood (routine x 2)     Status: None (Preliminary result)   Collection Time: 10/27/23 10:20 PM   Specimen: BLOOD RIGHT ARM  Result Value Ref Range Status   Specimen Description BLOOD RIGHT ARM  Final   Special Requests   Final    BOTTLES DRAWN AEROBIC AND ANAEROBIC Blood Culture adequate volume   Culture   Final    NO GROWTH 2 DAYS Performed at [facility], [address] [place], [place] [address]    Report Status PENDING  Incomplete    Labs: CBC: Recent Labs  Lab 10/23/23 0311 10/24/23 0229 10/27/23 1603 10/28/23 0246 10/29/23 0535  WBC 8.6 9.3 13.6* 12.2* 10.2  HGB 8.3* 8.7* 8.6* 8.6* 8.3*  HCT 27.8* 28.7* 28.4* 28.9* 26.4*  MCV 91.4 90.5 90.4 91.7 88.9  PLT 225 246 288 249 262   Basic Metabolic Panel: Recent Labs  Lab 10/23/23 0311 10/24/23 0229 10/27/23 1603 10/28/23 0246 10/29/23 0535  NA 137 135 133* 137 138  K 4.7 4.8 4.6 4.4 4.4  CL 101 104 104 109 111  CO2 25 25 19* 21* 18*  GLUCOSE 117* 131* 148* 104* 96  BUN 30* 31* 48* 52* 50*  CREATININE 1.42* 1.66* 2.41* 2.65* 2.38*  CALCIUM 9.0 9.0 8.8* 8.5* 8.7*  PHOS  --   --   --   --  3.0   Liver Function Tests: Recent Labs  Lab 10/29/23 0535  ALBUMIN  2.2*   CBG: Recent Labs  Lab 10/24/23 1151 10/28/23 1730 10/28/23 2257 10/29/23 0834 10/29/23 1145  GLUCAP 152* 135* 129* 100* 267*    Discharge time spent: greater than 30 minutes.  Signed: Charlie Patterson, MD [facility] 10/29/2023

## 2023-10-29 NOTE — Progress Notes (Signed)
CT Head negative.

## 2023-10-29 NOTE — Assessment & Plan Note (Signed)
 Patient was found to have acute left leg distal DVT during most recent hospitalization and was started on Eliquis 

## 2023-10-29 NOTE — Assessment & Plan Note (Signed)
 Underlying CKD stage III A Creatinine peak of 2.65 and down to  2.38.  Baseline creatinine around 1.66.  IV fluid hydration

## 2023-10-29 NOTE — Assessment & Plan Note (Signed)
 Wound 10/28/23 0024 Pressure Injury Coccyx Mid Stage 1 -  Intact skin with non-blanchable redness of a localized area usually over a bony prominence. (Active)   - Wound care

## 2023-10-29 NOTE — Evaluation (Signed)
 Physical Therapy Evaluation Patient Details Name: Joe Coleman MRN: [id number] DOB: 01/14/1946 Today's Date: 10/29/2023  History of Present Illness  Joe Coleman is a 78 y.o. 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   Clinical Impression  Pt A&Ox2, agreeable to PT evaluation, denied pain throughout session. Pt is a questionable historian, reports that at recent baseline he is modI with transfers to/from [facility] at home and states he is IND with basic ADLs. Pt was met seated in recliner at start of session. While setting up the room/pt for transfer recliner > bed, pt spontaneously completed squat-pivot transfer without AD present, required modA to achieve sitting at EOB. Pt was educated on importance of using RW for transfers for max stability and to decrease risk of falls- pt appeared receptive to education. Pt was left supine in bed at end of session, supervision for bed mobility with VC to adhere to NWB precautions, all needs in reach. Pt is displaying deficits in cognition, balance, safety awareness, and functional mobility and would benefit from skilled PT intervention to address listed deficits and increase independence with mobility/ADLs.        If plan is discharge home, recommend the following: A lot of help with walking and/or transfers;A lot of help with bathing/dressing/bathroom;Assistance with cooking/housework;Direct supervision/assist for medications management;Assist for transportation;Help with stairs or ramp for entrance;Supervision due to cognitive status   Can travel by  private vehicle   No    Equipment Recommendations Other (comment) (TBD)  Recommendations for Other Services       Functional Status Assessment Patient has had a recent decline in their functional status and demonstrates the ability to make significant improvements in function in a reasonable and predictable amount of time.     Precautions / Restrictions Precautions Precautions: Fall Recall of Precautions/Restrictions: Impaired Required Braces or Orthoses: Splint/Cast Splint/Cast: RLE long leg splint (prevents knee flexion) Splint/Cast - Date Prophylactic Dressing Applied (if applicable): 10/15/23 Restrictions Weight Bearing Restrictions Per Provider Order: Yes RLE Weight Bearing Per Provider Order: Non weight bearing      Mobility  Bed Mobility Overal bed mobility: Needs Assistance Bed Mobility: Sit to Supine     Supine to sit: Supervision, Used rails     General bed mobility comments: no physical assistance, VC to adhere to NWB on RLE    Transfers Overall transfer level: Needs assistance Equipment used: None Transfers: Bed to chair/wheelchair/BSC       Squat pivot transfers: Mod assist     General transfer comment: while setting up for recliner > bed transfer, pt spontaneously squat-pivoted to bed with no AD, required modA to attain full sitting on EOB    Ambulation/Gait                  Stairs            Wheelchair Mobility     Tilt Bed    Modified Rankin (Stroke Patients Only)       Balance Overall balance assessment: Needs assistance Sitting-balance support: Feet supported Sitting balance-Leahy Scale: Good Sitting balance - Comments: steady static and dynamic  sitting                                     Pertinent Vitals/Pain Pain Assessment Pain Assessment: No/denies pain    Home Living Family/patient expects to be discharged to:: Private residence Living Arrangements: Alone Available Help at Discharge:  Family;Available PRN/intermittently Type of Home: Apartment Home Access: Stairs to enter Entrance Stairs-Rails: None Entrance Stairs-Number of Steps: 1   Home Layout: One level Home Equipment: Rollator (4 wheels);Rolling Walker (2 wheels) Additional Comments: pt is a poor historian, provided inconsistent info about help at home, confirmed that he does live alone    Prior Function Prior Level of Function : Patient poor historian/Family not available             Mobility Comments: pt states that he was able to transfer himself to his wheelchair at home, used [facility] for mobility and says he stayed in the bed most of the day ADLs Comments: reports being IND with basic ADLs     Extremity/Trunk Assessment   Upper Extremity Assessment Upper Extremity Assessment: Defer to OT evaluation    Lower Extremity Assessment Lower Extremity Assessment: Generalized weakness RLE Deficits / Details: RLE in splint RLE: Unable to fully assess due to pain;Unable to fully assess due to immobilization       Communication   Communication Communication: No apparent difficulties Factors Affecting Communication: Reduced clarity of speech (stutter)    Cognition Arousal: Alert Behavior During Therapy: Impulsive   PT - Cognitive impairments: Safety/Judgement, No family/caregiver present to determine baseline                       PT - Cognition Comments: pt very impulsive with mobility, A&Ox2 Following commands: Impaired Following commands impaired: Follows one step commands inconsistently     Cueing Cueing Techniques: Verbal cues, Visual cues     General Comments      Exercises     Assessment/Plan    PT Assessment Patient needs continued PT services  PT Problem List Decreased strength;Decreased range of motion;Decreased activity tolerance;Decreased balance;Decreased mobility;Decreased coordination;Decreased cognition;Decreased knowledge of use of DME;Decreased safety  awareness;Decreased knowledge of precautions;Pain       PT Treatment Interventions DME instruction;Gait training;Functional mobility training;Therapeutic activities;Therapeutic exercise;Balance training;Neuromuscular re-education;Cognitive remediation;Patient/family education;Wheelchair mobility training    PT Goals (Current goals can be found in the Care Plan section)  Acute Rehab PT Goals Patient Stated Goal: to get better PT Goal Formulation: With patient Time For Goal Achievement: 11/12/23 Potential to Achieve Goals: Fair    Frequency Min 2X/week     Co-evaluation               AM-PAC PT 6 Clicks Mobility  Outcome Measure Help needed turning from your back to your side while in a flat bed without using bedrails?: A Little Help needed moving from lying on your back to sitting on the side of a flat bed without using bedrails?: A Little Help needed moving to and from a bed to a chair (including a wheelchair)?: A Little Help needed standing up from a chair using your arms (e.g., wheelchair or bedside chair)?: A Lot Help needed to walk in hospital room?: Total Help needed climbing 3-5 steps with a railing? : Total 6 Click Score: 13    End of Session Equipment Utilized During Treatment: Gait belt Activity Tolerance: Patient tolerated treatment well Patient left: in  bed;with call bell/phone within reach;with bed alarm set Nurse Communication: Mobility status PT Visit Diagnosis: Unsteadiness on feet (R26.81);Muscle weakness (generalized) (M62.81);Difficulty in walking, not elsewhere classified (R26.2);Pain Pain - Right/Left: Right Pain - part of body: Leg    Time: 8671-8656 PT Time Calculation (min) (ACUTE ONLY): 15 min   Charges:   PT Evaluation $PT Eval Low Complexity: 1 Low   PT General Charges $$ ACUTE PT VISIT: 1 Visit         Janell Axe, SPT

## 2023-10-29 NOTE — Evaluation (Signed)
 Occupational Therapy Evaluation Patient Details Name: Joe Coleman MRN: [id number] DOB: February 17, 1945 Today's Date: 10/29/2023   History of Present Illness   Joe Coleman is a 78 y.o. 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   Clinical Impressions Joe Coleman was seen for OT evaluation this date. Prior to hospital admission, pt was recently at home requiring assist for ADLs. Pt lives alone. Pt currently requires CGA for bed>BSC>chair t/f. MAX A for LB access in sitting. Pt would benefit from skilled OT to address noted impairments and functional limitations (see below for any additional details). Upon hospital discharge, recommend OT follow up <3 hours/day.    If plan is discharge home, recommend the following:   Assistance with cooking/housework;Assist for transportation;Help with stairs or ramp for entrance;A little help with walking and/or transfers;A little help with bathing/dressing/bathroom     Functional Status Assessment   Patient has had a recent decline in their functional status and demonstrates the ability to make significant improvements in function in a reasonable and predictable amount of time.     Equipment Recommendations   Other (comment) (defer)     Recommendations for Other Services         Precautions/Restrictions   Precautions Precautions: Fall Recall of Precautions/Restrictions: Impaired Required Braces or Orthoses: Splint/Cast Splint/Cast: RLE long leg splint (prevents knee flexion) Splint/Cast - Date Prophylactic Dressing Applied (if  applicable): 10/15/23 Restrictions Weight Bearing Restrictions Per Provider Order: Yes RLE Weight Bearing Per Provider Order: Non weight bearing     Mobility Bed Mobility Overal bed mobility: Needs Assistance Bed Mobility: Supine to Sit     Supine to sit: Supervision          Transfers Overall transfer level: Needs assistance Equipment used: None Transfers: Bed to chair/wheelchair/BSC     Squat pivot transfers: Contact guard assist              Balance Overall balance assessment: Needs assistance Sitting-balance support: No upper extremity supported, Feet supported Sitting balance-Leahy Scale: Good     Standing balance support: Bilateral upper extremity supported Standing balance-Leahy Scale: Poor                             ADL either performed or assessed with clinical judgement   ADL Overall ADL's : Needs assistance/impaired                                       General ADL Comments: CGA for BSC t/f and chair t/f. MAX A for LB access in sitting     Vision         Perception         Praxis         Pertinent Vitals/Pain Pain Assessment Pain Assessment: Faces Faces Pain Scale: Hurts little more Pain Location: RLE Pain Descriptors / Indicators: Aching Pain Intervention(s): Limited activity within patient's tolerance, Repositioned     Extremity/Trunk Assessment Upper Extremity Assessment Upper Extremity Assessment: Defer  to OT evaluation   Lower Extremity Assessment Lower Extremity Assessment: Generalized weakness RLE Deficits / Details: RLE in splint RLE: Unable to fully assess due to pain;Unable to fully assess due to immobilization       Communication Communication Communication: No apparent difficulties Factors Affecting Communication: Reduced clarity of speech (stutter)   Cognition Arousal: Alert Behavior During Therapy: Flat affect Cognition: No family/caregiver present to determine baseline,  Cognition impaired                               Following commands: Intact       Cueing  General Comments   Cueing Techniques: Verbal cues      Exercises     Shoulder Instructions      Home Living Family/patient expects to be discharged to:: Private residence Living Arrangements: Alone Available Help at Discharge: Family;Available PRN/intermittently Type of Home: Apartment Home Access: Stairs to enter Entrance Stairs-Number of Steps: 1 Entrance Stairs-Rails: None Home Layout: One level     Bathroom Shower/Tub: Tub/shower unit;Walk-in shower   Bathroom Toilet: Standard     Home Equipment: Rollator (4 wheels);Rolling Walker (2 wheels)   Additional Comments: pt is a poor historian, provided inconsistent info about help at home, confirmed that he does live alone      Prior Functioning/Environment Prior Level of Function : Patient poor historian/Family not available             Mobility Comments: pt states that he was able to transfer himself to his wheelchair at home, used [facility] for mobility and says he stayed in the bed most of the day ADLs Comments: reports being IND with basic ADLs    OT Problem List: Decreased strength;Impaired balance (sitting and/or standing);Pain;Decreased knowledge of use of DME or AE   OT Treatment/Interventions: Self-care/ADL training;DME and/or AE instruction;Therapeutic activities;Patient/family education;Balance training      OT Goals(Current goals can be found in the care plan section)   Acute Rehab OT Goals Patient Stated Goal: to go home OT Goal Formulation: With patient Time For Goal Achievement: 11/12/23 Potential to Achieve Goals: Good ADL Goals Pt Will Perform Grooming: with modified independence;sitting Pt Will Perform Lower Body Dressing: with modified independence;sitting/lateral leans Pt Will Transfer to Toilet: with modified independence;squat pivot transfer;bedside commode   OT Frequency:  Min  2X/week    Co-evaluation              AM-PAC OT 6 Clicks Daily Activity     Outcome Measure Help from another person eating meals?: None Help from another person taking care of personal grooming?: A Lot Help from another person toileting, which includes using toliet, bedpan, or urinal?: A Lot Help from another person bathing (including washing, rinsing, drying)?: A Little Help from another person to put on and taking off regular upper body clothing?: A Little Help from another person to put on and taking off regular lower body clothing?: A Little 6 Click Score: 17   End of Session Nurse Communication: Mobility status  Activity Tolerance: Patient tolerated treatment well Patient left: in chair;with call bell/phone within reach;with chair alarm set  OT Visit Diagnosis: Unsteadiness on feet (R26.81);Other abnormalities of gait and mobility (R26.89);Pain                Time: 9049-8978 OT Time Calculation (min): 31 min Charges:  OT General Charges $OT Visit: 1 Visit OT Evaluation $OT Eval Moderate Complexity: 1 Mod OT Treatments $Self  Care/Home Management : 8-22 mins  Elston Slot, M.S. OTR/L  10/29/23, 4:36 PM  ascom [phone]

## 2023-10-29 NOTE — Assessment & Plan Note (Addendum)
 Will get a CT scan of the head.  Could be secondary to Maxipime .  Could be secondary to infection.  So far repeat blood cultures are negative.  Continue to monitor.  Decrease dose of gabapentin .  Replace vitamin B12.

## 2023-10-29 NOTE — Assessment & Plan Note (Signed)
 CK up to 2788.  Could be elevated secondary to daptomycin .  Continue IV fluids.

## 2023-10-29 NOTE — Assessment & Plan Note (Signed)
 Last hemoglobin A1c 7.6.  Patient on sliding scale insulin .

## 2023-10-29 NOTE — Hospital Course (Signed)
 Joe Coleman is a 78 y.o. male with medical history significant for COPD, CKD stage IIIa, T2DM, HTN, LLE DVT on Eliquis , right tibial fracture s/p ORIF 08/25/2023 w/ subsequent wound infection s/p hardware removal 10/15/2023 who was admitted at [facility] with altered mental status and rhabdomyolysis and transferred to [facility] for orthopedic management of his wound.

## 2023-10-29 NOTE — Progress Notes (Signed)
 Pharmacy Antibiotic Note  Joe Coleman is a 78 y.o. 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  Cultures grew out Enterobacter cloacae and Klebsiella pneumoniae. Patient was placed on 6 weeks of daptomycin  and cefepime , now Dcd for elevated CK and worsening AMS. Antibiotics were changed to IV ertapenem  per ID recommendation. Pharmacy has been consulted for ertapenem  dosing.  Plan: Ertapenem  500mg  IV Q24h Trend WBC, fever, renal function F/u cultures, clinical progress      Temp (24hrs), Avg:98 F (36.7 C), Min:97.7 F (36.5 C), Max:98.4 F (36.9 C)  Recent Labs  Lab 10/23/23 0311 10/24/23 0229 10/27/23 1603 10/27/23 1615 10/28/23 0246 10/29/23 0535  WBC 8.6 9.3 13.6*  --  12.2* 10.2  CREATININE 1.42* 1.66* 2.41*  --  2.65* 2.38*  LATICACIDVEN  --   --   --  1.3  --   --     Estimated Creatinine Clearance: 17.1 mL/min (A) (by C-G formula based on SCr of 2.38 mg/dL (H)).    Allergies  Allergen Reactions   Losartan Other (See Comments)    Hyperkalemia (>5.5 multiple times on losartan 25mg )   Chicken Allergy Nausea And Vomiting        Poultry Meal Nausea And Vomiting   Thank you for allowing pharmacy to be a part of this patient's care.  Shelba Collier, PharmD, BCPS Clinical Pharmacist

## 2023-10-29 NOTE — H&P (Addendum)
 History and Physical    Joe Coleman FMW:[id number] DOB: 1945/04/05 DOA: 10/29/2023  PCP: [facility]  Patient coming from: Home  I have personally briefly reviewed patient's old medical records in [facility]  Chief Complaint: Right leg wound  HPI: Joe Coleman is a 78 y.o. male with medical history significant for COPD, CKD stage IIIa, T2DM, HTN, LLE DVT on Eliquis , right tibial fracture s/p ORIF 08/25/2023 w/ subsequent wound infection s/p hardware removal 10/15/2023 who was admitted at [facility] with altered mental status and rhabdomyolysis and transferred to [facility] for orthopedic management of his wound.  Patient was recently admitted 10/14/2023-10/24/2023 for postoperative right tibial hardware associated infection.  He underwent hardware removal, I&D right proximal tibia, skin graft, long-leg splint, and wound VAC placement by [name] on 10/15/2023.  Cultures grew out Enterobacter cloacae and Klebsiella pneumoniae.  Patient was placed on 6 weeks of IV antibiotics with daptomycin  and cefepime .  He was also noted to have LLE DVT and was started on Eliquis .  PT/OT recommended SNF but patient elected to be discharged to home.  Patient was readmitted at [facility] on 10/27/2023 with altered mental status, AKI, rhabdomyolysis.  Creatinine peaked at 2.65 compared to baseline ~1.4-1.6.  CK trended 1699 > 2788.  Altered mental status felt potentially secondary to infection versus side effect of cefepime  in setting of renal dysfunction.  Patient was started on IV fluids.  Antibiotics were changed to IV ertapenem  per ID recommendation.  Gabapentin  dose was decreased.  Regarding his right lower extremity wound, the transferring hospitalist discussed the case with [name] orthopedic surgery, who recommended transferring patient to [facility] so their team can manage the wound.  Workup at [facility] included negative head CT.  Renal ultrasound consistent with medical renal disease, no urinary  tract dilation or shadowing calculi.  Blood cultures collected 10/27/2023 are no growth to date.  UA was not obtained.  Patient seen at bedside on transfer to [facility].  He states that he is feeling well.  He denies fevers, chills, diaphoresis.  He reports good urine output.  He reports occasional shortness of breath, unchanged from baseline.  He also reports occasional memory issues but is oriented to person, place, year, and situation.  Review of Systems: All systems reviewed and are negative except as documented in history of present illness above.   Past Medical History:  Diagnosis Date   Cancer (HCC)    stomach and colon   Diabetes mellitus without complication (HCC)    Type II   Hypertension     Past Surgical History:  Procedure Laterality Date   APPENDECTOMY     COLON SURGERY     HARDWARE REMOVAL Right 10/15/2023   Procedure: REMOVAL, HARDWARE;  Surgeon: [name] Franky SQUIBB, MD;  Location: [facility] OR;  Service: Orthopedics;  Laterality: Right;  IRRIGATION AND DEBRIDEMENT, HARDWARE REMOVAL   ORIF TIBIA FRACTURE Right 08/25/2023   Procedure: OPEN REDUCTION INTERNAL FIXATION (ORIF) TIBIA FRACTURE, RIGHT;  Surgeon: [name] Franky SQUIBB, MD;  Location: [facility] OR;  Service: Orthopedics;  Laterality: Right;    Social History: Social History   Tobacco Use   Smoking status: Former   Smokeless tobacco: Never  Advertising account planner   Vaping status: Never Used  Substance Use Topics   Alcohol use: No   Drug use: No   Allergies  Allergen Reactions   Losartan Other (See Comments)    Hyperkalemia (>5.5 multiple times on losartan 25mg )   Chicken Allergy Nausea And  Vomiting        Poultry Meal Nausea And Vomiting    Family History  Problem Relation Age of Onset   Heart disease Mother    Heart disease Brother      Prior to Admission medications   Medication Sig Start Date End Date Taking? Authorizing Provider  acetaminophen  (TYLENOL ) 325 MG tablet Take 2 tablets (650 mg total) by mouth every 6 (six)  hours as needed for mild pain (pain score 1-3) or fever (or Fever >/= 101). 10/29/23   Josette Ade, MD  albuterol  (PROVENTIL  HFA;VENTOLIN  HFA) 108 (90 Base) MCG/ACT inhaler Inhale 2 puffs into the lungs every 6 (six) hours as needed for wheezing or shortness of breath. 04/01/16   Lang Dover, MD  apixaban  (ELIQUIS ) 5 MG TABS tablet Take 2 tablets (10 mg total) by mouth 2 (two) times daily for 1 day, THEN 1 tablet (5 mg total) 2 (two) times daily. 10/29/23 11/28/23  Josette Ade, MD  bisacodyl  (DULCOLAX) 5 MG EC tablet Take 1 tablet (5 mg total) by mouth daily as needed for moderate constipation. 10/24/23   Corie Vavra, Pranav M, MD  Chlorhexidine  Gluconate Cloth 2 % PADS Apply 6 each topically daily. 10/30/23   Josette Ade, MD  cholecalciferol  (CHOLECALCIFEROL ) 25 MCG tablet Take 1 tablet (1,000 Units total) by mouth daily. 08/28/23   Danton Lauraine LABOR, PA-C  cyanocobalamin  (VITAMIN B12) 1000 MCG tablet Take 1 tablet (1,000 mcg total) by mouth daily. 10/24/23   Tobie Yetta HERO, MD  ertapenem  500 mg in sodium chloride  0.9 % 50 mL Inject 500 mg into the vein daily. 10/29/23   Josette Ade, MD  feeding supplement, GLUCERNA SHAKE, (GLUCERNA SHAKE) LIQD Take 237 mLs by mouth 3 (three) times daily between meals. 09/01/23   Sherrill Cable Latif, DO  ferrous sulfate  325 (65 FE) MG tablet Take 1 tablet (325 mg total) by mouth daily with breakfast. 10/25/23   Tobie Yetta HERO, MD  gabapentin  (NEURONTIN ) 100 MG capsule Take 1 capsule (100 mg total) by mouth at bedtime. 10/29/23   Josette Ade, MD  guaiFENesin  (MUCINEX ) 600 MG 12 hr tablet Take 1 tablet (600 mg total) by mouth 2 (two) times daily. 10/24/23 10/23/24  Tobie Yetta HERO, MD  insulin  aspart (NOVOLOG ) 100 UNIT/ML injection Inject 0-5 Units into the skin at bedtime. 10/29/23   Josette Ade, MD  insulin  aspart (NOVOLOG ) 100 UNIT/ML injection Inject 0-9 Units into the skin 3 (three) times daily with meals. 10/29/23   Josette Ade, MD   methocarbamol  (ROBAXIN ) 500 MG tablet Take 1 tablet (500 mg total) by mouth every 6 (six) hours as needed for muscle spasms. 10/24/23   Tobie Yetta HERO, MD  ondansetron  (ZOFRAN ) 4 MG tablet Take 1 tablet (4 mg total) by mouth every 6 (six) hours as needed for nausea. 10/29/23   Josette Ade, MD  oxyCODONE -acetaminophen  (PERCOCET) 5-325 MG tablet Take 1 tablet by mouth every 6 (six) hours as needed for severe pain (pain score 7-10) or moderate pain (pain score 4-6). 10/24/23 10/23/24  Chardae Mulkern, Pranav M, MD  polyethylene glycol (MIRALAX  / GLYCOLAX ) 17 g packet Take 17 g by mouth daily. 09/01/23   Sherrill Cable Latif, DO  sodium chloride  0.9 % infusion Inject 75 mLs into the vein continuous. 10/29/23   Josette Ade, MD  sodium chloride  flush (NS) 0.9 % SOLN 10-40 mLs by Intracatheter route every 12 (twelve) hours. 10/29/23   Josette Ade, MD  TRELEGY RUFINO 200-62.5-25 MCG/ACT AEPB Inhale 1 puff into the lungs daily.  07/17/23   [provider]    Physical Exam: Vitals:   10/29/23 2001  BP: (!) 141/65  Pulse: 91  Resp: 15  Temp: 98.4 F (36.9 C)  TempSrc: Oral  SpO2: 98%   Constitutional: Resting in bed with head elevated.  NAD, calm, comfortable Eyes: EOMI, lids and conjunctivae normal ENMT: Mucous membranes are moist. Posterior pharynx clear of any exudate or lesions.Normal dentition.  Neck: normal, supple, no masses. Respiratory: clear to auscultation bilaterally, no wheezing, no crackles. Normal respiratory effort. No accessory muscle use.  Cardiovascular: Regular rate and rhythm, no murmurs / rubs / gallops. No extremity edema. 2+ pedal pulses. Abdomen: no tenderness, no masses palpated. Musculoskeletal: no clubbing / cyanosis.  RLE cast in place. Skin: Warm, dry Neurologic: Sensation intact. Strength equal bilaterally. Psychiatric: Alert and oriented x 3. Normal mood.   EKG: Not performed.  Assessment/Plan Principal Problem:   Acute kidney injury superimposed on  chronic kidney disease Active Problems:   Acute metabolic encephalopathy   Rhabdomyolysis   Hardware complicating wound infection   Type 2 diabetes mellitus with peripheral neuropathy (HCC)   Chronic obstructive pulmonary disease (COPD) (HCC)   History of DVT (deep vein thrombosis)   Normocytic anemia   Joe Coleman is a 78 y.o. male with medical history significant for COPD, CKD stage IIIa, T2DM, HTN, LLE DVT on Eliquis , right tibial fracture s/p ORIF 08/25/2023 w/ subsequent wound infection s/p hardware removal 10/15/2023 who was admitted at [facility] with altered mental status and rhabdomyolysis and transferred to [facility] for orthopedic management of his wound.  Assessment and Plan: AKI on CKD stage IIIa: Creatinine peak of 2.65 compared to baseline ~1.4-1.6.  Improved to 2.38 this morning.  Continue IV fluid hydration with normal saline, obtain UA.  Renal ultrasound 9/23 consistent with medical renal disease.  Rhabdomyolysis: CK up to 2788.  Could be secondary to daptomycin  which has since been discontinued.  Continue IV fluid hydration overnight.  Repeat CK level in AM.  Acute metabolic encephalopathy: Initially admitted to [facility] with encephalopathy, mentation appears to be near baseline now.  This was thought secondary to AKI versus adverse effect of cefepime  which has since been discontinued.  Gabapentin  dose was decreased.  Right tibial hardware associated infection s/p hardware removal and skin graft substitute placement 10/15/2023: Cast and wound VAC remain in place.  Orthopedics [name] to manage wound while in hospital.  Previous antibiotics, daptomycin  and cefepime  were discontinued due to suspected adverse effects.  Continue IV ertapenem .  Left lower extremity DVT: Patient was found to have acute distal LLE DVT on 10/23/2023.  Continue Eliquis .  Type 2 diabetes: Continue SSI.  COPD: Stable, continue Trelegy Ellipta and albuterol  as needed.  Normocytic  anemia: Hemoglobin stable at 8.3.  Likely multifactorial related to B12/iron  deficiency and anemia of chronic disease.  Continue B12 and iron  supplement.   DVT prophylaxis:  apixaban  (ELIQUIS ) tablet 5 mg   Code Status: Full code Family Communication: Discussed with patient, he has discussed with family Disposition Plan: From home, dispo pending clinical progress Consults called: Orthopedics Severity of Illness: The appropriate patient status for this patient is INPATIENT. Inpatient status is judged to be reasonable and necessary in order to provide the required intensity of service to ensure the patient's safety. The patient's presenting symptoms, physical exam findings, and initial radiographic and laboratory data in the context of their chronic comorbidities is felt to place them at high risk for further clinical deterioration. Furthermore, it is not anticipated  that the patient will be medically stable for discharge from the hospital within 2 midnights of admission.   * I certify that at the point of admission it is my clinical judgment that the patient will require inpatient hospital care spanning beyond 2 midnights from the point of admission due to high intensity of service, high risk for further deterioration and high frequency of surveillance required.Joe Jorie Blanch MD [facility]  If 7PM-7AM, please contact night-coverage [email or web address]  10/29/2023, 8:57 PM

## 2023-10-29 NOTE — Assessment & Plan Note (Addendum)
 Case discussed with [name] orthopedic surgery at [facility] who did the procedure and okay with changing dressings daily with 4 x 4 dressing.  He was okay with the way the cast looked.  Will need follow-up as outpatient.  Will have ID adjust antibiotics since daptomycin  was held and Maxipime  could be causing altered mental status.  IV invanz  ordered

## 2023-10-29 NOTE — Assessment & Plan Note (Signed)
 On sliding scale insulin .  Decrease dose of gabapentin 

## 2023-10-29 NOTE — Assessment & Plan Note (Signed)
 Hemoglobin 8.3.  Continue to monitor.  Likely secondary to B12 deficiency and secondary to IV fluid hydration.  Ferritin elevated which could go along with anemia of chronic disease.

## 2023-10-29 NOTE — Assessment & Plan Note (Signed)
 Replace IM B12 and continue oral B12.

## 2023-10-29 NOTE — Progress Notes (Signed)
 Ortho Trauma Note  I was contacted regarding this patient for his right leg. He has a postoperative infection and underwent removal of hardware and skin graft substitute placement. He was to follow up with me this week for removal of his dressing. He ended up getting admitted. I would recommend wound vac until Friday and then can do daily dressing changes with KY Jelly, 4x4s and ABD pad. Would recommend tranisitioning to long leg posterior orthoplast splint from heel to midthigh that can be removed for dressing changes and reapplied. The complexity of his wound may necessitate transfer to [facility] as I do not have privileges at [facility] so that my team may closely manage his wound if his admission will be prolonged.  Franky MYRTIS Light, MD [facility] [phone] (office) [email or web address]

## 2023-10-30 ENCOUNTER — Inpatient Hospital Stay (HOSPITAL_COMMUNITY)

## 2023-10-30 DIAGNOSIS — T84629A Infection and inflammatory reaction due to internal fixation device of unspecified bone of leg, initial encounter: Secondary | ICD-10-CM | POA: Diagnosis not present

## 2023-10-30 DIAGNOSIS — R4182 Altered mental status, unspecified: Secondary | ICD-10-CM | POA: Diagnosis not present

## 2023-10-30 DIAGNOSIS — M868X6 Other osteomyelitis, lower leg: Secondary | ICD-10-CM | POA: Diagnosis not present

## 2023-10-30 DIAGNOSIS — N189 Chronic kidney disease, unspecified: Secondary | ICD-10-CM | POA: Diagnosis not present

## 2023-10-30 DIAGNOSIS — N179 Acute kidney failure, unspecified: Secondary | ICD-10-CM | POA: Diagnosis not present

## 2023-10-30 LAB — BASIC METABOLIC PANEL WITH GFR
Anion gap: 7 (ref 5–15)
BUN: 43 mg/dL — ABNORMAL HIGH (ref 8–23)
CO2: 19 mmol/L — ABNORMAL LOW (ref 22–32)
Calcium: 8.6 mg/dL — ABNORMAL LOW (ref 8.9–10.3)
Chloride: 110 mmol/L (ref 98–111)
Creatinine, Ser: 2.48 mg/dL — ABNORMAL HIGH (ref 0.61–1.24)
GFR, Estimated: 26 mL/min — ABNORMAL LOW (ref >60)
Glucose, Bld: 121 mg/dL — ABNORMAL HIGH (ref 70–99)
Potassium: 4.2 mmol/L (ref 3.5–5.1)
Sodium: 136 mmol/L (ref 135–145)

## 2023-10-30 LAB — CBC
HCT: 29 % — ABNORMAL LOW (ref 39.0–52.0)
Hemoglobin: 8.8 g/dL — ABNORMAL LOW (ref 13.0–17.0)
MCH: 27.2 pg (ref 26.0–34.0)
MCHC: 30.3 g/dL (ref 30.0–36.0)
MCV: 89.5 fL (ref 80.0–100.0)
Platelets: 291 K/uL (ref 150–400)
RBC: 3.24 MIL/uL — ABNORMAL LOW (ref 4.22–5.81)
RDW: 16.2 % — ABNORMAL HIGH (ref 11.5–15.5)
WBC: 9.6 K/uL (ref 4.0–10.5)
nRBC: 0 % (ref 0.0–0.2)

## 2023-10-30 LAB — GLUCOSE, CAPILLARY
Glucose-Capillary: 118 mg/dL — ABNORMAL HIGH (ref 70–99)
Glucose-Capillary: 180 mg/dL — ABNORMAL HIGH (ref 70–99)
Glucose-Capillary: 107 mg/dL — ABNORMAL HIGH (ref 70–99)

## 2023-10-30 LAB — CK: Total CK: 2145 U/L — ABNORMAL HIGH (ref 49–397)

## 2023-10-30 MED ORDER — LINEZOLID 600 MG PO TABS
600.0000 mg | ORAL_TABLET | Freq: Two times a day (BID) | ORAL | Status: DC
  Administered 2023-10-30 – 2023-11-04 (×11): 600 mg via ORAL
  Filled 2023-10-30 (×12): qty 1

## 2023-10-30 NOTE — Consult Note (Signed)
 [facility]    Date of Admission:  10/29/2023     Total days of antibiotics 4               Reason for Consult: Adverse drug reaction to daptomycin  Referring Provider: Dr. Leotis  Primary Care Provider: [facility]   ASSESSMENT:  Joe Coleman is an 78 year old Caucasian male with recent Klebsiella pneumoniae and Enterobacter cloacae postoperative wound infection complicated by hardware status post removal now presenting with altered mental status and found to have elevated CK levels likely consistent with daptomycin  induced myopathy/rhabdomyolysis.  Antibiotics have been changed to ertapenem .  Blood cultures remain without growth.  CK levels are trending down with stopping of medication.  Discussed plan of care to continue ertapenem  and add linezolid  through 11/25/2023.  Continue wound care per orthopedics.  Standard/universal precautions.  Spoke with niece on the phone and requesting he possibly be moved closer to [place]. Remaining medical and supportive care per internal medicine.  PLAN:  Continue current dose of ertapenem  and start linezolid . Postoperative wound care per orthopedics. Monitor blood cultures for bacteremia. Standard/universal precautions. Remaining medical and supportive care per internal medicine.   Principal Problem:   Acute kidney injury superimposed on chronic kidney disease Active Problems:   Acute metabolic encephalopathy   Type 2 diabetes mellitus with peripheral neuropathy (HCC)   Chronic obstructive pulmonary disease (COPD) (HCC)   Rhabdomyolysis   History of DVT (deep vein thrombosis)   Normocytic anemia   Hardware complicating wound infection    apixaban   5 mg Oral BID   budesonide -glycopyrrolate -formoterol   2 puff Inhalation BID   cyanocobalamin   1,000 mcg Oral Daily   feeding supplement (GLUCERNA SHAKE)  237 mL Oral TID BM   ferrous sulfate   325 mg Oral Q breakfast   gabapentin   100 mg Oral  QHS   insulin  aspart  0-5 Units Subcutaneous QHS   insulin  aspart  0-9 Units Subcutaneous TID WC   polyethylene glycol  17 g Oral Daily     HPI: Joe Coleman is a 78 y.o. male with previous medical history of type 2 diabetes, hypertension, tobacco use, and traumatic injury requiring ORIF in July 2025 complicated by wound dehiscence and recently hospitalized presenting to the ED with worsening pain in his right lower leg and altered mental status.  Joe Coleman is known to the ID team having been seen during hospitalization from 10/14/2023 to 10/24/2023 with right leg wound with infected hardware secondary to Enterobacter cloacae and Klebsiella pneumoniae.  Status post right tibial hardware removal on 10/15/2023.  Last seen by Dr. Dennise on 10/18/2023 with plan for 6 weeks of daptomycin  and cefepime .  Joe Coleman returned to the ED on 10/27/2023 with altered mental status having been receiving daptomycin  and cefepime  as prescribed.  Initially afebrile with leukocytosis of 12,200.  CT head with no evidence of acute intracranial abnormality.  CK levels were found to be elevated at 1699 and 2788 consistent with daptomycin  induced myopathy/rhabdomyolysis.  Antibiotics were discontinued and changed to ertapenem .  Blood cultures drawn on 10/27/2023 without growth.  Review of Systems: Review of Systems  Constitutional:  Negative for chills, fever and weight loss.  Respiratory:  Negative for cough, shortness of breath and wheezing.   Cardiovascular:  Negative for chest pain and leg swelling.  Gastrointestinal:  Negative for abdominal pain, constipation, diarrhea, nausea and vomiting.  Skin:  Negative for rash.     Past Medical History:  Diagnosis Date  Cancer (HCC)    stomach and colon   Diabetes mellitus without complication (HCC)    Type II   Hypertension     Social History   Tobacco Use   Smoking status: Former   Smokeless tobacco: Never  Advertising account planner   Vaping status: Never Used  Substance  Use Topics   Alcohol use: No   Drug use: No    Family History  Problem Relation Age of Onset   Heart disease Mother    Heart disease Brother     Allergies  Allergen Reactions   Losartan Other (See Comments)    Hyperkalemia (>5.5 multiple times on losartan 25mg )   Chicken Allergy Nausea And Vomiting        Poultry Meal Nausea And Vomiting    OBJECTIVE: Blood pressure (!) 160/81, pulse 86, temperature 98.3 F (36.8 C), temperature source Oral, resp. rate 16, SpO2 97%.  Physical Exam Constitutional:      General: He is not in acute distress.    Appearance: He is well-developed.  Cardiovascular:     Rate and Rhythm: Normal rate and regular rhythm.     Heart sounds: Normal heart sounds.  Pulmonary:     Effort: Pulmonary effort is normal.     Breath sounds: Normal breath sounds.  Musculoskeletal:     Comments: Wound vac in place.   Skin:    General: Skin is warm and dry.  Neurological:     Mental Status: He is alert and oriented to person, place, and time.  Psychiatric:        Mood and Affect: Mood normal.     Lab Results Lab Results  Component Value Date   WBC 9.6 10/30/2023   HGB 8.8 (L) 10/30/2023   HCT 29.0 (L) 10/30/2023   MCV 89.5 10/30/2023   PLT 291 10/30/2023    Lab Results  Component Value Date   CREATININE 2.48 (H) 10/30/2023   BUN 43 (H) 10/30/2023   NA 136 10/30/2023   K 4.2 10/30/2023   CL 110 10/30/2023   CO2 19 (L) 10/30/2023    Lab Results  Component Value Date   ALT 6 10/16/2023   AST 14 (L) 10/16/2023   ALKPHOS 67 10/16/2023   BILITOT 0.6 10/16/2023     Microbiology: Recent Results (from the past 240 hours)  Culture, blood (routine x 2)     Status: None (Preliminary result)   Collection Time: 10/27/23 10:19 PM   Specimen: BLOOD  Result Value Ref Range Status   Specimen Description BLOOD LEFT ARM  Final   Special Requests   Final    BOTTLES DRAWN AEROBIC AND ANAEROBIC Blood Culture adequate volume   Culture   Final    NO  GROWTH 3 DAYS Performed at [facility], [address], [place], [place] [address]    Report Status PENDING  Incomplete  Culture, blood (routine x 2)     Status: None (Preliminary result)   Collection Time: 10/27/23 10:20 PM   Specimen: BLOOD RIGHT ARM  Result Value Ref Range Status   Specimen Description BLOOD RIGHT ARM  Final   Special Requests   Final    BOTTLES DRAWN AEROBIC AND ANAEROBIC Blood Culture adequate volume   Culture   Final    NO GROWTH 3 DAYS Performed at [facility], [address] [place], [place] [address]    Report Status PENDING  Incomplete     Cathlyn July, NP [facility]  10/30/2023  9:39 AM

## 2023-10-30 NOTE — Progress Notes (Signed)
 Orthopaedic Trauma Progress Note  SUBJECTIVE: Patient was admitted 10/14/2023-10/24/2023 for postoperative right tibial hardware associated infection.  He underwent hardware removal, I&D right proximal tibia, skin graft, long-leg splint, and wound VAC placement by [name] on 10/15/2023.  Cultures grew out Enterobacter cloacae and Klebsiella pneumoniae.  Patient was placed on 6 weeks of IV antibiotics with daptomycin  and cefepime .  He was also noted to have LLE DVT and was started on Eliquis .  PT/OT recommended SNF but patient elected to be discharged to home. Patient was scheduled to follow-up in OTS clinic on 10/27/2023 for wound VAC change but unfortunately did not show up for his appointment.  He was readmitted at [facility] on 10/27/2023 with altered mental status, AKI, rhabdomyolysis. Altered mental status felt potentially secondary to infection versus side effect of cefepime  in setting of renal dysfunction. Patient was started on IV fluids. Antibiotics were changed to IV ertapenem  per ID recommendation. Regarding his right lower extremity, he underwent wound VAC change by wound care RN on 10/28/2023.  He has remained in his long-leg splint. It was recommended patient be transferred to [facility] for further management of his wound. Patient seen this morning on 5N.  Currently eating breakfast.  Notes some intermittent pain in the leg but overall has been well-controlled. Patient denies chest pain, SOB, nausea/vomiting. No other complaints. No family at bedside currently  OBJECTIVE:  Vitals:   10/29/23 2001 10/30/23 0431  BP: (!) 141/65 (!) 160/81  Pulse: 91 86  Resp: 15 16  Temp: 98.4 F (36.9 C) 98.3 F (36.8 C)  SpO2: 98% 97%    Opiates Today (MME): Today's  total administered Morphine  Milligram Equivalents: 10 Opiates Yesterday (MME): Yesterday's total administered Morphine  Milligram Equivalents: 15  General: Sitting up in bed eating breakfast.  NAD Respiratory: No increased work of breathing.   Operative Extremity (RLE): Long-leg splint in place.  Wound VAC with good seal and function, no output in canister.  Nontender to the thigh.  Able to actively wiggle toes. Endorses sensation over the dorsal and plantar aspect off foot. + DP pulse  IMAGING: Will order repeat tibia imaging today  LABS:  Results for orders placed or performed during the hospital encounter of 10/29/23 (from the past 24 hours)  Urinalysis, w/ Reflex to Culture (Infection Suspected) -Urine, Clean Catch     Status: Abnormal   Collection Time: 10/29/23  6:45 PM  Result Value Ref Range   Specimen Source URINE, CLEAN CATCH    Color, Urine YELLOW YELLOW   APPearance HAZY (A) CLEAR   Specific Gravity, Urine 1.008 1.005 - 1.030   pH 5.0 5.0 - 8.0   Glucose, UA NEGATIVE NEGATIVE mg/dL   Hgb urine dipstick LARGE (A) NEGATIVE   Bilirubin Urine NEGATIVE NEGATIVE   Ketones, ur NEGATIVE NEGATIVE mg/dL   Protein, ur 30 (A) NEGATIVE mg/dL   Nitrite NEGATIVE NEGATIVE   Leukocytes,Ua NEGATIVE NEGATIVE   RBC / HPF 0-5 0 - 5 RBC/hpf   WBC, UA 0-5 0 - 5 WBC/hpf   Bacteria, UA RARE (A) NONE SEEN   Squamous Epithelial / HPF 0-5 0 - 5 /HPF   Amorphous Crystal PRESENT   Glucose, capillary     Status: Abnormal   Collection Time: 10/29/23  9:28 PM  Result Value Ref Range   Glucose-Capillary 212 (H) 70 - 99 mg/dL  CBC     Status: Abnormal   Collection Time: 10/30/23  5:09 AM  Result Value Ref Range   WBC 9.6 4.0 - 10.5  K/uL   RBC 3.24 (L) 4.22 - 5.81 MIL/uL   Hemoglobin 8.8 (L) 13.0 - 17.0 g/dL   HCT 70.9 (L) 60.9 - 47.9 %   MCV 89.5 80.0 - 100.0 fL   MCH 27.2 26.0 - 34.0 pg   MCHC 30.3 30.0 - 36.0 g/dL   RDW 83.7 (H) 88.4 - 84.4 %   Platelets 291 150 - 400 K/uL   nRBC 0.0 0.0 - 0.2 %  Basic metabolic panel     Status: Abnormal   Collection Time: 10/30/23  5:09 AM  Result Value Ref Range   Sodium 136 135 - 145 mmol/L   Potassium 4.2 3.5 - 5.1 mmol/L   Chloride 110 98 - 111 mmol/L   CO2 19 (L) 22 - 32 mmol/L    Glucose, Bld 121 (H) 70 - 99 mg/dL   BUN 43 (H) 8 - 23 mg/dL   Creatinine, Ser 7.51 (H) 0.61 - 1.24 mg/dL   Calcium 8.6 (L) 8.9 - 10.3 mg/dL   GFR, Estimated 26 (L) >60 mL/min   Anion gap 7 5 - 15  CK     Status: Abnormal   Collection Time: 10/30/23  5:09 AM  Result Value Ref Range   Total CK 2,145 (H) 49 - 397 U/L  Glucose, capillary     Status: Abnormal   Collection Time: 10/30/23  5:44 AM  Result Value Ref Range   Glucose-Capillary 118 (H) 70 - 99 mg/dL    ASSESSMENT: Joe Coleman is a 78 y.o. male s/p ground level fall 08/24/2023 Procedures:  OPEN REDUCTION INTERNAL FIXATION  RIGHT TIBIA FRACTURE 08/25/23 REMOVAL RIGHT TIBIAL HARDWARE WITH IRRIGATION DEBRIDEMENT AND PLACEMENT OF SKIN GRAFT SUBSTITUTE AND WOUND VAC 10/15/2023 APPLICATION OF LONG-LEG SPLINT RIGHT LOWER EXTREMITY 10/15/2023  CV/Blood loss: Hemoglobin 8.8 this morning.  Stable  PLAN: Weightbearing: NWB RLE ROM: Maintain long-leg splint Incisional and dressing care: Maintain splint and wound VAC Showering: Hold off on showering.  Keep splint covered and dry when bathing  orthopedic device(s): Wound VAC in long-leg splint RLE Pain management: Continue current regimen VTE treatment: Eliquis , SCDs ID: Ertapenem  and Linezolid  per ID Foley/Lines:  No foley, KVO IVFs Impediments to Fracture Healing: Infection.  Vit D level 28, continue on supplementation supplementation  Dispo: Will plan to remove wound VAC and change long-leg splint at bedside tomorrow 10/31/2023.  Have ordered repeat right tibia x-ray today to evaluate fracture healing.  Continue PT/OT as able, currently recommending patient be discharged to SNF.  Follow - up plan: 7 to 10 days after discharge for wound check   Contact information:  Franky Light MD, Lauraine Moores PA-C. After hours and holidays please check [email or web address] for group call information for [facility]   Lauraine PATRIC Moores, PA-C [phone] (office) [email or web address]

## 2023-10-30 NOTE — TOC Initial Note (Signed)
 Transition of Care [facility]) - Initial/Assessment Note    Patient Details  Name: Joe Coleman MRN: [id number] Date of Birth: 02/28/1945  [facility] CM/SW Contact:    Rosalva Jon Bloch, RN Phone Number: 10/30/2023, 3:34 PM  Clinical Narrative:                 Readmit. Presents with AMS and rhabdomyolysis. Transferred from [facility]. From home alone. Supportive family. Pt gives NCM permission to speak with [name] Salt (Niece) [phone] regarding D/C planning and transition of care needs. PTA active with [facility], RN,PT,OT. Pt with rolling  walker , wheelchair and bedside commode at home.  PT/OT evals pending ....   Pt states will be agreeable to SNF/ rehab if needed.   [facility] team following and will assist with needs.  Expected Discharge Plan: Skilled Nursing Facility Barriers to Discharge: Continued Medical Work up   Patient Goals and CMS Choice            Expected Discharge Plan and Services   Discharge Planning Services: CM Consult   Living arrangements for the past 2 months: Apartment                                      Prior Living Arrangements/Services Living arrangements for the past 2 months: Apartment Lives with:: Self Patient language and need for interpreter reviewed:: Yes Do you feel safe going back to the place where you live?: Yes      Need for Family Participation in Patient Care: Yes (Comment) Care giver support system in place?: No (comment) Current home services: DME, Home OT, Home PT, Home RN Criminal Activity/Legal Involvement Pertinent to Current Situation/Hospitalization: No - Comment as needed  Activities of Daily Living      Permission Sought/Granted   Permission granted to share information with : Yes, Verbal Permission Granted  Share Information with NAME: [name] Salt  Niece  [phone]           Emotional Assessment Appearance:: Appears stated age Attitude/Demeanor/Rapport: Engaged Affect (typically  observed): Accepting Orientation: : Oriented to Self, Oriented to Place, Oriented to  Time, Oriented to Situation Alcohol / Substance Use: Not Applicable Psych Involvement: No (comment)  Admission diagnosis:  Surgical complication [T81.9XXA] Acute kidney injury superimposed on chronic kidney disease [N17.9, N18.9] Patient Active Problem List   Diagnosis Date Noted   Hardware complicating wound infection 10/29/2023   Uncontrolled type 2 diabetes mellitus with hyperglycemia, without long-term current use of insulin  (HCC) 10/29/2023   Type 2 diabetes mellitus with peripheral neuropathy (HCC) 10/28/2023   GERD without esophagitis 10/28/2023   Chronic obstructive pulmonary disease (COPD) (HCC) 10/28/2023   Cellulitis of right leg 10/28/2023   Rhabdomyolysis 10/28/2023   Pressure injury of skin 10/28/2023   History of DVT (deep vein thrombosis) 10/28/2023   Normocytic anemia 10/28/2023   Acute metabolic encephalopathy 10/27/2023   Acute kidney injury superimposed on chronic kidney disease 10/27/2023   Open knee wound, right, sequela 10/14/2023   Malnutrition of moderate degree 08/30/2023   Tibia/fibula fracture 08/24/2023   Cellulitis of right arm 08/10/2023   Eye pain, bilateral 07/26/2022   Fall 07/26/2022   Lymphopenia 07/26/2022   Shortness of breath 05/21/2022   COVID-19 virus infection 03/17/2020   COPD with acute exacerbation (HCC) 03/17/2020   Type 2 diabetes mellitus (HCC) 03/17/2020   HTN (hypertension) 03/17/2020   Vitamin B12 deficiency 10/05/2019  Erectile dysfunction 11/10/2017   Chronic kidney disease (CKD), stage III (moderate) (HCC) 09/19/2017   Mild intermittent asthma without complication 09/19/2017   Chronic cough 10/22/2016   Epigastric pain 06/30/2015   Chronic bilateral low back pain without sciatica 05/31/2015   Neuropathic pain 03/01/2015   Benign prostatic hyperplasia with lower urinary tract symptoms 04/06/2014   Adenomatous colon polyp 08/31/2013    Depression 06/21/2013   Foot pain, bilateral 05/07/2013   Insomnia 03/24/2013   Vitiligo 12/23/2012   Primary localized osteoarthrosis, lower leg 01/15/2011   Pain medication agreement broken 09/19/2010   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 [place] [address] Phone: [phone] Fax: [phone]  [facility] [address] [place] [address] Phone: [phone] Fax: [phone]     Social Drivers of Health (SDOH) Social History: SDOH Screenings   Food Insecurity: No Food Insecurity (10/29/2023)  Housing: Low Risk  (10/29/2023)  Transportation Needs: No Transportation Needs (10/29/2023)  Utilities: Not At Risk (10/29/2023)  Financial Resource Strain: Medium Risk (05/02/2021)   Received from [facility]  Physical Activity: Inactive (08/25/2018)   Received from [facility]  Social Connections: Unknown (10/29/2023)  Stress: No Stress Concern Present (08/25/2018)   Received from [facility]  Tobacco Use: Medium Risk (10/27/2023)  Health Literacy: High Risk (05/04/2022)   Received from [facility]   SDOH Interventions:     Readmission Risk Interventions    09/01/2023   12:00 PM  Readmission Risk Prevention Plan  Transportation Screening Complete  Medication Review (RN Care Manager) Complete  PCP or Specialist appointment within 3-5 days of discharge Complete  HRI or Home Care Consult Complete  SW Recovery Care/Counseling Consult Complete  Palliative Care Screening Not Applicable  Skilled Nursing Facility Complete

## 2023-10-30 NOTE — Plan of Care (Signed)
  Problem: Pain Managment: Goal: General experience of comfort will improve and/or be controlled Outcome: Progressing   Problem: Safety: Goal: Ability to remain free from injury will improve Outcome: Progressing

## 2023-10-30 NOTE — Progress Notes (Signed)
 PROGRESS NOTE    Joe Coleman  FMW:[id number] DOB: 12/10/1945 DOA: 10/29/2023 PCP: [facility]   Brief Narrative:  This 78 yrs old male with medical history significant for COPD, CKD stage IIIa, T2DM, HTN, LLE DVT on Eliquis , right tibial fracture s/p ORIF 08/25/2023 w/ subsequent wound infection s/p hardware removal 10/15/2023 who was admitted at [facility] with altered mental status and rhabdomyolysis and transferred to [facility] for orthopedic management of his wound.   Patient was recently admitted 10/14/2023-10/24/2023 for postoperative right tibial hardware associated infection.  He underwent hardware removal, I&D right proximal tibia, skin graft, long-leg splint, and wound VAC placement by [name] on 10/15/2023.  Cultures grew out Enterobacter cloacae and Klebsiella pneumoniae.  Patient was placed on 6 weeks of IV antibiotics with daptomycin  and cefepime .  He was also noted to have LLE DVT and was started on Eliquis .  PT/OT recommended SNF but patient elected to be discharged to home   Patient is found to have altered mental status, AKI, rhabdomyolysis.  Altered mental status felt potentially secondary to infection versus side effects of her cefepime  in the setting of renal dysfunction.  Antibiotics changed to IV ertapenem  as per ID recommendation.  Patient was evaluated by orthopedics [name].  CT head negative for acute intracranial abnormalities.  Renal ultrasound consistent medical renal disease. Patient is scheduled for wound VAC removal and change along leg splint at bedside tomorrow.   Assessment & Plan:   Principal Problem:   Acute kidney injury superimposed on chronic kidney disease Active Problems:   Acute metabolic encephalopathy   Rhabdomyolysis   Hardware complicating wound infection   Type 2 diabetes mellitus with peripheral neuropathy (HCC)   Chronic obstructive pulmonary disease (COPD) (HCC)   History of DVT (deep vein thrombosis)   Normocytic  anemia   AKI on CKD stage IIIa: Baseline creatinine 1.4-1.6, noted with creatinine 2.65. Continue IV fluid hydration with normal saline. Renal ultrasound 9/23 consistent with medical renal disease. UA shows proteinuria.   Rhabdomyolysis: CK up to 2788.  Could be secondary to daptomycin  which has since been discontinued.   Continue IV fluid hydration overnight.  CK level trending down.   Acute metabolic encephalopathy: >Improving Initially admitted to [facility] with encephalopathy, mentation appears to be near baseline now.   This was thought secondary to AKI versus adverse effect of cefepime  which has since been discontinued.   Gabapentin  dose was decreased. Mental status seems improved. CT head ruled out acute stroke   Right tibial hardware associated infection: s/p hardware removal and skin graft substitute placement 10/15/2023: Cast and wound VAC remain in place.  Orthopedics [name] to manage wound while in hospital.   Previous antibiotics, daptomycin  and cefepime  were discontinued due to suspected adverse effects.   Continue IV ertapenem . Patient is scheduled for wound VAC removal and change along leg splint at bedside tomorrow.  Left lower extremity DVT: Acute distal LLE DVT on 10/23/2023.  Continue Eliquis .   Type 2 diabetes: Carb modified diet, continue SSI.   COPD: Stable, continue Trelegy Ellipta and albuterol  as needed.   Normocytic anemia: Hemoglobin stable at 8.3.   Likely multifactorial related to B12/iron  deficiency and anemia of chronic disease.   Continue B12 and iron  supplement.   DVT prophylaxis: Eliquis  Code Status: Full code Family Communication: No family at bedside Disposition Plan:    Status is: Inpatient Remains inpatient appropriate because: Severity of illness    Consultants:  Orthopedics  Procedures:  Antimicrobials: Anti-infectives (From admission,  onward)    Start     Dose/Rate Route Frequency Ordered Stop   10/30/23 1200  linezolid   (ZYVOX ) tablet 600 mg        600 mg Oral Every 12 hours 10/30/23 1101     10/29/23 2200  ertapenem  (INVANZ ) 500 mg in sodium chloride  0.9 % 50 mL IVPB        500 mg 100 mL/hr over 30 Minutes Intravenous Every 24 hours 10/29/23 2026         Subjective: Patient was seen and examined at bedside.  Overnight events noted. Patient reports having pain in the right lower extremity.   Objective: Vitals:   10/29/23 2001 10/30/23 0431  BP: (!) 141/65 (!) 160/81  Pulse: 91 86  Resp: 15 16  Temp: 98.4 F (36.9 C) 98.3 F (36.8 C)  TempSrc: Oral Oral  SpO2: 98% 97%    Intake/Output Summary (Last 24 hours) at 10/30/2023 1240 Last data filed at 10/30/2023 0946 Gross per 24 hour  Intake 600 ml  Output 625 ml  Net -25 ml   There were no vitals filed for this visit.  Examination:  General exam: Appears calm and comfortable , deconditioned, not in any acute distress. Respiratory system: Clear to auscultation. Respiratory effort normal.  RR 14 Cardiovascular system: S1 & S2 heard, RRR. No JVD, murmurs, rubs, gallops or clicks.  Gastrointestinal system: Abdomen is non distended, soft and non tender. Normal bowel sounds heard. Central nervous system: Alert and oriented x 3. No focal neurological deficits. Extremities: RLE with wound VAC covered in dressing. Skin: No rashes, lesions or ulcers Psychiatry: Judgement and insight appear normal. Mood & affect appropriate.     Data Reviewed: I have personally reviewed following labs and imaging studies  CBC: Recent Labs  Lab 10/24/23 0229 10/27/23 1603 10/28/23 0246 10/29/23 0535 10/30/23 0509  WBC 9.3 13.6* 12.2* 10.2 9.6  HGB 8.7* 8.6* 8.6* 8.3* 8.8*  HCT 28.7* 28.4* 28.9* 26.4* 29.0*  MCV 90.5 90.4 91.7 88.9 89.5  PLT 246 288 249 262 291   Basic Metabolic Panel: Recent Labs  Lab 10/24/23 0229 10/27/23 1603 10/28/23 0246 10/29/23 0535 10/30/23 0509  NA 135 133* 137 138 136  K 4.8 4.6 4.4 4.4 4.2  CL 104 104 109 111 110   CO2 25 19* 21* 18* 19*  GLUCOSE 131* 148* 104* 96 121*  BUN 31* 48* 52* 50* 43*  CREATININE 1.66* 2.41* 2.65* 2.38* 2.48*  CALCIUM 9.0 8.8* 8.5* 8.7* 8.6*  PHOS  --   --   --  3.0  --    GFR: Estimated Creatinine Clearance: 16.4 mL/min (A) (by C-G formula based on SCr of 2.48 mg/dL (H)). Liver Function Tests: Recent Labs  Lab 10/29/23 0535  ALBUMIN  2.2*   No results for input(s): LIPASE, AMYLASE in the last 168 hours. No results for input(s): AMMONIA in the last 168 hours. Coagulation Profile: No results for input(s): INR, PROTIME in the last 168 hours. Cardiac Enzymes: Recent Labs  Lab 10/28/23 0246 10/29/23 0535 10/30/23 0509  CKTOTAL 1,699* 2,788* 2,145*   BNP (last 3 results) No results for input(s): PROBNP in the last 8760 hours. HbA1C: No results for input(s): HGBA1C in the last 72 hours. CBG: Recent Labs  Lab 10/29/23 0834 10/29/23 1145 10/29/23 1630 10/29/23 2128 10/30/23 0544  GLUCAP 100* 267* 70 212* 118*   Lipid Profile: No results for input(s): CHOL, HDL, LDLCALC, TRIG, CHOLHDL, LDLDIRECT in the last 72 hours. Thyroid Function Tests: No results for  input(s): TSH, T4TOTAL, FREET4, T3FREE, THYROIDAB in the last 72 hours. Anemia Panel: No results for input(s): VITAMINB12, FOLATE, FERRITIN, TIBC, IRON , RETICCTPCT in the last 72 hours. Sepsis Labs: Recent Labs  Lab 10/27/23 1615  LATICACIDVEN 1.3    Recent Results (from the past 240 hours)  Culture, blood (routine x 2)     Status: None (Preliminary result)   Collection Time: 10/27/23 10:19 PM   Specimen: BLOOD  Result Value Ref Range Status   Specimen Description BLOOD LEFT ARM  Final   Special Requests   Final    BOTTLES DRAWN AEROBIC AND ANAEROBIC Blood Culture adequate volume   Culture   Final    NO GROWTH 3 DAYS Performed at [facility], [address], [place], [place] [address]    Report Status PENDING  Incomplete  Culture,  blood (routine x 2)     Status: None (Preliminary result)   Collection Time: 10/27/23 10:20 PM   Specimen: BLOOD RIGHT ARM  Result Value Ref Range Status   Specimen Description BLOOD RIGHT ARM  Final   Special Requests   Final    BOTTLES DRAWN AEROBIC AND ANAEROBIC Blood Culture adequate volume   Culture   Final    NO GROWTH 3 DAYS Performed at [facility], [address], [place], [place] [address]    Report Status PENDING  Incomplete    Radiology Studies: CT HEAD WO CONTRAST ( ) Result Date: 10/29/2023 CLINICAL DATA:  Mental status change, unknown cause. Acute onset of generalized weakness. Fever and chills. EXAM: CT HEAD WITHOUT CONTRAST TECHNIQUE: Contiguous axial images were obtained from the base of the skull through the vertex without intravenous contrast. RADIATION DOSE REDUCTION: This exam was performed according to the departmental dose-optimization program which includes automated exposure control, adjustment of the mA and/or kV according to patient size and/or use of iterative reconstruction technique. COMPARISON:  Head CT and MRI 04/24/2023 FINDINGS: Brain: There is no evidence of an acute infarct, intracranial hemorrhage, mass, midline shift, or extra-axial fluid collection. There is mild cerebral atrophy. Cerebral white matter hypodensities are similar to the prior CT and are nonspecific but compatible with minimal chronic small vessel ischemic disease. Vascular: Calcified atherosclerosis at the skull base. No hyperdense vessel. Skull: No acute fracture or suspicious lesion. Sinuses/Orbits: Mild mucosal thickening in the included portions of the paranasal sinuses. Left mastoidectomy. Postoperative changes to the globes. Other: None. IMPRESSION: No evidence of acute intracranial abnormality. Electronically Signed   By: Dasie Hamburg M.D.   On: 10/29/2023 16:04   US  RENAL Result Date: 10/28/2023 CLINICAL DATA:  Acute kidney injury EXAM: RENAL / URINARY TRACT ULTRASOUND  COMPLETE COMPARISON:  Renal ultrasound examination dated 07/29/2023 FINDINGS: Right Kidney: Length = 10.4 cm Diffusely increased cortical echogenicity with preserved corticomedullary differentiation. Again seen is renal cortical thinning. No urinary tract dilation or shadowing calculi. The ureter is not seen. Left Kidney: Length = 10.4 cm Diffusely increased cortical echogenicity with preserved corticomedullary differentiation. Again seen is renal cortical thinning. Multiple cortical cysts measuring up to 1.0 cm in the interpolar kidney. No urinary tract dilation or shadowing calculi. The ureter is not seen. Bladder: Appears normal for degree of bladder distention. Other: None. IMPRESSION: 1. No urinary tract dilation or shadowing calculi. 2. Diffusely increased cortical echogenicity with preserved corticomedullary differentiation, consistent with medical renal disease. 3. Left renal cysts measuring up to 1.0 cm. Electronically Signed   By: Limin  Xu M.D.   On: 10/28/2023 18:52   Scheduled Meds:  apixaban   5 mg Oral BID   budesonide -glycopyrrolate -formoterol   2 puff Inhalation BID   cyanocobalamin   1,000 mcg Oral Daily   feeding supplement (GLUCERNA SHAKE)  237 mL Oral TID BM   ferrous sulfate   325 mg Oral Q breakfast   gabapentin   100 mg Oral QHS   insulin  aspart  0-5 Units Subcutaneous QHS   insulin  aspart  0-9 Units Subcutaneous TID WC   linezolid   600 mg Oral Q12H   polyethylene glycol  17 g Oral Daily   Continuous Infusions:  ertapenem  500 mg (10/29/23 2135)     LOS: 1 day    Time spent: 50 mins    Darcel Dawley, MD [facility]   If 7PM-7AM, please contact night-coverage

## 2023-10-31 DIAGNOSIS — N189 Chronic kidney disease, unspecified: Secondary | ICD-10-CM | POA: Diagnosis not present

## 2023-10-31 DIAGNOSIS — N179 Acute kidney failure, unspecified: Secondary | ICD-10-CM | POA: Diagnosis not present

## 2023-10-31 DIAGNOSIS — A498 Other bacterial infections of unspecified site: Secondary | ICD-10-CM | POA: Diagnosis not present

## 2023-10-31 LAB — BASIC METABOLIC PANEL WITH GFR
Anion gap: 7 (ref 5–15)
BUN: 41 mg/dL — ABNORMAL HIGH (ref 8–23)
CO2: 20 mmol/L — ABNORMAL LOW (ref 22–32)
Calcium: 8.6 mg/dL — ABNORMAL LOW (ref 8.9–10.3)
Chloride: 109 mmol/L (ref 98–111)
Creatinine, Ser: 2.78 mg/dL — ABNORMAL HIGH (ref 0.61–1.24)
GFR, Estimated: 22 mL/min — ABNORMAL LOW (ref >60)
Glucose, Bld: 114 mg/dL — ABNORMAL HIGH (ref 70–99)
Potassium: 4.5 mmol/L (ref 3.5–5.1)
Sodium: 136 mmol/L (ref 135–145)

## 2023-10-31 LAB — GLUCOSE, CAPILLARY
Glucose-Capillary: 100 mg/dL — ABNORMAL HIGH (ref 70–99)
Glucose-Capillary: 135 mg/dL — ABNORMAL HIGH (ref 70–99)
Glucose-Capillary: 101 mg/dL — ABNORMAL HIGH (ref 70–99)
Glucose-Capillary: 133 mg/dL — ABNORMAL HIGH (ref 70–99)

## 2023-10-31 LAB — CBC
HCT: 28.6 % — ABNORMAL LOW (ref 39.0–52.0)
Hemoglobin: 8.7 g/dL — ABNORMAL LOW (ref 13.0–17.0)
MCH: 27.4 pg (ref 26.0–34.0)
MCHC: 30.4 g/dL (ref 30.0–36.0)
MCV: 90.2 fL (ref 80.0–100.0)
Platelets: 292 K/uL (ref 150–400)
RBC: 3.17 MIL/uL — ABNORMAL LOW (ref 4.22–5.81)
RDW: 16.2 % — ABNORMAL HIGH (ref 11.5–15.5)
WBC: 8.7 K/uL (ref 4.0–10.5)
nRBC: 0 % (ref 0.0–0.2)

## 2023-10-31 LAB — PHOSPHORUS: Phosphorus: 4.1 mg/dL (ref 2.5–4.6)

## 2023-10-31 LAB — CK: Total CK: 1022 U/L — ABNORMAL HIGH (ref 49–397)

## 2023-10-31 LAB — MAGNESIUM: Magnesium: 2.1 mg/dL (ref 1.7–2.4)

## 2023-10-31 MED ORDER — CHLORHEXIDINE GLUCONATE CLOTH 2 % EX PADS
6.0000 | MEDICATED_PAD | Freq: Every day | CUTANEOUS | Status: DC
  Administered 2023-10-31 – 2023-11-04 (×5): 6 via TOPICAL

## 2023-10-31 MED ORDER — SODIUM CHLORIDE 0.9% FLUSH
10.0000 mL | INTRAVENOUS | Status: DC | PRN
  Administered 2023-11-04: 20 mL

## 2023-10-31 MED ORDER — SODIUM CHLORIDE 0.9% FLUSH
10.0000 mL | Freq: Two times a day (BID) | INTRAVENOUS | Status: DC
  Administered 2023-10-31 – 2023-11-04 (×8): 10 mL

## 2023-10-31 NOTE — Progress Notes (Signed)
 Orthopedic Tech Progress Note Patient Details:  ARIV PENROD [birth year] [id number]  Ortho Devices Type of Ortho Device: Knee Immobilizer Ortho Device/Splint Location: RLE Ortho Device/Splint Interventions: Ordered, Application, Adjustment   Post Interventions Patient Tolerated: Well Instructions Provided: Care of device  Adine MARLA Blush 10/31/2023, 2:58 PM

## 2023-10-31 NOTE — Progress Notes (Signed)
 Orthopaedic Trauma Progress Note  SUBJECTIVE: Doing ok this afternoon, resting comfortably in bed. Notes some intermittent pain in the leg but overall has been well-controlled. Patient denies chest pain, SOB, nausea/vomiting. No other complaints. No family at bedside currently  OBJECTIVE:  Vitals:   10/31/23 0746 10/31/23 0810  BP: (!) 153/88   Pulse: 91   Resp: 18   Temp: 98 F (36.7 C)   SpO2: 97% 98%    Opiates Today (MME): Today's  total administered Morphine  Milligram Equivalents: 0 Opiates Yesterday (MME): Yesterday's total administered Morphine  Milligram Equivalents: 20  General: Resting in bed. NAD Respiratory: No increased work of breathing.  Operative Extremity (RLE): Long leg splint and wound vac removed. Skin graft substitute appears stable. New dry dressing applied. There was no output in vac canister.  Nontender to the thigh.  Able to actively wiggle toes. Endorses sensation over the dorsal and plantar aspect off foot. + DP pulse  IMAGING: Repeat tibia imaging done 10/30/23 appears stable  LABS:  Results for orders placed or performed during the hospital encounter of 10/29/23 (from the past 24 hours)  Glucose, capillary     Status: Abnormal   Collection Time: 10/30/23  5:11 PM  Result Value Ref Range   Glucose-Capillary 107 (H) 70 - 99 mg/dL  CBC     Status: Abnormal   Collection Time: 10/31/23  5:00 AM  Result Value Ref Range   WBC 8.7 4.0 - 10.5 K/uL   RBC 3.17 (L) 4.22 - 5.81 MIL/uL   Hemoglobin 8.7 (L) 13.0 - 17.0 g/dL   HCT 71.3 (L) 60.9 - 47.9 %   MCV 90.2 80.0 - 100.0 fL   MCH 27.4 26.0 - 34.0 pg   MCHC 30.4 30.0 - 36.0 g/dL   RDW 83.7 (H) 88.4 - 84.4 %   Platelets 292 150 - 400 K/uL   nRBC 0.0 0.0 - 0.2 %  Magnesium      Status: None   Collection Time: 10/31/23  5:00 AM  Result Value Ref Range   Magnesium  2.1 1.7 - 2.4 mg/dL  Basic metabolic panel with GFR     Status: Abnormal   Collection Time: 10/31/23  5:00 AM  Result Value Ref Range   Sodium  136 135 - 145 mmol/L   Potassium 4.5 3.5 - 5.1 mmol/L   Chloride 109 98 - 111 mmol/L   CO2 20 (L) 22 - 32 mmol/L   Glucose, Bld 114 (H) 70 - 99 mg/dL   BUN 41 (H) 8 - 23 mg/dL   Creatinine, Ser 7.21 (H) 0.61 - 1.24 mg/dL   Calcium 8.6 (L) 8.9 - 10.3 mg/dL   GFR, Estimated 22 (L) >60 mL/min   Anion gap 7 5 - 15  Phosphorus     Status: None   Collection Time: 10/31/23  5:00 AM  Result Value Ref Range   Phosphorus 4.1 2.5 - 4.6 mg/dL  CK     Status: Abnormal   Collection Time: 10/31/23  5:00 AM  Result Value Ref Range   Total CK 1,022 (H) 49 - 397 U/L  Glucose, capillary     Status: Abnormal   Collection Time: 10/31/23  6:06 AM  Result Value Ref Range   Glucose-Capillary 100 (H) 70 - 99 mg/dL  Glucose, capillary     Status: Abnormal   Collection Time: 10/31/23 11:44 AM  Result Value Ref Range   Glucose-Capillary 135 (H) 70 - 99 mg/dL    ASSESSMENT: Joe Coleman is a 78 y.o.  male s/p ground level fall 08/24/2023 Procedures:  OPEN REDUCTION INTERNAL FIXATION  RIGHT TIBIA FRACTURE 08/25/23 REMOVAL RIGHT TIBIAL HARDWARE WITH IRRIGATION DEBRIDEMENT AND PLACEMENT OF SKIN GRAFT SUBSTITUTE AND WOUND VAC 10/15/2023 APPLICATION OF LONG-LEG SPLINT RIGHT LOWER EXTREMITY 10/15/2023  CV/Blood loss: Hemoglobin 8.7 this morning.  Stable  PLAN: Weightbearing: NWB RLE ROM: Maintain knee immobilizer Incisional and dressing care: Daily dressing changes to start 11/01/23 Showering: Avoid getting RLE wet when showering Orthopedic device(s): Knee immobilizer at all times RLE Pain management: Continue current regimen VTE treatment: Eliquis , SCDs ID: Ertapenem  and Linezolid  per ID Foley/Lines:  No foley, KVO IVFs Impediments to Fracture Healing: Infection.  Vit D level 28, continue on supplementation supplementation  Dispo: Daily dressing changes to R leg  starting 11/01/23. Continue PT/OT as able, currently recommending patient be discharged to SNF. Ok for d/c from ortho standpoint  Follow - up  plan: 7 to 10 days after discharge for wound check   Contact information:  Joe Light MD, Joe Moores PA-C. After hours and holidays please check [email or web address] for group call information for [facility]   Joe PATRIC Moores, PA-C [phone] (office) [email or web address]

## 2023-10-31 NOTE — Plan of Care (Signed)
  Problem: Education: Goal: Knowledge of General Education information will improve Description: Including pain rating scale, medication(s)/side effects and non-pharmacologic comfort measures Outcome: Progressing   Problem: Clinical Measurements: Goal: Ability to maintain clinical measurements within normal limits will improve Outcome: Progressing   Problem: Activity: Goal: Risk for activity intolerance will decrease Outcome: Progressing   Problem: Pain Managment: Goal: General experience of comfort will improve and/or be controlled Outcome: Progressing   Problem: Metabolic: Goal: Ability to maintain appropriate glucose levels will improve Outcome: Progressing

## 2023-10-31 NOTE — TOC Progression Note (Signed)
 Transition of Care [facility]) - Progression Note    Patient Details  Name: Joe Coleman MRN: [id number] Date of Birth: 07-21-1945  [facility]) CM/SW Contact  Bridget Cordella Simmonds, LCSW Phone Number: 10/31/2023, 11:40 AM  Clinical Narrative:   Pt agreeable to SNF, asked CSW to speak with niece Warren regarding facility choice: [name] would like SNF in [place], closer to pt MDs, pt is agreeable.  Permission given to send out referral in the hub.  Referral sent out for SNF.     Expected Discharge Plan: Skilled Nursing Facility Barriers to Discharge: Continued Medical Work up, SNF Pending bed offer               Expected Discharge Plan and Services   Discharge Planning Services: CM Consult Post Acute Care Choice: Skilled Nursing Facility Living arrangements for the past 2 months: Single Family Home                                       Social Drivers of Health (SDOH) Interventions SDOH Screenings   Food Insecurity: No Food Insecurity (10/29/2023)  Housing: Low Risk  (10/29/2023)  Transportation Needs: No Transportation Needs (10/29/2023)  Utilities: Not At Risk (10/29/2023)  Financial Resource Strain: Medium Risk (05/02/2021)   Received from [facility]  Physical Activity: Inactive (08/25/2018)   Received from [facility]  Social Connections: Unknown (10/29/2023)  Stress: No Stress Concern Present (08/25/2018)   [facility]  Tobacco Use: Medium Risk (10/27/2023)  Health Literacy: High Risk (05/04/2022)   Received from [facility]    Readmission Risk Interventions    09/01/2023   12:00 PM  Readmission Risk Prevention Plan  Transportation Screening Complete  Medication Review (RN Care Manager) Complete  PCP or Specialist appointment within 3-5 days of discharge Complete  HRI or Home Care Consult Complete  SW Recovery Care/Counseling Consult Complete  Palliative Care Screening Not Applicable  Skilled Nursing Facility  Complete

## 2023-10-31 NOTE — Progress Notes (Signed)
 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 To discharging provider:  please sign these orders via discharge navigator,  Select New Orders & click on the button choice - Manage This Unsigned Work.    Thank you for allowing pharmacy to be a part of this patient's care.  Feliciano Close, PharmD PGY2 Infectious Diseases Pharmacy Resident  10/31/2023 12:48 PM

## 2023-10-31 NOTE — NC FL2 (Signed)
 [place]  MEDICAID FL2 LEVEL OF CARE FORM     IDENTIFICATION  Patient Name: Joe Coleman Birthdate: 11-May-1945 Sex: male Admission Date (Current Location): 10/29/2023  [place] and [place] Number:  Lloyd [id number] P Facility and Address:  [facility], [address], [place], [place] [address]      Provider Number: [id number]  Attending Physician Name and Address:  Leotis Bogus, MD  Relative Name and Phone Number:  Jakari, Sada   [phone]    Current Level of Care: Hospital Recommended Level of Care: Skilled Nursing Facility Prior Approval Number:    Date Approved/Denied:   PASRR Number: [id number]  Discharge Plan: SNF    Current Diagnoses: Patient Active Problem List   Diagnosis Date Noted   Hardware complicating wound infection 10/29/2023   Uncontrolled type 2 diabetes mellitus with hyperglycemia, without long-term current use of insulin  (HCC) 10/29/2023   Type 2 diabetes mellitus with peripheral neuropathy (HCC) 10/28/2023   GERD without esophagitis 10/28/2023   Chronic obstructive pulmonary disease (COPD) (HCC) 10/28/2023   Cellulitis of right leg 10/28/2023   Rhabdomyolysis 10/28/2023   Pressure injury of skin 10/28/2023   History of DVT (deep vein thrombosis) 10/28/2023   Normocytic anemia 10/28/2023   Acute metabolic encephalopathy 10/27/2023   Acute kidney injury superimposed on chronic kidney disease 10/27/2023   Open knee wound, right, sequela 10/14/2023   Malnutrition of moderate degree 08/30/2023   Tibia/fibula fracture 08/24/2023   Cellulitis of right arm 08/10/2023   Eye pain, bilateral 07/26/2022   Fall 07/26/2022   Lymphopenia 07/26/2022   Shortness of breath 05/21/2022   COVID-19 virus infection 03/17/2020   COPD with acute exacerbation (HCC) 03/17/2020   Type 2 diabetes mellitus (HCC) 03/17/2020   HTN (hypertension) 03/17/2020   Vitamin B12 deficiency 10/05/2019   Erectile dysfunction 11/10/2017    Chronic kidney disease (CKD), stage III (moderate) (HCC) 09/19/2017   Mild intermittent asthma without complication 09/19/2017   Chronic cough 10/22/2016   Epigastric pain 06/30/2015   Chronic bilateral low back pain without sciatica 05/31/2015   Neuropathic pain 03/01/2015   Benign prostatic hyperplasia with lower urinary tract symptoms 04/06/2014   Adenomatous colon polyp 08/31/2013   Depression 06/21/2013   Foot pain, bilateral 05/07/2013   Insomnia 03/24/2013   Vitiligo 12/23/2012   Primary localized osteoarthrosis, lower leg 01/15/2011   Pain medication agreement broken 09/19/2010   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    Orientation RESPIRATION BLADDER Height & Weight     Self, Time, Situation, Place  Normal Continent Weight:   Height:     BEHAVIORAL SYMPTOMS/MOOD NEUROLOGICAL BOWEL NUTRITION STATUS      Continent    AMBULATORY STATUS COMMUNICATION OF NEEDS Skin   Limited Assist Verbally Surgical wounds, Other (Comment) (redness, ecchymosis)                       Personal Care Assistance Level of Assistance  Bathing, Feeding, Dressing Bathing Assistance: Limited assistance Feeding assistance: Limited assistance Dressing Assistance: Limited assistance     Functional Limitations Info  Sight, Hearing, Speech Sight Info: Adequate Hearing Info: Adequate Speech Info: Adequate    SPECIAL CARE FACTORS FREQUENCY  PT (By licensed PT), OT (By licensed OT)     PT Frequency: 5x week OT Frequency: 5x week            Contractures Contractures Info: Not present  Additional Factors Info  Code Status, Allergies, Insulin  Sliding Scale Code Status Info: full Allergies Info: Losartan, Chicken Allergy, Poultry Meal   Insulin  Sliding Scale Info: Novolog : see discharge summary       Current Medications (10/31/2023):  This is the current hospital  active medication list Current Facility-Administered Medications  Medication Dose Route Frequency Provider Last Rate Last Admin   acetaminophen  (TYLENOL ) tablet 650 mg  650 mg Oral Q6H PRN Patel, Vishal R, MD   650 mg at 10/31/23 0518   Or   acetaminophen  (TYLENOL ) suppository 650 mg  650 mg Rectal Q6H PRN Patel, Vishal R, MD       albuterol  (PROVENTIL ) (2.5 MG/3ML) 0.083% nebulizer solution 2.5 mg  2.5 mg Inhalation Q6H PRN Patel, Vishal R, MD       apixaban  (ELIQUIS ) tablet 5 mg  5 mg Oral BID Patel, Vishal R, MD   5 mg at 10/30/23 2122   bisacodyl  (DULCOLAX) EC tablet 5 mg  5 mg Oral Daily PRN Patel, Vishal R, MD       budesonide -glycopyrrolate -formoterol  (BREZTRI ) 160-9-4.8 MCG/ACT inhaler 2 puff  2 puff Inhalation BID Tobie Jorie SAUNDERS, MD   2 puff at 10/31/23 0810   Chlorhexidine  Gluconate Cloth 2 % PADS 6 each  6 each Topical Daily Leotis Bogus, MD       cyanocobalamin  (VITAMIN B12) tablet 1,000 mcg  1,000 mcg Oral Daily Patel, Vishal R, MD   1,000 mcg at 10/31/23 1106   ertapenem  (INVANZ ) 500 mg in sodium chloride  0.9 % 50 mL IVPB  500 mg Intravenous Q24H Patel, Vishal R, MD   Stopped at 10/30/23 2153   feeding supplement (GLUCERNA SHAKE) (GLUCERNA SHAKE) liquid 237 mL  237 mL Oral TID BM Patel, Vishal R, MD   237 mL at 10/30/23 1311   ferrous sulfate  tablet 325 mg  325 mg Oral Q breakfast Patel, Vishal R, MD   325 mg at 10/31/23 1106   gabapentin  (NEURONTIN ) capsule 100 mg  100 mg Oral QHS Patel, Vishal R, MD   100 mg at 10/30/23 2122   HYDROcodone -acetaminophen  (NORCO/VICODIN) 5-325 MG per tablet 1-2 tablet  1-2 tablet Oral Q4H PRN Patel, Vishal R, MD   2 tablet at 10/30/23 2122   insulin  aspart (novoLOG ) injection 0-5 Units  0-5 Units Subcutaneous QHS Patel, Vishal R, MD   2 Units at 10/29/23 2130   insulin  aspart (novoLOG ) injection 0-9 Units  0-9 Units Subcutaneous TID WC Patel, Vishal R, MD   2 Units at 10/30/23 1310   linezolid  (ZYVOX ) tablet 600 mg  600 mg Oral Q12H Vu, Trung T,  MD   600 mg at 10/31/23 1104   methocarbamol  (ROBAXIN ) tablet 500 mg  500 mg Oral Q6H PRN Patel, Vishal R, MD       ondansetron  (ZOFRAN ) tablet 4 mg  4 mg Oral Q6H PRN Patel, Vishal R, MD       Or   ondansetron  (ZOFRAN ) injection 4 mg  4 mg Intravenous Q6H PRN Patel, Vishal R, MD       polyethylene glycol (MIRALAX  / GLYCOLAX ) packet 17 g  17 g Oral Daily Patel, Vishal R, MD   17 g at 10/30/23 0850   senna-docusate (Senokot-S) tablet 1 tablet  1 tablet Oral QHS PRN Patel, Vishal R, MD       sodium chloride  flush (NS) 0.9 % injection 10-40 mL  10-40 mL Intracatheter Q12H Khatri, Pardeep, MD       sodium chloride  flush (NS) 0.9 %  injection 10-40 mL  10-40 mL Intracatheter PRN Leotis Bogus, MD         Discharge Medications: Please see discharge summary for a list of discharge medications.  Relevant Imaging Results:  Relevant Lab Results:   Additional Information [id number]  Bridget Cordella Simmonds, LCSW

## 2023-10-31 NOTE — Evaluation (Signed)
 Occupational Therapy Evaluation Patient Details Name: Joe Coleman MRN: [id number] DOB: May 13, 1945 Today's Date: 10/31/2023   History of Present Illness   Pt is 78 y.o. male transfer from [facility] 10/29/23 with AMS, AKI, and rhabdomyolysis. SABRA PMHx: T2DM, COPD, HTN, past tobacco abuse, and hx R tib fx s/p ORIF (7/25), wound infection (8/28), wound dehiscence with surgical hardware visible (9/9), s/p removal of hardware, I&D, skin graft, and wound vac placement (9/10), wound vac change (9/23). Of note, recent hospitalization 9/9-9/19 for right leg wound with infected hardware d/t Enterobacter & Klebsiella, placed on IV antibiotics as well as acute LLE distal DVT started on Eliquis .     Clinical Impressions Pt contacted for OT evaluation 10/31/23. Prior to admission, Pt reports requiring assistance from his family, who is intermittently available, for all mobility and with ADL/IADL tasks. Pt currently requires Min A +2 for functional transfers and at least Min A. For ADL tasks. Pt is limited by decreased safety awareness, unsteadiness on feet, and pain limiting occupational engagement. OT to continue skilled therapy services in acute care to maximize functional abilities and decrease burden of care. Patient will benefit from continued inpatient follow up therapy, <3 hours/day.      If plan is discharge home, recommend the following:   Assistance with cooking/housework;Assist for transportation;Help with stairs or ramp for entrance;A little help with walking and/or transfers;A little help with bathing/dressing/bathroom;Supervision due to cognitive status     Functional Status Assessment   Patient has had a recent decline in their functional status and demonstrates the ability to make significant improvements in function in a reasonable and predictable amount of time.     Equipment Recommendations   None recommended by OT     Recommendations for Other Services          Precautions/Restrictions   Precautions Precautions: Fall Recall of Precautions/Restrictions: Impaired Precaution/Restrictions Comments: Wound Vac RLE; Reviewed weight-bearing precautions Required Braces or Orthoses: Splint/Cast Splint/Cast: RLE long leg splint Splint/Cast - Date Prophylactic Dressing Applied (if applicable): 10/15/23 Restrictions Weight Bearing Restrictions Per Provider Order: Yes RLE Weight Bearing Per Provider Order: Non weight bearing     Mobility Bed Mobility Overal bed mobility: Needs Assistance Bed Mobility: Supine to Sit, Sit to Supine     Supine to sit: Contact guard, +2 for safety/equipment, HOB elevated Sit to supine: Contact guard assist, +2 for safety/equipment   General bed mobility comments: Pt sat up on R side of bed. CGA for safety. Assist to manage lines/leads. Returning to bed pt eased trunk down and swung BLE back into bed.    Transfers Overall transfer level: Needs assistance Equipment used: Rolling walker (2 wheels), None Transfers: Sit to/from Stand, Bed to chair/wheelchair/BSC Sit to Stand: Contact guard assist, +2 physical assistance, +2 safety/equipment Stand pivot transfers: Contact guard assist, +2 physical assistance, +2 safety/equipment        Lateral/Scoot Transfers: Contact guard assist, +2 safety/equipment General transfer comment: Pt stood from lowest bed height. Cued proper hand placement using RW. Instructed pt to scoot fwd to edge of bed and rely on BUE support and LLE. Powered up with CGA x2. Transferred to recliner chair on left positioned touching the bed with CGA x2. Pt with notable unsteadiness on feet when using RW, leaning to L side. Cues for sequencing. Poor eccentric control, pt plopping down in chair before BLE were touching front of surface and not reaching back to armrest. Pt opted to transfer back to bed going to the right by scooting. He  utilized BUE support on chair/armrest/bed and was able to lift, shift, and  lower himself to move along. Pt maintained RLE NWB at all times. CGA x2 for safety/stability and assist to manage lines/leads.      Balance Overall balance assessment: Needs assistance Sitting-balance support: No upper extremity supported, Feet supported Sitting balance-Leahy Scale: Fair Sitting balance - Comments: Pt sat EOB statically with close supervision and dynamically with CGA.   Standing balance support: Bilateral upper extremity supported, During functional activity, Reliant on assistive device for balance Standing balance-Leahy Scale: Poor Standing balance comment: Pt dependent on RW                           ADL either performed or assessed with clinical judgement   ADL Overall ADL's : Needs assistance/impaired Eating/Feeding: Independent;Sitting   Grooming: Brushing hair;Moderate assistance;Sitting   Upper Body Bathing: Minimal assistance;Sitting   Lower Body Bathing: Maximal assistance   Upper Body Dressing : Minimal assistance;Sitting   Lower Body Dressing: Moderate assistance;Sitting/lateral leans               Functional mobility during ADLs: Minimal assistance;+2 for physical assistance;Rolling walker (2 wheels)       Vision Baseline Vision/History: 1 Wears glasses Patient Visual Report: No change from baseline       Perception         Praxis         Pertinent Vitals/Pain Pain Assessment Pain Assessment: Faces Faces Pain Scale: Hurts little more Pain Location: Bottom Pain Descriptors / Indicators: Aching, Discomfort Pain Intervention(s): Repositioned, Monitored during session     Extremity/Trunk Assessment Upper Extremity Assessment Upper Extremity Assessment: Overall WFL for tasks assessed   Lower Extremity Assessment Lower Extremity Assessment: Defer to PT evaluation RLE Deficits / Details: Pt is long leg splint spanning knee and ankle joints. He demonstrated limited hip AROM. Pt was able to complete a straight leg raise  and wiggle his toes. Grossly 3/5 hip strength. RLE: Unable to fully assess due to pain;Unable to fully assess due to immobilization RLE Sensation: history of peripheral neuropathy RLE Coordination: decreased gross motor   Cervical / Trunk Assessment Cervical / Trunk Assessment: Normal   Communication Communication Communication: No apparent difficulties   Cognition Arousal: Alert Behavior During Therapy: Impulsive Cognition: No family/caregiver present to determine baseline, Cognition impaired   Orientation impairments: Situation Awareness: Intellectual awareness intact, Online awareness impaired     Executive functioning impairment (select all impairments): Organization, Problem solving, Reasoning, Sequencing                   Following commands: Impaired Following commands impaired: Follows one step commands with increased time     Cueing  General Comments   Cueing Techniques: Verbal cues;Gestural cues  RN entering mid-session to change pt's PICC line tubing and requesting pt return to bed instead of remain sitting in recliner chair. Discussed d/c disposition. Pt agreeable to rehab, requests not [facility].   Exercises     Shoulder Instructions      Home Living Family/patient expects to be discharged to:: Private residence Living Arrangements: Alone Available Help at Discharge: Family;Available PRN/intermittently (Neice assists as needed) Type of Home: Apartment Home Access: Stairs to enter Entrance Stairs-Number of Steps: 1 Entrance Stairs-Rails: None Home Layout: One level     Bathroom Shower/Tub: Tub/shower unit;Walk-in shower   Bathroom Toilet: Standard     Home Equipment: Rollator (4 wheels);Rolling Walker (2 wheels);Wheelchair - manual  Additional Comments: Above info obtained from chart review. Pt reports he d/c'd to Niece's home last time and was staying on a couch primarily.      Prior Functioning/Environment Prior Level of Function : Patient  poor historian/Family not available;Needs assist       Physical Assist : Mobility (physical);ADLs (physical)   ADLs (physical): Bathing;Dressing;Toileting;IADLs Mobility Comments: Pt reports being bedridden staying on couch the majority of the day at his niece's place. Per chart review, pt has w/c he could transfer into using scooting method and is able to propel himself. ADLs Comments: Pt reports using urinal and bedpan to come toileting. He is able to groom himself with set-up assist. Pt request assist from family/staff to complete all ADLs. Per chart review, he relies on family for IADLs.    OT Problem List: Decreased activity tolerance;Impaired balance (sitting and/or standing);Decreased coordination;Decreased safety awareness;Decreased knowledge of use of DME or AE;Decreased cognition   OT Treatment/Interventions: Self-care/ADL training;DME and/or AE instruction;Therapeutic activities;Patient/family education;Balance training      OT Goals(Current goals can be found in the care plan section)   Acute Rehab OT Goals Patient Stated Goal: Pt agreeable to rehab prior to going home OT Goal Formulation: With patient Time For Goal Achievement: 11/14/23 Potential to Achieve Goals: Good ADL Goals Pt Will Perform Grooming: with set-up;sitting Pt Will Perform Lower Body Bathing: with modified independence;with adaptive equipment;sitting/lateral leans Pt Will Perform Lower Body Dressing: with modified independence;sitting/lateral leans;with adaptive equipment Pt Will Transfer to Toilet: with modified independence;squat pivot transfer;bedside commode   OT Frequency:  Min 2X/week    Co-evaluation PT/OT/SLP Co-Evaluation/Treatment: Yes Reason for Co-Treatment: Necessary to address cognition/behavior during functional activity;For patient/therapist safety;To address functional/ADL transfers PT goals addressed during session: Mobility/safety with mobility;Balance;Proper use of DME OT goals  addressed during session: ADL's and self-care;Proper use of Adaptive equipment and DME      AM-PAC OT 6 Clicks Daily Activity     Outcome Measure Help from another person eating meals?: None Help from another person taking care of personal grooming?: A Lot Help from another person toileting, which includes using toliet, bedpan, or urinal?: A Lot Help from another person bathing (including washing, rinsing, drying)?: A Little Help from another person to put on and taking off regular upper body clothing?: None Help from another person to put on and taking off regular lower body clothing?: A Little 6 Click Score: 18   End of Session Equipment Utilized During Treatment: Gait belt;Rolling walker (2 wheels) Nurse Communication: Mobility status  Activity Tolerance: Patient tolerated treatment well Patient left: in bed;with call bell/phone within reach;with nursing/sitter in room  OT Visit Diagnosis: Unsteadiness on feet (R26.81);Muscle weakness (generalized) (M62.81)                Time: 9061-9044 OT Time Calculation (min): 17 min Charges:  OT General Charges $OT Visit: 1 Visit OT Evaluation $OT Eval Moderate Complexity: 1 Mod  Maurilio CROME, OTR/L.  [facility]  Office: [phone]  Maurilio PARAS Naziah Weckerly 10/31/2023, 12:04 PM

## 2023-10-31 NOTE — Care Management Important Message (Signed)
 Important Message  Patient Details  Name: Joe Coleman MRN: [id number] Date of Birth: Mar 01, 1945   Important Message Given:  Yes - Medicare IM     Jon Cruel 10/31/2023, 12:53 PM

## 2023-10-31 NOTE — Plan of Care (Signed)
   Problem: Education: Goal: Knowledge of General Education information will improve Description Including pain rating scale, medication(s)/side effects and non-pharmacologic comfort measures Outcome: Progressing   Problem: Health Behavior/Discharge Planning: Goal: Ability to manage health-related needs will improve Outcome: Progressing

## 2023-10-31 NOTE — Evaluation (Signed)
 Physical Therapy Evaluation Patient Details Name: Joe Coleman MRN: [id number] DOB: Aug 20, 1945 Today's Date: 10/31/2023  History of Present Illness  Pt is 78 y.o. male transfer from [facility] 10/29/23 with AMS, AKI, and rhabdomyolysis. PMHx: T2DM, COPD, HTN, past tobacco abuse, and hx R tib fx s/p ORIF (7/25), wound infection (8/28), wound dehiscence with surgical hardware visible (9/9), s/p removal of hardware, I&D, skin graft, and wound vac placement (9/10), wound vac change (9/23). Of note, recent hospitalization 9/9-9/19 for right leg wound with infected hardware d/t Enterobacter & Klebsiella, placed on IV antibiotics as well as acute LLE distal DVT started on Eliquis .   Clinical Impression  Pt admitted with above diagnosis. Examination limited by pt's impaired cognition, pt being a poor historian, and lack of family present. PTA, pt was residing at his niece's house and required assistance with functional mobility, ADLs, and IADLs. Pt currently with functional limitations due to the deficits listed below (see PT Problem List). He required CGA x2 for bed mobility and transfers. Pt maintained RLE NWB at all times. He demonstrated decreased safety awareness and unsteadiness. Pt will benefit from acute skilled PT to increase his independence and safety with mobility to allow discharge. Recommend continued inpatient follow up therapy, <3 hours/day.    If plan is discharge home, recommend the following: A lot of help with walking and/or transfers;A lot of help with bathing/dressing/bathroom;Assistance with cooking/housework;Direct supervision/assist for medications management;Assist for transportation;Help with stairs or ramp for entrance;Supervision due to cognitive status   Can travel by private vehicle   No    Equipment Recommendations BSC/3in1  Recommendations for Other Services       Functional Status Assessment Patient has had a recent decline in their functional status and demonstrates the  ability to make significant improvements in function in a reasonable and predictable amount of time.     Precautions / Restrictions Precautions Precautions: Fall Recall of Precautions/Restrictions: Impaired Precaution/Restrictions Comments: Wound Vac RLE; Reviewed weight-bearing precautions Required Braces or Orthoses: Splint/Cast Splint/Cast: RLE long leg splint Splint/Cast - Date Prophylactic Dressing Applied (if applicable): 10/15/23 Restrictions Weight Bearing Restrictions Per Provider Order: Yes RLE Weight Bearing Per Provider Order: Non weight bearing      Mobility  Bed Mobility Overal bed mobility: Needs Assistance Bed Mobility: Supine to Sit, Sit to Supine     Supine to sit: Contact guard, +2 for safety/equipment, HOB elevated Sit to supine: Contact guard assist, +2 for safety/equipment   General bed mobility comments: Pt sat up on R side of bed. CGA for safety. Assist to manage lines/leads. Returning to bed pt eased trunk down and swung BLE back into bed.    Transfers Overall transfer level: Needs assistance Equipment used: Rolling walker (2 wheels), None Transfers: Sit to/from Stand, Bed to chair/wheelchair/BSC Sit to Stand: Contact guard assist, +2 physical assistance, +2 safety/equipment Stand pivot transfers: Contact guard assist, +2 physical assistance, +2 safety/equipment        Lateral/Scoot Transfers: Contact guard assist, +2 safety/equipment General transfer comment: Pt stood from lowest bed height. Cued proper hand placement using RW. Instructed pt to scoot fwd to edge of bed and rely on BUE support and LLE. Powered up with CGA x2. Transferred to recliner chair on left positioned touching the bed with CGA x2. Cues for sequencing. Poor eccentric control, pt plopping down in chair before BLE were touching front of surface and not reaching back to armrest. Pt opted to transfer back to bed going to the right by scooting. He utilized BUE  support on  chair/armrest/bed and was able to lift, shift, and lower himself to move along. Pt maintained RLE NWB at all times. CGA x2 for safety/stability and assist to manage lines/leads.    Ambulation/Gait Ambulation/Gait assistance: Contact guard assist, +2 physical assistance, +2 safety/equipment Gait Distance (Feet): 2 Feet Assistive device: Rolling walker (2 wheels) Gait Pattern/deviations: Step-to pattern Gait velocity: decr     General Gait Details: Pt took a couple of small hops vs. pivoting on L foot to transfer to recliner chair. Heavy reliance on BUE support on handrail. Cues for sequencing.  Stairs            Wheelchair Mobility     Tilt Bed    Modified Rankin (Stroke Patients Only)       Balance Overall balance assessment: Needs assistance Sitting-balance support: No upper extremity supported, Feet supported Sitting balance-Leahy Scale: Fair Sitting balance - Comments: Pt sat EOB statically with close supervision and dynamically with CGA.   Standing balance support: Bilateral upper extremity supported, During functional activity, Reliant on assistive device for balance Standing balance-Leahy Scale: Poor Standing balance comment: Pt dependent on RW                             Pertinent Vitals/Pain Pain Assessment Pain Assessment: Faces Faces Pain Scale: Hurts little more Pain Location: Bottom Pain Descriptors / Indicators: Aching, Discomfort Pain Intervention(s): Monitored during session, Limited activity within patient's tolerance, Repositioned    Home Living Family/patient expects to be discharged to:: Private residence Living Arrangements: Alone Available Help at Discharge: Family;Available PRN/intermittently (Neice assists as needed) Type of Home: Apartment Home Access: Stairs to enter Entrance Stairs-Rails: None Entrance Stairs-Number of Steps: 1   Home Layout: One level Home Equipment: Rollator (4 wheels);Rolling Walker (2  wheels);Wheelchair - manual Additional Comments: Above info obtained from chart review. Pt reports he d/c'd to Niece's home last time and was staying on a couch primarily.    Prior Function Prior Level of Function : Patient poor historian/Family not available;Needs assist       Physical Assist : Mobility (physical);ADLs (physical) Mobility (physical): Bed mobility;Transfers;Gait ADLs (physical): Bathing;Dressing;Toileting;IADLs Mobility Comments: Pt reports being bedridden staying on couch the majority of the day at his niece's place. Per chart review, pt has w/c he could transfer into using scooting method and is able to propel himself. ADLs Comments: Pt reports using urinal and bedpan to come toileting. He is able to groom himself with set-up assist. Pt request assist from family/staff to complete all ADLs. Per chart review, he relies on family for IADLs.     Extremity/Trunk Assessment   Upper Extremity Assessment Upper Extremity Assessment: Overall WFL for tasks assessed    Lower Extremity Assessment Lower Extremity Assessment: Defer to PT evaluation RLE Deficits / Details: Pt is long leg splint spanning knee and ankle joints. He demonstrated limited hip AROM. Pt was able to complete a straight leg raise and wiggle his toes. Grossly 3/5 hip strength. RLE: Unable to fully assess due to pain;Unable to fully assess due to immobilization RLE Sensation: history of peripheral neuropathy RLE Coordination: decreased gross motor    Cervical / Trunk Assessment Cervical / Trunk Assessment: Normal  Communication   Communication Communication: No apparent difficulties    Cognition Arousal: Alert Behavior During Therapy: Impulsive   PT - Cognitive impairments: No family/caregiver present to determine baseline, Safety/Judgement, Orientation   Orientation impairments: Time, Situation  PT - Cognition Comments: Pt A,Ox2. He was eager to mobilize and demonstrated  poor safety awareness. Re-direction to focus on task. Following commands: Impaired Following commands impaired: Follows one step commands with increased time     Cueing Cueing Techniques: Verbal cues, Gestural cues     General Comments General comments (skin integrity, edema, etc.): RN entering mid-session to change pt's PICC line tubing and requesting pt return to bed instead of remain sitting in recliner chair. Discussed d/c disposition. Pt agreeable to rehab, requests not [facility].    Exercises     Assessment/Plan    PT Assessment Patient needs continued PT services  PT Problem List Decreased strength;Decreased range of motion;Decreased activity tolerance;Decreased balance;Decreased mobility;Decreased coordination;Decreased cognition;Decreased knowledge of use of DME;Decreased safety awareness;Decreased knowledge of precautions;Pain       PT Treatment Interventions DME instruction;Gait training;Functional mobility training;Therapeutic activities;Therapeutic exercise;Balance training;Neuromuscular re-education;Cognitive remediation;Patient/family education;Wheelchair mobility training    PT Goals (Current goals can be found in the Care Plan section)  Acute Rehab PT Goals Patient Stated Goal: Return Home and regain independence PT Goal Formulation: With patient Time For Goal Achievement: 11/14/23 Potential to Achieve Goals: Fair    Frequency Min 2X/week     Co-evaluation PT/OT/SLP Co-Evaluation/Treatment: Yes Reason for Co-Treatment: Necessary to address cognition/behavior during functional activity;For patient/therapist safety;To address functional/ADL transfers PT goals addressed during session: Mobility/safety with mobility;Balance;Proper use of DME OT goals addressed during session: ADL's and self-care;Proper use of Adaptive equipment and DME       AM-PAC PT 6 Clicks Mobility  Outcome Measure Help needed turning from your back to your side while in a flat bed without  using bedrails?: A Little Help needed moving from lying on your back to sitting on the side of a flat bed without using bedrails?: A Little Help needed moving to and from a bed to a chair (including a wheelchair)?: Total Help needed standing up from a chair using your arms (e.g., wheelchair or bedside chair)?: Total Help needed to walk in hospital room?: Total Help needed climbing 3-5 steps with a railing? : Total 6 Click Score: 10    End of Session Equipment Utilized During Treatment: Gait belt Activity Tolerance: Patient tolerated treatment well Patient left: in bed;with call bell/phone within reach;with bed alarm set;with nursing/sitter in room Nurse Communication: Mobility status PT Visit Diagnosis: Unsteadiness on feet (R26.81);Difficulty in walking, not elsewhere classified (R26.2);Pain;Other abnormalities of gait and mobility (R26.89) Pain - Right/Left: Right Pain - part of body: Leg    Time: 9061-9044 PT Time Calculation (min) (ACUTE ONLY): 17 min   Charges:   PT Evaluation $PT Eval Moderate Complexity: 1 Mod   PT General Charges $$ ACUTE PT VISIT: 1 Visit         Randall SAUNDERS, PT, DPT Acute Rehabilitation Services Office: [phone] Secure Chat Preferred  Delon CHRISTELLA Callander 10/31/2023, 12:18 PM

## 2023-10-31 NOTE — Progress Notes (Signed)
 [facility]  Date of Admission:  10/29/2023     Lines: picc  Abx: Linezolid /ertapenem   ASSESSMENT: 78 yo male with kleb pna and enterobactger wound infection in setting hardware which was removed on 10/15/23  He came back confused and elevated cpk so abx switched to ertapenem  linezolid   Gpc coverage continued on 10/15/23 as he was on doxycycline  previously   Doing well back to baseline today  PLAN: Continue abx until 10/21 with ertapenem  iv, and linezolid  po for 2 weeks then the rest of the course gram positive coverage with doxy See opat below Maintain standard isolation precaution Id will sign off Discussed with primary team   OPAT Orders Discharge antibiotics to be given via PICC line Discharge antibiotics: Iv ertapenem  until 11/25/23 Po linezolid  600 mg bid 2 weeks until 10/08, then doxycycline  100 mg po bid to finish the course  Duration: See end date below End Date: 11/25/23  [facility] Care Per Protocol:  Home health RN for IV administration and teaching; PICC line care and labs.    Labs weekly while on IV antibiotics: _x_ CBC with differential __ BMP _x_ CMP _x_ CRP _x_ ESR __ Vancomycin  trough __ CK  _x_ Please pull PIC at completion of IV antibiotics __ Please leave PIC in place until doctor has seen patient or been notified  Fax weekly labs to [phone]  Clinic Follow Up Appt: 9/30 @ 345  @  [facility] [address], [place], [place] [address] Phone: [phone]   Principal Problem:   Acute kidney injury superimposed on chronic kidney disease Active Problems:   Acute metabolic encephalopathy   Type 2 diabetes mellitus with peripheral neuropathy (HCC)   Chronic obstructive pulmonary disease (COPD) (HCC)   Rhabdomyolysis   History of DVT (deep vein thrombosis)   Normocytic anemia   Hardware complicating wound infection   Allergies  Allergen Reactions   Losartan Other (See Comments)     Hyperkalemia (>5.5 multiple times on losartan 25mg )   Chicken Allergy Nausea And Vomiting        Poultry Meal Nausea And Vomiting    Scheduled Meds:  apixaban   5 mg Oral BID   budesonide -glycopyrrolate -formoterol   2 puff Inhalation BID   Chlorhexidine  Gluconate Cloth  6 each Topical Daily   cyanocobalamin   1,000 mcg Oral Daily   feeding supplement (GLUCERNA SHAKE)  237 mL Oral TID BM   ferrous sulfate   325 mg Oral Q breakfast   gabapentin   100 mg Oral QHS   insulin  aspart  0-5 Units Subcutaneous QHS   insulin  aspart  0-9 Units Subcutaneous TID WC   linezolid   600 mg Oral Q12H   polyethylene glycol  17 g Oral Daily   sodium chloride  flush  10-40 mL Intracatheter Q12H   Continuous Infusions:  ertapenem  Stopped (10/30/23 2153)   PRN Meds:.acetaminophen  **OR** acetaminophen , albuterol , bisacodyl , HYDROcodone -acetaminophen , methocarbamol , ondansetron  **OR** ondansetron  (ZOFRAN ) IV, senna-docusate, sodium chloride  flush   SUBJECTIVE: Feels well No complaint No fever, chill No confusion  Review of Systems: ROS All other ROS was negative, except mentioned above     OBJECTIVE: Vitals:   10/30/23 0431 10/30/23 2022 10/31/23 0746 10/31/23 0810  BP: (!) 160/81  (!) 153/88   Pulse: 86  91   Resp: 16  18   Temp: 98.3 F (36.8 C)  98 F (36.7 C)   TempSrc: Oral     SpO2: 97% 96% 97% 98%   There  is no height or weight on file to calculate BMI.  Physical Exam No distress Pleasant, cooperative Normocephalic; s[place] beard Cv rrr no mrg Normal resp effort Abd s/nt Neuro nonfocal Msk wound vac on rle functioning; wiggle toes and sensation on toes intact     Lab Results Lab Results  Component Value Date   WBC 8.7 10/31/2023   HGB 8.7 (L) 10/31/2023   HCT 28.6 (L) 10/31/2023   MCV 90.2 10/31/2023   PLT 292 10/31/2023    Lab Results  Component Value Date   CREATININE 2.78 (H) 10/31/2023   BUN 41 (H) 10/31/2023   NA 136 10/31/2023   K 4.5 10/31/2023   CL  109 10/31/2023   CO2 20 (L) 10/31/2023    Lab Results  Component Value Date   ALT 6 10/16/2023   AST 14 (L) 10/16/2023   ALKPHOS 67 10/16/2023   BILITOT 0.6 10/16/2023      Microbiology: Recent Results (from the past 240 hours)  Culture, blood (routine x 2)     Status: None (Preliminary result)   Collection Time: 10/27/23 10:19 PM   Specimen: BLOOD  Result Value Ref Range Status   Specimen Description BLOOD LEFT ARM  Final   Special Requests   Final    BOTTLES DRAWN AEROBIC AND ANAEROBIC Blood Culture adequate volume   Culture   Final    NO GROWTH 4 DAYS Performed at [facility], [address] [place], [place] [address]    Report Status PENDING  Incomplete  Culture, blood (routine x 2)     Status: None (Preliminary result)   Collection Time: 10/27/23 10:20 PM   Specimen: BLOOD RIGHT ARM  Result Value Ref Range Status   Specimen Description BLOOD RIGHT ARM  Final   Special Requests   Final    BOTTLES DRAWN AEROBIC AND ANAEROBIC Blood Culture adequate volume   Culture   Final    NO GROWTH 4 DAYS Performed at [facility], [address] [place], [place] [address]    Report Status PENDING  Incomplete     Serology:   Imaging: If present, new imagings (plain films, ct scans, and mri) have been personally visualized and interpreted; radiology reports have been reviewed. Decision making incorporated into the Impression / Recommendations.   Joe ONEIDA Passer, MD [facility] [phone] pager    10/31/2023, 12:35 PM

## 2023-10-31 NOTE — Progress Notes (Signed)
 PROGRESS NOTE    Joe Coleman  FMW:[id number] DOB: 1945-12-05 DOA: 10/29/2023 PCP: [facility]   Brief Narrative:  This 78 yrs old male with medical history significant for COPD, CKD stage IIIa, T2DM, HTN, LLE DVT on Eliquis , right tibial fracture s/p ORIF 08/25/2023 w/ subsequent wound infection s/p hardware removal 10/15/2023 who was admitted at [facility] with altered mental status and rhabdomyolysis and transferred to [facility] for orthopedic management of his wound.   Patient was recently admitted 10/14/2023-10/24/2023 for postoperative right tibial hardware associated infection.  He underwent hardware removal, I&D right proximal tibia, skin graft, long-leg splint, and wound VAC placement by [name] on 10/15/2023.  Cultures grew out Enterobacter cloacae and Klebsiella pneumoniae.  Patient was placed on 6 weeks of IV antibiotics with daptomycin  and cefepime .  He was also noted to have LLE DVT and was started on Eliquis .  PT/OT recommended SNF but patient elected to be discharged to home   Patient is found to have altered mental status, AKI, rhabdomyolysis.  Altered mental status felt potentially secondary to infection versus side effects of her cefepime  in the setting of renal dysfunction.  Antibiotics changed to IV ertapenem  as per ID recommendation.  Patient was evaluated by orthopedics [name].  CT head negative for acute intracranial abnormalities.  Renal ultrasound consistent medical renal disease. Patient is scheduled for wound VAC removal and change along leg splint at bedside tomorrow.   Assessment & Plan:   Principal Problem:   Acute kidney injury superimposed on chronic kidney disease Active Problems:   Acute metabolic encephalopathy   Rhabdomyolysis   Hardware complicating wound infection   Type 2 diabetes mellitus with peripheral neuropathy (HCC)   Chronic obstructive pulmonary disease (COPD) (HCC)   History of DVT (deep vein thrombosis)   Normocytic  anemia   AKI on CKD stage IIIa: Baseline creatinine 1.4-1.6, noted with creatinine 2.65. Continue IV fluid hydration with normal saline. Renal ultrasound 9/23 consistent with medical renal disease. UA shows proteinuria.   Rhabdomyolysis: CK up to 2788.  Could be secondary to daptomycin  which has since been discontinued.   Continue IV fluid hydration overnight.  CK level trending down.   Acute metabolic encephalopathy: >Improving Initially admitted to [facility] with encephalopathy, mentation appears to be near baseline now.   This was thought secondary to AKI versus adverse effect of cefepime  which has since been discontinued.   Gabapentin  dose was decreased. Mental status seems improved. CT head ruled out acute stroke   Right tibial hardware associated infection: s/p hardware removal and skin graft substitute placement 10/15/2023: Cast and wound VAC remain in place.  Orthopedics [name] to manage wound while in hospital.   Previous antibiotics, daptomycin  and cefepime  were discontinued due to suspected adverse effects.   Continue IV ertapenem . Patient is scheduled for wound VAC removal and change along leg splint at bedside today. ID recommended Ertapenum for 4 weeks, linezolid  2 weeks, then doxy 2 weeks.   Left lower extremity DVT: Acute distal LLE DVT on 10/23/2023.  Continue Eliquis .   Type 2 diabetes: Carb modified diet, continue SSI.   COPD: Stable, continue Trelegy Ellipta and albuterol  as needed.   Normocytic anemia: Hemoglobin stable at 8.3.   Likely multifactorial related to B12/iron  deficiency and anemia of chronic disease.   Continue B12 and iron  supplement.   DVT prophylaxis: Eliquis  Code Status: Full code Family Communication: No family at bedside Disposition Plan:    Status is: Inpatient Remains inpatient appropriate because: Severity of  illness    Consultants:  Orthopedics  Procedures:  Antimicrobials: Anti-infectives (From admission, onward)     Start     Dose/Rate Route Frequency Ordered Stop   10/30/23 1200  linezolid  (ZYVOX ) tablet 600 mg        600 mg Oral Every 12 hours 10/30/23 1101 11/13/23 0959   10/29/23 2200  ertapenem  (INVANZ ) 500 mg in sodium chloride  0.9 % 50 mL IVPB        500 mg 100 mL/hr over 30 Minutes Intravenous Every 24 hours 10/29/23 2026         Subjective: Patient was seen and examined at bedside.  Overnight events noted. Patient reports having pain in the right lower extremity. Patient is scheduled to have wound VAC removal and Dressing change today   Objective: Vitals:   10/30/23 0431 10/30/23 2022 10/31/23 0746 10/31/23 0810  BP: (!) 160/81  (!) 153/88   Pulse: 86  91   Resp: 16  18   Temp: 98.3 F (36.8 C)  98 F (36.7 C)   TempSrc: Oral     SpO2: 97% 96% 97% 98%    Intake/Output Summary (Last 24 hours) at 10/31/2023 1414 Last data filed at 10/31/2023 1100 Gross per 24 hour  Intake 460 ml  Output 1770 ml  Net -1310 ml   There were no vitals filed for this visit.  Examination:  General exam: Appears calm and comfortable , deconditioned, not in any acute distress. Respiratory system: CTA Bilaterally . Respiratory effort normal.  RR 14 Cardiovascular system: S1 & S2 heard, RRR. No JVD, murmurs, rubs, gallops or clicks.  Gastrointestinal system: Abdomen is non distended, soft and non tender. Normal bowel sounds heard. Central nervous system: Alert and oriented x 3. No focal neurological deficits. Extremities: RLE with wound VAC covered in dressing. Skin: No rashes, lesions or ulcers Psychiatry: Judgement and insight appear normal. Mood & affect appropriate.    Data Reviewed: I have personally reviewed following labs and imaging studies  CBC: Recent Labs  Lab 10/27/23 1603 10/28/23 0246 10/29/23 0535 10/30/23 0509 10/31/23 0500  WBC 13.6* 12.2* 10.2 9.6 8.7  HGB 8.6* 8.6* 8.3* 8.8* 8.7*  HCT 28.4* 28.9* 26.4* 29.0* 28.6*  MCV 90.4 91.7 88.9 89.5 90.2  PLT 288 249 262 291 292    Basic Metabolic Panel: Recent Labs  Lab 10/27/23 1603 10/28/23 0246 10/29/23 0535 10/30/23 0509 10/31/23 0500  NA 133* 137 138 136 136  K 4.6 4.4 4.4 4.2 4.5  CL 104 109 111 110 109  CO2 19* 21* 18* 19* 20*  GLUCOSE 148* 104* 96 121* 114*  BUN 48* 52* 50* 43* 41*  CREATININE 2.41* 2.65* 2.38* 2.48* 2.78*  CALCIUM 8.8* 8.5* 8.7* 8.6* 8.6*  MG  --   --   --   --  2.1  PHOS  --   --  3.0  --  4.1   GFR: Estimated Creatinine Clearance: 14.7 mL/min (A) (by C-G formula based on SCr of 2.78 mg/dL (H)). Liver Function Tests: Recent Labs  Lab 10/29/23 0535  ALBUMIN  2.2*   No results for input(s): LIPASE, AMYLASE in the last 168 hours. No results for input(s): AMMONIA in the last 168 hours. Coagulation Profile: No results for input(s): INR, PROTIME in the last 168 hours. Cardiac Enzymes: Recent Labs  Lab 10/28/23 0246 10/29/23 0535 10/30/23 0509 10/31/23 0500  CKTOTAL 1,699* 2,788* 2,145* 1,022*   BNP (last 3 results) No results for input(s): PROBNP in the last 8760 hours. HbA1C:  No results for input(s): HGBA1C in the last 72 hours. CBG: Recent Labs  Lab 10/30/23 0544 10/30/23 1303 10/30/23 1711 10/31/23 0606 10/31/23 1144  GLUCAP 118* 180* 107* 100* 135*   Lipid Profile: No results for input(s): CHOL, HDL, LDLCALC, TRIG, CHOLHDL, LDLDIRECT in the last 72 hours. Thyroid Function Tests: No results for input(s): TSH, T4TOTAL, FREET4, T3FREE, THYROIDAB in the last 72 hours. Anemia Panel: No results for input(s): VITAMINB12, FOLATE, FERRITIN, TIBC, IRON , RETICCTPCT in the last 72 hours. Sepsis Labs: Recent Labs  Lab 10/27/23 1615  LATICACIDVEN 1.3    Recent Results (from the past 240 hours)  Culture, blood (routine x 2)     Status: None (Preliminary result)   Collection Time: 10/27/23 10:19 PM   Specimen: BLOOD  Result Value Ref Range Status   Specimen Description BLOOD LEFT ARM  Final   Special Requests    Final    BOTTLES DRAWN AEROBIC AND ANAEROBIC Blood Culture adequate volume   Culture   Final    NO GROWTH 4 DAYS Performed at [facility], [address], [place], [place] [address]    Report Status PENDING  Incomplete  Culture, blood (routine x 2)     Status: None (Preliminary result)   Collection Time: 10/27/23 10:20 PM   Specimen: BLOOD RIGHT ARM  Result Value Ref Range Status   Specimen Description BLOOD RIGHT ARM  Final   Special Requests   Final    BOTTLES DRAWN AEROBIC AND ANAEROBIC Blood Culture adequate volume   Culture   Final    NO GROWTH 4 DAYS Performed at [facility], [address], [place], [place] [address]    Report Status PENDING  Incomplete    Radiology Studies: DG Tibia/Fibula Right Result Date: 10/30/2023 CLINICAL DATA:  Fracture follow-up. EXAM: RIGHT TIBIA AND FIBULA - 2 VIEW COMPARISON:  Radiograph 10/15/2023 FINDINGS: Redemonstrated comminuted tibial shaft fracture. Component likely extends to the tibial component of knee arthroplasty. Alignment is grossly unchanged from prior. Ghost tracks are again seen within the tibial shaft. Unchanged alignment of displaced proximal fibular fracture. No significant bridging callus formation about the fractures. Splint material is in place. Wound VAC anteriorly. IMPRESSION: 1. Unchanged alignment of comminuted tibial shaft fracture. Component likely extends to the tibial component of knee arthroplasty. 2. Unchanged alignment of displaced proximal fibular fracture. Electronically Signed   By: Andrea Gasman M.D.   On: 10/30/2023 15:33   CT HEAD WO CONTRAST ( ) Result Date: 10/29/2023 CLINICAL DATA:  Mental status change, unknown cause. Acute onset of generalized weakness. Fever and chills. EXAM: CT HEAD WITHOUT CONTRAST TECHNIQUE: Contiguous axial images were obtained from the base of the skull through the vertex without intravenous contrast. RADIATION DOSE REDUCTION: This exam was performed according to  the departmental dose-optimization program which includes automated exposure control, adjustment of the mA and/or kV according to patient size and/or use of iterative reconstruction technique. COMPARISON:  Head CT and MRI 04/24/2023 FINDINGS: Brain: There is no evidence of an acute infarct, intracranial hemorrhage, mass, midline shift, or extra-axial fluid collection. There is mild cerebral atrophy. Cerebral white matter hypodensities are similar to the prior CT and are nonspecific but compatible with minimal chronic small vessel ischemic disease. Vascular: Calcified atherosclerosis at the skull base. No hyperdense vessel. Skull: No acute fracture or suspicious lesion. Sinuses/Orbits: Mild mucosal thickening in the included portions of the paranasal sinuses. Left mastoidectomy. Postoperative changes to the globes. Other: None. IMPRESSION: No evidence of acute intracranial abnormality. Electronically Signed  By: Dasie Hamburg M.D.   On: 10/29/2023 16:04   Scheduled Meds:  apixaban   5 mg Oral BID   budesonide -glycopyrrolate -formoterol   2 puff Inhalation BID   Chlorhexidine  Gluconate Cloth  6 each Topical Daily   cyanocobalamin   1,000 mcg Oral Daily   feeding supplement (GLUCERNA SHAKE)  237 mL Oral TID BM   ferrous sulfate   325 mg Oral Q breakfast   gabapentin   100 mg Oral QHS   insulin  aspart  0-5 Units Subcutaneous QHS   insulin  aspart  0-9 Units Subcutaneous TID WC   linezolid   600 mg Oral Q12H   polyethylene glycol  17 g Oral Daily   sodium chloride  flush  10-40 mL Intracatheter Q12H   Continuous Infusions:  ertapenem  Stopped (10/30/23 2153)     LOS: 2 days    Time spent: 35 mins    Darcel Dawley, MD [facility]   If 7PM-7AM, please contact night-coverage

## 2023-11-01 DIAGNOSIS — N189 Chronic kidney disease, unspecified: Secondary | ICD-10-CM | POA: Diagnosis not present

## 2023-11-01 DIAGNOSIS — N179 Acute kidney failure, unspecified: Secondary | ICD-10-CM | POA: Diagnosis not present

## 2023-11-01 LAB — CBC
HCT: 27.3 % — ABNORMAL LOW (ref 39.0–52.0)
Hemoglobin: 8.3 g/dL — ABNORMAL LOW (ref 13.0–17.0)
MCH: 27.5 pg (ref 26.0–34.0)
MCHC: 30.4 g/dL (ref 30.0–36.0)
MCV: 90.4 fL (ref 80.0–100.0)
Platelets: 288 K/uL (ref 150–400)
RBC: 3.02 MIL/uL — ABNORMAL LOW (ref 4.22–5.81)
RDW: 16.3 % — ABNORMAL HIGH (ref 11.5–15.5)
WBC: 9.2 K/uL (ref 4.0–10.5)
nRBC: 0 % (ref 0.0–0.2)

## 2023-11-01 LAB — BASIC METABOLIC PANEL WITH GFR
Anion gap: 6 (ref 5–15)
BUN: 40 mg/dL — ABNORMAL HIGH (ref 8–23)
CO2: 21 mmol/L — ABNORMAL LOW (ref 22–32)
Calcium: 8.2 mg/dL — ABNORMAL LOW (ref 8.9–10.3)
Chloride: 108 mmol/L (ref 98–111)
Creatinine, Ser: 3.18 mg/dL — ABNORMAL HIGH (ref 0.61–1.24)
GFR, Estimated: 19 mL/min — ABNORMAL LOW (ref >60)
Glucose, Bld: 129 mg/dL — ABNORMAL HIGH (ref 70–99)
Potassium: 4.2 mmol/L (ref 3.5–5.1)
Sodium: 135 mmol/L (ref 135–145)

## 2023-11-01 LAB — GLUCOSE, CAPILLARY
Glucose-Capillary: 109 mg/dL — ABNORMAL HIGH (ref 70–99)
Glucose-Capillary: 121 mg/dL — ABNORMAL HIGH (ref 70–99)
Glucose-Capillary: 177 mg/dL — ABNORMAL HIGH (ref 70–99)
Glucose-Capillary: 142 mg/dL — ABNORMAL HIGH (ref 70–99)

## 2023-11-01 LAB — CULTURE, BLOOD (ROUTINE X 2)
Culture: NO GROWTH
Culture: NO GROWTH
Special Requests: ADEQUATE
Special Requests: ADEQUATE

## 2023-11-01 LAB — PHOSPHORUS: Phosphorus: 4 mg/dL (ref 2.5–4.6)

## 2023-11-01 LAB — MAGNESIUM: Magnesium: 2.1 mg/dL (ref 1.7–2.4)

## 2023-11-01 NOTE — TOC Progression Note (Signed)
 Transition of Care [facility]) - Progression Note    Patient Details  Name: Joe Coleman MRN: [id number] Date of Birth: 08/05/1945  [facility] CM/SW Contact  Bridget Cordella Simmonds, LCSW Phone Number: 11/01/2023, 3:33 PM  Clinical Narrative:    Attempted to call niece [name], left message CSW spoke with pt in room, he called [name], she is on her way, will be here in 30 minutes. 1530: CSW spoke with [name] by phone, bed offers provided, she would like to accept offer at [facility].   Auth request submitted in [place].    Expected Discharge Plan: Skilled Nursing Facility Barriers to Discharge: Continued Medical Work up, SNF Pending bed offer               Expected Discharge Plan and Services   Discharge Planning Services: CM Consult Post Acute Care Choice: Skilled Nursing Facility Living arrangements for the past 2 months: Single Family Home                                       Social Drivers of Health (SDOH) Interventions SDOH Screenings   Food Insecurity: No Food Insecurity (10/29/2023)  Housing: Low Risk  (10/29/2023)  Transportation Needs: No Transportation Needs (10/29/2023)  Utilities: Not At Risk (10/29/2023)  Financial Resource Strain: Medium Risk (05/02/2021)   Received from [facility]  Physical Activity: Inactive (08/25/2018)   Received from [facility]  Social Connections: Unknown (10/29/2023)  Stress: No Stress Concern Present (08/25/2018)   Received from [facility]  Tobacco Use: Medium Risk (10/27/2023)  Health Literacy: High Risk (05/04/2022)   Received from [facility]    Readmission Risk Interventions    09/01/2023   12:00 PM  Readmission Risk Prevention Plan  Transportation Screening Complete  Medication Review (RN Care Manager) Complete  PCP or Specialist appointment within 3-5 days of discharge Complete  HRI or Home Care Consult Complete  SW Recovery Care/Counseling Consult Complete  Palliative Care Screening Not  Applicable  Skilled Nursing Facility Complete

## 2023-11-01 NOTE — Plan of Care (Signed)

## 2023-11-01 NOTE — Progress Notes (Signed)
 PROGRESS NOTE    FREDIE MAJANO  FMW:[id number] DOB: Jan 16, 1946 DOA: 10/29/2023 PCP: [facility]   Brief Narrative:  This 78 yrs old male with medical history significant for COPD, CKD stage IIIa, T2DM, HTN, LLE DVT on Eliquis , right tibial fracture s/p ORIF 08/25/2023 w/ subsequent wound infection s/p hardware removal 10/15/2023 who was admitted at [facility] with altered mental status and rhabdomyolysis and transferred to [facility] for orthopedic management of his wound.   Patient was recently admitted 10/14/2023-10/24/2023 for postoperative right tibial hardware associated infection.  He underwent hardware removal, I&D right proximal tibia, skin graft, long-leg splint, and wound VAC placement by [name] on 10/15/2023.  Cultures grew out Enterobacter cloacae and Klebsiella pneumoniae.  Patient was placed on 6 weeks of IV antibiotics with daptomycin  and cefepime .  He was also noted to have LLE DVT and was started on Eliquis .  PT/OT recommended SNF but patient elected to be discharged to home   Patient is found to have altered mental status, AKI, rhabdomyolysis.  Altered mental status felt potentially secondary to infection versus side effects of her cefepime  in the setting of renal dysfunction.  Antibiotics changed to IV ertapenem  as per ID recommendation.  Patient was evaluated by orthopedics [name].  CT head negative for acute intracranial abnormalities.  Renal ultrasound consistent medical renal disease. Patient is scheduled for wound VAC removal and change along leg splint at bedside tomorrow.   Assessment & Plan:   Principal Problem:   Acute kidney injury superimposed on chronic kidney disease Active Problems:   Acute metabolic encephalopathy   Rhabdomyolysis   Hardware complicating wound infection   Type 2 diabetes mellitus with peripheral neuropathy (HCC)   Chronic obstructive pulmonary disease (COPD) (HCC)   History of DVT (deep vein thrombosis)   Normocytic  anemia   AKI on CKD stage IIIa: Baseline creatinine 1.4-1.6, noted with creatinine 2.65 trended up to 3.18. Continue IV fluid hydration with normal saline. Renal ultrasound 9/23 consistent with medical renal disease. UA shows proteinuria.   Rhabdomyolysis: CK up to 2788.  Could be secondary to daptomycin  which has since been discontinued.   Continue IV fluid hydration .  CK level trending down.   Acute metabolic encephalopathy: >Improved Initially admitted to [facility] with encephalopathy, mentation appears to be near baseline now.   This was thought secondary to AKI versus adverse effect of cefepime  which has since been discontinued.   Gabapentin  dose was decreased. Mental status seems improved. CT head ruled out acute stroke   Right tibial hardware associated infection: s/p hardware removal and skin graft substitute placement 10/15/2023: Cast and wound VAC remain in place.  Orthopedics [name] to manage wound while in hospital.   Previous antibiotics, daptomycin  and cefepime  were discontinued due to suspected adverse effects.   Continue IV ertapenem . Patient underwent wound VAC removal and change along leg splint at bedside yesterday. ID recommended Ertapenum for 4 weeks, linezolid  2 weeks, then doxy 2 weeks. Nonweightbearing right lower extremity ,  maintain knee immobilizer. Follow-up orthopedics 7 to 10 days after discharge for wound check. Patient can return back to SNF once bed available.  Left lower extremity DVT: Acute distal LLE DVT on 10/23/2023.  Continue Eliquis .   Type 2 diabetes: Carb modified diet, continue SSI.   COPD: Stable, continue Trelegy Ellipta and albuterol  as needed.   Normocytic anemia: Hemoglobin stable at 8.3.   Likely multifactorial related to B12/iron  deficiency and anemia of chronic disease.   Continue B12 and iron   supplement.   DVT prophylaxis: Eliquis  Code Status: Full code Family Communication: No family at bedside Disposition Plan:     Status is: Inpatient Remains inpatient appropriate because: Severity of illness    Consultants:  Orthopedics  Procedures:  Antimicrobials: Anti-infectives (From admission, onward)    Start     Dose/Rate Route Frequency Ordered Stop   10/30/23 1200  linezolid  (ZYVOX ) tablet 600 mg        600 mg Oral Every 12 hours 10/30/23 1101 11/13/23 0959   10/29/23 2200  ertapenem  (INVANZ ) 500 mg in sodium chloride  0.9 % 50 mL IVPB        500 mg 100 mL/hr over 30 Minutes Intravenous Every 24 hours 10/29/23 2026         Subjective: Patient was seen and examined at bedside.  Overnight events noted. Patient reports having pain in the right lower extremity. S/p wound VAC removal and Dressing change yesterday. Patient reports feeling much better.   Objective: Vitals:   11/01/23 0725 11/01/23 0824 11/01/23 0927 11/01/23 1151  BP: 133/67  (!) 129/95 (!) 145/76  Pulse: 78  78 84  Resp: 16  20 12   Temp: 97.8 F (36.6 C)  97.7 F (36.5 C) 97.9 F (36.6 C)  TempSrc: Oral  Oral Oral  SpO2: 98% 99% 98% 97%    Intake/Output Summary (Last 24 hours) at 11/01/2023 1203 Last data filed at 11/01/2023 1100 Gross per 24 hour  Intake 240 ml  Output 1600 ml  Net -1360 ml   There were no vitals filed for this visit.  Examination:  General exam: Appears calm and comfortable , deconditioned, not in any acute distress. Respiratory system: CTA Bilaterally . Respiratory effort normal.  RR 17 Cardiovascular system: S1 & S2 heard, RRR. No JVD, murmurs, rubs, gallops or clicks.  Gastrointestinal system: Abdomen is non distended, soft and non tender. Normal bowel sounds heard. Central nervous system: Alert and oriented x 3. No focal neurological deficits. Extremities: RLE covered in dressing. Skin: No rashes, lesions or ulcers Psychiatry: Judgement and insight appear normal. Mood & affect appropriate.    Data Reviewed: I have personally reviewed following labs and imaging studies  CBC: Recent Labs   Lab 10/28/23 0246 10/29/23 0535 10/30/23 0509 10/31/23 0500 11/01/23 0509  WBC 12.2* 10.2 9.6 8.7 9.2  HGB 8.6* 8.3* 8.8* 8.7* 8.3*  HCT 28.9* 26.4* 29.0* 28.6* 27.3*  MCV 91.7 88.9 89.5 90.2 90.4  PLT 249 262 291 292 288   Basic Metabolic Panel: Recent Labs  Lab 10/28/23 0246 10/29/23 0535 10/30/23 0509 10/31/23 0500 11/01/23 0509  NA 137 138 136 136 135  K 4.4 4.4 4.2 4.5 4.2  CL 109 111 110 109 108  CO2 21* 18* 19* 20* 21*  GLUCOSE 104* 96 121* 114* 129*  BUN 52* 50* 43* 41* 40*  CREATININE 2.65* 2.38* 2.48* 2.78* 3.18*  CALCIUM 8.5* 8.7* 8.6* 8.6* 8.2*  MG  --   --   --  2.1 2.1  PHOS  --  3.0  --  4.1 4.0   GFR: Estimated Creatinine Clearance: 12.8 mL/min (A) (by C-G formula based on SCr of 3.18 mg/dL (H)). Liver Function Tests: Recent Labs  Lab 10/29/23 0535  ALBUMIN  2.2*   No results for input(s): LIPASE, AMYLASE in the last 168 hours. No results for input(s): AMMONIA in the last 168 hours. Coagulation Profile: No results for input(s): INR, PROTIME in the last 168 hours. Cardiac Enzymes: Recent Labs  Lab 10/28/23 0246 10/29/23 0535  10/30/23 0509 10/31/23 0500  CKTOTAL 1,699* 2,788* 2,145* 1,022*   BNP (last 3 results) No results for input(s): PROBNP in the last 8760 hours. HbA1C: No results for input(s): HGBA1C in the last 72 hours. CBG: Recent Labs  Lab 10/31/23 1144 10/31/23 1626 10/31/23 2108 11/01/23 0618 11/01/23 1141  GLUCAP 135* 101* 133* 109* 121*   Lipid Profile: No results for input(s): CHOL, HDL, LDLCALC, TRIG, CHOLHDL, LDLDIRECT in the last 72 hours. Thyroid Function Tests: No results for input(s): TSH, T4TOTAL, FREET4, T3FREE, THYROIDAB in the last 72 hours. Anemia Panel: No results for input(s): VITAMINB12, FOLATE, FERRITIN, TIBC, IRON , RETICCTPCT in the last 72 hours. Sepsis Labs: Recent Labs  Lab 10/27/23 1615  LATICACIDVEN 1.3    Recent Results (from the past 240  hours)  Culture, blood (routine x 2)     Status: None   Collection Time: 10/27/23 10:19 PM   Specimen: BLOOD  Result Value Ref Range Status   Specimen Description BLOOD LEFT ARM  Final   Special Requests   Final    BOTTLES DRAWN AEROBIC AND ANAEROBIC Blood Culture adequate volume   Culture   Final    NO GROWTH 5 DAYS Performed at [facility], [address] [place], [place] [address]    Report Status 11/01/2023 FINAL  Final  Culture, blood (routine x 2)     Status: None   Collection Time: 10/27/23 10:20 PM   Specimen: BLOOD RIGHT ARM  Result Value Ref Range Status   Specimen Description BLOOD RIGHT ARM  Final   Special Requests   Final    BOTTLES DRAWN AEROBIC AND ANAEROBIC Blood Culture adequate volume   Culture   Final    NO GROWTH 5 DAYS Performed at [facility], [address], [place], [place] [address]    Report Status 11/01/2023 FINAL  Final    Radiology Studies: DG Tibia/Fibula Right Result Date: 10/30/2023 CLINICAL DATA:  Fracture follow-up. EXAM: RIGHT TIBIA AND FIBULA - 2 VIEW COMPARISON:  Radiograph 10/15/2023 FINDINGS: Redemonstrated comminuted tibial shaft fracture. Component likely extends to the tibial component of knee arthroplasty. Alignment is grossly unchanged from prior. Ghost tracks are again seen within the tibial shaft. Unchanged alignment of displaced proximal fibular fracture. No significant bridging callus formation about the fractures. Splint material is in place. Wound VAC anteriorly. IMPRESSION: 1. Unchanged alignment of comminuted tibial shaft fracture. Component likely extends to the tibial component of knee arthroplasty. 2. Unchanged alignment of displaced proximal fibular fracture. Electronically Signed   By: Andrea Gasman M.D.   On: 10/30/2023 15:33   Scheduled Meds:  apixaban   5 mg Oral BID   budesonide -glycopyrrolate -formoterol   2 puff Inhalation BID   Chlorhexidine  Gluconate Cloth  6 each Topical Daily   cyanocobalamin    1,000 mcg Oral Daily   feeding supplement (GLUCERNA SHAKE)  237 mL Oral TID BM   ferrous sulfate   325 mg Oral Q breakfast   gabapentin   100 mg Oral QHS   insulin  aspart  0-5 Units Subcutaneous QHS   insulin  aspart  0-9 Units Subcutaneous TID WC   linezolid   600 mg Oral Q12H   polyethylene glycol  17 g Oral Daily   sodium chloride  flush  10-40 mL Intracatheter Q12H   Continuous Infusions:  ertapenem  500 mg (10/31/23 2214)     LOS: 3 days    Time spent: 35 mins    Darcel Dawley, MD [facility]   If 7PM-7AM, please contact night-coverage

## 2023-11-02 DIAGNOSIS — N179 Acute kidney failure, unspecified: Secondary | ICD-10-CM | POA: Diagnosis not present

## 2023-11-02 DIAGNOSIS — N189 Chronic kidney disease, unspecified: Secondary | ICD-10-CM | POA: Diagnosis not present

## 2023-11-02 LAB — PHOSPHORUS: Phosphorus: 4.5 mg/dL (ref 2.5–4.6)

## 2023-11-02 LAB — CBC
HCT: 28.7 % — ABNORMAL LOW (ref 39.0–52.0)
Hemoglobin: 8.7 g/dL — ABNORMAL LOW (ref 13.0–17.0)
MCH: 27.7 pg (ref 26.0–34.0)
MCHC: 30.3 g/dL (ref 30.0–36.0)
MCV: 91.4 fL (ref 80.0–100.0)
Platelets: 287 K/uL (ref 150–400)
RBC: 3.14 MIL/uL — ABNORMAL LOW (ref 4.22–5.81)
RDW: 16.5 % — ABNORMAL HIGH (ref 11.5–15.5)
WBC: 11.7 K/uL — ABNORMAL HIGH (ref 4.0–10.5)
nRBC: 0 % (ref 0.0–0.2)

## 2023-11-02 LAB — GLUCOSE, CAPILLARY
Glucose-Capillary: 137 mg/dL — ABNORMAL HIGH (ref 70–99)
Glucose-Capillary: 97 mg/dL (ref 70–99)
Glucose-Capillary: 132 mg/dL — ABNORMAL HIGH (ref 70–99)
Glucose-Capillary: 160 mg/dL — ABNORMAL HIGH (ref 70–99)
Glucose-Capillary: 112 mg/dL — ABNORMAL HIGH (ref 70–99)

## 2023-11-02 LAB — BASIC METABOLIC PANEL WITH GFR
Anion gap: 7 (ref 5–15)
BUN: 40 mg/dL — ABNORMAL HIGH (ref 8–23)
CO2: 21 mmol/L — ABNORMAL LOW (ref 22–32)
Calcium: 8.2 mg/dL — ABNORMAL LOW (ref 8.9–10.3)
Chloride: 108 mmol/L (ref 98–111)
Creatinine, Ser: 3.19 mg/dL — ABNORMAL HIGH (ref 0.61–1.24)
GFR, Estimated: 19 mL/min — ABNORMAL LOW (ref >60)
Glucose, Bld: 167 mg/dL — ABNORMAL HIGH (ref 70–99)
Potassium: 4.5 mmol/L (ref 3.5–5.1)
Sodium: 136 mmol/L (ref 135–145)

## 2023-11-02 LAB — MAGNESIUM: Magnesium: 1.9 mg/dL (ref 1.7–2.4)

## 2023-11-02 MED ORDER — DOXYCYCLINE HYCLATE 100 MG PO TABS: 100.0000 mg | ORAL_TABLET | Freq: Two times a day (BID) | ORAL | Status: DC

## 2023-11-02 MED ORDER — MENTHOL 3 MG MT LOZG
1.0000 | LOZENGE | OROMUCOSAL | Status: DC | PRN
  Administered 2023-11-02: 3 mg via ORAL
  Filled 2023-11-02: qty 9

## 2023-11-02 MED ORDER — ZINC OXIDE 40 % EX OINT
TOPICAL_OINTMENT | CUTANEOUS | Status: DC | PRN
  Administered 2023-11-02: 1 via TOPICAL
  Filled 2023-11-02: qty 57

## 2023-11-02 NOTE — Progress Notes (Signed)
 PROGRESS NOTE    Joe Coleman  FMW:[id number] DOB: [birth year] DOA: 10/29/2023 PCP: [facility]   Brief Narrative:  This 78 yrs old male with medical history significant for COPD, CKD stage IIIa, T2DM, HTN, LLE DVT on Eliquis , right tibial fracture s/p ORIF 08/25/2023 w/ subsequent wound infection s/p hardware removal 10/15/2023 who was admitted at [facility] with altered mental status and rhabdomyolysis and transferred to [facility] for orthopedic management of his wound.   Patient was recently admitted 10/14/2023-10/24/2023 for postoperative right tibial hardware associated infection.  He underwent hardware removal, I&D right proximal tibia, skin graft, long-leg splint, and wound VAC placement by [name] on 10/15/2023.  Cultures grew out Enterobacter cloacae and Klebsiella pneumoniae.  Patient was placed on 6 weeks of IV antibiotics with daptomycin  and cefepime .  He was also noted to have LLE DVT and was started on Eliquis .  PT/OT recommended SNF but patient elected to be discharged to home   Patient is found to have altered mental status, AKI, rhabdomyolysis.  Altered mental status felt potentially secondary to infection versus side effects of her cefepime  in the setting of renal dysfunction.  Antibiotics changed to IV ertapenem  as per ID recommendation.  Patient was evaluated by orthopedics [name].  CT head negative for acute intracranial abnormalities.  Renal ultrasound consistent medical renal disease. Patient is scheduled for wound VAC removal and change along leg splint at bedside tomorrow.   Assessment & Plan:   Principal Problem:   Acute kidney injury superimposed on chronic kidney disease Active Problems:   Acute metabolic encephalopathy   Rhabdomyolysis   Hardware complicating wound infection   Type 2 diabetes mellitus with peripheral neuropathy (HCC)   Chronic obstructive pulmonary disease (COPD) (HCC)   History of DVT (deep vein thrombosis)   Normocytic  anemia   AKI on CKD stage IIIa: Baseline creatinine 1.4-1.6, noted with creatinine 2.65 trended up to 3.18. Continue IV fluid hydration with normal saline. Renal ultrasound 9/23 consistent with medical renal disease. UA shows proteinuria.   Rhabdomyolysis: CK up to 2788.  Could be secondary to daptomycin  which has since been discontinued.   Continue IV fluid hydration .  CK level trending down.   Acute metabolic encephalopathy: >Improved Initially admitted to [facility] with encephalopathy, mentation appears to be near baseline now.   This was thought secondary to AKI versus adverse effect of cefepime  which has since been discontinued.   Gabapentin  dose was decreased. Mental status seems improved. CT head ruled out acute stroke   Right tibial hardware associated infection: s/p hardware removal and skin graft substitute placement 10/15/2023: Cast and wound VAC remain in place.  Orthopedics [name] to manage wound while in hospital.   Previous antibiotics, daptomycin  and cefepime  were discontinued due to suspected adverse effects.   Continue IV ertapenem . Patient underwent wound VAC removal and change along leg splint at bedside yesterday. ID recommended Ertapenum for 4 weeks,followed by linezolid  2 weeks, then doxy 2 weeks. Nonweightbearing right lower extremity ,  maintain knee immobilizer. Follow-up orthopedics 7 to 10 days after discharge for wound check. Patient can return back to SNF once bed available.  Left lower extremity DVT: Acute distal LLE DVT on 10/23/2023.  Continue Eliquis .   Type 2 diabetes: Carb modified diet, continue SSI.   COPD: Stable, continue Trelegy Ellipta and albuterol  as needed.   Normocytic anemia: Hemoglobin stable at 8.3.   Likely multifactorial related to B12/iron  deficiency and anemia of chronic disease.   Continue B12 and  iron  supplement.   DVT prophylaxis: Eliquis  Code Status: Full code Family Communication: No family at bedside Disposition  Plan:    Status is: Inpatient Remains inpatient appropriate because: Severity of illness    Consultants:  Orthopedics  Procedures:  Antimicrobials: Anti-infectives (From admission, onward)    Start     Dose/Rate Route Frequency Ordered Stop   11/13/23 1000  doxycycline  (VIBRA -TABS) tablet 100 mg        100 mg Oral Every 12 hours 11/02/23 1017 11/26/23 0959   10/30/23 1200  linezolid  (ZYVOX ) tablet 600 mg        600 mg Oral Every 12 hours 10/30/23 1101 11/13/23 0959   10/29/23 2200  ertapenem  (INVANZ ) 500 mg in sodium chloride  0.9 % 50 mL IVPB        500 mg 100 mL/hr over 30 Minutes Intravenous Every 24 hours 10/29/23 2026 11/25/23 2359       Subjective: Patient was seen and examined at bedside.  Overnight events noted. Patient reports RLE pain has much improved. S/p wound VAC removal and Dressing change yesterday. Patient reports feeling much better.  He reports having sore throat, asking for lozenges.   Objective: Vitals:   11/01/23 1923 11/01/23 2109 11/02/23 0423 11/02/23 0754  BP: (!) 149/72  139/73 123/70  Pulse: 90  83 82  Resp: 17  18 16   Temp: 97.6 F (36.4 C)  98.1 F (36.7 C) 97.9 F (36.6 C)  TempSrc: Oral  Oral Oral  SpO2: (!) 88% 97% 97% 96%    Intake/Output Summary (Last 24 hours) at 11/02/2023 1107 Last data filed at 11/02/2023 0900 Gross per 24 hour  Intake 120 ml  Output 450 ml  Net -330 ml   There were no vitals filed for this visit.  Examination:  General exam: Appears calm and comfortable , deconditioned, not in any acute distress. Respiratory system: CTA Bilaterally . Respiratory effort normal.  RR 18 Cardiovascular system: S1 & S2 heard, RRR. No JVD, murmurs, rubs, gallops or clicks.  Gastrointestinal system: Abdomen is non distended, soft and non tender. Normal bowel sounds heard. Central nervous system: Alert and oriented x 3. No focal neurological deficits. Extremities: RLE covered in dressing. Skin: No rashes, lesions or  ulcers Psychiatry: Judgement and insight appear normal. Mood & affect appropriate.    Data Reviewed: I have personally reviewed following labs and imaging studies  CBC: Recent Labs  Lab 10/29/23 0535 10/30/23 0509 10/31/23 0500 11/01/23 0509 11/02/23 0118  WBC 10.2 9.6 8.7 9.2 11.7*  HGB 8.3* 8.8* 8.7* 8.3* 8.7*  HCT 26.4* 29.0* 28.6* 27.3* 28.7*  MCV 88.9 89.5 90.2 90.4 91.4  PLT 262 291 292 288 287   Basic Metabolic Panel: Recent Labs  Lab 10/29/23 0535 10/30/23 0509 10/31/23 0500 11/01/23 0509 11/02/23 0118  NA 138 136 136 135 136  K 4.4 4.2 4.5 4.2 4.5  CL 111 110 109 108 108  CO2 18* 19* 20* 21* 21*  GLUCOSE 96 121* 114* 129* 167*  BUN 50* 43* 41* 40* 40*  CREATININE 2.38* 2.48* 2.78* 3.18* 3.19*  CALCIUM 8.7* 8.6* 8.6* 8.2* 8.2*  MG  --   --  2.1 2.1 1.9  PHOS 3.0  --  4.1 4.0 4.5   GFR: Estimated Creatinine Clearance: 12.8 mL/min (A) (by C-G formula based on SCr of 3.19 mg/dL (H)). Liver Function Tests: Recent Labs  Lab 10/29/23 0535  ALBUMIN  2.2*   No results for input(s): LIPASE, AMYLASE in the last 168 hours. No results  for input(s): AMMONIA in the last 168 hours. Coagulation Profile: No results for input(s): INR, PROTIME in the last 168 hours. Cardiac Enzymes: Recent Labs  Lab 10/28/23 0246 10/29/23 0535 10/30/23 0509 10/31/23 0500  CKTOTAL 1,699* 2,788* 2,145* 1,022*   BNP (last 3 results) No results for input(s): PROBNP in the last 8760 hours. HbA1C: No results for input(s): HGBA1C in the last 72 hours. CBG: Recent Labs  Lab 11/01/23 0618 11/01/23 1141 11/01/23 1645 11/01/23 2207 11/02/23 0646  GLUCAP 109* 121* 177* 142* 97   Lipid Profile: No results for input(s): CHOL, HDL, LDLCALC, TRIG, CHOLHDL, LDLDIRECT in the last 72 hours. Thyroid Function Tests: No results for input(s): TSH, T4TOTAL, FREET4, T3FREE, THYROIDAB in the last 72 hours. Anemia Panel: No results for input(s):  VITAMINB12, FOLATE, FERRITIN, TIBC, IRON , RETICCTPCT in the last 72 hours. Sepsis Labs: Recent Labs  Lab 10/27/23 1615  LATICACIDVEN 1.3    Recent Results (from the past 240 hours)  Culture, blood (routine x 2)     Status: None   Collection Time: 10/27/23 10:19 PM   Specimen: BLOOD  Result Value Ref Range Status   Specimen Description BLOOD LEFT ARM  Final   Special Requests   Final    BOTTLES DRAWN AEROBIC AND ANAEROBIC Blood Culture adequate volume   Culture   Final    NO GROWTH 5 DAYS Performed at [facility], [address], [place], [place] [address]    Report Status 11/01/2023 FINAL  Final  Culture, blood (routine x 2)     Status: None   Collection Time: 10/27/23 10:20 PM   Specimen: BLOOD RIGHT ARM  Result Value Ref Range Status   Specimen Description BLOOD RIGHT ARM  Final   Special Requests   Final    BOTTLES DRAWN AEROBIC AND ANAEROBIC Blood Culture adequate volume   Culture   Final    NO GROWTH 5 DAYS Performed at [facility], [address] [place], [place] [address]    Report Status 11/01/2023 FINAL  Final    Radiology Studies: No results found.  Scheduled Meds:  apixaban   5 mg Oral BID   budesonide -glycopyrrolate -formoterol   2 puff Inhalation BID   Chlorhexidine  Gluconate Cloth  6 each Topical Daily   cyanocobalamin   1,000 mcg Oral Daily   [START ON 11/13/2023] doxycycline   100 mg Oral Q12H   feeding supplement (GLUCERNA SHAKE)  237 mL Oral TID BM   ferrous sulfate   325 mg Oral Q breakfast   gabapentin   100 mg Oral QHS   insulin  aspart  0-5 Units Subcutaneous QHS   insulin  aspart  0-9 Units Subcutaneous TID WC   linezolid   600 mg Oral Q12H   polyethylene glycol  17 g Oral Daily   sodium chloride  flush  10-40 mL Intracatheter Q12H   Continuous Infusions:  ertapenem  500 mg (11/01/23 2314)     LOS: 4 days    Time spent: 35 mins    Darcel Dawley, MD [facility]   If 7PM-7AM, please contact  night-coverage

## 2023-11-02 NOTE — Consult Note (Signed)
 WOC Nurse Consult Note: Reason for Consult: ? wound to buttocks, blistered and no longer Stage 1  Wound type: Pressure Injury POA: Yes/No/NA Measurement: Wound bed: Drainage (amount, consistency, odor)  Periwound: Dressing procedure/placement/frequency: Conservative sharp wound debridement (CSWD performed at the bedside):

## 2023-11-02 NOTE — TOC Progression Note (Signed)
 Transition of Care [facility]) - Progression Note    Patient Details  Name: Joe Coleman MRN: [id number] Date of Birth: May 30, 1945  [facility] CM/SW Contact  Dino CHRISTELLA Au, LCSWA Phone Number: 11/02/2023, 9:20 AM  Clinical Narrative:     SW spoke with Annabella [facility] [phone]) shara still pending.     Expected Discharge Plan: Skilled Nursing Facility Barriers to Discharge: Continued Medical Work up, SNF Pending bed offer      Expected Discharge Plan and Services   Discharge Planning Services: CM Consult Post Acute Care Choice: Skilled Nursing Facility Living arrangements for the past 2 months: Single Family Home                                       Social Drivers of Health (SDOH) Interventions SDOH Screenings   Food Insecurity: No Food Insecurity (10/29/2023)  Housing: Low Risk  (10/29/2023)  Transportation Needs: No Transportation Needs (10/29/2023)  Utilities: Not At Risk (10/29/2023)  Financial Resource Strain: Medium Risk (05/02/2021)   Received from [facility]  Physical Activity: Inactive (08/25/2018)   Received from [facility]  Social Connections: Unknown (10/29/2023)  Stress: No Stress Concern Present (08/25/2018)   Received from [facility]  Tobacco Use: Medium Risk (10/27/2023)  Health Literacy: High Risk (05/04/2022)   Received from [facility]    Readmission Risk Interventions    09/01/2023   12:00 PM  Readmission Risk Prevention Plan  Transportation Screening Complete  Medication Review (RN Care Manager) Complete  PCP or Specialist appointment within 3-5 days of discharge Complete  HRI or Home Care Consult Complete  SW Recovery Care/Counseling Consult Complete  Palliative Care Screening Not Applicable  Skilled Nursing Facility Complete

## 2023-11-02 NOTE — Plan of Care (Signed)

## 2023-11-03 DIAGNOSIS — N189 Chronic kidney disease, unspecified: Secondary | ICD-10-CM | POA: Diagnosis not present

## 2023-11-03 DIAGNOSIS — N179 Acute kidney failure, unspecified: Secondary | ICD-10-CM | POA: Diagnosis not present

## 2023-11-03 LAB — BASIC METABOLIC PANEL WITH GFR
Anion gap: 10 (ref 5–15)
BUN: 32 mg/dL — ABNORMAL HIGH (ref 8–23)
CO2: 20 mmol/L — ABNORMAL LOW (ref 22–32)
Calcium: 8.1 mg/dL — ABNORMAL LOW (ref 8.9–10.3)
Chloride: 104 mmol/L (ref 98–111)
Creatinine, Ser: 3.26 mg/dL — ABNORMAL HIGH (ref 0.61–1.24)
GFR, Estimated: 18 mL/min — ABNORMAL LOW (ref >60)
Glucose, Bld: 188 mg/dL — ABNORMAL HIGH (ref 70–99)
Potassium: 4.5 mmol/L (ref 3.5–5.1)
Sodium: 134 mmol/L — ABNORMAL LOW (ref 135–145)

## 2023-11-03 LAB — CBC
HCT: 29.4 % — ABNORMAL LOW (ref 39.0–52.0)
Hemoglobin: 8.9 g/dL — ABNORMAL LOW (ref 13.0–17.0)
MCH: 27.8 pg (ref 26.0–34.0)
MCHC: 30.3 g/dL (ref 30.0–36.0)
MCV: 91.9 fL (ref 80.0–100.0)
Platelets: 312 K/uL (ref 150–400)
RBC: 3.2 MIL/uL — ABNORMAL LOW (ref 4.22–5.81)
RDW: 16.8 % — ABNORMAL HIGH (ref 11.5–15.5)
WBC: 11.2 K/uL — ABNORMAL HIGH (ref 4.0–10.5)
nRBC: 0 % (ref 0.0–0.2)

## 2023-11-03 LAB — GLUCOSE, CAPILLARY
Glucose-Capillary: 108 mg/dL — ABNORMAL HIGH (ref 70–99)
Glucose-Capillary: 133 mg/dL — ABNORMAL HIGH (ref 70–99)
Glucose-Capillary: 115 mg/dL — ABNORMAL HIGH (ref 70–99)
Glucose-Capillary: 141 mg/dL — ABNORMAL HIGH (ref 70–99)

## 2023-11-03 LAB — CK: Total CK: 101 U/L (ref 49–397)

## 2023-11-03 MED ORDER — GERHARDT'S BUTT CREAM
TOPICAL_CREAM | Freq: Two times a day (BID) | CUTANEOUS | Status: DC
  Filled 2023-11-03: qty 60

## 2023-11-03 NOTE — Progress Notes (Signed)
 PROGRESS NOTE    Joe Coleman  FMW:[id number] DOB: September 05, 1945 DOA: 10/29/2023 PCP: [facility]   Brief Narrative:  This 78 yrs old male with medical history significant for COPD, CKD stage IIIa, T2DM, HTN, LLE DVT on Eliquis , right tibial fracture s/p ORIF 08/25/2023 w/ subsequent wound infection s/p hardware removal 10/15/2023 who was admitted at [facility] with altered mental status and rhabdomyolysis and transferred to [facility] for orthopedic management of his wound.   Patient was recently admitted 10/14/2023-10/24/2023 for postoperative right tibial hardware associated infection.  He underwent hardware removal, I&D right proximal tibia, skin graft, long-leg splint, and wound VAC placement by [name] on 10/15/2023.  Cultures grew out Enterobacter cloacae and Klebsiella pneumoniae.  Patient was placed on 6 weeks of IV antibiotics with daptomycin  and cefepime .  He was also noted to have LLE DVT and was started on Eliquis .  PT/OT recommended SNF but patient elected to be discharged to home   Patient is found to have altered mental status, AKI, rhabdomyolysis.  Altered mental status felt potentially secondary to infection versus side effects of her cefepime  in the setting of renal dysfunction.  Antibiotics changed to IV ertapenem  as per ID recommendation.  Patient was evaluated by orthopedics [name].  CT head negative for acute intracranial abnormalities.  Renal ultrasound consistent medical renal disease. Patient is scheduled for wound VAC removal and change along leg splint at bedside tomorrow.   Assessment & Plan:   Principal Problem:   Acute kidney injury superimposed on chronic kidney disease Active Problems:   Acute metabolic encephalopathy   Rhabdomyolysis   Hardware complicating wound infection   Type 2 diabetes mellitus with peripheral neuropathy (HCC)   Chronic obstructive pulmonary disease (COPD) (HCC)   History of DVT (deep vein thrombosis)   Normocytic  anemia   AKI on CKD stage IIIa: Baseline creatinine 1.4-1.6, noted with creatinine 2.65 trended up to 3.18. Continue IV fluid hydration with normal saline. Renal ultrasound 9/23 consistent with medical renal disease. UA shows proteinuria.   Rhabdomyolysis: CK up to 2788.  Could be secondary to daptomycin  which has since been discontinued.   Continue IV fluid hydration .  CK level trending down.   Acute metabolic encephalopathy: >Improved Initially admitted to [facility] with encephalopathy, mentation appears to be near baseline now.   This was thought secondary to AKI versus adverse effect of cefepime  which has since been discontinued.   Gabapentin  dose was decreased. Mental status seems improved. CT head ruled out acute stroke   Right tibial hardware associated infection: s/p hardware removal and skin graft substitute placement 10/15/2023: Cast and wound VAC remain in place.  Orthopedics [name] to manage wound while in hospital.   Previous antibiotics, daptomycin  and cefepime  were discontinued due to suspected adverse effects.   Continue IV ertapenem . Patient underwent wound VAC removal and change along leg splint at bedside yesterday. ID recommended Ertapenum for 4 weeks,followed by linezolid  2 weeks, then doxy 2 weeks. Nonweightbearing right lower extremity ,  maintain knee immobilizer. Follow-up orthopedics 7 to 10 days after discharge for wound check. Patient can return back to SNF once bed available.  Left lower extremity DVT: Acute distal LLE DVT on 10/23/2023.  Continue Eliquis .   Type 2 diabetes: Carb modified diet, continue SSI.   COPD: Stable, continue Trelegy Ellipta and albuterol  as needed.   Normocytic anemia: Hemoglobin stable at 8.3.   Likely multifactorial related to B12/iron  deficiency and anemia of chronic disease.   Continue B12 and  iron  supplement.   DVT prophylaxis: Eliquis  Code Status: Full code Family Communication: No family at bedside Disposition  Plan:    Status is: Inpatient Remains inpatient appropriate because: Severity of illness  Patient will likely be discharged to SNF tomorrow.    Consultants:  Orthopedics  Procedures:  Antimicrobials: Anti-infectives (From admission, onward)    Start     Dose/Rate Route Frequency Ordered Stop   11/13/23 1000  doxycycline  (VIBRA -TABS) tablet 100 mg        100 mg Oral Every 12 hours 11/02/23 1017 11/26/23 0959   10/30/23 1200  linezolid  (ZYVOX ) tablet 600 mg        600 mg Oral Every 12 hours 10/30/23 1101 11/13/23 0959   10/29/23 2200  ertapenem  (INVANZ ) 500 mg in sodium chloride  0.9 % 50 mL IVPB        500 mg 100 mL/hr over 30 Minutes Intravenous Every 24 hours 10/29/23 2026 11/25/23 2359       Subjective: Patient was seen and examined at bedside.  Overnight events noted. S/p wound VAC removal and Dressing change yesterday. Patient reports feeling much better.  He wants to be discharged to a different facility.   Objective: Vitals:   11/02/23 1348 11/02/23 2042 11/03/23 0537 11/03/23 0748  BP: 136/72  134/69 (!) 151/74  Pulse: 74 94 88 91  Resp: 16 (!) 21 15 10   Temp: 97.9 F (36.6 C)  97.9 F (36.6 C) 97.7 F (36.5 C)  TempSrc:   Oral   SpO2: 99% 95% 96% 100%    Intake/Output Summary (Last 24 hours) at 11/03/2023 1331 Last data filed at 11/03/2023 1036 Gross per 24 hour  Intake 480 ml  Output 850 ml  Net -370 ml   There were no vitals filed for this visit.  Examination:  General exam: Appears calm and comfortable , deconditioned, not in any acute distress. Respiratory system: CTA Bilaterally . Respiratory effort normal.  RR 14 Cardiovascular system: S1 & S2 heard, RRR. No JVD, murmurs, rubs, gallops or clicks.  Gastrointestinal system: Abdomen is non distended, soft and non tender. Normal bowel sounds heard. Central nervous system: Alert and oriented x 3. No focal neurological deficits. Extremities: RLE covered in dressing. Skin: No rashes, lesions or  ulcers Psychiatry: Judgement and insight appear normal. Mood & affect appropriate.    Data Reviewed: I have personally reviewed following labs and imaging studies  CBC: Recent Labs  Lab 10/30/23 0509 10/31/23 0500 11/01/23 0509 11/02/23 0118 11/03/23 0115  WBC 9.6 8.7 9.2 11.7* 11.2*  HGB 8.8* 8.7* 8.3* 8.7* 8.9*  HCT 29.0* 28.6* 27.3* 28.7* 29.4*  MCV 89.5 90.2 90.4 91.4 91.9  PLT 291 292 288 287 312   Basic Metabolic Panel: Recent Labs  Lab 10/29/23 0535 10/30/23 0509 10/31/23 0500 11/01/23 0509 11/02/23 0118  NA 138 136 136 135 136  K 4.4 4.2 4.5 4.2 4.5  CL 111 110 109 108 108  CO2 18* 19* 20* 21* 21*  GLUCOSE 96 121* 114* 129* 167*  BUN 50* 43* 41* 40* 40*  CREATININE 2.38* 2.48* 2.78* 3.18* 3.19*  CALCIUM 8.7* 8.6* 8.6* 8.2* 8.2*  MG  --   --  2.1 2.1 1.9  PHOS 3.0  --  4.1 4.0 4.5   GFR: Estimated Creatinine Clearance: 12.8 mL/min (A) (by C-G formula based on SCr of 3.19 mg/dL (H)). Liver Function Tests: Recent Labs  Lab 10/29/23 0535  ALBUMIN  2.2*   No results for input(s): LIPASE, AMYLASE in the last 168  hours. No results for input(s): AMMONIA in the last 168 hours. Coagulation Profile: No results for input(s): INR, PROTIME in the last 168 hours. Cardiac Enzymes: Recent Labs  Lab 10/28/23 0246 10/29/23 0535 10/30/23 0509 10/31/23 0500  CKTOTAL 1,699* 2,788* 2,145* 1,022*   BNP (last 3 results) No results for input(s): PROBNP in the last 8760 hours. HbA1C: No results for input(s): HGBA1C in the last 72 hours. CBG: Recent Labs  Lab 11/02/23 1127 11/02/23 1634 11/02/23 2129 11/03/23 0634 11/03/23 1154  GLUCAP 132* 160* 112* 108* 133*   Lipid Profile: No results for input(s): CHOL, HDL, LDLCALC, TRIG, CHOLHDL, LDLDIRECT in the last 72 hours. Thyroid Function Tests: No results for input(s): TSH, T4TOTAL, FREET4, T3FREE, THYROIDAB in the last 72 hours. Anemia Panel: No results for input(s):  VITAMINB12, FOLATE, FERRITIN, TIBC, IRON , RETICCTPCT in the last 72 hours. Sepsis Labs: Recent Labs  Lab 10/27/23 1615  LATICACIDVEN 1.3    Recent Results (from the past 240 hours)  Culture, blood (routine x 2)     Status: None   Collection Time: 10/27/23 10:19 PM   Specimen: BLOOD  Result Value Ref Range Status   Specimen Description BLOOD LEFT ARM  Final   Special Requests   Final    BOTTLES DRAWN AEROBIC AND ANAEROBIC Blood Culture adequate volume   Culture   Final    NO GROWTH 5 DAYS Performed at [facility], [address], [place], [place] [address]    Report Status 11/01/2023 FINAL  Final  Culture, blood (routine x 2)     Status: None   Collection Time: 10/27/23 10:20 PM   Specimen: BLOOD RIGHT ARM  Result Value Ref Range Status   Specimen Description BLOOD RIGHT ARM  Final   Special Requests   Final    BOTTLES DRAWN AEROBIC AND ANAEROBIC Blood Culture adequate volume   Culture   Final    NO GROWTH 5 DAYS Performed at [facility], [address], [place], [place] [address]    Report Status 11/01/2023 FINAL  Final    Radiology Studies: No results found.  Scheduled Meds:  apixaban   5 mg Oral BID   budesonide -glycopyrrolate -formoterol   2 puff Inhalation BID   Chlorhexidine  Gluconate Cloth  6 each Topical Daily   cyanocobalamin   1,000 mcg Oral Daily   [START ON 11/13/2023] doxycycline   100 mg Oral Q12H   feeding supplement (GLUCERNA SHAKE)  237 mL Oral TID BM   ferrous sulfate   325 mg Oral Q breakfast   gabapentin   100 mg Oral QHS   Gerhardt's butt cream   Topical BID   insulin  aspart  0-5 Units Subcutaneous QHS   insulin  aspart  0-9 Units Subcutaneous TID WC   linezolid   600 mg Oral Q12H   polyethylene glycol  17 g Oral Daily   sodium chloride  flush  10-40 mL Intracatheter Q12H   Continuous Infusions:  ertapenem  500 mg (11/02/23 2353)     LOS: 5 days    Time spent: 35 mins    Darcel Dawley, MD [facility]   If 7PM-7AM, please contact night-coverage

## 2023-11-03 NOTE — TOC Progression Note (Addendum)
 Transition of Care [facility]) - Progression Note    Patient Details  Name: Joe Coleman MRN: [id number] Date of Birth: 15-Oct-1945  [facility] CM/SW Contact  Bridget Cordella Simmonds, LCSW Phone Number: 11/03/2023, 8:38 AM  Clinical Narrative:   SNF auth request remains pending in [place].   1130: Pt on IV abx, CSW informed SNF of this, they will need to confirm with admin that they can do IV abx.  SNF auth approved: [id number], 3 days: 9/29-10/1.  1215: message form [facility]: they are good to receive pt today, all set with IV abx.   MD informed.   Expected Discharge Plan: Skilled Nursing Facility Barriers to Discharge: Continued Medical Work up, SNF Pending bed offer               Expected Discharge Plan and Services   Discharge Planning Services: CM Consult Post Acute Care Choice: Skilled Nursing Facility Living arrangements for the past 2 months: Single Family Home                                       Social Drivers of Health (SDOH) Interventions SDOH Screenings   Food Insecurity: No Food Insecurity (10/29/2023)  Housing: Low Risk  (10/29/2023)  Transportation Needs: No Transportation Needs (10/29/2023)  Utilities: Not At Risk (10/29/2023)  Financial Resource Strain: Medium Risk (05/02/2021)   Received from [facility]  Physical Activity: Inactive (08/25/2018)   Received from [facility]  Social Connections: Unknown (10/29/2023)  Stress: No Stress Concern Present (08/25/2018)   Received from [facility]  Tobacco Use: Medium Risk (10/27/2023)  Health Literacy: High Risk (05/04/2022)   Received from [facility]    Readmission Risk Interventions    09/01/2023   12:00 PM  Readmission Risk Prevention Plan  Transportation Screening Complete  Medication Review (RN Care Manager) Complete  PCP or Specialist appointment within 3-5 days of discharge Complete  HRI or Home Care Consult Complete  SW Recovery Care/Counseling Consult  Complete  Palliative Care Screening Not Applicable  Skilled Nursing Facility Complete

## 2023-11-04 ENCOUNTER — Inpatient Hospital Stay: Payer: Self-pay | Admitting: Internal Medicine

## 2023-11-04 LAB — CBC
HCT: 28.1 % — ABNORMAL LOW (ref 39.0–52.0)
Hemoglobin: 8.5 g/dL — ABNORMAL LOW (ref 13.0–17.0)
MCH: 28 pg (ref 26.0–34.0)
MCHC: 30.2 g/dL (ref 30.0–36.0)
MCV: 92.4 fL (ref 80.0–100.0)
Platelets: 279 K/uL (ref 150–400)
RBC: 3.04 MIL/uL — ABNORMAL LOW (ref 4.22–5.81)
RDW: 16.8 % — ABNORMAL HIGH (ref 11.5–15.5)
WBC: 11.1 K/uL — ABNORMAL HIGH (ref 4.0–10.5)
nRBC: 0 % (ref 0.0–0.2)

## 2023-11-04 LAB — BASIC METABOLIC PANEL WITH GFR
Anion gap: 10 (ref 5–15)
BUN: 30 mg/dL — ABNORMAL HIGH (ref 8–23)
CO2: 21 mmol/L — ABNORMAL LOW (ref 22–32)
Calcium: 8.3 mg/dL — ABNORMAL LOW (ref 8.9–10.3)
Chloride: 105 mmol/L (ref 98–111)
Creatinine, Ser: 2.93 mg/dL — ABNORMAL HIGH (ref 0.61–1.24)
GFR, Estimated: 21 mL/min — ABNORMAL LOW (ref >60)
Glucose, Bld: 131 mg/dL — ABNORMAL HIGH (ref 70–99)
Potassium: 4.5 mmol/L (ref 3.5–5.1)
Sodium: 136 mmol/L (ref 135–145)

## 2023-11-04 LAB — GLUCOSE, CAPILLARY
Glucose-Capillary: 118 mg/dL — ABNORMAL HIGH (ref 70–99)
Glucose-Capillary: 98 mg/dL (ref 70–99)
Glucose-Capillary: 116 mg/dL — ABNORMAL HIGH (ref 70–99)

## 2023-11-04 MED ORDER — ERTAPENEM IV (FOR PTA / DISCHARGE USE ONLY): 500.0000 mg | INTRAVENOUS | 0 refills | Status: AC

## 2023-11-04 MED ORDER — DOXYCYCLINE HYCLATE 100 MG PO TABS: 100.0000 mg | ORAL_TABLET | Freq: Two times a day (BID) | ORAL | 0 refills | Status: DC

## 2023-11-04 MED ORDER — GERHARDT'S BUTT CREAM: 1.0000 | TOPICAL_CREAM | Freq: Two times a day (BID) | CUTANEOUS | 0 refills | Status: AC

## 2023-11-04 MED ORDER — HYDROCODONE-ACETAMINOPHEN 5-325 MG PO TABS: 1.0000 | ORAL_TABLET | ORAL | 0 refills | Status: AC | PRN

## 2023-11-04 MED ORDER — HYDROCODONE-ACETAMINOPHEN 5-325 MG PO TABS: 1.0000 | ORAL_TABLET | ORAL | 0 refills | Status: DC | PRN

## 2023-11-04 MED ORDER — LINEZOLID 600 MG PO TABS: 600.0000 mg | ORAL_TABLET | Freq: Two times a day (BID) | ORAL | 0 refills | Status: AC

## 2023-11-04 NOTE — Progress Notes (Signed)
 Discharge/necessary documents provided to [facility] . Pt d/c to [facility] as ordered. NO complaints. Pt d/c with knee immobilizer attached to RLE after wound dressing changed.  He remains alert/oriented in no apparent distress.

## 2023-11-04 NOTE — Progress Notes (Signed)
 Report given to [place] [facility] nurse at [facility]. All questions and concerns were fully answered.

## 2023-11-04 NOTE — Discharge Instructions (Signed)
 Advised to follow-up with primary care physician in 1 week. Advised to follow-up with infectious diseases as scheduled. Advised to continue follow-up with orthopedics as scheduled. Advised to continue ertapenem  500 mg daily for 25 days for osteomyelitis Continue linezolid  and doxycycline  as per recommendation.

## 2023-11-04 NOTE — Plan of Care (Signed)
   Problem: Education: Goal: Knowledge of General Education information will improve Description: Including pain rating scale, medication(s)/side effects and non-pharmacologic comfort measures Outcome: Progressing   Problem: Activity: Goal: Risk for activity intolerance will decrease Outcome: Progressing   Problem: Skin Integrity: Goal: Risk for impaired skin integrity will decrease Outcome: Progressing

## 2023-11-04 NOTE — Discharge Summary (Addendum)
 Physician Discharge Summary  Joe Coleman FMW:[id number] DOB: 1945/05/28 DOA: 10/29/2023  PCP: [facility]  Admit date: 10/29/2023  Discharge date: 11/04/2023  Admitted From: Home  Disposition:  Skilled Nursing Facility  Recommendations for Outpatient Follow-up:  Follow up with PCP in 1-2 weeks. Please obtain BMP/CBC in one week. Advised to follow-up with infectious diseases as scheduled. Advised to continue follow-up with orthopedics as scheduled. Advised to continue ertapenem  500 mg IV daily for 25 days for osteomyelitis. LDOT 11/25/23 Continue linezolid   for 8 more days. Continue doxycycline  until follow up with ID.  Home Health: None Equipment/Devices:None  Discharge Condition: Stable CODE STATUS:Full code Diet recommendation: Heart Healthy  Brief [facility] Course: This 78 yrs old male with medical history significant for COPD, CKD stage IIIa, T2DM, HTN, LLE DVT on Eliquis , right tibial fracture s/p ORIF 08/25/2023 w/ subsequent wound infection s/p hardware removal 10/15/2023 who was admitted at [facility] with altered mental status and rhabdomyolysis and transferred to [facility] for orthopedic management of his wound.    Patient was recently admitted 10/14/2023-10/24/2023 for postoperative right tibial hardware associated infection.  He underwent hardware removal, I&D right proximal tibia, skin graft, long-leg splint, and wound VAC placement by [name] on 10/15/2023.  Cultures grew out Enterobacter cloacae and Klebsiella pneumoniae.  Patient was placed on 6 weeks of IV antibiotics with daptomycin  and cefepime .  He was also noted to have LLE DVT and was started on Eliquis .  PT/OT recommended SNF but patient elected to be discharged to home    Patient was found to have altered mental status, AKI, rhabdomyolysis.  Altered mental status felt potentially secondary to infection versus side effects of her cefepime  in the setting of renal dysfunction.  Antibiotics changed  to IV ertapenem  as per ID recommendation.  Patient was evaluated by orthopedics [name].  CT head negative for acute intracranial abnormalities.  Renal ultrasound consistent medical renal disease. Patient underwent wound VAC removal and change along leg splint at bedside.  Ortho recommended continue IV antibiotics as per ID recommendation.  Continue ertapenem  500 mg IV daily for 25 days,  last day of treatment 11/25/2023.  Continue linezolid  for 2 weeks last day of linezolid  11/12/2023.  Continue doxycycline  until follow-up with ID.  PT and OT recommended skilled nursing facility.  Insurance authorization approved.  Patient being discharged to SNF for rehab.   Discharge Diagnoses:  Principal Problem:   Acute kidney injury superimposed on chronic kidney disease Active Problems:   Acute metabolic encephalopathy   Rhabdomyolysis   Hardware complicating wound infection   Type 2 diabetes mellitus with peripheral neuropathy (HCC)   Chronic obstructive pulmonary disease (COPD) (HCC)   History of DVT (deep vein thrombosis)   Normocytic anemia  AKI on CKD stage IIIa: Baseline creatinine 1.4-1.6, noted with creatinine 2.65 trended up to 3.18. Continue IV fluid hydration with normal saline. Renal ultrasound 9/23 consistent with medical renal disease. UA shows proteinuria.  Functions improving. Recheck BMP in 3 to 4 days.   Rhabdomyolysis: > Resolved. CK up to 2788.  Could be secondary to daptomycin  which has since been discontinued.   Continue IV fluid hydration .CK Normalized.   Acute metabolic encephalopathy: >Improved Initially admitted to [facility] with encephalopathy, mentation appears to be near baseline now.   This was thought secondary to AKI versus adverse effect of cefepime  which has since been discontinued.   Gabapentin  dose was decreased. Mental status seems improved. CT head ruled out acute stroke.   Right tibial  hardware associated infection: s/p hardware removal and skin graft  substitute placement 10/15/2023: Cast and wound VAC remain in place.  Orthopedics [name] to manage wound while in hospital.   Previous antibiotics, daptomycin  and cefepime  were discontinued due to suspected adverse effects.   Continue IV ertapenem . Patient underwent wound VAC removal and change along leg splint at bedside . ID recommended Ertapenum for 4 weeks, linezolid  2 weeks, then doxy 2 weeks. Nonweightbearing right lower extremity ,  maintain knee immobilizer. Follow-up orthopedics 7 to 10 days after discharge for wound check. Patient can return back to SNF once bed available.   Left lower extremity DVT: Acute distal LLE DVT on 10/23/2023.  Continue Eliquis .   Type 2 diabetes: Carb modified diet, continue SSI.   COPD: Stable, continue Trelegy Ellipta and albuterol  as needed.   Normocytic anemia: Hemoglobin stable at 8.3.   Likely multifactorial related to B12/iron  deficiency and anemia of chronic disease.   Continue B12 and iron  supplement.  Discharge Instructions  Discharge Instructions     Call MD for:  difficulty breathing, headache or visual disturbances   Complete by: As directed    Call MD for:  persistant dizziness or light-headedness   Complete by: As directed    Call MD for:  persistant nausea and vomiting   Complete by: As directed    Diet - low sodium heart healthy   Complete by: As directed    Diet general   Complete by: As directed    Discharge instructions   Complete by: As directed    Advised to follow-up with primary care physician in 1 week. Advised to follow-up with infectious diseases as scheduled. Advised to continue follow-up with orthopedics as scheduled. Advised to continue ertapenem  500 mg daily for 25 days for osteomyelitis Continue linezolid  and doxycycline  as per recommendation.   Discharge wound care:   Complete by: As directed    Follow-up wound care at nursing home.   Home infusion instructions   Complete by: As directed     Instructions: Flushing of vascular access device: 0.9% NaCl pre/post medication administration and prn patency; Heparin  100 u/ml, 5ml for implanted ports and Heparin  10u/ml, 5ml for all other central venous catheters.   Increase activity slowly   Complete by: As directed       Allergies as of 11/04/2023       Reactions   Losartan Other (See Comments)   Hyperkalemia (>5.5 multiple times on losartan 25mg )   Chicken Allergy Nausea And Vomiting      Poultry Meal Nausea And Vomiting        Medication List     STOP taking these medications    ceFEPime  IVPB Commonly known as: MAXIPIME    daptomycin  IVPB Commonly known as: CUBICIN    ertapenem  500 mg in sodium chloride  0.9 % 50 mL   metFORMIN  500 MG tablet Commonly known as: GLUCOPHAGE    oxyCODONE -acetaminophen  5-325 MG tablet Commonly known as: Percocet       TAKE these medications    acetaminophen  325 MG tablet Commonly known as: TYLENOL  Take 2 tablets (650 mg total) by mouth every 6 (six) hours as needed for mild pain (pain score 1-3) or fever (or Fever >/= 101).   albuterol  108 (90 Base) MCG/ACT inhaler Commonly known as: VENTOLIN  HFA Inhale 2 puffs into the lungs every 6 (six) hours as needed for wheezing or shortness of breath.   apixaban  5 MG Tabs tablet Commonly known as: ELIQUIS  Take 2 tablets (10 mg total) by  mouth 2 (two) times daily for 1 day, THEN 1 tablet (5 mg total) 2 (two) times daily. Start taking on: October 29, 2023   bisacodyl  5 MG EC tablet Commonly known as: DULCOLAX Take 1 tablet (5 mg total) by mouth daily as needed for moderate constipation.   Chlorhexidine  Gluconate Cloth 2 % Pads Apply 6 each topically daily.   cyanocobalamin  1000 MCG tablet Commonly known as: VITAMIN B12 Take 1 tablet (1,000 mcg total) by mouth daily.   doxycycline  100 MG tablet Commonly known as: VIBRA -TABS Take 1 tablet (100 mg total) by mouth every 12 (twelve) hours for 13 days. Start taking on: November 13, 2023   ertapenem  IVPB Commonly known as: INVANZ  Inject 500 mg into the vein daily for 25 days. 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   ferrous sulfate  325 (65 FE) MG tablet Take 1 tablet (325 mg total) by mouth daily with breakfast.   gabapentin  300 MG capsule Commonly known as: NEURONTIN  Take 300 mg by mouth at bedtime.   gabapentin  100 MG capsule Commonly known as: NEURONTIN  Take 1 capsule (100 mg total) by mouth at bedtime.   Gerhardt's butt cream Crea Apply 1 Application topically 2 (two) times daily for 10 days.   guaiFENesin  600 MG 12 hr tablet Commonly known as: Mucinex  Take 1 tablet (600 mg total) by mouth 2 (two) times daily.   HYDROcodone -acetaminophen  5-325 MG tablet Commonly known as: NORCO/VICODIN Take 1-2 tablets by mouth every 4 (four) hours as needed for up to 3 days for moderate pain (pain score 4-6) or severe pain (pain score 7-10).   linezolid  600 MG tablet Commonly known as: ZYVOX  Take 1 tablet (600 mg total) by mouth every 12 (twelve) hours for 8 days.   methocarbamol  500 MG tablet Commonly known as: ROBAXIN  Take 1 tablet (500 mg total) by mouth every 6 (six) hours as needed for muscle spasms.   ondansetron  4 MG tablet Commonly known as: ZOFRAN  Take 1 tablet (4 mg total) by mouth every 6 (six) hours as needed for nausea.   polyethylene glycol 17 g packet Commonly known as: MIRALAX  / GLYCOLAX  Take 17 g by mouth daily.   sodium chloride  flush 0.9 % Soln Commonly known as: NS 10-40 mLs by Intracatheter route every 12 (twelve) hours.   Trelegy Ellipta 200-62.5-25 MCG/ACT Aepb Generic drug: Fluticasone-Umeclidin-Vilant Inhale 1 puff into the lungs daily.   vitamin D3 25 MCG tablet Commonly known as:  CHOLECALCIFEROL  Take 1 tablet (1,000 Units total) by mouth daily.               Home Infusion Instuctions  (From admission, onward)           Start     Ordered   11/04/23 0000  Home infusion instructions       Question:  Instructions  Answer:  Flushing of vascular access device: 0.9% NaCl pre/post medication administration and prn patency; Heparin  100 u/ml, 5ml for implanted ports and Heparin  10u/ml, 5ml for all other central venous catheters.   11/04/23 1122              Discharge Care Instructions  (From admission, onward)           Start  Ordered   11/04/23 0000  Discharge wound care:       Comments: Follow-up wound care at nursing home.   11/04/23 1124            Contact information for follow-up providers     Haddix, Franky SQUIBB, MD. Schedule an appointment as soon as possible for a visit.   Specialty: Orthopedic Surgery Why: 7-10 day after discharge for wound check Contact information: [address] [place] [address] [phone]         [facility] Follow up in 1 week(s).   Contact information: [address] [place] [address] [phone]         Overton Constance DASEN, MD Follow up in 3 week(s).   Specialty: Infectious Diseases Contact information: [address] [place] [address] [phone]              Contact information for after-discharge care     Destination     [place] [facility] [place], [place] .   Service: Skilled Nursing Contact information: [address] [place]  [address] [phone]                    Allergies  Allergen Reactions   Losartan Other (See Comments)    Hyperkalemia (>5.5 multiple times on losartan 25mg )   Chicken Allergy Nausea And Vomiting        Poultry Meal Nausea And Vomiting    Consultations: [facility] ID   Procedures/Studies: DG Tibia/Fibula Right Result Date: 10/30/2023 CLINICAL DATA:  Fracture  follow-up. EXAM: RIGHT TIBIA AND FIBULA - 2 VIEW COMPARISON:  Radiograph 10/15/2023 FINDINGS: Redemonstrated comminuted tibial shaft fracture. Component likely extends to the tibial component of knee arthroplasty. Alignment is grossly unchanged from prior. Ghost tracks are again seen within the tibial shaft. Unchanged alignment of displaced proximal fibular fracture. No significant bridging callus formation about the fractures. Splint material is in place. Wound VAC anteriorly. IMPRESSION: 1. Unchanged alignment of comminuted tibial shaft fracture. Component likely extends to the tibial component of knee arthroplasty. 2. Unchanged alignment of displaced proximal fibular fracture. Electronically Signed   By: Andrea Gasman M.D.   On: 10/30/2023 15:33   CT HEAD WO CONTRAST ( ) Result Date: 10/29/2023 CLINICAL DATA:  Mental status change, unknown cause. Acute onset of generalized weakness. Fever and chills. EXAM: CT HEAD WITHOUT CONTRAST TECHNIQUE: Contiguous axial images were obtained from the base of the skull through the vertex without intravenous contrast. RADIATION DOSE REDUCTION: This exam was performed according to the departmental dose-optimization program which includes automated exposure control, adjustment of the mA and/or kV according to patient size and/or use of iterative reconstruction technique. COMPARISON:  Head CT and MRI 04/24/2023 FINDINGS: Brain: There is no evidence of an acute infarct, intracranial hemorrhage, mass, midline shift, or extra-axial fluid collection. There is mild cerebral atrophy. Cerebral white matter hypodensities are similar to the prior CT and are nonspecific but compatible with minimal chronic small vessel ischemic disease. Vascular: Calcified atherosclerosis at the skull base. No hyperdense vessel. Skull: No acute fracture or suspicious lesion. Sinuses/Orbits: Mild mucosal thickening in the included portions of the paranasal sinuses. Left mastoidectomy. Postoperative  changes to the globes. Other: None. IMPRESSION: No evidence of acute intracranial abnormality. Electronically Signed   By: Dasie Hamburg M.D.   On: 10/29/2023 16:04   US  RENAL Result Date: 10/28/2023 CLINICAL DATA:  Acute kidney injury EXAM: RENAL / URINARY TRACT ULTRASOUND COMPLETE COMPARISON:  Renal ultrasound  examination dated 07/29/2023 FINDINGS: Right Kidney: Length = 10.4 cm Diffusely increased cortical echogenicity with preserved corticomedullary differentiation. Again seen is renal cortical thinning. No urinary tract dilation or shadowing calculi. The ureter is not seen. Left Kidney: Length = 10.4 cm Diffusely increased cortical echogenicity with preserved corticomedullary differentiation. Again seen is renal cortical thinning. Multiple cortical cysts measuring up to 1.0 cm in the interpolar kidney. No urinary tract dilation or shadowing calculi. The ureter is not seen. Bladder: Appears normal for degree of bladder distention. Other: None. IMPRESSION: 1. No urinary tract dilation or shadowing calculi. 2. Diffusely increased cortical echogenicity with preserved corticomedullary differentiation, consistent with medical renal disease. 3. Left renal cysts measuring up to 1.0 cm. Electronically Signed   By: Limin  Xu M.D.   On: 10/28/2023 18:52   VAS US  LOWER EXTREMITY VENOUS (DVT) Result Date: 10/23/2023  Lower Venous DVT Study Patient Name:  Joe Coleman  Date of Exam:   10/23/2023 Medical Rec #: [id number]        Accession #:    [id number] Date of Birth: April 12, 1945        Patient Gender: M Patient Age:   79 years Exam Location:  [facility] Procedure:      VAS US  LOWER EXTREMITY VENOUS (DVT) Referring Phys: PRANAV PATEL --------------------------------------------------------------------------------  Indications: Left calf pain. Status post right tibial fracture 08/20/23, with ORIF 08/25/23. Readmitted for hardware removal secondary to infection, 10/15/23.  Limitations: Bandages and Wound vac on  right leg from distal thigh over foot. Comparison Study: No prior study on file Performing Technologist: Alberta Lis RVS  Examination Guidelines: A complete evaluation includes B-mode imaging, spectral Doppler, color Doppler, and power Doppler as needed of all accessible portions of each vessel. Bilateral testing is considered an integral part of a complete examination. Limited examinations for reoccurring indications may be performed as noted. The reflux portion of the exam is performed with the patient in reverse Trendelenburg.  +---------+---------------+---------+-----------+----------+--------------+ RIGHT    CompressibilityPhasicitySpontaneityPropertiesThrombus Aging +---------+---------------+---------+-----------+----------+--------------+ CFV      Full           Yes      Yes                                 +---------+---------------+---------+-----------+----------+--------------+ SFJ      Full                                                        +---------+---------------+---------+-----------+----------+--------------+ FV Prox  Full                                                        +---------+---------------+---------+-----------+----------+--------------+ FV Mid   Full                                                        +---------+---------------+---------+-----------+----------+--------------+ FV DistalFull                                                        +---------+---------------+---------+-----------+----------+--------------+  PFV      Full                                                        +---------+---------------+---------+-----------+----------+--------------+   +---------+---------------+---------+-----------+----------+--------------+ LEFT     CompressibilityPhasicitySpontaneityPropertiesThrombus Aging +---------+---------------+---------+-----------+----------+--------------+ CFV      Full           Yes       Yes                                 +---------+---------------+---------+-----------+----------+--------------+ SFJ      Full                                                        +---------+---------------+---------+-----------+----------+--------------+ FV Prox  Full                                                        +---------+---------------+---------+-----------+----------+--------------+ FV Mid   Full                                                        +---------+---------------+---------+-----------+----------+--------------+ FV DistalFull           Yes      Yes                                 +---------+---------------+---------+-----------+----------+--------------+ PFV      Full                                                        +---------+---------------+---------+-----------+----------+--------------+ POP      Full           Yes      Yes                                 +---------+---------------+---------+-----------+----------+--------------+ PTV      Partial                                      Acute          +---------+---------------+---------+-----------+----------+--------------+ PERO     None                                         Acute          +---------+---------------+---------+-----------+----------+--------------+  Summary: RIGHT: - There is no evidence of deep vein thrombosis in the lower extremity. However, portions of this examination were limited- see technologist comments above.  LEFT: - Findings consistent with acute deep vein thrombosis involving one of the paired left posterior tibial veins, and both of the left peroneal veins.  - No cystic structure found in the popliteal fossa.  *See table(s) above for measurements and observations. Electronically signed by Debby Robertson on 10/23/2023 at 4:06:19 PM.    Final    DG CHEST PORT 1 VIEW Result Date: 10/22/2023 EXAM: 1 VIEW XRAY OF THE CHEST 10/22/2023 11:35:00  AM COMPARISON: 10/17/2023 CLINICAL HISTORY: SOB (shortness of breath) FINDINGS: LINES, TUBES AND DEVICES: Left PICC with tip in distal SVC. Surgical clips at GE junction. LUNGS AND PLEURA: Blunting of left costophrenic angle without definite pleural effusion. Stable left basilar opacities. HEART AND MEDIASTINUM: No acute abnormality of the cardiac and mediastinal silhouettes. BONES AND SOFT TISSUES: Thoracic spondylosis. IMPRESSION: 1. Interval placement of left PICC with tip over the distal SVC. 2. Stable left basilar opacities, favor atelectasis versus scarring. Electronically signed by: Donnice Mania MD 10/22/2023 12:50 PM EDT RP Workstation: [id number]   DG CHEST PORT 1 VIEW Result Date: 10/17/2023 CLINICAL DATA:  Cough EXAM: PORTABLE CHEST 1 VIEW COMPARISON:  August 31, 2023 chest radiograph and prior studies FINDINGS: Similar appearance of streaky bibasilar opacities, likely representing atelectasis and or scarring. No new consolidation. No pleural effusions. No pneumothorax. Unchanged cardiomediastinal silhouette. IMPRESSION: Likely bibasilar scarring/atelectasis. Electronically Signed   By: Michaeline Blanch M.D.   On: 10/17/2023 11:44   VAS US  ABI WITH/WO TBI Result Date: 10/17/2023  LOWER EXTREMITY DOPPLER STUDY Patient Name:  Joe Coleman  Date of Exam:   10/16/2023 Medical Rec #: [id number]        Accession #:    [id number] Date of Birth: 1945/07/12        Patient Gender: M Patient Age:   37 years Exam Location:  [facility] Procedure:      VAS US  ABI WITH/WO TBI Referring Phys: EKTA PATEL --------------------------------------------------------------------------------  Indications: Decreased pedal pulses. High Risk Factors: Hypertension, Diabetes, past history of smoking.  Limitations: Today's exam was limited due to an open wound, bandages and post-op              cast, unable to compress. Comparison Study: No prior exam. Performing Technologist: Edilia Elden Appl  Examination Guidelines: A  complete evaluation includes at minimum, Doppler waveform signals and systolic blood pressure reading at the level of bilateral brachial, anterior tibial, and posterior tibial arteries, when vessel segments are accessible. Bilateral testing is considered an integral part of a complete examination. Photoelectric Plethysmograph (PPG) waveforms and toe systolic pressure readings are included as required and additional duplex testing as needed. Limited examinations for reoccurring indications may be performed as noted.  ABI Findings: +---------+------------------+-----+---------+-------------+ Right    Rt Pressure (mmHg)IndexWaveform Comment       +---------+------------------+-----+---------+-------------+ Brachial 129                    triphasic              +---------+------------------+-----+---------+-------------+ PTA                                      Not obtained. +---------+------------------+-----+---------+-------------+ DP  triphasic              +---------+------------------+-----+---------+-------------+ Great Toe70                0.54 Normal                 +---------+------------------+-----+---------+-------------+ +---------+------------------+-----+---------+-------+ Left     Lt Pressure (mmHg)IndexWaveform Comment +---------+------------------+-----+---------+-------+ Brachial 127                    triphasic        +---------+------------------+-----+---------+-------+ PTA      144               1.12 triphasic        +---------+------------------+-----+---------+-------+ DP       133               1.03 triphasic        +---------+------------------+-----+---------+-------+ Great Toe101               0.78 Normal           +---------+------------------+-----+---------+-------+ Unable to obtain right lower extremity pressures due to patient's post-op cast. Waveform dopplers of the dorsalis pedis visualized.   Summary: Right: The right toe-brachial index is abnormal.  Unable to obtain pressures. Left: Resting left ankle-brachial index is within normal range. The left toe-brachial index is normal.  *See table(s) above for measurements and observations.  Electronically signed by Debby Robertson on 10/17/2023 at 7:17:31 AM.    Final    US  EKG SITE RITE Result Date: 10/17/2023 If Site Rite image not attached, placement could not be confirmed due to current cardiac rhythm.  DG Tibia/Fibula Right Port Result Date: 10/15/2023 CLINICAL DATA:  Left tibial and fibular fractures. EXAM: PORTABLE RIGHT TIBIA AND FIBULA - 2 VIEW COMPARISON:  October 14, 2023.  August 25, 2023. FINDINGS: The right lower leg has been casted and immobilized status post surgical removal of fixation hardware. Mildly to moderately displaced and comminuted fracture is seen involving the proximal right tibia, with mildly displaced proximal right fibula fracture. IMPRESSION: Casted and immobilized right lower leg status post surgical removal of fixation hardware. Electronically Signed   By: Lynwood Landy Raddle M.D.   On: 10/15/2023 13:37   DG Tibia/Fibula Right Result Date: 10/15/2023 CLINICAL DATA:  Hardware removal. EXAM: DG TIBIA/FIBULA 2V*R*; DG C-ARM 1-60 MIN-NO REPORT Radiation exposure index: 1.02 mGy. COMPARISON:  October 14, 2023. FINDINGS: Five intraoperative fluoroscopic images were obtained of the right lower leg. These images demonstrate interval removal of surgical internal fixation hardware involving proximal tibia. Mildly displaced oblique fracture of proximal tibia is again noted. Status post right total knee arthroplasty. IMPRESSION: Fluoroscopic guidance provided during surgical hardware removal. Electronically Signed   By: Lynwood Landy Raddle M.D.   On: 10/15/2023 13:35   DG C-Arm 1-60 Min-No Report Result Date: 10/15/2023 CLINICAL DATA:  Hardware removal. EXAM: DG TIBIA/FIBULA 2V*R*; DG C-ARM 1-60 MIN-NO REPORT Radiation exposure index:  1.02 mGy. COMPARISON:  October 14, 2023. FINDINGS: Five intraoperative fluoroscopic images were obtained of the right lower leg. These images demonstrate interval removal of surgical internal fixation hardware involving proximal tibia. Mildly displaced oblique fracture of proximal tibia is again noted. Status post right total knee arthroplasty. IMPRESSION: Fluoroscopic guidance provided during surgical hardware removal. Electronically Signed   By: Lynwood Landy Raddle M.D.   On: 10/15/2023 13:35   DG C-Arm 1-60 Min-No Report Result Date: 10/15/2023 CLINICAL DATA:  Hardware removal. EXAM: DG TIBIA/FIBULA 2V*R*; DG C-ARM 1-60  MIN-NO REPORT Radiation exposure index: 1.02 mGy. COMPARISON:  October 14, 2023. FINDINGS: Five intraoperative fluoroscopic images were obtained of the right lower leg. These images demonstrate interval removal of surgical internal fixation hardware involving proximal tibia. Mildly displaced oblique fracture of proximal tibia is again noted. Status post right total knee arthroplasty. IMPRESSION: Fluoroscopic guidance provided during surgical hardware removal. Electronically Signed   By: Lynwood Landy Raddle M.D.   On: 10/15/2023 13:35   DG Knee 2 Views Right Result Date: 10/14/2023 CLINICAL DATA:  wound, visible surg hardware EXAM: RIGHT KNEE - 1-2 VIEW COMPARISON:  08/25/2023. FINDINGS: Previously seen comminuted tibial fractures and proximal fibular fractures are again seen. No acute fracture or dislocation. No aggressive osseous lesion. 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 The hardware is intact. No periprosthetic fracture or lucency. There is near anatomic alignment. No knee effusion or focal soft tissue swelling. No radiopaque foreign bodies. IMPRESSION: No acute osseous abnormality of the right knee. Electronically Signed   By: Ree Molt M.D.   On: 10/14/2023 15:23    Subjective: Patient was seen and examined at bedside.  Overnight events  noted. Patient reports feeling much improved and wants to be discharged.  Discharge Exam: Vitals:   11/04/23 0811 11/04/23 0813  BP:  127/76  Pulse: 80 83  Resp: 16 19  Temp:  98.2 F (36.8 C)  SpO2:  98%   Vitals:   11/03/23 2132 11/04/23 0255 11/04/23 0811 11/04/23 0813  BP: (!) 162/81 132/72  127/76  Pulse: 88 88 80 83  Resp: 16 16 16 19   Temp: 98.1 F (36.7 C) 98.3 F (36.8 C)  98.2 F (36.8 C)  TempSrc: Oral Oral    SpO2: 99% 97%  98%    General: Pt is alert, awake, not in acute distress Cardiovascular: RRR, S1/S2 +, no rubs, no gallops Respiratory: CTA bilaterally, no wheezing, no rhonchi Abdominal: Soft, NT, ND, bowel sounds + Extremities: no edema, no cyanosis    The results of significant diagnostics from this hospitalization (including imaging, microbiology, ancillary and laboratory) are listed below for reference.     Microbiology: Recent Results (from the past 240 hours)  Culture, blood (routine x 2)     Status: None   Collection Time: 10/27/23 10:19 PM   Specimen: BLOOD  Result Value Ref Range Status   Specimen Description BLOOD LEFT ARM  Final   Special Requests   Final    BOTTLES DRAWN AEROBIC AND ANAEROBIC Blood Culture adequate volume   Culture   Final    NO GROWTH 5 DAYS Performed at [facility], [address], [place], [place] [address]    Report Status 11/01/2023 FINAL  Final  Culture, blood (routine x 2)     Status: None   Collection Time: 10/27/23 10:20 PM   Specimen: BLOOD RIGHT ARM  Result Value Ref Range Status   Specimen Description BLOOD RIGHT ARM  Final   Special Requests   Final    BOTTLES DRAWN AEROBIC AND ANAEROBIC Blood Culture adequate volume   Culture   Final    NO GROWTH 5 DAYS Performed at [facility], [address], [place], [place] [address]    Report Status 11/01/2023 FINAL  Final     Labs: BNP (last 3 results) No results for input(s): BNP in the last 8760 hours. Basic Metabolic  Panel: Recent Labs  Lab 10/29/23 0535 10/30/23 0509 10/31/23 0500 11/01/23 0509 11/02/23 0118 11/03/23 1256 11/04/23 0949  NA  138   < > 136 135 136 134* 136  K 4.4   < > 4.5 4.2 4.5 4.5 4.5  CL 111   < > 109 108 108 104 105  CO2 18*   < > 20* 21* 21* 20* 21*  GLUCOSE 96   < > 114* 129* 167* 188* 131*  BUN 50*   < > 41* 40* 40* 32* 30*  CREATININE 2.38*   < > 2.78* 3.18* 3.19* 3.26* 2.93*  CALCIUM 8.7*   < > 8.6* 8.2* 8.2* 8.1* 8.3*  MG  --   --  2.1 2.1 1.9  --   --   PHOS 3.0  --  4.1 4.0 4.5  --   --    < > = values in this interval not displayed.   Liver Function Tests: Recent Labs  Lab 10/29/23 0535  ALBUMIN  2.2*   No results for input(s): LIPASE, AMYLASE in the last 168 hours. No results for input(s): AMMONIA in the last 168 hours. CBC: Recent Labs  Lab 10/31/23 0500 11/01/23 0509 11/02/23 0118 11/03/23 0115 11/04/23 0949  WBC 8.7 9.2 11.7* 11.2* 11.1*  HGB 8.7* 8.3* 8.7* 8.9* 8.5*  HCT 28.6* 27.3* 28.7* 29.4* 28.1*  MCV 90.2 90.4 91.4 91.9 92.4  PLT 292 288 287 312 279   Cardiac Enzymes: Recent Labs  Lab 10/29/23 0535 10/30/23 0509 10/31/23 0500 11/03/23 1256  CKTOTAL 2,788* 2,145* 1,022* 101   BNP: Invalid input(s): POCBNP CBG: Recent Labs  Lab 11/03/23 1154 11/03/23 1635 11/03/23 2139 11/04/23 0608 11/04/23 1217  GLUCAP 133* 115* 141* 98 116*   D-Dimer No results for input(s): DDIMER in the last 72 hours. Hgb A1c No results for input(s): HGBA1C in the last 72 hours. Lipid Profile No results for input(s): CHOL, HDL, LDLCALC, TRIG, CHOLHDL, LDLDIRECT in the last 72 hours. Thyroid function studies No results for input(s): TSH, T4TOTAL, T3FREE, THYROIDAB in the last 72 hours.  Invalid input(s): FREET3 Anemia work up No results for input(s): VITAMINB12, FOLATE, FERRITIN, TIBC, IRON , RETICCTPCT in the last 72 hours. Urinalysis    Component Value Date/Time   COLORURINE YELLOW 10/29/2023 1845    APPEARANCEUR HAZY (A) 10/29/2023 1845   LABSPEC 1.008 10/29/2023 1845   PHURINE 5.0 10/29/2023 1845   GLUCOSEU NEGATIVE 10/29/2023 1845   HGBUR LARGE (A) 10/29/2023 1845   BILIRUBINUR NEGATIVE 10/29/2023 1845   KETONESUR NEGATIVE 10/29/2023 1845   PROTEINUR 30 (A) 10/29/2023 1845   NITRITE NEGATIVE 10/29/2023 1845   LEUKOCYTESUR NEGATIVE 10/29/2023 1845   Sepsis Labs Recent Labs  Lab 11/01/23 0509 11/02/23 0118 11/03/23 0115 11/04/23 0949  WBC 9.2 11.7* 11.2* 11.1*   Microbiology Recent Results (from the past 240 hours)  Culture, blood (routine x 2)     Status: None   Collection Time: 10/27/23 10:19 PM   Specimen: BLOOD  Result Value Ref Range Status   Specimen Description BLOOD LEFT ARM  Final   Special Requests   Final    BOTTLES DRAWN AEROBIC AND ANAEROBIC Blood Culture adequate volume   Culture   Final    NO GROWTH 5 DAYS Performed at [facility], [address], [place], [place] [address]    Report Status 11/01/2023 FINAL  Final  Culture, blood (routine x 2)     Status: None   Collection Time: 10/27/23 10:20 PM   Specimen: BLOOD RIGHT ARM  Result Value Ref Range Status   Specimen Description BLOOD RIGHT ARM  Final   Special Requests  Final    BOTTLES DRAWN AEROBIC AND ANAEROBIC Blood Culture adequate volume   Culture   Final    NO GROWTH 5 DAYS Performed at [facility], [address] [place] [place], [place] [address]    Report Status 11/01/2023 FINAL  Final     Time coordinating discharge: Over 30 minutes  SIGNED:   Darcel Dawley, MD  [facility] 11/04/2023, 12:54 PM Pager   If 7PM-7AM, please contact night-coverage

## 2023-11-04 NOTE — TOC Transition Note (Addendum)
 Transition of Care [facility]) - Discharge Note   Patient Details  Name: Joe Coleman MRN: [id number] Date of Birth: [birth year]  [facility] of [facility] CM/SW Contact:  Bridget Cordella Simmonds, LCSW Phone Number: 11/04/2023, 12:35 PM   Clinical Narrative:   Pt discharging to [facility].  RN call report to [phone].  PTAR called [phone].   0900: CSW confirmed with [facility] that they can receive pt today.   Final next level of care: Skilled Nursing Facility Barriers to Discharge: Barriers Resolved   Patient Goals and CMS Choice Patient states their goals for this hospitalization and ongoing recovery are:: get stronger, get home          Discharge Placement              Patient chooses bed at: [facility] Patient to be transferred to facility by: ptar Name of family member notified: left messages with cousin Levander, niece [name] Patient and family notified of of transfer: 11/04/23  Discharge Plan and Services Additional resources added to the After Visit Summary for     Discharge Planning Services: CM Consult Post Acute Care Choice: Skilled Nursing Facility                               Social Drivers of Health (SDOH) Interventions SDOH Screenings   Food Insecurity: No Food Insecurity (10/29/2023)  Housing: Low Risk  (10/29/2023)  Transportation Needs: No Transportation Needs (10/29/2023)  Utilities: Not At Risk (10/29/2023)  Financial Resource Strain: Medium Risk (05/02/2021)   Received from [facility]  Physical Activity: Inactive (08/25/2018)   Received from [facility]  Social Connections: Unknown (10/29/2023)  Stress: No Stress Concern Present (08/25/2018)   Received from [facility]  Tobacco Use: Medium Risk (10/27/2023)  Health Literacy: High Risk (05/04/2022)   Received from [facility]     Readmission Risk Interventions    09/01/2023   12:00 PM  Readmission Risk Prevention Plan  Transportation Screening Complete   Medication Review (RN Care Manager) Complete  PCP or Specialist appointment within 3-5 days of discharge Complete  HRI or Home Care Consult Complete  SW Recovery Care/Counseling Consult Complete  Palliative Care Screening Not Applicable  Skilled Nursing Facility Complete

## 2023-11-04 NOTE — Progress Notes (Signed)
 Physical Therapy Treatment Patient Details Name: Joe Coleman MRN: [id number] DOB: 1945/09/19 Today's Date: 11/04/2023   History of Present Illness Pt is 78 y.o. male transfer from [facility] 10/29/23 with AMS, AKI, and rhabdomyolysis. SABRA PMHx: T2DM, COPD, HTN, past tobacco abuse, and hx R tib fx s/p ORIF (7/25), wound infection (8/28), wound dehiscence with surgical hardware visible (9/9), s/p removal of hardware, I&D, skin graft, and wound vac placement (9/10), wound vac change (9/23). Of note, recent hospitalization 9/9-9/19 for right leg wound with infected hardware d/t Enterobacter & Klebsiella, placed on IV antibiotics as well as acute LLE distal DVT started on Eliquis .    PT Comments  Pt tolerates treatment well, pt performs multiple transfers during session with a tendency for a posterior lean during standing. Pt remains at a high risk for falls due to imbalance and weakness. Pt with edema to R foot, PT assists pt in elevating extremity and encourages ankle pumps. Patient will benefit from continued inpatient follow up therapy, <3 hours/day.   If plan is discharge home, recommend the following: A lot of help with walking and/or transfers;A lot of help with bathing/dressing/bathroom;Assistance with cooking/housework;Direct supervision/assist for medications management;Assist for transportation;Help with stairs or ramp for entrance;Supervision due to cognitive status   Can travel by private vehicle     No  Equipment Recommendations  BSC/3in1    Recommendations for Other Services       Precautions / Restrictions Precautions Precautions: Fall Recall of Precautions/Restrictions: Impaired Required Braces or Orthoses: Knee Immobilizer - Right Knee Immobilizer - Right: On at all times Restrictions Weight Bearing Restrictions Per Provider Order: Yes RLE Weight Bearing Per Provider Order: Non weight bearing     Mobility  Bed Mobility Overal bed mobility: Needs Assistance Bed Mobility:  Supine to Sit, Sit to Supine     Supine to sit: Supervision Sit to supine: Supervision        Transfers Overall transfer level: Needs assistance Equipment used: Rolling walker (2 wheels) Transfers: Sit to/from Stand, Bed to chair/wheelchair/BSC Sit to Stand: Min assist   Step pivot transfers: Mod assist      Lateral/Scoot Transfers: Min assist General transfer comment: posterior lean in standing. Pt transfers from bed to recliner and then stands twice from recliner before transferring back to bed via lateral scoot    Ambulation/Gait                   Stairs             Wheelchair Mobility     Tilt Bed    Modified Rankin (Stroke Patients Only)       Balance Overall balance assessment: Needs assistance Sitting-balance support: No upper extremity supported, Feet supported Sitting balance-Leahy Scale: Good     Standing balance support: Bilateral upper extremity supported, Reliant on assistive device for balance Standing balance-Leahy Scale: Poor                              Communication Communication Communication: No apparent difficulties  Cognition Arousal: Alert Behavior During Therapy: WFL for tasks assessed/performed   PT - Cognitive impairments: No family/caregiver present to determine baseline, Sequencing, Problem solving                         Following commands: Intact      Cueing Cueing Techniques: Verbal cues  Exercises      General Comments General comments (  skin integrity, edema, etc.): VSS on RA      Pertinent Vitals/Pain Pain Assessment Pain Assessment: Faces Faces Pain Scale: Hurts little more Pain Location: RLE Pain Descriptors / Indicators: Aching Pain Intervention(s): Monitored during session    Home Living                          Prior Function            PT Goals (current goals can now be found in the care plan section) Acute Rehab PT Goals Patient Stated Goal: Return  Home and regain independence Progress towards PT goals: Progressing toward goals    Frequency    Min 2X/week      PT Plan      Co-evaluation              AM-PAC PT 6 Clicks Mobility   Outcome Measure  Help needed turning from your back to your side while in a flat bed without using bedrails?: A Little Help needed moving from lying on your back to sitting on the side of a flat bed without using bedrails?: A Little Help needed moving to and from a bed to a chair (including a wheelchair)?: A Little Help needed standing up from a chair using your arms (e.g., wheelchair or bedside chair)?: A Lot Help needed to walk in hospital room?: Total Help needed climbing 3-5 steps with a railing? : Total 6 Click Score: 13    End of Session Equipment Utilized During Treatment: Gait belt Activity Tolerance: Patient tolerated treatment well Patient left: in bed;with call bell/phone within reach;with bed alarm set Nurse Communication: Mobility status PT Visit Diagnosis: Unsteadiness on feet (R26.81);Difficulty in walking, not elsewhere classified (R26.2);Pain;Other abnormalities of gait and mobility (R26.89) Pain - Right/Left: Right Pain - part of body: Leg     Time: 1201-1224 PT Time Calculation (min) (ACUTE ONLY): 23 min  Charges:    $Therapeutic Activity: 23-37 mins PT General Charges $$ ACUTE PT VISIT: 1 Visit                     Bernardino JINNY Ruth, PT, DPT Acute Rehabilitation Office [phone]    Bernardino JINNY Ruth 11/04/2023, 2:01 PM

## 2023-11-12 ENCOUNTER — Ambulatory Visit (INDEPENDENT_AMBULATORY_CARE_PROVIDER_SITE_OTHER): Admitting: Internal Medicine

## 2023-11-12 ENCOUNTER — Other Ambulatory Visit: Payer: Self-pay

## 2023-11-12 VITALS — BP 134/85 | HR 115 | Temp 98.4°F | Resp 16

## 2023-11-12 DIAGNOSIS — N183 Chronic kidney disease, stage 3 unspecified: Secondary | ICD-10-CM

## 2023-11-12 DIAGNOSIS — B961 Klebsiella pneumoniae [K. pneumoniae] as the cause of diseases classified elsewhere: Secondary | ICD-10-CM

## 2023-11-12 DIAGNOSIS — F17210 Nicotine dependence, cigarettes, uncomplicated: Secondary | ICD-10-CM

## 2023-11-12 DIAGNOSIS — T847XXD Infection and inflammatory reaction due to other internal orthopedic prosthetic devices, implants and grafts, subsequent encounter: Secondary | ICD-10-CM

## 2023-11-12 DIAGNOSIS — B9689 Other specified bacterial agents as the cause of diseases classified elsewhere: Secondary | ICD-10-CM

## 2023-11-12 DIAGNOSIS — T84622D Infection and inflammatory reaction due to internal fixation device of right tibia, subsequent encounter: Secondary | ICD-10-CM

## 2023-11-12 NOTE — Patient Instructions (Addendum)
 Last dose of linezolid  today Start doxycycline  100mg  PO bid tomorrow continue till 10/21 Continue ertapenem  till 10/21 Follow-up with infectious disease on 10/21

## 2023-11-12 NOTE — Progress Notes (Signed)
 Patient: Joe Coleman  DOB: 08-07-1945 MRN: [id number] PCP: [facility]   Patient Active Problem List   Diagnosis Date Noted   Hardware complicating wound infection 10/29/2023   Uncontrolled type 2 diabetes mellitus with hyperglycemia, without long-term current use of insulin  (HCC) 10/29/2023   Type 2 diabetes mellitus with peripheral neuropathy (HCC) 10/28/2023   GERD without esophagitis 10/28/2023   Chronic obstructive pulmonary disease (COPD) (HCC) 10/28/2023   Cellulitis of right leg 10/28/2023   Rhabdomyolysis 10/28/2023   Pressure injury of skin 10/28/2023   History of DVT (deep vein thrombosis) 10/28/2023   Normocytic anemia 10/28/2023   Acute metabolic encephalopathy 10/27/2023   Acute kidney injury superimposed on chronic kidney disease 10/27/2023   Open knee wound, right, sequela 10/14/2023   Malnutrition of moderate degree 08/30/2023   Tibia/fibula fracture 08/24/2023   Cellulitis of right arm 08/10/2023   Eye pain, bilateral 07/26/2022   Fall 07/26/2022   Lymphopenia 07/26/2022   Shortness of breath 05/21/2022   COVID-19 virus infection 03/17/2020   COPD with acute exacerbation (HCC) 03/17/2020   Type 2 diabetes mellitus (HCC) 03/17/2020   HTN (hypertension) 03/17/2020   Vitamin B12 deficiency 10/05/2019   Erectile dysfunction 11/10/2017   Chronic kidney disease (CKD), stage III (moderate) (HCC) 09/19/2017   Mild intermittent asthma without complication 09/19/2017   Chronic cough 10/22/2016   Epigastric pain 06/30/2015   Chronic bilateral low back pain without sciatica 05/31/2015   Neuropathic pain 03/01/2015   Benign prostatic hyperplasia with lower urinary tract symptoms 04/06/2014   Adenomatous colon polyp 08/31/2013   Depression 06/21/2013   Foot pain, bilateral 05/07/2013   Insomnia 03/24/2013   Vitiligo 12/23/2012   Primary localized osteoarthrosis, lower leg 01/15/2011   Pain medication agreement broken 09/19/2010   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  Or cultures grew Klebsiella and Enterobacter cloacae.  Discharged on daptomycin  cefepime  x 6 weeks from OR EOT 10/21.  He was readmitted on 9/24 due to altered mental status and elevated CK.  Antibiotic regimen was changed to linezolid  x 2 weeks then Doxy to complete course as well as ertapenem  till 1021t Today notes pt was seen by ortho yesterday. Took stiches out, redressed Review of Systems  All other systems reviewed and are negative.   Past Medical History:  Diagnosis Date   Cancer (HCC)    stomach and colon   Diabetes mellitus without complication (HCC)    Type II   Hypertension     Outpatient Medications Prior to Visit  Medication Sig Dispense Refill   acetaminophen  (TYLENOL ) 325 MG tablet Take 2 tablets (650 mg total) by mouth every 6 (six) hours as needed for mild pain (pain score 1-3) or fever (or Fever >/= 101).     albuterol  (PROVENTIL  HFA;VENTOLIN  HFA) 108 (90 Base) MCG/ACT inhaler Inhale 2 puffs into the lungs every 6 (six) hours as needed for wheezing or shortness of breath. 1 Inhaler 2   apixaban  (ELIQUIS ) 5 MG TABS tablet Take 2 tablets (10 mg total) by mouth 2 (two) times daily for 1  day, THEN 1 tablet (5 mg total) 2 (two) times daily.     bisacodyl  (DULCOLAX) 5 MG EC tablet Take 1 tablet (5 mg total) by mouth daily as needed for moderate constipation. 30 tablet 0   Chlorhexidine  Gluconate Cloth 2 % PADS Apply 6 each topically daily.     cholecalciferol  (CHOLECALCIFEROL ) 25 MCG  tablet Take 1 tablet (1,000 Units total) by mouth daily.     cyanocobalamin  (VITAMIN B12) 1000 MCG tablet Take 1 tablet (1,000 mcg total) by mouth daily. 120 tablet 0   [START ON 11/13/2023] doxycycline  (VIBRA -TABS) 100 MG tablet Take 1 tablet (100 mg total) by mouth every 12 (twelve) hours for 13 days. 26 tablet 0   ertapenem  (INVANZ ) IVPB Inject 500 mg into the vein daily for 25 days. 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     ferrous sulfate  325 (65 FE) MG tablet Take 1 tablet (325 mg total) by mouth daily with breakfast. 30 tablet 0   gabapentin  (NEURONTIN ) 100 MG capsule Take 1 capsule (100 mg total) by mouth at bedtime.     gabapentin  (NEURONTIN ) 300 MG capsule Take 300 mg by mouth at bedtime.     guaiFENesin  (MUCINEX ) 600 MG 12 hr tablet Take 1 tablet (600 mg total) by mouth 2 (two) times daily. 60 tablet 0   linezolid  (ZYVOX ) 600 MG tablet Take 1 tablet (600 mg total) by mouth every 12 (twelve) hours for 8 days. 16 tablet 0   methocarbamol  (ROBAXIN ) 500 MG tablet Take 1 tablet (500 mg total) by mouth every 6 (six) hours as needed for muscle spasms. 30 tablet 0   Nystatin (GERHARDT'S BUTT CREAM) CREA Apply 1 Application topically 2 (two) times daily for 10 days. 20 each 0   ondansetron  (ZOFRAN ) 4 MG tablet Take 1 tablet (4 mg total) by mouth every 6 (six) hours as needed for nausea.     polyethylene glycol (MIRALAX  / GLYCOLAX ) 17 g packet Take 17 g by mouth daily.     sodium chloride  flush (NS) 0.9 % SOLN 10-40 mLs by Intracatheter route every 12 (twelve) hours.     TRELEGY ELLIPTA 200-62.5-25 MCG/ACT AEPB Inhale 1 puff into the lungs daily.     No facility-administered medications prior to visit.     Allergies   Allergen Reactions   Losartan Other (See Comments)    Hyperkalemia (>5.5 multiple times on losartan 25mg )   Chicken Allergy Nausea And Vomiting        Poultry Meal Nausea And Vomiting    Social History   Tobacco Use   Smoking status: Former   Smokeless tobacco: Never  Vaping Use   Vaping status: Never Used  Substance Use Topics   Alcohol use: No   Drug use: No    Family History  Problem Relation Age of Onset   Heart disease Mother    Heart disease Brother     Objective:  There were no vitals filed for this visit. There is no height or weight on file to calculate BMI.  Physical Exam Constitutional:      General: He is not in acute distress.    Appearance: He is normal weight. He is not toxic-appearing.  HENT:  Head: Normocephalic and atraumatic.     Right Ear: External ear normal.     Left Ear: External ear normal.     Nose: No congestion or rhinorrhea.     Mouth/Throat:     Mouth: Mucous membranes are moist.     Pharynx: Oropharynx is clear.  Eyes:     Extraocular Movements: Extraocular movements intact.     Conjunctiva/sclera: Conjunctivae normal.     Pupils: Pupils are equal, round, and reactive to light.  Cardiovascular:     Rate and Rhythm: Normal rate and regular rhythm.     Heart sounds: No murmur heard.    No friction rub. No gallop.  Pulmonary:     Effort: Pulmonary effort is normal.     Breath sounds: Normal breath sounds.  Abdominal:     General: Abdomen is flat. Bowel sounds are normal.     Palpations: Abdomen is soft.  Musculoskeletal:     Cervical back: Normal range of motion and neck supple.  Skin:    General: Skin is warm and dry.  Neurological:     General: No focal deficit present.     Mental Status: He is oriented to person, place, and time.  Psychiatric:        Mood and Affect: Mood normal.        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  Initially discharged on cefepime  and daptomycin .  He was readmitted 9/24 for altered mental status crease CPK.  Transition to ertapenem  and linezolid  plan to complete antibiotic course with doxycycline . -seen by ortho yesterday, progressing well.   #Medication management #PICC line - Ertapenem  till 10/21 then pull picc - Linezolid  x 2 weeks until 10/8 then doxycycline  till 10/21 - Labs: serum creatinine 2.82, CRP 2, ESR 51, WBC 11.9.  On 11/05/2023. - f/u on 10/21  Loney Stank, MD [facility]   11/12/23  8:15 AM I have personally spent 54 minutes involved in face-to-face and non-face-to-face activities for this patient on the day of the visit. Professional time spent includes the following activities: Preparing to see the patient (review of tests), Obtaining and/or reviewing separately obtained history (admission/discharge record), Performing a medically appropriate examination and/or evaluation , Ordering medications/tests/procedures, referring and communicating with other health care professionals, Documenting clinical information in the EMR, Independently interpreting results (not separately reported), Communicating results to the patient/family/caregiver, Counseling and educating the  patient/family/caregiver and Care coordination (not separately reported).

## 2023-11-25 ENCOUNTER — Ambulatory Visit (INDEPENDENT_AMBULATORY_CARE_PROVIDER_SITE_OTHER): Admitting: Internal Medicine

## 2023-11-25 ENCOUNTER — Other Ambulatory Visit: Payer: Self-pay

## 2023-11-25 ENCOUNTER — Encounter: Payer: Self-pay | Admitting: Internal Medicine

## 2023-11-25 VITALS — BP 125/73 | HR 100 | Temp 97.4°F

## 2023-11-25 DIAGNOSIS — N183 Chronic kidney disease, stage 3 unspecified: Secondary | ICD-10-CM

## 2023-11-25 DIAGNOSIS — T84622D Infection and inflammatory reaction due to internal fixation device of right tibia, subsequent encounter: Secondary | ICD-10-CM

## 2023-11-25 DIAGNOSIS — T847XXD Infection and inflammatory reaction due to other internal orthopedic prosthetic devices, implants and grafts, subsequent encounter: Secondary | ICD-10-CM

## 2023-11-25 DIAGNOSIS — F17210 Nicotine dependence, cigarettes, uncomplicated: Secondary | ICD-10-CM

## 2023-11-25 MED ORDER — CIPROFLOXACIN HCL 500 MG PO TABS: 500.0000 mg | ORAL_TABLET | Freq: Every day | ORAL | 0 refills | Status: AC

## 2023-11-25 MED ORDER — DOXYCYCLINE HYCLATE 100 MG PO TABS: 100.0000 mg | ORAL_TABLET | Freq: Two times a day (BID) | ORAL | 0 refills | Status: DC

## 2023-11-25 MED ORDER — DOXYCYCLINE HYCLATE 100 MG PO TABS: 100.0000 mg | ORAL_TABLET | Freq: Two times a day (BID) | ORAL | 0 refills | Status: AC

## 2023-11-25 MED ORDER — CIPROFLOXACIN HCL 500 MG PO TABS: 500.0000 mg | ORAL_TABLET | Freq: Every day | ORAL | 0 refills | Status: DC

## 2023-11-25 NOTE — Patient Instructions (Addendum)
 Nystatin powder in groin Continue doxycyline, start ciprofloxacin Pull picc Please follow up with ortho and bring ortho note to next ID visit F/U in 2 weeks

## 2023-11-25 NOTE — Progress Notes (Signed)
 Patient Active Problem List   Diagnosis Date Noted   Hardware complicating wound infection 10/29/2023   Uncontrolled type 2 diabetes mellitus with hyperglycemia, without long-term current use of insulin  (HCC) 10/29/2023   Type 2 diabetes mellitus with peripheral neuropathy (HCC) 10/28/2023   GERD without esophagitis 10/28/2023   Chronic obstructive pulmonary disease (COPD) (HCC) 10/28/2023   Cellulitis of right leg 10/28/2023   Rhabdomyolysis 10/28/2023   Pressure injury of skin 10/28/2023   History of DVT (deep vein thrombosis) 10/28/2023   Normocytic anemia 10/28/2023   Acute metabolic encephalopathy 10/27/2023   Acute kidney injury superimposed on chronic kidney disease 10/27/2023   Open knee wound, right, sequela 10/14/2023   Malnutrition of moderate degree 08/30/2023   Tibia/fibula fracture 08/24/2023   Cellulitis of right arm 08/10/2023   Eye pain, bilateral 07/26/2022   Fall 07/26/2022   Lymphopenia 07/26/2022   Shortness of breath 05/21/2022   COVID-19 virus infection 03/17/2020   COPD with acute exacerbation (HCC) 03/17/2020   Type 2 diabetes mellitus (HCC) 03/17/2020   HTN (hypertension) 03/17/2020   Vitamin B12 deficiency 10/05/2019   Erectile dysfunction 11/10/2017   Chronic kidney disease (CKD), stage III (moderate) (HCC) 09/19/2017   Mild intermittent asthma without complication 09/19/2017   Chronic cough 10/22/2016   Epigastric pain 06/30/2015   Chronic bilateral low back pain without sciatica 05/31/2015   Neuropathic pain 03/01/2015   Benign prostatic hyperplasia with lower urinary tract symptoms 04/06/2014   Adenomatous colon polyp 08/31/2013   Depression 06/21/2013   Foot pain, bilateral 05/07/2013   Insomnia 03/24/2013   Vitiligo 12/23/2012   Primary localized osteoarthrosis, lower leg 01/15/2011   Pain medication agreement broken 09/19/2010   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   ALBUTEROL  (PROVENTIL  HFA;VENTOLIN  HFA) 108 (90 BASE) MCG/ACT INHALER    Inhale 2 puffs into the lungs every 6 (six) hours as needed for wheezing or shortness of breath.   APIXABAN  (ELIQUIS ) 5 MG TABS TABLET    Take 2 tablets (10 mg total) by mouth 2 (two) times daily for 1 day, THEN 1 tablet (5 mg total) 2 (two) times daily.   BISACODYL  (DULCOLAX) 5 MG EC TABLET    Take 1 tablet (5 mg total) by mouth daily as needed for moderate constipation.   CHLORHEXIDINE  GLUCONATE CLOTH 2 % PADS    Apply 6 each topically daily.   CHOLECALCIFEROL  (CHOLECALCIFEROL ) 25 MCG TABLET    Take 1 tablet (1,000 Units total) by mouth daily.   CYANOCOBALAMIN  (VITAMIN B12) 1000 MCG TABLET    Take 1 tablet (1,000 mcg total) by mouth daily.   DOXYCYCLINE  (VIBRA -TABS) 100 MG TABLET    Take 1 tablet (100 mg total) by mouth every 12 (twelve) hours for 13 days.   ERTAPENEM  (INVANZ ) IVPB    Inject 500 mg into the vein daily for 25 days. 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   FERROUS SULFATE  325 (65 FE) MG TABLET    Take 1 tablet (325 mg total) by mouth daily with breakfast.   GABAPENTIN  (NEURONTIN ) 100 MG CAPSULE    Take 1 capsule (100 mg total) by mouth at bedtime.   GABAPENTIN  (NEURONTIN ) 300 MG CAPSULE    Take 300 mg by mouth at bedtime.   GUAIFENESIN  (MUCINEX ) 600 MG 12 HR TABLET    Take 1 tablet (600 mg total) by mouth 2 (two) times  daily.   METHOCARBAMOL  (ROBAXIN ) 500 MG TABLET    Take 1 tablet (500 mg total) by mouth every 6 (six) hours as needed for muscle spasms.   ONDANSETRON  (ZOFRAN ) 4 MG TABLET    Take 1 tablet (4 mg total) by mouth every 6 (six) hours as needed for nausea.   POLYETHYLENE GLYCOL (MIRALAX  / GLYCOLAX ) 17 G PACKET    Take 17 g by mouth daily.   SODIUM CHLORIDE  FLUSH (NS) 0.9 % SOLN    10-40 mLs by Intracatheter route every 12 (twelve) hours.   TRELEGY ELLIPTA 200-62.5-25 MCG/ACT AEPB    Inhale 1 puff into the lungs daily.  Modified Medications   No medications on file  Discontinued Medications   No medications on file    Subjective:  78 y.o. 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  Or cultures grew Klebsiella and Enterobacter cloacae.  Discharged on daptomycin  cefepime  x 6 weeks from OR EOT 10/21.  He was readmitted on 9/24 due to altered mental status and elevated CK.  Antibiotic regimen was changed to linezolid  x 2 weeks then Doxy to complete course as well as ertapenem  till 1021t 11/12/23: notes pt was seen by ortho yesterday. Took stiches out, redressed Today: doing well. No new complaints Review of Systems: Review of Systems  All other systems reviewed and are negative.   Past Medical History:  Diagnosis Date   Cancer (HCC)    stomach and colon   Diabetes mellitus without complication (HCC)    Type II   Hypertension     Social History   Tobacco Use   Smoking status: Former   Smokeless tobacco: Never  Vaping Use   Vaping status: Never Used  Substance Use Topics   Alcohol use: No   Drug use: No    Family History  Problem Relation Age of Onset   Heart disease Mother    Heart disease Brother     Allergies   Allergen Reactions   Losartan Other (See Comments)    Hyperkalemia (>5.5 multiple times on losartan 25mg )   Chicken Allergy Nausea And Vomiting        Poultry Meal Nausea And Vomiting    Health Maintenance  Topic Date Due   Medicare Annual Wellness (AWV)  Never done   COVID-19 Vaccine (1) Never done   FOOT EXAM  Never done   OPHTHALMOLOGY EXAM  Never done   Diabetic kidney evaluation - Urine ACR  Never done   DTaP/Tdap/Td (1 - Tdap) Never done   Zoster Vaccines- Shingrix (2 of 2) 08/26/2021   Influenza Vaccine  09/05/2023   HEMOGLOBIN A1C  02/10/2024   Diabetic kidney evaluation - eGFR measurement  11/03/2024  Pneumococcal Vaccine: 50+ Years  Completed   Meningococcal B Vaccine  Aged Out   Colonoscopy  Discontinued    Objective:  There were no vitals filed for this visit. There is no height or weight on file to calculate BMI.  Physical Exam Constitutional:      General: He is not in acute distress.    Appearance: He is normal weight. He is not toxic-appearing.  HENT:     Head: Normocephalic and atraumatic.     Right Ear: External ear normal.     Left Ear: External ear normal.     Nose: No congestion or rhinorrhea.     Mouth/Throat:     Mouth: Mucous membranes are moist.     Pharynx: Oropharynx is clear.  Eyes:     Extraocular Movements: Extraocular movements intact.     Conjunctiva/sclera: Conjunctivae normal.     Pupils: Pupils are equal, round, and reactive to light.  Cardiovascular:     Rate and Rhythm: Normal rate and regular rhythm.     Heart sounds: No murmur heard.    No friction rub. No gallop.  Pulmonary:     Effort: Pulmonary effort is normal.     Breath sounds: Normal breath sounds.  Abdominal:     General: Abdomen is flat. Bowel sounds are normal.     Palpations: Abdomen is soft.  Musculoskeletal:        General: No swelling.     Cervical back: Normal range of motion and neck supple.  Skin:    General: Skin is warm and dry.  Neurological:      General: No focal deficit present.     Mental Status: He is oriented to person, place, and time.  Psychiatric:        Mood and Affect: Mood normal.       Lab Results Lab Results  Component Value Date   WBC 11.1 (H) 11/04/2023   HGB 8.5 (L) 11/04/2023   HCT 28.1 (L) 11/04/2023   MCV 92.4 11/04/2023   PLT 279 11/04/2023    Lab Results  Component Value Date   CREATININE 2.93 (H) 11/04/2023   BUN 30 (H) 11/04/2023   NA 136 11/04/2023   K 4.5 11/04/2023   CL 105 11/04/2023   CO2 21 (L) 11/04/2023    Lab Results  Component Value Date   ALT 6 10/16/2023   AST 14 (L) 10/16/2023   ALKPHOS 67 10/16/2023   BILITOT 0.6 10/16/2023    No results found for: CHOL, HDL, LDLCALC, LDLDIRECT, TRIG, CHOLHDL No results found for: LABRPR, RPRTITER No results found for: HIV1RNAQUANT, HIV1RNAVL, CD4TABS   Problem List Items Addressed This Visit   None  Results   Assessment/Plan #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  Initially discharged on cefepime  and daptomycin .  He was readmitted 9/24 for altered mental status crease CPK.  Transition to ertapenem  and linezolid  plan to complete antibiotic course with doxycycline . Plan: -Continue  doxy and  and add ciproloxicn(renally doses mg q 24h) tomorrow. -labs today -Please bring ortho notes ot visit.  -f/u in 2 weeks as wound is till a bit erythematous but improved. Pending clinical progression, consider stopping abx at that point   #Medication management #PICC line -Stop ertapenem  eot 10/21 then pull picc as planned.linezolid  till 10/8 ->Now on doxy. SABRA  Loney Stank, MD [facility] 11/25/2023, 2:57 PM   I have personally spent 42 minutes involved in  face-to-face and non-face-to-face activities for this patient on the day of the visit. Professional time spent includes the following activities: Preparing to see the patient (review of tests), Obtaining and/or reviewing separately obtained history (admission/discharge record), Performing a medically appropriate examination and/or evaluation , Ordering medications/tests/procedures, referring and communicating with other health care professionals, Documenting clinical information in the EMR, Independently interpreting results (not separately reported), Communicating results to the patient/family/caregiver, Counseling and educating the patient/family/caregiver and Care coordination (not separately reported).

## 2023-11-25 NOTE — Progress Notes (Signed)
 Labs drawn via PICC line per Dr. Dennise. Line flushed with 10 mL normal saline and clamped. Patient tolerated procedure well.   Orders to pull PICC sent back with patient to [facility]. Printed Rxs for doxycycline  and ciprofloxacin sent back as well.   Naheem Mosco, BSN, RN

## 2023-11-26 LAB — CBC WITH DIFFERENTIAL/PLATELET
Absolute Lymphocytes: 1611 {cells}/uL (ref 850–3900)
Absolute Monocytes: 992 {cells}/uL — ABNORMAL HIGH (ref 200–950)
Basophils Absolute: 46 {cells}/uL (ref 0–200)
Basophils Relative: 0.5 %
Eosinophils Absolute: 382 {cells}/uL (ref 15–500)
Eosinophils Relative: 4.2 %
HCT: 29 % — ABNORMAL LOW (ref 38.5–50.0)
Hemoglobin: 9 g/dL — ABNORMAL LOW (ref 13.2–17.1)
MCH: 27.9 pg (ref 27.0–33.0)
MCHC: 31 g/dL — ABNORMAL LOW (ref 32.0–36.0)
MCV: 89.8 fL (ref 80.0–100.0)
MPV: 11.2 fL (ref 7.5–12.5)
Monocytes Relative: 10.9 %
Neutro Abs: 6070 {cells}/uL (ref 1500–7800)
Neutrophils Relative %: 66.7 %
Platelets: 371 Thousand/uL (ref 140–400)
RBC: 3.23 Million/uL — ABNORMAL LOW (ref 4.20–5.80)
RDW: 14.1 % (ref 11.0–15.0)
Total Lymphocyte: 17.7 %
WBC: 9.1 Thousand/uL (ref 3.8–10.8)

## 2023-11-26 LAB — COMPLETE METABOLIC PANEL WITHOUT GFR
AG Ratio: 1.3 (calc) (ref 1.0–2.5)
ALT: 12 U/L (ref 9–46)
AST: 22 U/L (ref 10–35)
Albumin: 3.2 g/dL — ABNORMAL LOW (ref 3.6–5.1)
Alkaline phosphatase (APISO): 159 U/L — ABNORMAL HIGH (ref 35–144)
BUN/Creatinine Ratio: 15 (calc) (ref 6–22)
BUN: 22 mg/dL (ref 7–25)
CO2: 27 mmol/L (ref 20–32)
Calcium: 8.5 mg/dL — ABNORMAL LOW (ref 8.6–10.3)
Chloride: 105 mmol/L (ref 98–110)
Creat: 1.48 mg/dL — ABNORMAL HIGH (ref 0.70–1.22)
Globulin: 2.5 g/dL (ref 1.9–3.7)
Glucose, Bld: 116 mg/dL — ABNORMAL HIGH (ref 65–99)
Potassium: 4.8 mmol/L (ref 3.5–5.3)
Sodium: 138 mmol/L (ref 135–146)
Total Bilirubin: 0.3 mg/dL (ref 0.2–1.2)
Total Protein: 5.7 g/dL — ABNORMAL LOW (ref 6.1–8.1)

## 2023-11-26 LAB — C-REACTIVE PROTEIN: CRP: 17.5 mg/L — ABNORMAL HIGH (ref <8.0)

## 2023-11-26 LAB — SEDIMENTATION RATE: Sed Rate: 38 mm/h — ABNORMAL HIGH (ref 0–20)

## 2023-12-02 ENCOUNTER — Other Ambulatory Visit: Payer: Self-pay | Admitting: Pulmonary Disease

## 2023-12-02 DIAGNOSIS — R053 Chronic cough: Secondary | ICD-10-CM

## 2023-12-02 DIAGNOSIS — R911 Solitary pulmonary nodule: Secondary | ICD-10-CM

## 2023-12-05 ENCOUNTER — Emergency Department (HOSPITAL_COMMUNITY)

## 2023-12-05 ENCOUNTER — Encounter (HOSPITAL_COMMUNITY): Payer: Self-pay | Admitting: Emergency Medicine

## 2023-12-05 ENCOUNTER — Other Ambulatory Visit: Payer: Self-pay

## 2023-12-05 ENCOUNTER — Emergency Department (HOSPITAL_COMMUNITY)
Admission: EM | Admit: 2023-12-05 | Discharge: 2023-12-05 | Disposition: A | Discharge status: Home / Self Care | Attending: Emergency Medicine | Admitting: Emergency Medicine

## 2023-12-05 DIAGNOSIS — D649 Anemia, unspecified: Secondary | ICD-10-CM | POA: Insufficient documentation

## 2023-12-05 DIAGNOSIS — I129 Hypertensive chronic kidney disease with stage 1 through stage 4 chronic kidney disease, or unspecified chronic kidney disease: Secondary | ICD-10-CM | POA: Diagnosis not present

## 2023-12-05 DIAGNOSIS — R1013 Epigastric pain: Secondary | ICD-10-CM | POA: Diagnosis present

## 2023-12-05 DIAGNOSIS — E1122 Type 2 diabetes mellitus with diabetic chronic kidney disease: Secondary | ICD-10-CM | POA: Insufficient documentation

## 2023-12-05 DIAGNOSIS — Z79899 Other long term (current) drug therapy: Secondary | ICD-10-CM | POA: Diagnosis not present

## 2023-12-05 DIAGNOSIS — N1831 Chronic kidney disease, stage 3a: Secondary | ICD-10-CM | POA: Insufficient documentation

## 2023-12-05 DIAGNOSIS — Z7901 Long term (current) use of anticoagulants: Secondary | ICD-10-CM | POA: Insufficient documentation

## 2023-12-05 DIAGNOSIS — R0789 Other chest pain: Secondary | ICD-10-CM | POA: Insufficient documentation

## 2023-12-05 DIAGNOSIS — J449 Chronic obstructive pulmonary disease, unspecified: Secondary | ICD-10-CM | POA: Insufficient documentation

## 2023-12-05 DIAGNOSIS — D72829 Elevated white blood cell count, unspecified: Secondary | ICD-10-CM | POA: Insufficient documentation

## 2023-12-05 DIAGNOSIS — R079 Chest pain, unspecified: Secondary | ICD-10-CM | POA: Diagnosis not present

## 2023-12-05 DIAGNOSIS — Z86718 Personal history of other venous thrombosis and embolism: Secondary | ICD-10-CM | POA: Diagnosis not present

## 2023-12-05 LAB — CBC WITH DIFFERENTIAL/PLATELET
Abs Immature Granulocytes: 0.14 K/uL — ABNORMAL HIGH (ref 0.00–0.07)
Basophils Absolute: 0.1 K/uL (ref 0.0–0.1)
Basophils Relative: 0 %
Eosinophils Absolute: 0.2 K/uL (ref 0.0–0.5)
Eosinophils Relative: 2 %
HCT: 32.3 % — ABNORMAL LOW (ref 39.0–52.0)
Hemoglobin: 9.6 g/dL — ABNORMAL LOW (ref 13.0–17.0)
Immature Granulocytes: 1 %
Lymphocytes Relative: 15 %
Lymphs Abs: 1.7 K/uL (ref 0.7–4.0)
MCH: 27.2 pg (ref 26.0–34.0)
MCHC: 29.7 g/dL — ABNORMAL LOW (ref 30.0–36.0)
MCV: 91.5 fL (ref 80.0–100.0)
Monocytes Absolute: 1 K/uL (ref 0.1–1.0)
Monocytes Relative: 9 %
Neutro Abs: 8.1 K/uL — ABNORMAL HIGH (ref 1.7–7.7)
Neutrophils Relative %: 73 %
Platelets: 305 K/uL (ref 150–400)
RBC: 3.53 MIL/uL — ABNORMAL LOW (ref 4.22–5.81)
RDW: 17.5 % — ABNORMAL HIGH (ref 11.5–15.5)
WBC: 11.2 K/uL — ABNORMAL HIGH (ref 4.0–10.5)
nRBC: 0 % (ref 0.0–0.2)

## 2023-12-05 LAB — URINALYSIS, W/ REFLEX TO CULTURE (INFECTION SUSPECTED)
Bacteria, UA: NONE SEEN
Bilirubin Urine: NEGATIVE
Glucose, UA: NEGATIVE mg/dL
Hgb urine dipstick: NEGATIVE
Ketones, ur: NEGATIVE mg/dL
Leukocytes,Ua: NEGATIVE
Nitrite: NEGATIVE
Protein, ur: NEGATIVE mg/dL
Specific Gravity, Urine: 1.012 (ref 1.005–1.030)
pH: 6 (ref 5.0–8.0)

## 2023-12-05 LAB — D-DIMER, QUANTITATIVE: D-Dimer, Quant: 1.23 ug{FEU}/mL — ABNORMAL HIGH (ref 0.00–0.50)

## 2023-12-05 LAB — COMPREHENSIVE METABOLIC PANEL WITH GFR
ALT: 15 U/L (ref 0–44)
AST: 23 U/L (ref 15–41)
Albumin: 2.8 g/dL — ABNORMAL LOW (ref 3.5–5.0)
Alkaline Phosphatase: 144 U/L — ABNORMAL HIGH (ref 38–126)
Anion gap: 13 (ref 5–15)
BUN: 14 mg/dL (ref 8–23)
CO2: 24 mmol/L (ref 22–32)
Calcium: 8.9 mg/dL (ref 8.9–10.3)
Chloride: 102 mmol/L (ref 98–111)
Creatinine, Ser: 1.67 mg/dL — ABNORMAL HIGH (ref 0.61–1.24)
GFR, Estimated: 41 mL/min — ABNORMAL LOW (ref >60)
Glucose, Bld: 153 mg/dL — ABNORMAL HIGH (ref 70–99)
Potassium: 5.2 mmol/L — ABNORMAL HIGH (ref 3.5–5.1)
Sodium: 139 mmol/L (ref 135–145)
Total Bilirubin: 0.6 mg/dL (ref 0.0–1.2)
Total Protein: 6.6 g/dL (ref 6.5–8.1)

## 2023-12-05 LAB — TROPONIN I (HIGH SENSITIVITY)
Troponin I (High Sensitivity): 7 ng/L (ref <18)
Troponin I (High Sensitivity): 7 ng/L (ref <18)

## 2023-12-05 LAB — LIPASE, BLOOD: Lipase: 20 U/L (ref 11–51)

## 2023-12-05 MED ORDER — BISACODYL 5 MG PO TBEC: 5.0000 mg | DELAYED_RELEASE_TABLET | Freq: Every day | ORAL | Status: DC | PRN

## 2023-12-05 MED ORDER — ONDANSETRON HCL 4 MG PO TABS: 4.0000 mg | ORAL_TABLET | Freq: Four times a day (QID) | ORAL | Status: DC | PRN

## 2023-12-05 MED ORDER — DOXYCYCLINE HYCLATE 100 MG PO TABS
100.0000 mg | ORAL_TABLET | Freq: Once | ORAL | Status: AC
Start: 2023-12-05 — End: 2023-12-05
  Administered 2023-12-05: 100 mg via ORAL
  Filled 2023-12-05: qty 1

## 2023-12-05 MED ORDER — ACETAMINOPHEN 500 MG PO TABS
1000.0000 mg | ORAL_TABLET | Freq: Once | ORAL | Status: AC
  Administered 2023-12-05: 1000 mg via ORAL
  Filled 2023-12-05: qty 2

## 2023-12-05 MED ORDER — VITAMIN D 25 MCG (1000 UNIT) PO TABS
1000.0000 [IU] | ORAL_TABLET | Freq: Every day | ORAL | Status: DC
Start: 2023-12-05 — End: 2023-12-05

## 2023-12-05 MED ORDER — IOHEXOL 350 MG/ML SOLN
75.0000 mL | Freq: Once | INTRAVENOUS | Status: AC | PRN
  Administered 2023-12-05: 75 mL via INTRAVENOUS

## 2023-12-05 MED ORDER — LIDOCAINE VISCOUS HCL 2 % MT SOLN
15.0000 mL | Freq: Once | OROMUCOSAL | Status: AC
  Administered 2023-12-05: 15 mL via ORAL
  Filled 2023-12-05: qty 15

## 2023-12-05 MED ORDER — TIZANIDINE HCL 4 MG PO TABS
2.0000 mg | ORAL_TABLET | Freq: Once | ORAL | Status: AC
  Administered 2023-12-05: 2 mg via ORAL
  Filled 2023-12-05: qty 1

## 2023-12-05 MED ORDER — GABAPENTIN 100 MG PO CAPS: 100.0000 mg | ORAL_CAPSULE | Freq: Every day | ORAL | Status: DC

## 2023-12-05 MED ORDER — PANTOPRAZOLE SODIUM 20 MG PO TBEC: 40.0000 mg | DELAYED_RELEASE_TABLET | Freq: Every day | ORAL | 0 refills | Status: AC

## 2023-12-05 MED ORDER — FAMOTIDINE IN NACL 20-0.9 MG/50ML-% IV SOLN
20.0000 mg | Freq: Once | INTRAVENOUS | Status: AC
  Administered 2023-12-05: 20 mg via INTRAVENOUS
  Filled 2023-12-05: qty 50

## 2023-12-05 MED ORDER — CEFDINIR 300 MG PO CAPS: 300.0000 mg | ORAL_CAPSULE | Freq: Two times a day (BID) | ORAL | 0 refills | Status: AC

## 2023-12-05 MED ORDER — APIXABAN 5 MG PO TABS: 5.0000 mg | ORAL_TABLET | Freq: Two times a day (BID) | ORAL | Status: DC

## 2023-12-05 MED ORDER — VITAMIN B-12 1000 MCG PO TABS
1000.0000 ug | ORAL_TABLET | Freq: Every day | ORAL | Status: DC
Start: 2023-12-05 — End: 2023-12-05

## 2023-12-05 MED ORDER — CIPROFLOXACIN HCL 500 MG PO TABS: 500.0000 mg | ORAL_TABLET | Freq: Every day | ORAL | Status: DC

## 2023-12-05 MED ORDER — PANTOPRAZOLE SODIUM 40 MG IV SOLR
40.0000 mg | Freq: Once | INTRAVENOUS | Status: AC
  Administered 2023-12-05: 40 mg via INTRAVENOUS
  Filled 2023-12-05: qty 10

## 2023-12-05 MED ORDER — FERROUS SULFATE 325 (65 FE) MG PO TABS: 325.0000 mg | ORAL_TABLET | Freq: Every day | ORAL | Status: DC

## 2023-12-05 MED ORDER — ALUM & MAG HYDROXIDE-SIMETH 200-200-20 MG/5ML PO SUSP
30.0000 mL | Freq: Once | ORAL | Status: AC
  Administered 2023-12-05: 30 mL via ORAL
  Filled 2023-12-05: qty 30

## 2023-12-05 NOTE — ED Triage Notes (Addendum)
 PT BIb by [facility] from [facility] for Intermittent Epigastric pain beginning around 9p and congestion/non-productive cough x 2 months.. No NV. NO CP presently.  PT has hx of abdominal hernia and feels like it might be associated with that.  Also hx of stomach cancer per EMS.  142/82 96 % HR 100

## 2023-12-05 NOTE — ED Notes (Signed)
 CCMD given CSN number and pt information.

## 2023-12-05 NOTE — ED Notes (Signed)
 Writer attempted to contact [facility] [place] for report x3. No answer.

## 2023-12-05 NOTE — Discharge Instructions (Addendum)
 Joe Coleman  Thank you for allowing us  to take care of you today.  You came to the Emergency Department today because you were having pain in the upper part of your abdomen as well as in your chest.  Here in the emergency department your heart number was normal twice, therefore you are not having a heart attack.  Your blood clot risk factor number is elevated, therefore we got a CT of your chest which does not show a blood clot in your lung, however it does show a early developing pneumonia, therefore we are going to start you on antibiotic named doxycycline .  Additionally we feel that your stomach pain is most likely related to excess acid in your stomach.  Your CT did not show any other abnormalities in your stomach such as any masses concerning for cancer.  We will prescribe you an antiacid medication that you can take daily for 2 weeks, in addition to taking your antibiotic.  To-Do: 1. Please follow-up with your primary doctor within 1 - 2 weeks / as soon as possible.   Please return to the Emergency Department or call 911 if you experience have worsening of your symptoms, or do not get better, new or different chest pain, shortness of breath, severe or significantly worsening pain, high fever, severe confusion, pass out or have any reason to think that you need emergency medical care.   We hope you feel better soon.   Mitzie Later, MD Department of Emergency Medicine [facility] [place]

## 2023-12-05 NOTE — ED Notes (Signed)
 Called [facility] FOR PT PICK UP.

## 2023-12-05 NOTE — ED Notes (Signed)
 Per CT, pt reported he wanted to shoot himself with his brothers gun when he leaves hospital. Pt currently reports no SI to writer, but states he is sick of being in a hospital. Pt states as long as I keep looking at you I'm fine with a smile. Provider made aware.

## 2023-12-05 NOTE — ED Provider Notes (Signed)
 [facility] Provider Note   CSN: [id number] Arrival date & time: 12/05/23  9186     History Chief Complaint  Patient presents with   Abdominal Pain    epigastric    HPI HPI: Joe Coleman is a 78 y.o. 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 Patient arrived via EMS from home.  History provided by patient.  No interpreter required during this encounter.  Patient reports that he has a history of chronic cough that is unchanged from baseline.  Reports he is presenting today for evaluation of epigastric pain that radiates to his chest.  Reports that he has a history of an abdominal hernia that was repaired with mesh, and he is concerned that that might be related.  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  Denies fever, chills, shortness of breath, nausea, vomiting, diarrhea.  Patient's recorded medical, surgical, social, medication list and allergies were reviewed in the Snapshot window as part of the initial history.   Prior to Admission medications   Medication Sig Start Date End Date Taking? Authorizing Provider  cefdinir (OMNICEF) 300 MG capsule Take 1 capsule (300 mg total) by mouth 2 (two) times daily for 7 days. 12/05/23 12/12/23 Yes Rogelia Jerilynn RAMAN, MD  pantoprazole  (PROTONIX ) 20 MG tablet Take 2 tablets (40 mg total) by mouth daily for 14 days. 12/05/23 12/19/23 Yes Rogelia Jerilynn RAMAN, MD  acetaminophen  (TYLENOL ) 325 MG tablet Take 2 tablets (650 mg total) by mouth every 6 (six) hours as needed for mild pain (pain score 1-3) or fever (or Fever  >/= 101). 10/29/23   Josette Ade, MD  albuterol  (PROVENTIL  HFA;VENTOLIN  HFA) 108 (90 Base) MCG/ACT inhaler Inhale 2 puffs into the lungs every 6 (six) hours as needed for wheezing or shortness of breath. 04/01/16   Lang Dover, MD  apixaban  (ELIQUIS ) 5 MG TABS tablet Take 2 tablets (10 mg total) by mouth 2 (two) times daily for 1 day, THEN 1 tablet (5 mg total) 2 (two) times daily. 10/29/23 11/28/23  Josette Ade, MD  bisacodyl  (DULCOLAX) 5 MG EC tablet Take 1 tablet (5 mg total) by mouth daily as needed for moderate constipation. 10/24/23   Patel, Pranav M, MD  Chlorhexidine  Gluconate Cloth 2 % PADS Apply 6 each topically daily. 10/30/23   Josette Ade, MD  cholecalciferol  (CHOLECALCIFEROL ) 25 MCG tablet Take 1 tablet (1,000 Units total) by mouth daily. 08/28/23   Danton Lauraine LABOR, PA-C  ciprofloxacin (CIPRO) 500 MG tablet Take 1 tablet (500 mg total) by mouth daily for 14 days. 11/25/23 12/09/23  Dennise Kingsley, MD  cyanocobalamin  (VITAMIN B12) 1000 MCG tablet Take 1 tablet (1,000 mcg total) by mouth daily. 10/24/23   Tobie Yetta HERO, MD  doxycycline  (VIBRA -TABS) 100 MG tablet Take 1 tablet (100 mg total) by mouth every 12 (twelve) hours for 14 days. 11/25/23 12/09/23  Dennise Kingsley, MD  feeding supplement, GLUCERNA SHAKE, (GLUCERNA SHAKE) LIQD Take 237 mLs by mouth 3 (three) times daily between meals. 09/01/23   Sherrill Cable Latif, DO  ferrous sulfate  325 (65 FE) MG tablet Take 1 tablet (325 mg total) by  mouth daily with breakfast. 10/25/23   Tobie Yetta HERO, MD  gabapentin  (NEURONTIN ) 100 MG capsule Take 1 capsule (100 mg total) by mouth at bedtime. 10/29/23   Josette Ade, MD  gabapentin  (NEURONTIN ) 300 MG capsule Take 300 mg by mouth at bedtime.    [provider]  guaiFENesin  (MUCINEX ) 600 MG 12 hr tablet Take 1 tablet (600 mg total) by mouth 2 (two) times daily. 10/24/23 10/23/24  Patel, Pranav M, MD  methocarbamol  (ROBAXIN ) 500 MG tablet Take 1 tablet (500 mg total) by  mouth every 6 (six) hours as needed for muscle spasms. 10/24/23   Tobie Yetta HERO, MD  ondansetron  (ZOFRAN ) 4 MG tablet Take 1 tablet (4 mg total) by mouth every 6 (six) hours as needed for nausea. 10/29/23   Josette Ade, MD  polyethylene glycol (MIRALAX  / GLYCOLAX ) 17 g packet Take 17 g by mouth daily. 09/01/23   Sherrill Cable Latif, DO  sodium chloride  flush (NS) 0.9 % SOLN 10-40 mLs by Intracatheter route every 12 (twelve) hours. 10/29/23   Josette Ade, MD  TRELEGY RUFINO 200-62.5-25 MCG/ACT AEPB Inhale 1 puff into the lungs daily. 07/17/23   [provider]     Allergies: Losartan, Chicken allergy, and Poultry meal   Review of Systems   ROS as per HPI  Physical Exam Updated Vital Signs BP 103/72 (BP Location: Right Arm)   Pulse 84   Temp 97.7 F (36.5 C) (Oral)   Resp 18   SpO2 100%  Physical Exam Vitals and nursing note reviewed.  Constitutional:      General: He is not in acute distress.    Appearance: He is well-developed.  HENT:     Head: Normocephalic and atraumatic.  Eyes:     Conjunctiva/sclera: Conjunctivae normal.  Cardiovascular:     Rate and Rhythm: Normal rate and regular rhythm.     Heart sounds: No murmur heard. Pulmonary:     Effort: Pulmonary effort is normal. No respiratory distress.     Breath sounds: Normal breath sounds.  Abdominal:     Palpations: Abdomen is soft.     Tenderness: There is abdominal tenderness (Moderate) in the epigastric area. There is guarding (Voluntary). There is no rebound. Negative signs include Murphy's sign and McBurney's sign.  Musculoskeletal:        General: No swelling.     Cervical back: Neck supple.  Skin:    General: Skin is warm and dry.     Capillary Refill: Capillary refill takes less than 2 seconds.  Neurological:     Mental Status: He is alert.  Psychiatric:        Mood and Affect: Mood normal.     ED Course/ Medical Decision Making/ A&P    Procedures Procedures   Medications Ordered  in ED Medications  pantoprazole  (PROTONIX ) injection 40 mg (40 mg Intravenous Given 12/05/23 0909)  famotidine  (PEPCID ) IVPB 20 mg premix (0 mg Intravenous Stopped 12/05/23 0912)  alum & mag hydroxide-simeth (MAALOX/MYLANTA) 200-200-20 MG/5ML suspension 30 mL (30 mLs Oral Given 12/05/23 0908)    And  lidocaine  (XYLOCAINE ) 2 % viscous mouth solution 15 mL (15 mLs Oral Given 12/05/23 0909)  tiZANidine (ZANAFLEX) tablet 2 mg (2 mg Oral Given 12/05/23 1204)  acetaminophen  (TYLENOL ) tablet 1,000 mg (1,000 mg Oral Given 12/05/23 1204)  iohexol  (OMNIPAQUE ) 350 MG/ML injection 75 mL (75 mLs Intravenous Contrast Given 12/05/23 1148)  doxycycline  (VIBRA -TABS) tablet 100 mg (100 mg Oral Given 12/05/23 1351)    Medical Decision Making:  TERESO UNANGST is a 78 y.o. male who presents for chest pain and abdominal pain as per above.  Physical exam is pertinent for moderate epigastric tenderness to palpation.   The differential includes but is not limited to GERD, bowel obstruction, recurrence of malignancy, ACS, arrhythmia, pericardial tamponade, pericarditis, myocarditis, pneumonia, pneumothorax, esophageal, tear, perforated abdominal viscous, pulmonary embolism, aortic dissection, costochondritis, musculoskeletal chest wall pain, GERD.  Independent historian: None  External data reviewed: Labs: reviewed prior labs for baseline and Notes: Did review patient's recent discharge summary and outpatient notes, patient had recent leg infection from infected hardware, is currently on course of doxycycline  and ciprofloxacin as guided by infectious disease  Labs: Ordered, Independent interpretation, and Details: UA without UTI.  CBC with mild nonspecific leukocytosis to 11.2, stable anemia, no thrombocytopenia.  UA without UTI. Initial and delta troponin 7.  Lipase 20.  CMP with mild creatinine  elevation, at baseline for patient.  D-dimer elevated at 1.23.  Radiology: Ordered, Independent interpretation,  Details: CT PE study without large central PE or dense airspace consolidation.  CT of the abdomen pelvis without obstructive bowel gas pattern, mass lesion, free air, significant free fluid., and All images reviewed independently.  Agree with radiology report at this time.   CT Angio Chest PE W and/or Wo Contrast Result Date: 12/05/2023 EXAM: CTA of the Chest with contrast for PE 12/05/2023 11:47:00 AM TECHNIQUE: CTA of the chest was performed after the administration of 75 mL iohexol  (OMNIPAQUE ) 350 MG/ML injection. Multiplanar reformatted images are provided for review. MIP images are provided for review. Automated exposure control, iterative reconstruction, and/or weight based adjustment of the mA/kV was utilized to reduce the radiation dose to as low as reasonably achievable. COMPARISON: None available. CLINICAL HISTORY: Pulmonary embolism (PE) suspected, low to intermediate prob, positive D-dimer. FINDINGS: PULMONARY ARTERIES: Pulmonary arteries are adequately opacified for evaluation. No evidence of acute pulmonary embolism. Main pulmonary artery is normal in caliber. MEDIASTINUM: The heart and pericardium demonstrate no acute abnormality. There is no acute abnormality of the thoracic aorta. LYMPH NODES: No mediastinal, hilar or axillary lymphadenopathy. LUNGS AND PLEURA: Peribronchial thickening and mild interstitial thickening within the left lower lobe. Findings suggestive of bronchitis or aspiration pneumonitis. Small nodule in the left lower lobe measures 4 mm on image 93 of series 4. Subtle ground glass opacities in the upper lobes suggest mild edema. Nodule on the right horizontal fissure measures 4 mm. No focal consolidation. No pleural effusion or pneumothorax. UPPER ABDOMEN: Multiple gallstones within the gallbladder. SOFT TISSUES AND BONES: No acute bone or soft tissue abnormality. IMPRESSION: 1. No evidence of acute pulmonary embolism. 2. Peribronchial thickening and mild interstitial thickening  within the left lower lobe, suggestive of bronchitis or aspiration pneumonitis. 3. Subtle ground glass opacities in the upper lobes, suggestive of mild edema. 4. Multiple small solid pulmonary nodules measuring up to 45 mm. No routine follow-up imaging is recommended per Fleischner Society Guidelines for nodules of this size in the absence of high-risk features or risk factors; if high risk, optional non-contrast chest CT at 12 months. Electronically signed by: Norleen Boxer MD 12/05/2023 01:32 PM EDT RP Workstation: [id number]   CT ABDOMEN PELVIS W CONTRAST Result Date: 12/05/2023 CLINICAL DATA:  Epigastric and abdominal pain. Previous gastric and colon carcinoma. EXAM: CT ABDOMEN AND PELVIS WITH CONTRAST TECHNIQUE: Multidetector CT imaging of the abdomen and pelvis was performed using the standard protocol following bolus administration of intravenous contrast. RADIATION DOSE REDUCTION: This exam was performed according to the  departmental dose-optimization program which includes automated exposure control, adjustment of the mA and/or kV according to patient size and/or use of iterative reconstruction technique. CONTRAST:  75mL OMNIPAQUE  IOHEXOL  350 MG/ML SOLN COMPARISON:  08/23/2023 FINDINGS: Lower Chest: Left lower lobe atelectasis or infiltrate. See separate chest CTA report from earlier today. Hepatobiliary: No suspicious hepatic masses identified. Numerous tiny gallstones are seen, without signs of cholecystitis or biliary ductal dilatation. Pancreas:  No mass or inflammatory changes. Spleen: Within normal limits in size and appearance. Adrenals/Urinary Tract: No suspicious masses identified. No evidence of ureteral calculi or hydronephrosis. Stomach/Bowel: Postop changes seen from prior Billroth 2 procedure. Prior left colon resection also noted. No abnormal soft tissue masses are identified. No No evidence of inflammatory process or abnormal fluid collections. Large stool burden noted throughout the  colon. Vascular/Lymphatic: No pathologically enlarged lymph nodes. No acute vascular findings. Reproductive:  No mass or other significant abnormality. Other:  None. Musculoskeletal:  No suspicious bone lesions identified. IMPRESSION: No acute findings.  No evidence of recurrent or metastatic disease. Cholelithiasis. No radiographic evidence of cholecystitis. Large stool burden noted; recommend clinical correlation for possible constipation. Electronically Signed   By: Norleen DELENA Kil M.D.   On: 12/05/2023 12:29    EKG/Medicine tests: Ordered and Independent interpretation EKG Interpretation Date/Time:  Friday December 05 2023 08:20:09 EDT Ventricular Rate:  98 PR Interval:  113 QRS Duration:  106 QT Interval:  340 QTC Calculation: 435 R Axis:   67  Text Interpretation: Sinus rhythm Borderline short PR interval Borderline low voltage, extremity leads No significant change since last tracing Confirmed by Rogelia Satterfield ([id number]) on 12/05/2023 8:38:54 AM  Interventions: Protonix , Maalox, Pepcid , home medication  See the EMR for full details regarding lab and imaging results.  Patient presents for abdominal pain radiating to his epigastrium.  Does have numerous risk factors, however is not having any symptoms of bowel obstruction such as persistent nausea, vomiting, though patient does have focal tenderness to palpation.  Given pain radiating from epigastrium to chest, I do have a suspicion for reflux contributing to the patient's presentation, therefore will treat with Pepcid , Protonix , Maalox.  However also feel that patient warrants evaluation for cardiac etiology of chest pain given radiation of symptoms to this area.  The ECG reveals no anatomical ischemia representing STEMI, New-Onset Arrhythmia, or ischemic equivalent.  He has been risk stratified with a HEAR score of 4. Initial troponin is 7; delta troponin is 7.  The patient's presentation, the patient being hemodynamically stable, and the  ECG are not consistent with Pericardial Tamponade. The patient's pain is not positional. This in conjunction with the lack of PR depressions and ST elevations on the ECG are reassuring against Pericarditis. The patient's non-elevated troponin and ECG are also inconsistent with Myocarditis.  There is no evidence of Pneumothorax on physical exam or on the CT. CT shows no evidence of Esophageal Tear and there is no recent intractable emesis or esophageal instrumentation. There is no peritonitis or free air on CT worrisome for a Perforated Abdominal Viscous.  The patient's pain is not tearing and it does not radiate to back. Pulses are present bilaterally in both the upper and lower extremities. CT does not show a widened mediastinum. I have a very low suspicion for Aortic Dissection.  Pulmonary Embolism is on the differential. The patient is at  risk via the Revised Geneva Criteria. Therefore, we will further risk stratify the patient with a d-dimer.  This was elevated. Therefore, a CTA was obtained.  This does not demonstrate acute PE, however does demonstrate evidence of pneumonitis versus bronchitis versus early developing pneumonia.  Patient is already on ciprofloxacin and doxycycline  for recent hardware infection, therefore will prescribe cefdinir.  Patient is hemodynamically stable, well-appearing, no increased respiratory effort.  Patient has 1 point of elevation on curb 65 score for age of 32, however given curb 65 score of only 1, feel that patient is stable for outpatient management.  Patient had persistent concern for mesh complication or return of malignancy, therefore do feel that CT of the abdomen pelvis is reasonable given patient only had partial improvement with treatment for.  CT of the abdomen pelvis obtained and does not reveal acute intra-abdominal pathology.  Discussed this findings with patient at bedside and he expressed understanding, amenable to outpatient management.  Of note, patient  did tell CT tech that he was going to shoot himself with a gun.  Patient reevaluated, patient states that he said this in the context of wanting to be released from the emergency department, and then that if you do not let me out of here soon I may need to shoot myself, patient reports that this was a joke, denies SI, HI, AVH.  Discussed with patient why this is not an appropriate joke.  Patient able to contract for safety, therefore do not feel that patient requires further psychiatric evaluation.  Presentation is most consistent with acute complicated illness  Discussion of management or test interpretations with external provider(s): Not indicated  Risk Drugs:OTC drugs and Prescription drug management  Disposition: DISCHARGE: I believe that the patient is safe for discharge home with outpatient follow-up. Patient was informed of all pertinent physical exam, laboratory, and imaging findings.  Patient's suspected etiology of their symptom presentation was discussed with the patient and all questions were answered. We discussed following up with PCP. I provided thorough ED return precautions. The patient feels safe and comfortable with this plan.  MDM generated using voice dictation software and may contain dictation errors.  Please contact me for any clarification or with any questions.  Clinical Impression:  1. Epigastric pain   2. Chest pain, unspecified type      Discharge   Final Clinical Impression(s) / ED Diagnoses Final diagnoses:  Epigastric pain  Chest pain, unspecified type    Rx / DC Orders ED Discharge Orders          Ordered    pantoprazole  (PROTONIX ) 20 MG tablet  Daily        12/05/23 1353    cefdinir (OMNICEF) 300 MG capsule  2 times daily        12/05/23 1353             Rogelia Jerilynn RAMAN, MD 12/06/23 [phone]

## 2023-12-05 NOTE — ED Notes (Signed)
 [facility], pt reports he started having chest pain last night and describes it as sharp. Pt denies any nausea, vomiting, loose stools. Pt reports he quit smoking and drinking 5 years ago.

## 2023-12-10 ENCOUNTER — Other Ambulatory Visit: Payer: Self-pay

## 2023-12-10 ENCOUNTER — Ambulatory Visit (INDEPENDENT_AMBULATORY_CARE_PROVIDER_SITE_OTHER): Admitting: Internal Medicine

## 2023-12-10 VITALS — BP 106/65 | HR 88 | Temp 97.9°F

## 2023-12-10 DIAGNOSIS — T847XXD Infection and inflammatory reaction due to other internal orthopedic prosthetic devices, implants and grafts, subsequent encounter: Secondary | ICD-10-CM | POA: Diagnosis not present

## 2023-12-10 DIAGNOSIS — N183 Chronic kidney disease, stage 3 unspecified: Secondary | ICD-10-CM | POA: Diagnosis not present

## 2023-12-10 DIAGNOSIS — F17210 Nicotine dependence, cigarettes, uncomplicated: Secondary | ICD-10-CM

## 2023-12-10 NOTE — Progress Notes (Signed)
 Patient: Joe Coleman  DOB: 1945-12-12 MRN: [id number] PCP: [facility]    Patient Active Problem List   Diagnosis Date Noted   Hardware complicating wound infection 10/29/2023   Uncontrolled type 2 diabetes mellitus with hyperglycemia, without long-term current use of insulin  (HCC) 10/29/2023   Type 2 diabetes mellitus with peripheral neuropathy (HCC) 10/28/2023   GERD without esophagitis 10/28/2023   Chronic obstructive pulmonary disease (COPD) (HCC) 10/28/2023   Cellulitis of right leg 10/28/2023   Rhabdomyolysis 10/28/2023   Pressure injury of skin 10/28/2023   History of DVT (deep vein thrombosis) 10/28/2023   Normocytic anemia 10/28/2023   Acute metabolic encephalopathy 10/27/2023   Acute kidney injury superimposed on chronic kidney disease 10/27/2023   Open knee wound, right, sequela 10/14/2023   Malnutrition of moderate degree 08/30/2023   Tibia/fibula fracture 08/24/2023   Cellulitis of right arm 08/10/2023   Eye pain, bilateral 07/26/2022   Fall 07/26/2022   Lymphopenia 07/26/2022   Shortness of breath 05/21/2022   COVID-19 virus infection 03/17/2020   COPD with acute exacerbation (HCC) 03/17/2020   Type 2 diabetes mellitus (HCC) 03/17/2020   HTN (hypertension) 03/17/2020   Vitamin B12 deficiency 10/05/2019   Erectile dysfunction 11/10/2017   Chronic kidney disease (CKD), stage III (moderate) (HCC) 09/19/2017   Mild intermittent asthma without complication 09/19/2017   Chronic cough 10/22/2016   Epigastric pain 06/30/2015   Chronic bilateral low back pain without sciatica 05/31/2015   Neuropathic pain 03/01/2015   Benign prostatic hyperplasia with lower urinary tract symptoms 04/06/2014   Adenomatous colon polyp 08/31/2013   Depression 06/21/2013   Foot pain, bilateral 05/07/2013   Insomnia 03/24/2013   Vitiligo 12/23/2012   Primary localized osteoarthrosis, lower leg 01/15/2011   Pain medication agreement broken 09/19/2010   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  Or cultures grew Klebsiella and Enterobacter cloacae.  Discharged on daptomycin  cefepime  x 6 weeks from OR EOT 10/21.  He was readmitted on 9/24 due to altered mental status and elevated CK.  Antibiotic regimen was changed to linezolid  x 2 weeks then Doxy to complete course as well as ertapenem  till 1021t 11/12/23: notes pt was seen by ortho yesterday. Took stiches out, redressed 11/25/23: doing well. No new complaints 12/10/23: tolerating abx. Reports wound is starting to improve Review of Systems  All other systems reviewed and are negative.   Past Medical History:  Diagnosis Date   Cancer (HCC)    stomach and colon   Diabetes mellitus without complication (HCC)    Type II   Hypertension     Outpatient Medications Prior to Visit  Medication Sig Dispense Refill   acetaminophen  (TYLENOL ) 325 MG tablet Take 2 tablets (650 mg total) by mouth every 6 (six) hours as needed for mild pain (pain score 1-3) or fever (or Fever >/= 101).     albuterol  (PROVENTIL  HFA;VENTOLIN  HFA) 108 (90 Base) MCG/ACT inhaler Inhale 2 puffs into the lungs every 6 (six) hours as needed for wheezing or shortness of breath. 1 Inhaler 2   apixaban  (ELIQUIS ) 5  MG TABS tablet Take 2 tablets (10 mg total) by mouth 2 (two) times daily for 1 day, THEN 1 tablet (5 mg total) 2 (two) times daily.     bisacodyl  (DULCOLAX) 5 MG EC tablet Take 1 tablet (5 mg total) by mouth daily as needed for moderate constipation. 30 tablet 0    cefdinir (OMNICEF) 300 MG capsule Take 1 capsule (300 mg total) by mouth 2 (two) times daily for 7 days. 14 capsule 0   Chlorhexidine  Gluconate Cloth 2 % PADS Apply 6 each topically daily.     cholecalciferol  (CHOLECALCIFEROL ) 25 MCG tablet Take 1 tablet (1,000 Units total) by mouth daily.     cyanocobalamin  (VITAMIN B12) 1000 MCG tablet Take 1 tablet (1,000 mcg total) by mouth daily. 120 tablet 0   feeding supplement, GLUCERNA SHAKE, (GLUCERNA SHAKE) LIQD Take 237 mLs by mouth 3 (three) times daily between meals.     ferrous sulfate  325 (65 FE) MG tablet Take 1 tablet (325 mg total) by mouth daily with breakfast. 30 tablet 0   gabapentin  (NEURONTIN ) 100 MG capsule Take 1 capsule (100 mg total) by mouth at bedtime.     gabapentin  (NEURONTIN ) 300 MG capsule Take 300 mg by mouth at bedtime.     guaiFENesin  (MUCINEX ) 600 MG 12 hr tablet Take 1 tablet (600 mg total) by mouth 2 (two) times daily. 60 tablet 0   methocarbamol  (ROBAXIN ) 500 MG tablet Take 1 tablet (500 mg total) by mouth every 6 (six) hours as needed for muscle spasms. 30 tablet 0   ondansetron  (ZOFRAN ) 4 MG tablet Take 1 tablet (4 mg total) by mouth every 6 (six) hours as needed for nausea.     pantoprazole  (PROTONIX ) 20 MG tablet Take 2 tablets (40 mg total) by mouth daily for 14 days. 28 tablet 0   polyethylene glycol (MIRALAX  / GLYCOLAX ) 17 g packet Take 17 g by mouth daily.     sodium chloride  flush (NS) 0.9 % SOLN 10-40 mLs by Intracatheter route every 12 (twelve) hours.     TRELEGY ELLIPTA 200-62.5-25 MCG/ACT AEPB Inhale 1 puff into the lungs daily.     No facility-administered medications prior to visit.     Allergies  Allergen Reactions   Losartan Other (See Comments)    Hyperkalemia (>5.5 multiple times on losartan 25mg )   Chicken Allergy Nausea And Vomiting        Poultry Meal Nausea And Vomiting    Social History   Tobacco Use   Smoking status: Former   Smokeless tobacco: Never  Vaping Use   Vaping status:  Never Used  Substance Use Topics   Alcohol use: No   Drug use: No    Family History  Problem Relation Age of Onset   Heart disease Mother    Heart disease Brother     Objective:  There were no vitals filed for this visit. There is no height or weight on file to calculate BMI.  Physical Exam Constitutional:      General: He is not in acute distress.    Appearance: He is normal weight. He is not toxic-appearing.  HENT:     Head: Normocephalic and atraumatic.     Right Ear: External ear normal.     Left Ear: External ear normal.     Nose: No congestion or rhinorrhea.     Mouth/Throat:     Mouth: Mucous membranes are moist.     Pharynx: Oropharynx is clear.  Eyes:  Extraocular Movements: Extraocular movements intact.     Conjunctiva/sclera: Conjunctivae normal.     Pupils: Pupils are equal, round, and reactive to light.  Cardiovascular:     Rate and Rhythm: Normal rate and regular rhythm.     Heart sounds: No murmur heard.    No friction rub. No gallop.  Pulmonary:     Effort: Pulmonary effort is normal.     Breath sounds: Normal breath sounds.  Abdominal:     General: Abdomen is flat. Bowel sounds are normal.     Palpations: Abdomen is soft.  Musculoskeletal:     Cervical back: Normal range of motion and neck supple.  Skin:    General: Skin is warm and dry.  Neurological:     General: No focal deficit present.     Mental Status: He is oriented to person, place, and time.  Psychiatric:        Mood and Affect: Mood normal.        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  Initially discharged on cefepime  and daptomycin .  He was readmitted 9/24 for altered mental status crease CPK.  Transition to ertapenem  and linezolid  plan to complete antibiotic course with doxycycline . Plan: -Continue  doxy and  and add ciproloxicn(renally doses mg q 24h)  -labs today -Please bring ortho notes ot visit.  -f/u in 2 weeks as wound is till a bit erythematous but improved. Pending clinical progression, consider stopping abx at that point   Loney Stank, MD [facility]   12/10/23  9:56 AM I have personally spent 41 minutes involved in face-to-face and non-face-to-face activities for this patient on the day of the visit. Professional time spent includes the following activities: Preparing to see the patient (review of tests), Obtaining and/or reviewing separately obtained history (admission/discharge record), Performing a medically appropriate examination and/or evaluation , Ordering medications/tests/procedures, referring and communicating with other health care professionals, Documenting clinical information in the EMR, Independently interpreting results (not separately reported), Communicating results to the patient/family/caregiver, Counseling and educating the patient/family/caregiver and Care coordination (not separately reported).

## 2023-12-10 NOTE — Patient Instructions (Signed)
 PLEASE SEND ORTHO NOTES We did labs today Stop doxy and ciprofloxacin F/U in one month

## 2023-12-11 LAB — COMPLETE METABOLIC PANEL WITHOUT GFR
AG Ratio: 1.4 (calc) (ref 1.0–2.5)
ALT: 23 U/L (ref 9–46)
AST: 38 U/L — ABNORMAL HIGH (ref 10–35)
Albumin: 3.7 g/dL (ref 3.6–5.1)
Alkaline phosphatase (APISO): 167 U/L — ABNORMAL HIGH (ref 35–144)
BUN/Creatinine Ratio: 12 (calc) (ref 6–22)
BUN: 22 mg/dL (ref 7–25)
CO2: 26 mmol/L (ref 20–32)
Calcium: 8.9 mg/dL (ref 8.6–10.3)
Chloride: 103 mmol/L (ref 98–110)
Creat: 1.83 mg/dL — ABNORMAL HIGH (ref 0.70–1.22)
Globulin: 2.7 g/dL (ref 1.9–3.7)
Glucose, Bld: 224 mg/dL — ABNORMAL HIGH (ref 65–99)
Potassium: 6.1 mmol/L — ABNORMAL HIGH (ref 3.5–5.3)
Sodium: 136 mmol/L (ref 135–146)
Total Bilirubin: 0.4 mg/dL (ref 0.2–1.2)
Total Protein: 6.4 g/dL (ref 6.1–8.1)

## 2023-12-11 LAB — CBC WITH DIFFERENTIAL/PLATELET
Absolute Lymphocytes: 1372 {cells}/uL (ref 850–3900)
Absolute Monocytes: 788 {cells}/uL (ref 200–950)
Basophils Absolute: 54 {cells}/uL (ref 0–200)
Basophils Relative: 0.5 %
Eosinophils Absolute: 270 {cells}/uL (ref 15–500)
Eosinophils Relative: 2.5 %
HCT: 33.8 % — ABNORMAL LOW (ref 38.5–50.0)
Hemoglobin: 10.2 g/dL — ABNORMAL LOW (ref 13.2–17.1)
MCH: 27.6 pg (ref 27.0–33.0)
MCHC: 30.2 g/dL — ABNORMAL LOW (ref 32.0–36.0)
MCV: 91.4 fL (ref 80.0–100.0)
MPV: 11.5 fL (ref 7.5–12.5)
Monocytes Relative: 7.3 %
Neutro Abs: 8316 {cells}/uL — ABNORMAL HIGH (ref 1500–7800)
Neutrophils Relative %: 77 %
Platelets: 301 Thousand/uL (ref 140–400)
RBC: 3.7 Million/uL — ABNORMAL LOW (ref 4.20–5.80)
RDW: 14.2 % (ref 11.0–15.0)
Total Lymphocyte: 12.7 %
WBC: 10.8 Thousand/uL (ref 3.8–10.8)

## 2023-12-11 LAB — SEDIMENTATION RATE: Sed Rate: 34 mm/h — ABNORMAL HIGH (ref 0–20)

## 2023-12-11 LAB — C-REACTIVE PROTEIN: CRP: 16.6 mg/L — ABNORMAL HIGH (ref <8.0)

## 2023-12-17 ENCOUNTER — Ambulatory Visit
Admission: RE | Admit: 2023-12-17 | Discharge: 2023-12-17 | Disposition: A | Discharge status: Home / Self Care | Source: Ambulatory Visit | Attending: Pulmonary Disease | Admitting: Pulmonary Disease

## 2023-12-17 DIAGNOSIS — J189 Pneumonia, unspecified organism: Secondary | ICD-10-CM | POA: Diagnosis not present

## 2023-12-17 DIAGNOSIS — R911 Solitary pulmonary nodule: Secondary | ICD-10-CM | POA: Insufficient documentation

## 2023-12-17 DIAGNOSIS — A419 Sepsis, unspecified organism: Secondary | ICD-10-CM | POA: Diagnosis not present

## 2023-12-17 DIAGNOSIS — R053 Chronic cough: Secondary | ICD-10-CM | POA: Insufficient documentation

## 2023-12-18 ENCOUNTER — Other Ambulatory Visit: Payer: Self-pay | Admitting: Student

## 2023-12-18 DIAGNOSIS — Z8781 Personal history of (healed) traumatic fracture: Secondary | ICD-10-CM

## 2023-12-20 ENCOUNTER — Encounter: Payer: Self-pay | Admitting: Emergency Medicine

## 2023-12-20 ENCOUNTER — Other Ambulatory Visit: Payer: Self-pay

## 2023-12-20 ENCOUNTER — Inpatient Hospital Stay
Admission: EM | Admit: 2023-12-20 | Discharge: 2023-12-22 | DRG: 871 | Disposition: A | Discharge status: Home / Self Care | Attending: Internal Medicine | Admitting: Internal Medicine

## 2023-12-20 ENCOUNTER — Emergency Department

## 2023-12-20 DIAGNOSIS — Z7901 Long term (current) use of anticoagulants: Secondary | ICD-10-CM | POA: Diagnosis not present

## 2023-12-20 DIAGNOSIS — J440 Chronic obstructive pulmonary disease with acute lower respiratory infection: Secondary | ICD-10-CM | POA: Diagnosis present

## 2023-12-20 DIAGNOSIS — N1832 Chronic kidney disease, stage 3b: Secondary | ICD-10-CM | POA: Diagnosis present

## 2023-12-20 DIAGNOSIS — N401 Enlarged prostate with lower urinary tract symptoms: Secondary | ICD-10-CM | POA: Diagnosis present

## 2023-12-20 DIAGNOSIS — Z87891 Personal history of nicotine dependence: Secondary | ICD-10-CM

## 2023-12-20 DIAGNOSIS — E875 Hyperkalemia: Secondary | ICD-10-CM | POA: Diagnosis present

## 2023-12-20 DIAGNOSIS — K219 Gastro-esophageal reflux disease without esophagitis: Secondary | ICD-10-CM | POA: Diagnosis present

## 2023-12-20 DIAGNOSIS — E1142 Type 2 diabetes mellitus with diabetic polyneuropathy: Secondary | ICD-10-CM | POA: Diagnosis present

## 2023-12-20 DIAGNOSIS — E872 Acidosis, unspecified: Secondary | ICD-10-CM | POA: Diagnosis present

## 2023-12-20 DIAGNOSIS — Z86718 Personal history of other venous thrombosis and embolism: Secondary | ICD-10-CM | POA: Diagnosis not present

## 2023-12-20 DIAGNOSIS — Z79899 Other long term (current) drug therapy: Secondary | ICD-10-CM

## 2023-12-20 DIAGNOSIS — R5381 Other malaise: Secondary | ICD-10-CM | POA: Diagnosis present

## 2023-12-20 DIAGNOSIS — J441 Chronic obstructive pulmonary disease with (acute) exacerbation: Secondary | ICD-10-CM | POA: Diagnosis present

## 2023-12-20 DIAGNOSIS — Z794 Long term (current) use of insulin: Secondary | ICD-10-CM | POA: Diagnosis not present

## 2023-12-20 DIAGNOSIS — E1122 Type 2 diabetes mellitus with diabetic chronic kidney disease: Secondary | ICD-10-CM | POA: Diagnosis present

## 2023-12-20 DIAGNOSIS — J690 Pneumonitis due to inhalation of food and vomit: Secondary | ICD-10-CM | POA: Diagnosis present

## 2023-12-20 DIAGNOSIS — Z888 Allergy status to other drugs, medicaments and biological substances status: Secondary | ICD-10-CM

## 2023-12-20 DIAGNOSIS — Z85038 Personal history of other malignant neoplasm of large intestine: Secondary | ICD-10-CM

## 2023-12-20 DIAGNOSIS — I129 Hypertensive chronic kidney disease with stage 1 through stage 4 chronic kidney disease, or unspecified chronic kidney disease: Secondary | ICD-10-CM | POA: Diagnosis present

## 2023-12-20 DIAGNOSIS — A419 Sepsis, unspecified organism: Secondary | ICD-10-CM | POA: Diagnosis present

## 2023-12-20 DIAGNOSIS — R338 Other retention of urine: Secondary | ICD-10-CM | POA: Diagnosis present

## 2023-12-20 DIAGNOSIS — J189 Pneumonia, unspecified organism: Secondary | ICD-10-CM | POA: Diagnosis present

## 2023-12-20 DIAGNOSIS — Z8249 Family history of ischemic heart disease and other diseases of the circulatory system: Secondary | ICD-10-CM

## 2023-12-20 DIAGNOSIS — R6520 Severe sepsis without septic shock: Secondary | ICD-10-CM | POA: Diagnosis present

## 2023-12-20 DIAGNOSIS — Z91018 Allergy to other foods: Secondary | ICD-10-CM | POA: Diagnosis not present

## 2023-12-20 LAB — COMPREHENSIVE METABOLIC PANEL WITH GFR
ALT: 8 U/L (ref 0–44)
AST: 21 U/L (ref 15–41)
Albumin: 3.5 g/dL (ref 3.5–5.0)
Alkaline Phosphatase: 131 U/L — ABNORMAL HIGH (ref 38–126)
Anion gap: 11 (ref 5–15)
BUN: 23 mg/dL (ref 8–23)
CO2: 20 mmol/L — ABNORMAL LOW (ref 22–32)
Calcium: 8.9 mg/dL (ref 8.9–10.3)
Chloride: 106 mmol/L (ref 98–111)
Creatinine, Ser: 1.89 mg/dL — ABNORMAL HIGH (ref 0.61–1.24)
GFR, Estimated: 35 mL/min — ABNORMAL LOW (ref >60)
Glucose, Bld: 173 mg/dL — ABNORMAL HIGH (ref 70–99)
Potassium: 5.6 mmol/L — ABNORMAL HIGH (ref 3.5–5.1)
Sodium: 136 mmol/L (ref 135–145)
Total Bilirubin: 0.3 mg/dL (ref 0.0–1.2)
Total Protein: 6.6 g/dL (ref 6.5–8.1)

## 2023-12-20 LAB — CBC WITH DIFFERENTIAL/PLATELET
Abs Immature Granulocytes: 0.04 K/uL (ref 0.00–0.07)
Basophils Absolute: 0.1 K/uL (ref 0.0–0.1)
Basophils Relative: 1 %
Eosinophils Absolute: 0.2 K/uL (ref 0.0–0.5)
Eosinophils Relative: 2 %
HCT: 30.4 % — ABNORMAL LOW (ref 39.0–52.0)
Hemoglobin: 9.2 g/dL — ABNORMAL LOW (ref 13.0–17.0)
Immature Granulocytes: 0 %
Lymphocytes Relative: 12 %
Lymphs Abs: 1.1 K/uL (ref 0.7–4.0)
MCH: 28.1 pg (ref 26.0–34.0)
MCHC: 30.3 g/dL (ref 30.0–36.0)
MCV: 93 fL (ref 80.0–100.0)
Monocytes Absolute: 0.8 K/uL (ref 0.1–1.0)
Monocytes Relative: 9 %
Neutro Abs: 7 K/uL (ref 1.7–7.7)
Neutrophils Relative %: 76 %
Platelets: 286 K/uL (ref 150–400)
RBC: 3.27 MIL/uL — ABNORMAL LOW (ref 4.22–5.81)
RDW: 17.1 % — ABNORMAL HIGH (ref 11.5–15.5)
WBC: 9.2 K/uL (ref 4.0–10.5)
nRBC: 0 % (ref 0.0–0.2)

## 2023-12-20 LAB — TROPONIN T, HIGH SENSITIVITY
Troponin T High Sensitivity: 53 ng/L — ABNORMAL HIGH (ref 0–19)
Troponin T High Sensitivity: 43 ng/L — ABNORMAL HIGH (ref 0–19)

## 2023-12-20 LAB — MAGNESIUM: Magnesium: 2 mg/dL (ref 1.7–2.4)

## 2023-12-20 LAB — LACTIC ACID, PLASMA: Lactic Acid, Venous: 3.1 mmol/L (ref 0.5–1.9)

## 2023-12-20 LAB — PRO BRAIN NATRIURETIC PEPTIDE: Pro Brain Natriuretic Peptide: 502 pg/mL — ABNORMAL HIGH (ref <300.0)

## 2023-12-20 MED ORDER — PIPERACILLIN-TAZOBACTAM 3.375 G IVPB 30 MIN
3.3750 g | Freq: Once | INTRAVENOUS | Status: AC
  Administered 2023-12-20: 3.375 g via INTRAVENOUS
  Filled 2023-12-20: qty 50

## 2023-12-20 MED ORDER — BISACODYL 5 MG PO TBEC: 5.0000 mg | DELAYED_RELEASE_TABLET | Freq: Every day | ORAL | Status: DC | PRN

## 2023-12-20 MED ORDER — GABAPENTIN 100 MG PO CAPS: 100.0000 mg | ORAL_CAPSULE | Freq: Every day | ORAL | Status: DC

## 2023-12-20 MED ORDER — TRAZODONE HCL 50 MG PO TABS
25.0000 mg | ORAL_TABLET | Freq: Every evening | ORAL | Status: DC | PRN
  Administered 2023-12-21 – 2023-12-22 (×2): 25 mg via ORAL
  Filled 2023-12-20 (×2): qty 1

## 2023-12-20 MED ORDER — IPRATROPIUM-ALBUTEROL 0.5-2.5 (3) MG/3ML IN SOLN
6.0000 mL | Freq: Once | RESPIRATORY_TRACT | Status: AC
  Administered 2023-12-20: 6 mL via RESPIRATORY_TRACT
  Filled 2023-12-20: qty 6

## 2023-12-20 MED ORDER — SODIUM CHLORIDE 0.9% FLUSH
10.0000 mL | Freq: Two times a day (BID) | INTRAVENOUS | Status: DC
  Administered 2023-12-21 – 2023-12-22 (×3): 10 mL

## 2023-12-20 MED ORDER — PANTOPRAZOLE SODIUM 40 MG PO TBEC
40.0000 mg | DELAYED_RELEASE_TABLET | Freq: Every day | ORAL | Status: DC
  Administered 2023-12-21 – 2023-12-22 (×2): 40 mg via ORAL
  Filled 2023-12-20 (×2): qty 1

## 2023-12-20 MED ORDER — FERROUS SULFATE 325 (65 FE) MG PO TABS
325.0000 mg | ORAL_TABLET | Freq: Every day | ORAL | Status: DC
  Administered 2023-12-21 – 2023-12-22 (×2): 325 mg via ORAL
  Filled 2023-12-20 (×2): qty 1

## 2023-12-20 MED ORDER — IOHEXOL 350 MG/ML SOLN
60.0000 mL | Freq: Once | INTRAVENOUS | Status: AC | PRN
  Administered 2023-12-20: 60 mL via INTRAVENOUS

## 2023-12-20 MED ORDER — ONDANSETRON HCL 4 MG/2ML IJ SOLN: 4.0000 mg | Freq: Four times a day (QID) | INTRAMUSCULAR | Status: DC | PRN

## 2023-12-20 MED ORDER — METHOCARBAMOL 500 MG PO TABS
500.0000 mg | ORAL_TABLET | Freq: Four times a day (QID) | ORAL | Status: DC | PRN
Start: 2023-12-20 — End: 2023-12-22
  Administered 2023-12-21 – 2023-12-22 (×3): 500 mg via ORAL
  Filled 2023-12-20 (×3): qty 1

## 2023-12-20 MED ORDER — SODIUM CHLORIDE 0.9 % IV SOLN
3.0000 g | Freq: Two times a day (BID) | INTRAVENOUS | Status: DC
  Administered 2023-12-21 – 2023-12-22 (×3): 3 g via INTRAVENOUS
  Filled 2023-12-20 (×4): qty 8

## 2023-12-20 MED ORDER — VITAMIN D 25 MCG (1000 UNIT) PO TABS
1000.0000 [IU] | ORAL_TABLET | Freq: Every day | ORAL | Status: DC
  Administered 2023-12-21 – 2023-12-22 (×2): 1000 [IU] via ORAL
  Filled 2023-12-20 (×2): qty 1

## 2023-12-20 MED ORDER — CHLORHEXIDINE GLUCONATE CLOTH 2 % EX PADS: 6.0000 | MEDICATED_PAD | Freq: Every day | CUTANEOUS | Status: DC

## 2023-12-20 MED ORDER — ACETAMINOPHEN 325 MG PO TABS
650.0000 mg | ORAL_TABLET | Freq: Four times a day (QID) | ORAL | Status: DC | PRN
  Administered 2023-12-22: 650 mg via ORAL
  Filled 2023-12-20: qty 2

## 2023-12-20 MED ORDER — GABAPENTIN 300 MG PO CAPS
300.0000 mg | ORAL_CAPSULE | Freq: Every day | ORAL | Status: DC
  Administered 2023-12-21 (×2): 300 mg via ORAL
  Filled 2023-12-20 (×2): qty 1

## 2023-12-20 MED ORDER — MAGNESIUM HYDROXIDE 400 MG/5ML PO SUSP: 30.0000 mL | Freq: Every day | ORAL | Status: DC | PRN

## 2023-12-20 MED ORDER — ONDANSETRON HCL 4 MG PO TABS: 4.0000 mg | ORAL_TABLET | Freq: Four times a day (QID) | ORAL | Status: DC | PRN

## 2023-12-20 MED ORDER — GLUCERNA SHAKE PO LIQD: 237.0000 mL | Freq: Three times a day (TID) | ORAL | Status: DC

## 2023-12-20 MED ORDER — VANCOMYCIN HCL 1250 MG/250ML IV SOLN
1250.0000 mg | Freq: Once | INTRAVENOUS | Status: AC
  Administered 2023-12-20: 1250 mg via INTRAVENOUS
  Filled 2023-12-20: qty 250

## 2023-12-20 MED ORDER — GUAIFENESIN ER 600 MG PO TB12
600.0000 mg | ORAL_TABLET | Freq: Two times a day (BID) | ORAL | Status: DC
  Administered 2023-12-21 – 2023-12-22 (×4): 600 mg via ORAL
  Filled 2023-12-20 (×4): qty 1

## 2023-12-20 MED ORDER — METHYLPREDNISOLONE SODIUM SUCC 125 MG IJ SOLR
125.0000 mg | Freq: Once | INTRAMUSCULAR | Status: AC
  Administered 2023-12-20: 125 mg via INTRAVENOUS
  Filled 2023-12-20: qty 2

## 2023-12-20 MED ORDER — SODIUM CHLORIDE 0.9 % IV SOLN
500.0000 mg | INTRAVENOUS | Status: DC
  Administered 2023-12-21 (×2): 500 mg via INTRAVENOUS
  Filled 2023-12-20 (×2): qty 5

## 2023-12-20 MED ORDER — VITAMIN B-12 1000 MCG PO TABS
1000.0000 ug | ORAL_TABLET | Freq: Every day | ORAL | Status: DC
  Administered 2023-12-21 – 2023-12-22 (×2): 1000 ug via ORAL
  Filled 2023-12-20 (×2): qty 1

## 2023-12-20 MED ORDER — LACTATED RINGERS IV SOLN
150.0000 mL/h | INTRAVENOUS | Status: DC
  Administered 2023-12-21: 150 mL/h via INTRAVENOUS

## 2023-12-20 MED ORDER — MORPHINE SULFATE (PF) 4 MG/ML IV SOLN
4.0000 mg | Freq: Once | INTRAVENOUS | Status: AC
  Administered 2023-12-20: 4 mg via INTRAVENOUS
  Filled 2023-12-20: qty 1

## 2023-12-20 MED ORDER — ACETAMINOPHEN 650 MG RE SUPP: 650.0000 mg | Freq: Four times a day (QID) | RECTAL | Status: DC | PRN

## 2023-12-20 MED ORDER — APIXABAN 2.5 MG PO TABS
2.5000 mg | ORAL_TABLET | Freq: Two times a day (BID) | ORAL | Status: DC
  Administered 2023-12-21: 2.5 mg via ORAL
  Filled 2023-12-20: qty 1

## 2023-12-20 NOTE — H&P (Signed)
 [place]   PATIENT NAME: Joe Coleman    MR#:  [id number]  DATE OF BIRTH:  Jan 12, 1946  DATE OF ADMISSION:  12/20/2023  PRIMARY CARE PHYSICIAN: [facility]   Patient is coming from: Home  REQUESTING/REFERRING PHYSICIAN: Claudene Rover, MD  CHIEF COMPLAINT:   Chief Complaint  Patient presents with   Shortness of Breath    PT to ER from home for C/O increased shortness of breath and cough    HISTORY OF PRESENT ILLNESS:  Joe Coleman is a 78 y.o. 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  He denies any fever or chills.  No nausea or vomiting or abdominal pain.  No chest pain or palpitations.  He was seen by his PCP 4 days ago for.  He had ORIF of right tibial fracture in July 2025.  He had a prior TKA and subsequent wound dehiscence and hardware removal.  There is cultures were positive for Klebsiella and Enterobacter and he has been on linezolid  line for 2 weeks and then doxycycline .  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  There is a question of compliance with Eliquis .  ED Course: When he came to the ER, BP was 179/81 with otherwise normal vital signs.  Labs revealed a potassium of 5.6 and a CO2 of 20 with a glucose of 173 and creatinine 1.89, alk phos 131 with otherwise unremarkable CMP.  His proBNP was 502. EKG as reviewed by me : EKG showed normal sinus rhythm with a rate of 95. Imaging: Portable chest x-ray showed following: 1. Mild left infrahilar atelectasis and/or infiltrate. 2. Ill-defined focal opacity overlying the upper right lung which may be related to adjacent overlying cardiac lead. Follow-up chest plain film is recommended  if clinical symptoms persist to exclude an area of early focal airspace disease.  The patient was given IV Zosyn and vancomycin  in the ER as well as 4 mg of IV morphine  sulfate, 125 mg of IV Solu-Medrol  and DuoNeb.  He will be admitted to a medical telemetry bed for further evaluation and management. PAST MEDICAL HISTORY:   Past Medical History:  Diagnosis Date   Cancer (HCC)    stomach and colon   Diabetes mellitus without complication (HCC)    Type II   Hypertension     PAST SURGICAL HISTORY:   Past Surgical History:  Procedure Laterality Date   APPENDECTOMY     COLON SURGERY     HARDWARE REMOVAL Right 10/15/2023   Procedure: REMOVAL, HARDWARE;  Surgeon: [name] Franky SQUIBB, MD;  Location: [facility] OR;  Service: Orthopedics;  Laterality: Right;  IRRIGATION AND DEBRIDEMENT, HARDWARE REMOVAL   ORIF TIBIA FRACTURE Right 08/25/2023   Procedure: OPEN REDUCTION INTERNAL FIXATION (ORIF) TIBIA FRACTURE, RIGHT;  Surgeon: [name] Franky SQUIBB, MD;  Location: [facility] OR;  Service: Orthopedics;  Laterality: Right;    SOCIAL HISTORY:   Social History   Tobacco Use   Smoking status: Former   Smokeless tobacco: Never  Substance Use Topics   Alcohol use: No    FAMILY HISTORY:   Family History  Problem Relation Age of Onset   Heart disease Mother    Heart disease Brother  DRUG ALLERGIES:   Allergies  Allergen Reactions   Losartan Other (See Comments)    Hyperkalemia (>5.5 multiple times on losartan 25mg )   Chicken Allergy Nausea And Vomiting   Poultry Meal Nausea And Vomiting    REVIEW OF SYSTEMS:   ROS As per history of present illness. All pertinent systems were reviewed above. Constitutional, HEENT, cardiovascular, respiratory, GI, GU, musculoskeletal, neuro, psychiatric, endocrine, integumentary and hematologic systems were reviewed and are otherwise negative/unremarkable except for positive findings mentioned above in the HPI.   MEDICATIONS AT HOME:   Prior to Admission medications    Medication Sig Start Date End Date Taking? Authorizing Provider  acetaminophen  (TYLENOL ) 325 MG tablet Take 2 tablets (650 mg total) by mouth every 6 (six) hours as needed for mild pain (pain score 1-3) or fever (or Fever >/= 101). 10/29/23   Josette Ade, MD  albuterol  (PROVENTIL  HFA;VENTOLIN  HFA) 108 (90 Base) MCG/ACT inhaler Inhale 2 puffs into the lungs every 6 (six) hours as needed for wheezing or shortness of breath. 04/01/16   Lang Dover, MD  apixaban  (ELIQUIS ) 5 MG TABS tablet Take 2 tablets (10 mg total) by mouth 2 (two) times daily for 1 day, THEN 1 tablet (5 mg total) 2 (two) times daily. 10/29/23 11/28/23  Josette Ade, MD  bisacodyl  (DULCOLAX) 5 MG EC tablet Take 1 tablet (5 mg total) by mouth daily as needed for moderate constipation. 10/24/23   Patel, Pranav M, MD  Chlorhexidine  Gluconate Cloth 2 % PADS Apply 6 each topically daily. 10/30/23   Josette Ade, MD  cholecalciferol  (CHOLECALCIFEROL ) 25 MCG tablet Take 1 tablet (1,000 Units total) by mouth daily. 08/28/23   Danton Lauraine LABOR, PA-C  cyanocobalamin  (VITAMIN B12) 1000 MCG tablet Take 1 tablet (1,000 mcg total) by mouth daily. 10/24/23   Tobie Yetta HERO, MD  feeding supplement, GLUCERNA SHAKE, (GLUCERNA SHAKE) LIQD Take 237 mLs by mouth 3 (three) times daily between meals. 09/01/23   Sherrill Cable Latif, DO  ferrous sulfate  325 (65 FE) MG tablet Take 1 tablet (325 mg total) by mouth daily with breakfast. 10/25/23   Tobie Yetta HERO, MD  gabapentin  (NEURONTIN ) 100 MG capsule Take 1 capsule (100 mg total) by mouth at bedtime. 10/29/23   Josette Ade, MD  gabapentin  (NEURONTIN ) 300 MG capsule Take 300 mg by mouth at bedtime.    [provider]  guaiFENesin  (MUCINEX ) 600 MG 12 hr tablet Take 1 tablet (600 mg total) by mouth 2 (two) times daily. 10/24/23 10/23/24  Patel, Pranav M, MD  methocarbamol  (ROBAXIN ) 500 MG tablet Take 1 tablet (500 mg total) by mouth every 6 (six) hours as needed for muscle spasms. 10/24/23    Tobie Yetta HERO, MD  ondansetron  (ZOFRAN ) 4 MG tablet Take 1 tablet (4 mg total) by mouth every 6 (six) hours as needed for nausea. 10/29/23   Josette Ade, MD  pantoprazole  (PROTONIX ) 20 MG tablet Take 2 tablets (40 mg total) by mouth daily for 14 days. 12/05/23 12/19/23  Rogelia Jerilynn RAMAN, MD  polyethylene glycol (MIRALAX  / GLYCOLAX ) 17 g packet Take 17 g by mouth daily. 09/01/23   Sherrill Cable Latif, DO  sodium chloride  flush (NS) 0.9 % SOLN 10-40 mLs by Intracatheter route every 12 (twelve) hours. 10/29/23   Josette Ade, MD  TRELEGY RUFINO 200-62.5-25 MCG/ACT AEPB Inhale 1 puff into the lungs daily. 07/17/23   [provider]      VITAL SIGNS:  Blood pressure 136/88, pulse (!) 107, temperature 97.9 F (36.6 C), temperature  source Oral, resp. rate 18, height 5' 8 (1.727 m), weight 59 kg, SpO2 100%.  PHYSICAL EXAMINATION:  Physical Exam  GENERAL:  78 y.o.-year-old Caucasian male patient lying in the bed with no acute distress.  EYES: Pupils equal, round, reactive to light and accommodation. No scleral icterus. Extraocular muscles intact.  HEENT: Head atraumatic, normocephalic. Oropharynx and nasopharynx clear.  NECK:  Supple, no jugular venous distention. No thyroid enlargement, no tenderness.  LUNGS: Diffuse expiratory wheezes with diminished expiratory airflow and harsh vesicular breathing and left basal crackles.. No use of accessory muscles of respiration.  CARDIOVASCULAR: Regular rate and rhythm, S1, S2 normal. No murmurs, rubs, or gallops.  ABDOMEN: Soft, nondistended, nontender. Bowel sounds present. No organomegaly or mass.  EXTREMITIES: No pedal edema, cyanosis, or clubbing.  NEUROLOGIC: Cranial nerves II through XII are intact. Muscle strength 5/5 in all extremities. Sensation intact. Gait not checked.  PSYCHIATRIC: The patient is alert and oriented x 3.  Normal affect and good eye contact. SKIN: No obvious rash, lesion, or ulcer.   LABORATORY PANEL:    CBC Recent Labs  Lab 12/20/23 1850  WBC 9.2  HGB 9.2*  HCT 30.4*  PLT 286   ------------------------------------------------------------------------------------------------------------------  Chemistries  Recent Labs  Lab 12/20/23 1850  NA 136  K 5.6*  CL 106  CO2 20*  GLUCOSE 173*  BUN 23  CREATININE 1.89*  CALCIUM 8.9  MG 2.0  AST 21  ALT 8  ALKPHOS 131*  BILITOT 0.3   ------------------------------------------------------------------------------------------------------------------  Cardiac Enzymes No results for input(s): TROPONINI in the last 168 hours. ------------------------------------------------------------------------------------------------------------------  RADIOLOGY:  CT Angio Chest PE W and/or Wo Contrast Result Date: 12/20/2023 EXAM: CTA CHEST 12/20/2023 09:08:00 PM TECHNIQUE: CTA of the chest was performed without and with the administration of 60 mL of iohexol  (OMNIPAQUE ) 350 MG/ML injection. Multiplanar reformatted images are provided for review. MIP images are provided for review. Automated exposure control, iterative reconstruction, and/or weight based adjustment of the mA/kV was utilized to reduce the radiation dose to as low as reasonably achievable. COMPARISON: Portable chest from 12/20/2023, recent chest CT without contrast 12/17/2023, CTA chest 12/05/2023, chest CT without contrast 08/23/2023. CLINICAL HISTORY: acute SOB, noncompliant eliquis , known DVT. eval PE FINDINGS: PULMONARY ARTERIES: Pulmonary arteries are normal in caliber. No arterial embolism is seen to the segmental level, but the subsegmental arteries are poorly opacified and largely obscured due to respiratory motion. A subsegmental embolus could be missed. MEDIASTINUM: The heart is normal in size. There are mild scattered 3-vessel coronary calcific plaques. No pericardial effusion. There is mild aortic and great vessel atherosclerosis without aneurysm, dissection or stenosis. LYMPH  NODES: There are small subcentimeter right hilar and AP window lymph nodes but no enlarged thoracic or axillary nodes. LUNGS AND PLEURA: There is mild centrilobular emphysematous change. There is mild reticulated scarring in the lung apices. There are thickened bronchi again in both lower lobes. There is layering fluid in the left greater than right main bronchi with fluid extending into the left lower lobe main bronchus and the basal directed segmental and scattered subsegmental and downstream bronchi. This was seen to varying degrees on all three prior CT exams. Favor chronic aspiration etiology. Left lower lobe volume loss is again noted. There is increased airspace disease in the posterior left lower lobe base likely indicating aspiration pneumonia. No other focal infiltrate is seen. There is a chronic 7 mm fissural nodule in the right lower lung field on series 6, image 100. This is benign and stable back  to 2022. A 3 mm left lower lobe nodule is again noted on image 110. 5 mm perifissural right lower lobe nodule on image 88 is also stable. There is a 3 mm subpleural nodule on image 94, first seen on 12/05/2023. The lungs are otherwise clear. No evidence of pleural effusion or pneumothorax. UPPER ABDOMEN: In the abdomen, the gallbladder is packed with small stones without wall thickening. There are old postsurgical changes at the esophagogastric junction. Chronic antral gastrectomy. SOFT TISSUES AND BONES: There is osteopenia and mild dextroscoliosis and degenerative change of the thoracic spine with diffuse degenerative disc disease. No chest wall mass is seen. No acute bone or soft tissue abnormality. IMPRESSION: 1. No evidence of pulmonary embolism to the segmental level; subsegmental arteries are suboptimally evaluated due to motion, and a small subsegmental embolus could be missed. 2. Increased posterior left lower lobe airspace disease, likely aspiration pneumonia. 3. Increased fluid in the left greater  than right main bronchi and left lower lobe bronchi, seen to varying degrees on prior studies. Probable chronic aspiration. 4. Aortic and coronary atherosclerosis. 5. Cholelithiasis. Electronically signed by: Francis Quam MD 12/20/2023 09:39 PM EST RP Workstation: [id number]   DG Chest Portable 1 View Result Date: 12/20/2023 CLINICAL DATA:  History of COPD presenting with shortness of breath. EXAM: PORTABLE CHEST 1 VIEW COMPARISON:  October 22, 2023 FINDINGS: The heart size and mediastinal contours are within normal limits. Mild left infrahilar atelectasis and/or infiltrate is seen. A small, ill-defined focal opacity is seen overlying the upper right lung, adjacent to and existing cardiac lead. This is not seen on the prior study and has no corresponding abnormality on the most recent chest CT (dated December 17, 2023). No pleural effusion or pneumothorax is identified. Small radiopaque surgical clips are seen overlying the expected region of the gastroesophageal junction. The visualized skeletal structures are unremarkable. IMPRESSION: 1. Mild left infrahilar atelectasis and/or infiltrate. 2. Ill-defined focal opacity overlying the upper right lung which may be related to adjacent overlying cardiac lead. Follow-up chest plain film is recommended if clinical symptoms persist to exclude an area of early focal airspace disease. Electronically Signed   By: Suzen Dials M.D.   On: 12/20/2023 19:28      IMPRESSION AND PLAN:  Assessment and Plan: * Sepsis due to pneumonia [facility]) - The patient will be admitted to a medical telemetry bed. - This is likely secondary to aspiration pneumonia. - Will continue antibiotic therapy with IV Unasyn to cover anaerobes and Zithromax. - Mucolytic therapy be provided as well as duo nebs q.i.d. and q.4 hours p.r.n. - We will follow blood cultures.   Aspiration pneumonia (HCC) - Management as above.  COPD with acute exacerbation (HCC) - Will continue antibiotic  therapy as mentioned above. - May be placed on scheduled and as needed DuoNebs. - Mucolytic therapy will be provided. - O2 protocol before.  Hyperkalemia - He will be given Lokelma  and potassium will be followed.  Type 2 diabetes mellitus with peripheral neuropathy (HCC) - The patient will be placed on supplemental coverage with NovoLog . - Will hold off metformin . - Will continue Neurontin .  History of deep vein thrombosis (DVT) of lower extremity - Will continue Eliquis .   DVT prophylaxis: Eliquis . Advanced Care Planning:  Code Status: full code. Family Communication:  The plan of care was discussed in details with the patient (and family). I answered all questions. The patient agreed to proceed with the above mentioned plan. Further management will depend upon hospital course. Disposition  Plan: Back to previous home environment Consults called: none.  All the records are reviewed and case discussed with ED provider.  Status is: Inpatient    At the time of the admission, it appears that the appropriate admission status for this patient is inpatient.  This is judged to be reasonable and necessary in order to provide the required intensity of service to ensure the patient's safety given the presenting symptoms, physical exam findings and initial radiographic and laboratory data in the context of comorbid conditions.  The patient requires inpatient status due to high intensity of service, high risk of further deterioration and high frequency of surveillance required.  I certify that at the time of admission, it is my clinical judgment that the patient will require inpatient hospital care extending more than 2 midnights.                            Dispo: The patient is from: Home              Anticipated d/c is to: Home              Patient currently is not medically stable to d/c.              Difficult to place patient: No  [place] DELENA Peaches M.D on 12/21/2023 at 4:38 AM  [facility]   From 7 PM-7 AM, contact night-coverage [email or web address]  CC: Primary care physician; [employer]

## 2023-12-20 NOTE — ED Notes (Signed)
 Patient has a ready room

## 2023-12-20 NOTE — ED Notes (Signed)
 Patient taken to CT scan.

## 2023-12-20 NOTE — ED Provider Notes (Signed)
 [facility] Provider Note    Event Date/Time   First MD Initiated Contact with Patient 12/20/23 1837     (approximate)   History   Shortness of Breath (PT to ER from home for C/O increased shortness of breath and cough)   HPI  Joe Coleman is a 78 y.o. male who presents to the ED for evaluation of Shortness of Breath (PT to ER from home for C/O increased shortness of breath and cough)   Review of routine PCP visit from 4 days ago.  History of COPD, CKD, DM.  Right tibial wound infection and exposed hardware.  Original ORIF July 2025, prior TKA, subsequent wound dehiscence, hardware removal.  Cultures with Klebsiella and Enterobacter, on linezolid  for 2 weeks then Doxy.  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  Patient presents to the ED for evaluation of 15-30 minutes of shortness of breath.  Reports increased cough with increased sputum production  Patient reports that he does not think he is taking a blood thinner at home and the only pills he is taking is a pain pill.   Physical Exam   Triage Vital Signs: ED Triage Vitals  Encounter Vitals Group     BP      Girls Systolic BP Percentile      Girls Diastolic BP Percentile      Boys Systolic BP Percentile      Boys Diastolic BP Percentile      Pulse      Resp      Temp      Temp src      SpO2      Weight      Height      Head Circumference      Peak Flow      Pain Score      Pain Loc      Pain Education      Exclude from Growth Chart     Most recent vital signs: Vitals:   12/20/23 2000 12/20/23 2115  BP: (!) 143/66 130/62  Pulse:  (!) 114  Resp: (!) 28 18  Temp:    SpO2:  100%    General: Awake, no distress.  CV:  Good peripheral perfusion.  Resp:  Mild tachypnea, no distress, wheezing throughout with slightly decreased airflow..  Abd:  No distention.  MSK:  No deformity noted.  Neuro:  No focal deficits appreciated. Other:     ED Results / Procedures / Treatments   Labs (all  labs ordered are listed, but only abnormal results are displayed) Labs Reviewed  COMPREHENSIVE METABOLIC PANEL WITH GFR - Abnormal; Notable for the following components:      Result Value   Potassium 5.6 (*)    CO2 20 (*)    Glucose, Bld 173 (*)    Creatinine, Ser 1.89 (*)    Alkaline Phosphatase 131 (*)    GFR, Estimated 35 (*)    All other components within normal limits  CBC WITH DIFFERENTIAL/PLATELET - Abnormal; Notable for the following components:   RBC 3.27 (*)    Hemoglobin 9.2 (*)    HCT 30.4 (*)    RDW 17.1 (*)    All other components within normal limits  PRO BRAIN NATRIURETIC PEPTIDE - Abnormal; Notable for the following components:   Pro Brain Natriuretic Peptide 502.0 (*)    All other components within normal limits  TROPONIN T, HIGH SENSITIVITY - Abnormal; Notable for the following components:   Troponin  T High Sensitivity 53 (*)    All other components within normal limits  CULTURE, BLOOD (ROUTINE X 2)  CULTURE, BLOOD (ROUTINE X 2)  MAGNESIUM   LACTIC ACID, PLASMA  LACTIC ACID, PLASMA  TROPONIN T, HIGH SENSITIVITY    EKG Sinus rhythm with a rate of 95 bpm.  Normal axis and intervals.  No acute signs of acute ischemia.  RADIOLOGY CXR interpreted by me without evidence of acute cardiopulmonary pathology. CTA chest interpreted by me without PE  Official radiology report(s): CT Angio Chest PE W and/or Wo Contrast Result Date: 12/20/2023 EXAM: CTA CHEST 12/20/2023 09:08:00 PM TECHNIQUE: CTA of the chest was performed without and with the administration of 60 mL of iohexol  (OMNIPAQUE ) 350 MG/ML injection. Multiplanar reformatted images are provided for review. MIP images are provided for review. Automated exposure control, iterative reconstruction, and/or weight based adjustment of the mA/kV was utilized to reduce the radiation dose to as low as reasonably achievable. COMPARISON: Portable chest from 12/20/2023, recent chest CT without contrast 12/17/2023, CTA chest  12/05/2023, chest CT without contrast 08/23/2023. CLINICAL HISTORY: acute SOB, noncompliant eliquis , known DVT. eval PE FINDINGS: PULMONARY ARTERIES: Pulmonary arteries are normal in caliber. No arterial embolism is seen to the segmental level, but the subsegmental arteries are poorly opacified and largely obscured due to respiratory motion. A subsegmental embolus could be missed. MEDIASTINUM: The heart is normal in size. There are mild scattered 3-vessel coronary calcific plaques. No pericardial effusion. There is mild aortic and great vessel atherosclerosis without aneurysm, dissection or stenosis. LYMPH NODES: There are small subcentimeter right hilar and AP window lymph nodes but no enlarged thoracic or axillary nodes. LUNGS AND PLEURA: There is mild centrilobular emphysematous change. There is mild reticulated scarring in the lung apices. There are thickened bronchi again in both lower lobes. There is layering fluid in the left greater than right main bronchi with fluid extending into the left lower lobe main bronchus and the basal directed segmental and scattered subsegmental and downstream bronchi. This was seen to varying degrees on all three prior CT exams. Favor chronic aspiration etiology. Left lower lobe volume loss is again noted. There is increased airspace disease in the posterior left lower lobe base likely indicating aspiration pneumonia. No other focal infiltrate is seen. There is a chronic 7 mm fissural nodule in the right lower lung field on series 6, image 100. This is benign and stable back to 2022. A 3 mm left lower lobe nodule is again noted on image 110. 5 mm perifissural right lower lobe nodule on image 88 is also stable. There is a 3 mm subpleural nodule on image 94, first seen on 12/05/2023. The lungs are otherwise clear. No evidence of pleural effusion or pneumothorax. UPPER ABDOMEN: In the abdomen, the gallbladder is packed with small stones without wall thickening. There are old  postsurgical changes at the esophagogastric junction. Chronic antral gastrectomy. SOFT TISSUES AND BONES: There is osteopenia and mild dextroscoliosis and degenerative change of the thoracic spine with diffuse degenerative disc disease. No chest wall mass is seen. No acute bone or soft tissue abnormality. IMPRESSION: 1. No evidence of pulmonary embolism to the segmental level; subsegmental arteries are suboptimally evaluated due to motion, and a small subsegmental embolus could be missed. 2. Increased posterior left lower lobe airspace disease, likely aspiration pneumonia. 3. Increased fluid in the left greater than right main bronchi and left lower lobe bronchi, seen to varying degrees on prior studies. Probable chronic aspiration. 4. Aortic and coronary atherosclerosis.  5. Cholelithiasis. Electronically signed by: Francis Quam MD 12/20/2023 09:39 PM EST RP Workstation: [id number]   DG Chest Portable 1 View Result Date: 12/20/2023 CLINICAL DATA:  History of COPD presenting with shortness of breath. EXAM: PORTABLE CHEST 1 VIEW COMPARISON:  October 22, 2023 FINDINGS: The heart size and mediastinal contours are within normal limits. Mild left infrahilar atelectasis and/or infiltrate is seen. A small, ill-defined focal opacity is seen overlying the upper right lung, adjacent to and existing cardiac lead. This is not seen on the prior study and has no corresponding abnormality on the most recent chest CT (dated December 17, 2023). No pleural effusion or pneumothorax is identified. Small radiopaque surgical clips are seen overlying the expected region of the gastroesophageal junction. The visualized skeletal structures are unremarkable. IMPRESSION: 1. Mild left infrahilar atelectasis and/or infiltrate. 2. Ill-defined focal opacity overlying the upper right lung which may be related to adjacent overlying cardiac lead. Follow-up chest plain film is recommended if clinical symptoms persist to exclude an area of early  focal airspace disease. Electronically Signed   By: Suzen Dials M.D.   On: 12/20/2023 19:28    PROCEDURES and INTERVENTIONS:  .Critical Care  Performed by: Claudene Rover, MD Authorized by: Claudene Rover, MD   Critical care provider statement:    Critical care time (minutes):  30   Critical care time was exclusive of:  Separately billable procedures and treating other patients   Critical care was necessary to treat or prevent imminent or life-threatening deterioration of the following conditions:  Sepsis   Critical care was time spent personally by me on the following activities:  Development of treatment plan with patient or surrogate, discussions with consultants, evaluation of patient's response to treatment, examination of patient, ordering and review of laboratory studies, ordering and review of radiographic studies, ordering and performing treatments and interventions, pulse oximetry, re-evaluation of patient's condition and review of old charts .1-3 Lead EKG Interpretation  Performed by: Claudene Rover, MD Authorized by: Claudene Rover, MD     Interpretation: abnormal     ECG rate:  116   ECG rate assessment: tachycardic     Rhythm: sinus tachycardia     Ectopy: none     Conduction: normal     Medications  piperacillin-tazobactam (ZOSYN) IVPB 3.375 g (has no administration in time range)  vancomycin  (VANCOREADY) IVPB 2000 mg/400 mL (has no administration in time range)  ipratropium-albuterol  (DUONEB) 0.5-2.5 (3) MG/3ML nebulizer solution 6 mL (6 mLs Nebulization Given 12/20/23 1854)  methylPREDNISolone  sodium succinate (SOLU-MEDROL ) 125 mg/2 mL injection 125 mg (125 mg Intravenous Given 12/20/23 1855)  morphine  (PF) 4 MG/ML injection 4 mg (4 mg Intravenous Given 12/20/23 2115)  iohexol  (OMNIPAQUE ) 350 MG/ML injection 60 mL (60 mLs Intravenous Contrast Given 12/20/23 2043)     IMPRESSION / MDM / ASSESSMENT AND PLAN / ED COURSE  I reviewed the triage vital signs and the  nursing notes.  Differential diagnosis includes, but is not limited to, ACS, PTX, PNA, muscle strain/spasm, PE, dissection, anxiety, pleural effusion  {Patient presents with symptoms of an acute illness or injury that is potentially life-threatening.  Patient presents to the ED with an episode of shortness of breath likely multifactorial in the setting of a COPD exacerbation and pneumonia with SIRS criteria/sepsis requiring medical admission.  Vital signs worsened during stay in the ED with developing tachycardia and tachypnea.  His wheezing improves after breathing treatments and steroids.  CTA chest obtained due to his unreliable use of Eliquis  and history  of DVT, no signs of PE but does show signs of aspiration pneumonia.  Mildly elevated BNP without comparison.  Renal dysfunction near baseline, marginal hyperkalemia without EKG changes.  Will provide broad-spectrum antibiotics considering his recent extensive antibiotic regimen for his legs and outpatient consult medicine for admission      FINAL CLINICAL IMPRESSION(S) / ED DIAGNOSES   Final diagnoses:  Sepsis due to pneumonia [facility])     Rx / DC Orders   ED Discharge Orders     None        Note:  This document was prepared using Dragon voice recognition software and may include unintentional dictation errors.   Claudene Rover, MD 12/20/23 [phone]

## 2023-12-21 ENCOUNTER — Inpatient Hospital Stay

## 2023-12-21 DIAGNOSIS — R338 Other retention of urine: Secondary | ICD-10-CM | POA: Insufficient documentation

## 2023-12-21 DIAGNOSIS — A419 Sepsis, unspecified organism: Secondary | ICD-10-CM | POA: Diagnosis not present

## 2023-12-21 DIAGNOSIS — E875 Hyperkalemia: Secondary | ICD-10-CM

## 2023-12-21 DIAGNOSIS — Z86718 Personal history of other venous thrombosis and embolism: Secondary | ICD-10-CM

## 2023-12-21 DIAGNOSIS — J690 Pneumonitis due to inhalation of food and vomit: Secondary | ICD-10-CM | POA: Diagnosis not present

## 2023-12-21 DIAGNOSIS — J189 Pneumonia, unspecified organism: Secondary | ICD-10-CM | POA: Diagnosis not present

## 2023-12-21 DIAGNOSIS — J441 Chronic obstructive pulmonary disease with (acute) exacerbation: Secondary | ICD-10-CM | POA: Diagnosis not present

## 2023-12-21 LAB — BASIC METABOLIC PANEL WITH GFR
Anion gap: 12 (ref 5–15)
BUN: 24 mg/dL — ABNORMAL HIGH (ref 8–23)
CO2: 19 mmol/L — ABNORMAL LOW (ref 22–32)
Calcium: 8.6 mg/dL — ABNORMAL LOW (ref 8.9–10.3)
Chloride: 106 mmol/L (ref 98–111)
Creatinine, Ser: 1.79 mg/dL — ABNORMAL HIGH (ref 0.61–1.24)
GFR, Estimated: 38 mL/min — ABNORMAL LOW (ref >60)
Glucose, Bld: 278 mg/dL — ABNORMAL HIGH (ref 70–99)
Potassium: 5.8 mmol/L — ABNORMAL HIGH (ref 3.5–5.1)
Sodium: 137 mmol/L (ref 135–145)

## 2023-12-21 LAB — POTASSIUM
Potassium: 5.5 mmol/L — ABNORMAL HIGH (ref 3.5–5.1)
Potassium: 4.8 mmol/L (ref 3.5–5.1)

## 2023-12-21 LAB — GLUCOSE, CAPILLARY
Glucose-Capillary: 195 mg/dL — ABNORMAL HIGH (ref 70–99)
Glucose-Capillary: 249 mg/dL — ABNORMAL HIGH (ref 70–99)
Glucose-Capillary: 249 mg/dL — ABNORMAL HIGH (ref 70–99)
Glucose-Capillary: 269 mg/dL — ABNORMAL HIGH (ref 70–99)
Glucose-Capillary: 289 mg/dL — ABNORMAL HIGH (ref 70–99)

## 2023-12-21 LAB — CBC
HCT: 26.5 % — ABNORMAL LOW (ref 39.0–52.0)
Hemoglobin: 8.2 g/dL — ABNORMAL LOW (ref 13.0–17.0)
MCH: 28 pg (ref 26.0–34.0)
MCHC: 30.9 g/dL (ref 30.0–36.0)
MCV: 90.4 fL (ref 80.0–100.0)
Platelets: 226 K/uL (ref 150–400)
RBC: 2.93 MIL/uL — ABNORMAL LOW (ref 4.22–5.81)
RDW: 17.1 % — ABNORMAL HIGH (ref 11.5–15.5)
WBC: 7.4 K/uL (ref 4.0–10.5)
nRBC: 0 % (ref 0.0–0.2)

## 2023-12-21 LAB — CORTISOL-AM, BLOOD: Cortisol - AM: 4.1 ug/dL — ABNORMAL LOW (ref 6.7–22.6)

## 2023-12-21 LAB — LACTIC ACID, PLASMA: Lactic Acid, Venous: 1.5 mmol/L (ref 0.5–1.9)

## 2023-12-21 LAB — PROTIME-INR
INR: 1.2 (ref 0.8–1.2)
Prothrombin Time: 15.6 s — ABNORMAL HIGH (ref 11.4–15.2)

## 2023-12-21 MED ORDER — INSULIN ASPART 100 UNIT/ML IJ SOLN
0.0000 [IU] | Freq: Three times a day (TID) | INTRAMUSCULAR | Status: DC
  Administered 2023-12-21: 3 [IU] via SUBCUTANEOUS
  Administered 2023-12-21: 2 [IU] via SUBCUTANEOUS
  Administered 2023-12-22: 3 [IU] via SUBCUTANEOUS
  Administered 2023-12-22: 2 [IU] via SUBCUTANEOUS
  Filled 2023-12-21 (×2): qty 3
  Filled 2023-12-21 (×2): qty 2

## 2023-12-21 MED ORDER — DIPHENHYDRAMINE HCL 25 MG PO CAPS
25.0000 mg | ORAL_CAPSULE | Freq: Four times a day (QID) | ORAL | Status: DC | PRN
  Administered 2023-12-21 – 2023-12-22 (×2): 25 mg via ORAL
  Filled 2023-12-21 (×2): qty 1

## 2023-12-21 MED ORDER — HYDROCOD POLI-CHLORPHE POLI ER 10-8 MG/5ML PO SUER: 5.0000 mL | Freq: Two times a day (BID) | ORAL | Status: DC | PRN

## 2023-12-21 MED ORDER — SODIUM ZIRCONIUM CYCLOSILICATE 10 G PO PACK
10.0000 g | PACK | Freq: Once | ORAL | Status: AC
  Administered 2023-12-21: 10 g via ORAL
  Filled 2023-12-21 (×2): qty 1

## 2023-12-21 MED ORDER — APIXABAN 5 MG PO TABS
5.0000 mg | ORAL_TABLET | Freq: Two times a day (BID) | ORAL | Status: DC
  Administered 2023-12-21 – 2023-12-22 (×2): 5 mg via ORAL
  Filled 2023-12-21 (×2): qty 1

## 2023-12-21 MED ORDER — CALCIUM GLUCONATE-NACL 1-0.675 GM/50ML-% IV SOLN
1.0000 g | Freq: Once | INTRAVENOUS | Status: AC
  Administered 2023-12-21: 1000 mg via INTRAVENOUS
  Filled 2023-12-21: qty 50

## 2023-12-21 MED ORDER — METHYLPREDNISOLONE SODIUM SUCC 125 MG IJ SOLR
80.0000 mg | INTRAMUSCULAR | Status: DC
  Administered 2023-12-21 – 2023-12-22 (×2): 80 mg via INTRAVENOUS
  Filled 2023-12-21 (×2): qty 2

## 2023-12-21 MED ORDER — TAMSULOSIN HCL 0.4 MG PO CAPS
0.4000 mg | ORAL_CAPSULE | Freq: Every day | ORAL | Status: DC
  Administered 2023-12-21 – 2023-12-22 (×2): 0.4 mg via ORAL
  Filled 2023-12-21 (×2): qty 1

## 2023-12-21 MED ORDER — SODIUM BICARBONATE 8.4 % IV SOLN
50.0000 meq | Freq: Once | INTRAVENOUS | Status: AC
  Administered 2023-12-21: 50 meq via INTRAVENOUS
  Filled 2023-12-21: qty 50

## 2023-12-21 MED ORDER — IPRATROPIUM-ALBUTEROL 0.5-2.5 (3) MG/3ML IN SOLN: 3.0000 mL | RESPIRATORY_TRACT | Status: DC | PRN

## 2023-12-21 MED ORDER — INSULIN ASPART 100 UNIT/ML IJ SOLN
0.0000 [IU] | Freq: Every day | INTRAMUSCULAR | Status: DC
  Administered 2023-12-21: 3 [IU] via SUBCUTANEOUS
  Filled 2023-12-21: qty 3

## 2023-12-21 MED ORDER — IPRATROPIUM-ALBUTEROL 0.5-2.5 (3) MG/3ML IN SOLN
3.0000 mL | Freq: Four times a day (QID) | RESPIRATORY_TRACT | Status: DC
  Administered 2023-12-21: 3 mL via RESPIRATORY_TRACT
  Filled 2023-12-21: qty 3

## 2023-12-21 NOTE — Consult Note (Signed)
 WOC Nurse Consult Note: patient is familiar to [facility] team from previous admissions; hadORIF R tibia 08/2023; I&D and removal of hardware 10/15/2023; followed by infectious disease  Reason for Consult: R knee wounds  Wound type: full thickness infectious postop as above  Pressure Injury POA: NA  Measurement: see nursing flowsheet  Wound azi:ojuzmjo side of wound appears 50% red 50% tan fibrinous; medial side appears dry tan tissue  Drainage (amount, consistency, odor) see nursing flowsheet  Periwound: mild erythema  Dressing procedure/placement/frequency: Cleanse R lower leg wounds with Vashe wound cleanser, do not rinse, allow to air dry. Apply Xeroform gauze (Lawson [phone]) to wound beds daily and secure with silicone foam or dry gauze and Kerlix roll gauze whichever is preferred.   Patient should continue to follow with orthopedics and infectious disease as outpatient and resume their wound care orders.   POC discussed with bedside nurse. WOC team will not follow. Re-consult if further needs arise.   Thank you,    Powell Bar MSN, RN-BC, [employer]

## 2023-12-21 NOTE — Assessment & Plan Note (Signed)
-   The patient will be admitted to a medical telemetry bed. - This is likely secondary to aspiration pneumonia. - Will continue antibiotic therapy with IV Unasyn to cover anaerobes and Zithromax. - Mucolytic therapy be provided as well as duo nebs q.i.d. and q.4 hours p.r.n. - We will follow blood cultures.

## 2023-12-21 NOTE — Evaluation (Signed)
 Clinical/Bedside Swallow Evaluation Patient Details  Name: Joe Coleman MRN: [id number] Date of Birth: 1945/03/23  Today's Date: 12/21/2023 Time: SLP Start Time (ACUTE ONLY): 0950 SLP Stop Time (ACUTE ONLY): 1008 SLP Time Calculation (min) (ACUTE ONLY): 18 min  Past Medical History:  Past Medical History:  Diagnosis Date   Cancer (HCC)    stomach and colon   Diabetes mellitus without complication (HCC)    Type II   Hypertension    Past Surgical History:  Past Surgical History:  Procedure Laterality Date   APPENDECTOMY     COLON SURGERY     HARDWARE REMOVAL Right 10/15/2023   Procedure: REMOVAL, HARDWARE;  Surgeon: [name] Franky SQUIBB, MD;  Location: [facility] OR;  Service: Orthopedics;  Laterality: Right;  IRRIGATION AND DEBRIDEMENT, HARDWARE REMOVAL   ORIF TIBIA FRACTURE Right 08/25/2023   Procedure: OPEN REDUCTION INTERNAL FIXATION (ORIF) TIBIA FRACTURE, RIGHT;  Surgeon: [name] Franky SQUIBB, MD;  Location: [facility] OR;  Service: Orthopedics;  Laterality: Right;   HPI:  Joe Coleman is a 78 y.o. 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  He denies any fever or chills.  No nausea or vomiting or abdominal pain.  No chest pain or palpitations.  He was seen by his PCP 4 days ago for.  He had ORIF of right tibial fracture in July 2025.  He had a prior TKA and subsequent wound dehiscence and hardware removal.  There is cultures were positive for Klebsiella and Enterobacter and he has been on linezolid  line for 2 weeks and then doxycycline .  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  There is a question of compliance with Eliquis .   CT Angio chest revealed Increased posterior left lower lobe  airspace disease, likely aspiration  pneumonia.  3. Increased fluid in the left greater than right main bronchi and left lower  lobe bronchi, seen to varying degrees on prior studies. Probable chronic  aspiration.  Increased posterior left lower lobe airspace disease, likely aspiration  pneumonia.  3. Increased fluid in the left greater than right main bronchi and left lower  lobe bronchi, seen to varying degrees on prior studies. Probable chronic  aspiration.    Assessment / Plan / Recommendation  Clinical Impression  Pt presents with adequate oropharyngeal abilities when consuming his breakfast tray consisting of an egg omelet, fresh fruit and thin liquids via straw. Pt's nurse also reports no overt s/s of aspiration or dysphagia when pt consumed his medicine whole with thin liquids via straw this morning. Pt denies any coughing or reduced PO consumption prior to hospitalization. This clinical research associate reviewed aspiration precautions with pt who voiced understanding. At this time, diet modification doesn't appear indicated. ST services to sign off. SLP Visit Diagnosis: Dysphagia, unspecified (R13.10)    Aspiration Risk  No limitations    Diet Recommendation Regular;Thin liquid    Liquid Administration via: Cup;Straw Medication Administration: Whole meds with liquid Supervision: Patient able to self feed Compensations: Minimize environmental distractions;Slow rate;Small sips/bites Postural Changes: Seated upright at 90 degrees;Remain upright for at least 30 minutes after po intake    Other  Recommendations Oral Care Recommendations: Oral care BID        Functional Status Assessment Patient  has not had a recent decline in their functional status    Swallow Study   General Date of Onset: 12/20/23 HPI: Joe Coleman is a 78 y.o. Caucasian male with medical history significant for type diabetes mellitus and hypertension, history of left lower extremity DVT on Eliquis , and colon cancer status post  resection, who presented to the emergency room with acute onset of worsening dyspnea with associated cough productive of clear sputum as well as wheezing over the last few days.  He denies any fever or chills.  No nausea or vomiting or abdominal pain.  No chest pain or palpitations.  He was seen by his PCP 4 days ago for.  He had ORIF of right tibial fracture in July 2025.  He had a prior TKA and subsequent wound dehiscence and hardware removal.  There is cultures were positive for Klebsiella and Enterobacter and he has been on linezolid  line for 2 weeks and then doxycycline .  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  There is a question of compliance with Eliquis . CT Angio chest revealed Increased posterior left lower lobe airspace disease, likely aspiration  pneumonia.  3. Increased fluid in the left greater than right main bronchi and left lower  lobe bronchi, seen to varying degrees on prior studies. Probable chronic  aspiration.  Increased posterior left lower lobe airspace disease, likely aspiration  pneumonia.  3. Increased fluid in the left greater than right main bronchi and left lower  lobe bronchi, seen to varying degrees on prior studies. Probable chronic  aspiration. Type of Study: Bedside Swallow Evaluation Previous Swallow Assessment: none in chart Diet Prior to this Study: Regular;Thin liquids (Level 0) Temperature Spikes Noted: No Respiratory Status: Room air History of Recent Intubation: No Behavior/Cognition: Alert;Cooperative;Pleasant mood Oral Cavity Assessment: Within Functional Limits Oral Care Completed by SLP: No Vision: Functional for self-feeding Self-Feeding Abilities: Able to feed self Patient Positioning: Upright in bed Baseline Vocal Quality: Normal Volitional Cough: Strong Volitional Swallow: Able to elicit    Oral/Motor/Sensory Function Overall Oral Motor/Sensory Function: Within functional limits   Ice Chips Ice chips: Not tested   Thin Liquid Thin  Liquid: Within functional limits Presentation: Self Fed;Straw    Nectar Thick Nectar Thick Liquid: Not tested   Honey Thick Honey Thick Liquid: Not tested   Puree Puree: Within functional limits Presentation: Self Fed;Spoon   Solid     Solid: Within functional limits Presentation: Self Fed     Jaselyn Nahm B. Rubbie, M.S., CCC-SLP, Tree Surgeon Certified Brain Injury Specialist [facility]  [facility] Office [phone] Ascom [phone] Fax [phone]

## 2023-12-21 NOTE — Plan of Care (Signed)

## 2023-12-21 NOTE — Hospital Course (Signed)
 Joe Coleman is a 78 y.o. 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   Patient does not have hypoxemia, chest CT scan showed  Increased posterior left lower lobe airspace disease, likely aspiration pneumonia.Increased fluid in the left greater than right main bronchi and left lower lobe bronchi, seen to varying degrees on prior studies. Probable chronic aspiration. Patient treated with antibiotics, also obtain speech therapy evaluation.

## 2023-12-21 NOTE — Assessment & Plan Note (Signed)
-   He will be given Lokelma  and potassium will be followed.

## 2023-12-21 NOTE — Assessment & Plan Note (Signed)
-   The patient will be placed on supplemental coverage with NovoLog. - Will hold off metformin. - Will continue Neurontin.

## 2023-12-21 NOTE — Assessment & Plan Note (Signed)
 Management as above

## 2023-12-21 NOTE — Progress Notes (Signed)
   12/21/23 0000  Patient Belongings  Patient/Family advised about valuables policy? Yes  Home Medications No meds brought to hospital  Patient Belongings Kept at bedside;Sent to security  Belongings at Bedside Clothing;Electronic device(s);Jewelry;Purse/Wallet ($180 cash)  Bedside: Jewelry Watch  Bedside: Programme Researcher, Broadcasting/film/video) Cellphone  Belongings Sent to [employer] (locker 4; key in chart)

## 2023-12-21 NOTE — Progress Notes (Addendum)
 Noted that pt is having frequent urination w/ small output. MD Zhang notified, will continue to monitor.   Bladder scan at 1051 , MD aware.  1052 orders received for foley catheter.  1330 pt urinated , bladder scan revealed 14ml retention. Per MD, no need for foley catheter.

## 2023-12-21 NOTE — Progress Notes (Addendum)
 Progress Note   Patient: Joe Coleman FMW:[id number] DOB: 05-26-1945 DOA: 12/20/2023     1 DOS: the patient was seen and examined on 12/21/2023   Brief hospital course: Joe Coleman is a 78 y.o. 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   Patient does not have hypoxemia, chest CT scan showed  Increased posterior left lower lobe airspace disease, likely aspiration pneumonia.Increased fluid in the left greater than right main bronchi and left lower lobe bronchi, seen to varying degrees on prior studies. Probable chronic aspiration. Patient treated with antibiotics, also obtain speech therapy evaluation.   Principal Problem:   Sepsis due to pneumonia [facility]) Active Problems:   Aspiration pneumonia (HCC)   COPD with acute exacerbation (HCC)   Type 2 diabetes mellitus with peripheral neuropathy (HCC)   Hyperkalemia   CKD stage 3b, GFR 30-44 ml/min (HCC)   History of deep vein thrombosis (DVT) of lower extremity   Acute urinary retention   Assessment and Plan: *Severe sepsis due to pneumonia (HCC) Bilateral lower lobe aspiration pneumonia. COPD exacerbation. Patient had a significant tachycardia, tachypnea, severe lactic acidosis, consistent with severe sepsis.  CT scan also showed evidence of aspiration pneumonia.  Covered with antibiotics including Unasyn and Zithromax. Obtain speech therapy evaluation.  Chronic kidney disease stage IIIb Hyperkalemia Metabolic acidosis. Urinary retention secondary to benign prostate hypertrophy. Patient renal function appears to be stable, but developed significant hyperkalemia.  This is most likely due to urinary retention.  Bladder scan showed  residual more than 300 mL, patient was started on Flomax.  Patient was also given Lokelma , calcium IV, sodium bicarb IV.  Monitor potassium. Patient was able to urinate later, rescanned bladder with postvoid residual of 15 mL.  No need for Foley catheter.  Type 2 diabetes mellitus with peripheral neuropathy (HCC) Continue sliding scale insulin .  History of deep vein thrombosis (DVT) of lower extremity Patient still has significant right leg edema, obtain ultrasound to rule out continued DVT. Patient has been on Eliquis  at her 2.5 mg twice a day.  Will increase to 5 mg twice a day.  Debility. Patient has difficulty with walking at home, obtain PT eval.     Subjective:  Patient still has short of breath and a cough.   Physical Exam: Vitals:   12/21/23 0036 12/21/23 0511 12/21/23 0753 12/21/23 0805  BP: 136/88 112/75 (!) 148/87   Pulse: (!) 107 83 91   Resp:  18 16   Temp: 97.9 F (36.6 C) 98.9 F (37.2 C)    TempSrc: Oral Oral    SpO2: 100% 97% 100% 100%  Weight:      Height:       General exam: Appears calm and comfortable  Respiratory system: Decreased breath sounds. Respiratory effort normal. Cardiovascular system: S1 & S2 heard, RRR. No JVD, murmurs, rubs, gallops or clicks. Gastrointestinal system: Abdomen is nondistended, soft and nontender. No organomegaly or masses felt. Normal bowel sounds heard. Central nervous system: Alert and oriented. No focal neurological deficits. Extremities: Right leg 1+ edema with tenderness. Skin: No rashes, lesions or ulcers Psychiatry: Judgement and insight appear normal. Mood & affect appropriate.    Data Reviewed:  Reviewed CT scan results, lab results.  Family Communication:  None  Disposition: Status is: Inpatient Remains inpatient appropriate because: Severity of disease, IV treatment.     Time spent: 55 minutes  Author: Murvin Mana, MD 12/21/2023 11:02 AM  For on call review [email or web address].

## 2023-12-21 NOTE — Assessment & Plan Note (Signed)
Will continue Eliquis. 

## 2023-12-21 NOTE — Assessment & Plan Note (Signed)
-   Will continue antibiotic therapy as mentioned above. - May be placed on scheduled and as needed DuoNebs. - Mucolytic therapy will be provided. - O2 protocol before.

## 2023-12-22 ENCOUNTER — Ambulatory Visit: Admission: RE | Admit: 2023-12-22 | Source: Ambulatory Visit

## 2023-12-22 ENCOUNTER — Other Ambulatory Visit: Payer: Self-pay

## 2023-12-22 DIAGNOSIS — A419 Sepsis, unspecified organism: Secondary | ICD-10-CM | POA: Diagnosis not present

## 2023-12-22 DIAGNOSIS — J189 Pneumonia, unspecified organism: Secondary | ICD-10-CM | POA: Diagnosis not present

## 2023-12-22 DIAGNOSIS — J441 Chronic obstructive pulmonary disease with (acute) exacerbation: Secondary | ICD-10-CM | POA: Diagnosis not present

## 2023-12-22 DIAGNOSIS — J690 Pneumonitis due to inhalation of food and vomit: Secondary | ICD-10-CM | POA: Diagnosis not present

## 2023-12-22 LAB — GLUCOSE, CAPILLARY
Glucose-Capillary: 170 mg/dL — ABNORMAL HIGH (ref 70–99)
Glucose-Capillary: 241 mg/dL — ABNORMAL HIGH (ref 70–99)

## 2023-12-22 LAB — BASIC METABOLIC PANEL WITH GFR
Anion gap: 10 (ref 5–15)
BUN: 29 mg/dL — ABNORMAL HIGH (ref 8–23)
CO2: 20 mmol/L — ABNORMAL LOW (ref 22–32)
Calcium: 8.4 mg/dL — ABNORMAL LOW (ref 8.9–10.3)
Chloride: 108 mmol/L (ref 98–111)
Creatinine, Ser: 1.59 mg/dL — ABNORMAL HIGH (ref 0.61–1.24)
GFR, Estimated: 44 mL/min — ABNORMAL LOW (ref >60)
Glucose, Bld: 155 mg/dL — ABNORMAL HIGH (ref 70–99)
Potassium: 4.8 mmol/L (ref 3.5–5.1)
Sodium: 138 mmol/L (ref 135–145)

## 2023-12-22 MED ORDER — PREDNISONE 20 MG PO TABS
40.0000 mg | ORAL_TABLET | Freq: Every day | ORAL | 0 refills | Status: AC
  Filled 2023-12-22: qty 6, 3d supply, fill #0

## 2023-12-22 MED ORDER — AMOXICILLIN-POT CLAVULANATE 875-125 MG PO TABS
1.0000 | ORAL_TABLET | Freq: Two times a day (BID) | ORAL | 0 refills | Status: AC
  Filled 2023-12-22: qty 6, 3d supply, fill #0

## 2023-12-22 MED ORDER — TAMSULOSIN HCL 0.4 MG PO CAPS
0.4000 mg | ORAL_CAPSULE | Freq: Every day | ORAL | 0 refills | Status: AC
  Filled 2023-12-22: qty 30, 30d supply, fill #0

## 2023-12-22 MED ORDER — APIXABAN 5 MG PO TABS
5.0000 mg | ORAL_TABLET | Freq: Two times a day (BID) | ORAL | 0 refills | Status: AC
  Filled 2023-12-22: qty 60, 30d supply, fill #0

## 2023-12-22 MED ORDER — PREDNISONE 20 MG PO TABS
40.0000 mg | ORAL_TABLET | Freq: Every day | ORAL | Status: DC
  Administered 2023-12-22: 40 mg via ORAL
  Filled 2023-12-22: qty 2

## 2023-12-22 MED ORDER — OXYCODONE-ACETAMINOPHEN 5-325 MG PO TABS
1.0000 | ORAL_TABLET | Freq: Four times a day (QID) | ORAL | Status: DC | PRN
  Filled 2023-12-22: qty 1

## 2023-12-22 MED ORDER — MORPHINE SULFATE (PF) 2 MG/ML IV SOLN
2.0000 mg | INTRAVENOUS | Status: DC | PRN
  Administered 2023-12-22: 2 mg via INTRAVENOUS
  Filled 2023-12-22: qty 1

## 2023-12-22 NOTE — Progress Notes (Signed)
 All discharge requirements met.

## 2023-12-22 NOTE — TOC Transition Note (Signed)
 Transition of Care [facility]) - Discharge Note   Patient Details  Name: Joe Coleman MRN: [id number] Date of Birth: [birth year]  Transition of [facility]) CM/SW Contact:  Camie Hauss L Petros Ahart, LCSW Phone Number: 12/22/2023, 3:23 PM   Clinical Narrative:      Patient medically stable for discharge. [facility] was arranged for EMS transport.   No further TOC needs. TOC signing off.        Patient Goals and CMS Choice            Discharge Placement                       Discharge Plan and Services Additional resources added to the After Visit Summary for                                       Social Drivers of Health (SDOH) Interventions SDOH Screenings   Food Insecurity: No Food Insecurity (12/21/2023)  Housing: Low Risk  (12/21/2023)  Transportation Needs: No Transportation Needs (12/21/2023)  Utilities: Not At Risk (12/21/2023)  Financial Resource Strain: Medium Risk (05/02/2021)   Received from [facility]  Physical Activity: Inactive (08/25/2018)   Received from [facility]  Social Connections: Unknown (12/21/2023)  Stress: No Stress Concern Present (08/25/2018)   Received from [facility]  Tobacco Use: Medium Risk (12/20/2023)  Health Literacy: High Risk (05/04/2022)   Received from [facility]     Readmission Risk Interventions    09/01/2023   12:00 PM  Readmission Risk Prevention Plan  Transportation Screening Complete  Medication Review (RN Care Manager) Complete  PCP or Specialist appointment within 3-5 days of discharge Complete  HRI or Home Care Consult Complete  SW Recovery Care/Counseling Consult Complete  Palliative Care Screening Not Applicable  Skilled Nursing Facility Complete

## 2023-12-22 NOTE — Discharge Summary (Signed)
 Physician Discharge Summary   Patient: Joe Coleman MRN: [id number] DOB: 1945-08-03  Admit date:     12/20/2023  Discharge date: 12/22/23  Discharge Physician: Murvin Mana   PCP: [facility]   Recommendations at discharge:   Follow-up PCP in 1 week. Follow-up with [facility] orthopedics as previous scheduled. Continue home care RN for dressing changes.  Discharge Diagnoses: Principal Problem:   Sepsis due to pneumonia [facility]) Active Problems:   Aspiration pneumonia (HCC)   COPD with acute exacerbation (HCC)   Type 2 diabetes mellitus with peripheral neuropathy (HCC)   Hyperkalemia   CKD stage 3b, GFR 30-44 ml/min (HCC)   History of deep vein thrombosis (DVT) of lower extremity   Acute urinary retention  Resolved Problems:   * No resolved hospital problems. *  Hospital Course: Joe Coleman is a 78 y.o. 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   Patient does not have hypoxemia, chest CT scan showed  Increased posterior left lower lobe airspace disease, likely aspiration pneumonia.Increased fluid in the left greater than right main bronchi and left lower lobe bronchi, seen to varying degrees on prior studies. Probable chronic aspiration. Patient is seen by speech therapy, currently no evidence of aspiration.  Aspiration pneumonia could be from acid reflux. Condition has improved, medically stable for discharge.  Continue Augmentin  to complete total antibiotic course for 5 days.   Assessment and Plan: Severe sepsis due to pneumonia (HCC) Bilateral lower lobe aspiration pneumonia. COPD exacerbation. Patient had a significant tachycardia, tachypnea, severe  lactic acidosis, consistent with severe sepsis.  CT scan also showed evidence of aspiration pneumonia.  Covered with antibiotics including Unasyn and Zithromax. Patient does not seem to have pharyngeal phase of aspiration.  Most likely patient had acid reflux. Condition improved, will continue finishing a 4-day course of steroids, 5 days antibiotics with Augmentin .   Chronic kidney disease stage IIIb Hyperkalemia Metabolic acidosis. Urinary retention secondary to benign prostate hypertrophy. Patient renal function appears to be stable, but developed significant hyperkalemia.  This is most likely due to urinary retention.  Bladder scan showed residual more than 300 mL, patient was started on Flomax.  Patient was also given Lokelma , calcium IV, sodium bicarb IV.  Monitor potassium. Patient was able to urinate later, rescanned bladder with postvoid residual of 15 mL.  No need for Foley catheter. Potassium has normalized, prescribed Flomax.   Type 2 diabetes mellitus with peripheral neuropathy (HCC) Resume home treatment, follow-up with PCP to adjust treatment regimen   History of deep vein thrombosis (DVT) of lower extremity Patient still has significant right leg edema, obtain ultrasound to rule out continued DVT. Patient has been on Eliquis  at her 2.5 mg twice a day.  Will increase to 5 mg twice a day.   Debility. Patient refused any possibility of nursing home placement.  Chronic right knee wound. Patient was followed by [facility] orthopedics, he has scheduled appointment.  Also said home health to continue wound care.       Consultants: None Procedures performed: None  Disposition: Home health Diet recommendation:  Discharge Diet Orders (From admission, onward)     Start     Ordered   12/22/23 0000  Diet - low sodium  heart healthy        12/22/23 1042           Cardiac diet DISCHARGE MEDICATION: Allergies as of 12/22/2023       Reactions   Losartan Other (See  Comments)   Hyperkalemia (>5.5 multiple times on losartan 25mg )   Chicken Allergy Nausea And Vomiting   Poultry Meal Nausea And Vomiting        Medication List     STOP taking these medications    Chlorhexidine  Gluconate Cloth 2 % Pads   cyanocobalamin  1000 MCG tablet Commonly known as: VITAMIN B12   ferrous sulfate  325 (65 FE) MG tablet       TAKE these medications    acetaminophen  325 MG tablet Commonly known as: TYLENOL  Take 2 tablets (650 mg total) by mouth every 6 (six) hours as needed for mild pain (pain score 1-3) or fever (or Fever >/= 101).   albuterol  108 (90 Base) MCG/ACT inhaler Commonly known as: VENTOLIN  HFA Inhale 2 puffs into the lungs every 6 (six) hours as needed for wheezing or shortness of breath.   amoxicillin -clavulanate 875-125 MG tablet Commonly known as: AUGMENTIN  Take 1 tablet by mouth 2 (two) times daily for 3 days.   apixaban  5 MG Tabs tablet Commonly known as: ELIQUIS  Take 1 tablet (5 mg total) by mouth 2 (two) times daily. What changed: See the new instructions.   bisacodyl  5 MG EC tablet Commonly known as: DULCOLAX Take 1 tablet (5 mg total) by mouth daily as needed for moderate constipation.   feeding supplement (GLUCERNA SHAKE) Liqd Take 237 mLs by mouth 3 (three) times daily between meals.   gabapentin  300 MG capsule Commonly known as: NEURONTIN  Take 300 mg by mouth at bedtime. What changed: Another medication with the same name was removed. Continue taking this medication, and follow the directions you see here.   guaiFENesin  600 MG 12 hr tablet Commonly known as: Mucinex  Take 1 tablet (600 mg total) by mouth 2 (two) times daily.   metFORMIN  500 MG tablet Commonly known as: GLUCOPHAGE  Take 500 mg by mouth 2 (two) times daily with a meal.   methocarbamol  500 MG tablet Commonly known as: ROBAXIN  Take 1 tablet (500 mg total) by mouth every 6 (six) hours as needed for muscle spasms.   ondansetron  4 MG tablet Commonly  known as: ZOFRAN  Take 1 tablet (4 mg total) by mouth every 6 (six) hours as needed for nausea.   oxyCODONE -acetaminophen  5-325 MG tablet Commonly known as: PERCOCET/ROXICET Take 1 tablet by mouth every 6 (six) hours as needed.   pantoprazole  20 MG tablet Commonly known as: PROTONIX  Take 2 tablets (40 mg total) by mouth daily for 14 days.   polyethylene glycol 17 g packet Commonly known as: MIRALAX  / GLYCOLAX  Take 17 g by mouth daily.   predniSONE  20 MG tablet Commonly known as: DELTASONE  Take 2 tablets (40 mg total) by mouth daily with breakfast for 3 days. Start taking on: December 23, 2023   sodium chloride  flush 0.9 % Soln Commonly known as: NS 10-40 mLs by Intracatheter route every 12 (twelve) hours.   tamsulosin 0.4 MG Caps capsule Commonly known as: FLOMAX Take 1 capsule (0.4 mg total) by mouth daily. Start taking on: December 23, 2023   Trelegy Ellipta 200-62.5-25 MCG/ACT Aepb Generic drug: Fluticasone-Umeclidin-Vilant Inhale 1 puff into the lungs daily.   vitamin D3 25 MCG tablet Commonly known as: CHOLECALCIFEROL  Take 1 tablet (1,000 Units total) by mouth daily.  Discharge Care Instructions  (From admission, onward)           Start     Ordered   12/22/23 0000  Discharge wound care:       Comments: Cleanse R lower leg wounds with Vashe wound cleanser, do not rinse, allow to air dry. Apply Xeroform gauze (Lawson [phone]) to wound beds daily and secure with silicone foam or dry gauze and Kerlix roll gauze whichever is preferred.   12/22/23 1042            Follow-up Information     [facility] Follow up in 1 week(s).   Contact information: [address] [place] [address] [phone]                Discharge Exam: Fredricka Weights   12/20/23 1835  Weight: 59 kg   General exam: Appears calm and comfortable  Respiratory system: Clear to auscultation. Respiratory effort normal. Cardiovascular system:  S1 & S2 heard, RRR. No JVD, murmurs, rubs, gallops or clicks. No pedal edema. Gastrointestinal system: Abdomen is nondistended, soft and nontender. No organomegaly or masses felt. Normal bowel sounds heard. Central nervous system: Alert and oriented. No focal neurological deficits. Extremities: Wound below the right knee. Skin: No rashes, lesions or ulcers Psychiatry: Judgement and insight appear normal. Mood & affect appropriate.    Condition at discharge: fair  The results of significant diagnostics from this hospitalization (including imaging, microbiology, ancillary and laboratory) are listed below for reference.   Imaging Studies: US  Venous Img Lower Unilateral Right (DVT) Result Date: 12/21/2023 CLINICAL DATA:  Right lower extremity pain and edema. History left lower extremity DVT. EXAM: RIGHT LOWER EXTREMITY VENOUS DOPPLER ULTRASOUND TECHNIQUE: Gray-scale sonography with graded compression, as well as color Doppler and duplex ultrasound were performed to evaluate the lower extremity deep venous systems from the level of the common femoral vein and including the common femoral, femoral, profunda femoral, popliteal and calf veins including the posterior tibial, peroneal and gastrocnemius veins when visible. The superficial great saphenous vein was also interrogated. Spectral Doppler was utilized to evaluate flow at rest and with distal augmentation maneuvers in the common femoral, femoral and popliteal veins. COMPARISON:  None Available. FINDINGS: Contralateral Common Femoral Vein: Respiratory phasicity is normal and symmetric with the symptomatic side. No evidence of thrombus. Normal compressibility. Common Femoral Vein: No evidence of thrombus. Normal compressibility, respiratory phasicity and response to augmentation. Saphenofemoral Junction: No evidence of thrombus. Normal compressibility and flow on color Doppler imaging. Profunda Femoral Vein: No evidence of thrombus. Normal compressibility  and flow on color Doppler imaging. Femoral Vein: No evidence of thrombus. Normal compressibility, respiratory phasicity and response to augmentation. Popliteal Vein: No evidence of thrombus. Normal compressibility, respiratory phasicity and response to augmentation. Calf Veins: No evidence of thrombus. Normal compressibility and flow on color Doppler imaging. Superficial Great Saphenous Vein: No evidence of thrombus. Normal compressibility. Venous Reflux:  None. Other Findings: No evidence of superficial thrombophlebitis or abnormal fluid collection. IMPRESSION: No evidence of right lower extremity deep venous thrombosis. Electronically Signed   By: Marcey Moan M.D.   On: 12/21/2023 13:16   CT Angio Chest PE W and/or Wo Contrast Result Date: 12/20/2023 EXAM: CTA CHEST 12/20/2023 09:08:00 PM TECHNIQUE: CTA of the chest was performed without and with the administration of 60 mL of iohexol  (OMNIPAQUE ) 350 MG/ML injection. Multiplanar reformatted images are provided for review. MIP images are provided for review. Automated exposure control, iterative reconstruction, and/or weight based adjustment  of the mA/kV was utilized to reduce the radiation dose to as low as reasonably achievable. COMPARISON: Portable chest from 12/20/2023, recent chest CT without contrast 12/17/2023, CTA chest 12/05/2023, chest CT without contrast 08/23/2023. CLINICAL HISTORY: acute SOB, noncompliant eliquis , known DVT. eval PE FINDINGS: PULMONARY ARTERIES: Pulmonary arteries are normal in caliber. No arterial embolism is seen to the segmental level, but the subsegmental arteries are poorly opacified and largely obscured due to respiratory motion. A subsegmental embolus could be missed. MEDIASTINUM: The heart is normal in size. There are mild scattered 3-vessel coronary calcific plaques. No pericardial effusion. There is mild aortic and great vessel atherosclerosis without aneurysm, dissection or stenosis. LYMPH NODES: There are small  subcentimeter right hilar and AP window lymph nodes but no enlarged thoracic or axillary nodes. LUNGS AND PLEURA: There is mild centrilobular emphysematous change. There is mild reticulated scarring in the lung apices. There are thickened bronchi again in both lower lobes. There is layering fluid in the left greater than right main bronchi with fluid extending into the left lower lobe main bronchus and the basal directed segmental and scattered subsegmental and downstream bronchi. This was seen to varying degrees on all three prior CT exams. Favor chronic aspiration etiology. Left lower lobe volume loss is again noted. There is increased airspace disease in the posterior left lower lobe base likely indicating aspiration pneumonia. No other focal infiltrate is seen. There is a chronic 7 mm fissural nodule in the right lower lung field on series 6, image 100. This is benign and stable back to 2022. A 3 mm left lower lobe nodule is again noted on image 110. 5 mm perifissural right lower lobe nodule on image 88 is also stable. There is a 3 mm subpleural nodule on image 94, first seen on 12/05/2023. The lungs are otherwise clear. No evidence of pleural effusion or pneumothorax. UPPER ABDOMEN: In the abdomen, the gallbladder is packed with small stones without wall thickening. There are old postsurgical changes at the esophagogastric junction. Chronic antral gastrectomy. SOFT TISSUES AND BONES: There is osteopenia and mild dextroscoliosis and degenerative change of the thoracic spine with diffuse degenerative disc disease. No chest wall mass is seen. No acute bone or soft tissue abnormality. IMPRESSION: 1. No evidence of pulmonary embolism to the segmental level; subsegmental arteries are suboptimally evaluated due to motion, and a small subsegmental embolus could be missed. 2. Increased posterior left lower lobe airspace disease, likely aspiration pneumonia. 3. Increased fluid in the left greater than right main bronchi  and left lower lobe bronchi, seen to varying degrees on prior studies. Probable chronic aspiration. 4. Aortic and coronary atherosclerosis. 5. Cholelithiasis. Electronically signed by: Francis Quam MD 12/20/2023 09:39 PM EST RP Workstation: [id number]   DG Chest Portable 1 View Result Date: 12/20/2023 CLINICAL DATA:  History of COPD presenting with shortness of breath. EXAM: PORTABLE CHEST 1 VIEW COMPARISON:  October 22, 2023 FINDINGS: The heart size and mediastinal contours are within normal limits. Mild left infrahilar atelectasis and/or infiltrate is seen. A small, ill-defined focal opacity is seen overlying the upper right lung, adjacent to and existing cardiac lead. This is not seen on the prior study and has no corresponding abnormality on the most recent chest CT (dated December 17, 2023). No pleural effusion or pneumothorax is identified. Small radiopaque surgical clips are seen overlying the expected region of the gastroesophageal junction. The visualized skeletal structures are unremarkable. IMPRESSION: 1. Mild left infrahilar atelectasis and/or infiltrate. 2. Ill-defined focal opacity overlying the upper  right lung which may be related to adjacent overlying cardiac lead. Follow-up chest plain film is recommended if clinical symptoms persist to exclude an area of early focal airspace disease. Electronically Signed   By: Suzen Dials M.D.   On: 12/20/2023 19:28   CT CHEST WO CONTRAST Result Date: 12/17/2023 CLINICAL DATA:  Lung nodule on prior chest CT chronic cough history of stomach and colon cancer EXAM: CT CHEST WITHOUT CONTRAST TECHNIQUE: Multidetector CT imaging of the chest was performed following the standard protocol without IV contrast. RADIATION DOSE REDUCTION: This exam was performed according to the departmental dose-optimization program which includes automated exposure control, adjustment of the mA and/or kV according to patient size and/or use of iterative reconstruction  technique. COMPARISON:  CT 12/05/2023, 03/17/2020 FINDINGS: Cardiovascular: Limited evaluation without intravenous contrast. Mild aortic atherosclerosis. No aneurysm. Coronary vascular calcification. Normal cardiac size. No pericardial effusion. Mediastinum/Nodes: Patent trachea. No thyroid mass. No suspicious lymph nodes. Esophagus within normal limits. Postsurgical changes at the GE junction. Lungs/Pleura: Emphysema. No pleural effusion or pneumothorax. In general, improved aeration compared with 12/05/2023. Residual bilateral bronchial wall thickening, most evident in the left greater than right lower lobes with mild mucous plugging. Small Peri fissural right upper lobe nodule on series 3, image 108 is stable to 2022, consistent with benign process. 5 mm Peri fissural nodule on series 3, image 95 is also stable. Additional small pulmonary nodules are noted. Small left lower lobe pulmonary nodules measuring up to 3 mm, for example series 3, image 98. Upper Abdomen: Gallstones.  No acute finding. Musculoskeletal: No acute or suspicious osseous abnormality IMPRESSION: 1. In general, improved aeration compared with 12/05/2023. Residual bilateral bronchial wall thickening, most evident in the left greater than right lower lobes with mild mucous plugging suggesting airways inflammatory process or possible aspiration. Mild residual peribronchovascular patchy ground-glass disease in the left lower lobe but improved compared with October 31. 2. Emphysema. 3. Multiple small pulmonary nodules measuring up to 5 mm. Favor benign pulmonary nodules but consider 3 to six-month CT follow-up to confirm stability. 4. Gallstones. 5. Aortic atherosclerosis. Aortic Atherosclerosis (ICD10-I70.0) and Emphysema (ICD10-J43.9). Electronically Signed   By: Luke Bun M.D.   On: 12/17/2023 15:44   CT Angio Chest PE W and/or Wo Contrast Result Date: 12/05/2023 EXAM: CTA of the Chest with contrast for PE 12/05/2023 11:47:00 AM  TECHNIQUE: CTA of the chest was performed after the administration of 75 mL iohexol  (OMNIPAQUE ) 350 MG/ML injection. Multiplanar reformatted images are provided for review. MIP images are provided for review. Automated exposure control, iterative reconstruction, and/or weight based adjustment of the mA/kV was utilized to reduce the radiation dose to as low as reasonably achievable. COMPARISON: None available. CLINICAL HISTORY: Pulmonary embolism (PE) suspected, low to intermediate prob, positive D-dimer. FINDINGS: PULMONARY ARTERIES: Pulmonary arteries are adequately opacified for evaluation. No evidence of acute pulmonary embolism. Main pulmonary artery is normal in caliber. MEDIASTINUM: The heart and pericardium demonstrate no acute abnormality. There is no acute abnormality of the thoracic aorta. LYMPH NODES: No mediastinal, hilar or axillary lymphadenopathy. LUNGS AND PLEURA: Peribronchial thickening and mild interstitial thickening within the left lower lobe. Findings suggestive of bronchitis or aspiration pneumonitis. Small nodule in the left lower lobe measures 4 mm on image 93 of series 4. Subtle ground glass opacities in the upper lobes suggest mild edema. Nodule on the right horizontal fissure measures 4 mm. No focal consolidation. No pleural effusion or pneumothorax. UPPER ABDOMEN: Multiple gallstones within the gallbladder. SOFT TISSUES AND BONES:  No acute bone or soft tissue abnormality. IMPRESSION: 1. No evidence of acute pulmonary embolism. 2. Peribronchial thickening and mild interstitial thickening within the left lower lobe, suggestive of bronchitis or aspiration pneumonitis. 3. Subtle ground glass opacities in the upper lobes, suggestive of mild edema. 4. Multiple small solid pulmonary nodules measuring up to 45 mm. No routine follow-up imaging is recommended per Fleischner Society Guidelines for nodules of this size in the absence of high-risk features or risk factors; if high risk, optional  non-contrast chest CT at 12 months. Electronically signed by: Norleen Boxer MD 12/05/2023 01:32 PM EDT RP Workstation: [id number]   CT ABDOMEN PELVIS W CONTRAST Result Date: 12/05/2023 CLINICAL DATA:  Epigastric and abdominal pain. Previous gastric and colon carcinoma. EXAM: CT ABDOMEN AND PELVIS WITH CONTRAST TECHNIQUE: Multidetector CT imaging of the abdomen and pelvis was performed using the standard protocol following bolus administration of intravenous contrast. RADIATION DOSE REDUCTION: This exam was performed according to the departmental dose-optimization program which includes automated exposure control, adjustment of the mA and/or kV according to patient size and/or use of iterative reconstruction technique. CONTRAST:  75mL OMNIPAQUE  IOHEXOL  350 MG/ML SOLN COMPARISON:  08/23/2023 FINDINGS: Lower Chest: Left lower lobe atelectasis or infiltrate. See separate chest CTA report from earlier today. Hepatobiliary: No suspicious hepatic masses identified. Numerous tiny gallstones are seen, without signs of cholecystitis or biliary ductal dilatation. Pancreas:  No mass or inflammatory changes. Spleen: Within normal limits in size and appearance. Adrenals/Urinary Tract: No suspicious masses identified. No evidence of ureteral calculi or hydronephrosis. Stomach/Bowel: Postop changes seen from prior Billroth 2 procedure. Prior left colon resection also noted. No abnormal soft tissue masses are identified. No No evidence of inflammatory process or abnormal fluid collections. Large stool burden noted throughout the colon. Vascular/Lymphatic: No pathologically enlarged lymph nodes. No acute vascular findings. Reproductive:  No mass or other significant abnormality. Other:  None. Musculoskeletal:  No suspicious bone lesions identified. IMPRESSION: No acute findings.  No evidence of recurrent or metastatic disease. Cholelithiasis. No radiographic evidence of cholecystitis. Large stool burden noted; recommend clinical  correlation for possible constipation. Electronically Signed   By: Norleen DELENA Kil M.D.   On: 12/05/2023 12:29    Microbiology: Results for orders placed or performed during the hospital encounter of 12/20/23  Blood culture (routine x 2)     Status: None (Preliminary result)   Collection Time: 12/20/23 10:47 PM   Specimen: BLOOD  Result Value Ref Range Status   Specimen Description BLOOD BLOOD RIGHT ARM  Final   Special Requests   Final    BOTTLES DRAWN AEROBIC AND ANAEROBIC Blood Culture results may not be optimal due to an inadequate volume of blood received in culture bottles   Culture   Final    NO GROWTH 2 DAYS Performed at [facility], [address], [place], [place] [address]    Report Status PENDING  Incomplete  Blood culture (routine x 2)     Status: None (Preliminary result)   Collection Time: 12/20/23 10:47 PM   Specimen: BLOOD  Result Value Ref Range Status   Specimen Description BLOOD BLOOD RIGHT ARM  Final   Special Requests   Final    BOTTLES DRAWN AEROBIC AND ANAEROBIC Blood Culture results may not be optimal due to an inadequate volume of blood received in culture bottles   Culture   Final    NO GROWTH 2 DAYS Performed at [facility], [address], [place], [place] [address]    Report  Status PENDING  Incomplete    Labs: CBC: Recent Labs  Lab 12/20/23 1850 12/21/23 0533  WBC 9.2 7.4  NEUTROABS 7.0  --   HGB 9.2* 8.2*  HCT 30.4* 26.5*  MCV 93.0 90.4  PLT 286 226   Basic Metabolic Panel: Recent Labs  Lab 12/20/23 1850 12/21/23 0533 12/21/23 0922 12/21/23 1352 12/22/23 0404  NA 136 137  --   --  138  K 5.6* 5.8* 5.5* 4.8 4.8  CL 106 106  --   --  108  CO2 20* 19*  --   --  20*  GLUCOSE 173* 278*  --   --  155*  BUN 23 24*  --   --  29*  CREATININE 1.89* 1.79*  --   --  1.59*  CALCIUM 8.9 8.6*  --   --  8.4*  MG 2.0  --   --   --   --    Liver Function Tests: Recent Labs  Lab 12/20/23 1850  AST 21  ALT 8  ALKPHOS  131*  BILITOT 0.3  PROT 6.6  ALBUMIN  3.5   CBG: Recent Labs  Lab 12/21/23 0757 12/21/23 1128 12/21/23 1621 12/21/23 2251 12/22/23 0810  GLUCAP 195* 249* 249* 269* 170*    Discharge time spent: 35 minutes.  Signed: Murvin Mana, MD [facility] 12/22/2023

## 2023-12-22 NOTE — Plan of Care (Signed)

## 2023-12-22 NOTE — Evaluation (Signed)
 Physical Therapy Evaluation Patient Details Name: Joe Coleman MRN: [id number] DOB: 10-16-1945 Today's Date: 12/22/2023  History of Present Illness  Pt is a 78 y.o.male with medical history significant for type diabetes mellitus and hypertension, history of left lower extremity DVT on Eliquis , and colon cancer status post resection fall leading to R tib fx ORIF s/p 7/21, followed by infection and hardware removel 10/15/23. MD assessment includes: sepsis due to pneumonia, COPD, Hyperkalemia, CKD   Clinical Impression  Pt was found in bed upon arrival, was pleasant and motivated to participate during the session and put forth good effort throughout. Pt reported pain as being a 10/10 at the beginning of session prior to movement and 0/10 at the end of the session after activity (nursing notified). Pt reports his knee as being the primary source of pain, due to a recent removal of hardware. Since, he has been placed on WBAT precautions (detailed ortho note below in precautions) to promote healing until pain is well managed. Pts SpO2 WNL, no SOB throughout. Pt was able to tolerate step pivot transfers x2 from bed to chair, without any increase in pain, CGA but needed extensive cueing for limiting ambulation until pain has resolved. Pt was a bit impulsive and quick to move on his own without regard to safety and sequencing. Pt will benefit from continued PT services upon discharge to safely address deficits listed in patient problem list for decreased caregiver assistance and eventual return to PLOF.       If plan is discharge home, recommend the following: A little help with walking and/or transfers;A little help with bathing/dressing/bathroom;Assistance with cooking/housework;Help with stairs or ramp for entrance   Can travel by private vehicle        Equipment Recommendations    Recommendations for Other Services       Functional Status Assessment Patient has had a recent decline in their  functional status and demonstrates the ability to make significant improvements in function in a reasonable and predictable amount of time.     Precautions / Restrictions Precautions Precautions: Fall Recall of Precautions/Restrictions: Intact Restrictions Weight Bearing Restrictions Per Provider Order: Yes RLE Weight Bearing Per Provider Order: Weight bearing as tolerated Other Position/Activity Restrictions: transfers to start; if no pain, progress to ambulation with RW as able      Mobility  Bed Mobility Overal bed mobility: Independent                  Transfers Overall transfer level: Needs assistance Equipment used: Rolling walker (2 wheels) Transfers: Bed to chair/wheelchair/BSC     Step pivot transfers: Contact guard assist       General transfer comment: pt was able to transfer from bed to chair/chair to bed with CGA --pt needed cueing/reinforcement for above precautions, as he was impulsive at times and quick to move with little regard to precautions    Ambulation/Gait Ambulation/Gait assistance: Contact guard assist Gait Distance (Feet): 4 Feet Assistive device: Rolling walker (2 wheels) Gait Pattern/deviations: WFL(Within Functional Limits) Gait velocity: decreased     General Gait Details: pt took a couple of steps foward and backward (bed to chair and chair to bed) with RW  Stairs            Wheelchair Mobility     Tilt Bed    Modified Rankin (Stroke Patients Only)       Balance Overall balance assessment: Needs assistance   Sitting balance-Leahy Scale: Normal     Standing balance support:  No upper extremity supported Standing balance-Leahy Scale: Good Standing balance comment: needs cueing for WBAT                             Pertinent Vitals/Pain Pain Assessment Pain Assessment: 0-10 Pain Score: 0-No pain Pain Location: pt reported pain being at a 10/10 and 0/10 within a very short window of time Pain  Intervention(s): Monitored during session, Repositioned    Home Living Family/patient expects to be discharged to:: Private residence Living Arrangements: Alone Available Help at Discharge: Available PRN/intermittently;Friend(s) Type of Home: Apartment Home Access: Stairs to enter Entrance Stairs-Rails: None Entrance Stairs-Number of Steps: 2   Home Layout: One level Home Equipment: Rollator (4 wheels);Rolling Walker (2 wheels);Wheelchair - manual;Shower seat      Prior Function Prior Level of Function : Independent/Modified Independent             Mobility Comments: pt does not do much outside of the home. He ambulates around the home with RW and reports having friends help him in the community (driving, picking up groceries, going out to eat). Pt has had PT in the past to help with mobility. ADLs Comments: pt does not help with showering and toileting but requires help for cooking, cleaning and chores around the home. Pt reports he has help come by daily to help with these activities.     Extremity/Trunk Assessment                Communication   Communication Communication: No apparent difficulties Factors Affecting Communication: Reduced clarity of speech    Cognition Arousal: Alert Behavior During Therapy: WFL for tasks assessed/performed   PT - Cognitive impairments: No apparent impairments                         Following commands: Intact       Cueing Cueing Techniques: Verbal cues     General Comments General comments (skin integrity, edema, etc.): Despite having been to PT in the past for TKA, pt needed extensive education on WBAT tolerated precautions due to recent hardware removal. Pts SpO2 WNL throughout.    Exercises Other Exercises Other Exercises: Pt edu on above precautions Other Exercises: Pt edu on benefits of LE exercises in bed   Assessment/Plan    PT Assessment Patient needs continued PT services  PT Problem List Decreased  strength;Decreased range of motion;Decreased activity tolerance;Decreased balance;Decreased mobility;Decreased coordination;Decreased knowledge of use of DME;Decreased safety awareness;Decreased knowledge of precautions;Pain       PT Treatment Interventions DME instruction;Gait training;Stair training;Functional mobility training;Therapeutic activities;Therapeutic exercise;Balance training;Patient/family education    PT Goals (Current goals can be found in the Care Plan section)  Acute Rehab PT Goals Patient Stated Goal: getting back home PT Goal Formulation: With patient Time For Goal Achievement: 01/04/24 Potential to Achieve Goals: Good    Frequency Min 1X/week     Co-evaluation               AM-PAC PT 6 Clicks Mobility  Outcome Measure Help needed turning from your back to your side while in a flat bed without using bedrails?: None Help needed moving from lying on your back to sitting on the side of a flat bed without using bedrails?: None Help needed moving to and from a bed to a chair (including a wheelchair)?: A Little Help needed standing up from a chair using your arms (e.g., wheelchair or bedside  chair)?: A Little Help needed to walk in hospital room?: A Little Help needed climbing 3-5 steps with a railing? : A Little 6 Click Score: 20    End of Session Equipment Utilized During Treatment: Gait belt Activity Tolerance: Patient tolerated treatment well;No increased pain Patient left: in bed;with call bell/phone within reach;with bed alarm set (nursing was present at the end of session) Nurse Communication: Mobility status PT Visit Diagnosis: Difficulty in walking, not elsewhere classified (R26.2);History of falling (Z91.81);Unsteadiness on feet (R26.81);Pain Pain - Right/Left: Right Pain - part of body: Knee    Time: 8897-8867 PT Time Calculation (min) (ACUTE ONLY): 30 min   Charges:               Corean Newport, SPT 12/22/23, 2:31 PM

## 2023-12-22 NOTE — Plan of Care (Signed)
  Problem: Fluid Volume: Goal: Hemodynamic stability will improve Outcome: Adequate for Discharge   Problem: Clinical Measurements: Goal: Diagnostic test results will improve Outcome: Adequate for Discharge Goal: Signs and symptoms of infection will decrease Outcome: Adequate for Discharge   Problem: Respiratory: Goal: Ability to maintain adequate ventilation will improve Outcome: Adequate for Discharge   Problem: Education: Goal: Knowledge of General Education information will improve Description: Including pain rating scale, medication(s)/side effects and non-pharmacologic comfort measures Outcome: Adequate for Discharge   Problem: Health Behavior/Discharge Planning: Goal: Ability to manage health-related needs will improve Outcome: Adequate for Discharge   Problem: Clinical Measurements: Goal: Ability to maintain clinical measurements within normal limits will improve Outcome: Adequate for Discharge Goal: Will remain free from infection Outcome: Adequate for Discharge Goal: Diagnostic test results will improve Outcome: Adequate for Discharge Goal: Respiratory complications will improve Outcome: Adequate for Discharge Goal: Cardiovascular complication will be avoided Outcome: Adequate for Discharge   Problem: Activity: Goal: Risk for activity intolerance will decrease Outcome: Adequate for Discharge   Problem: Nutrition: Goal: Adequate nutrition will be maintained Outcome: Adequate for Discharge   Problem: Coping: Goal: Level of anxiety will decrease Outcome: Adequate for Discharge   Problem: Elimination: Goal: Will not experience complications related to bowel motility Outcome: Adequate for Discharge Goal: Will not experience complications related to urinary retention Outcome: Adequate for Discharge   Problem: Pain Managment: Goal: General experience of comfort will improve and/or be controlled Outcome: Adequate for Discharge   Problem: Safety: Goal:  Ability to remain free from injury will improve Outcome: Adequate for Discharge   Problem: Skin Integrity: Goal: Risk for impaired skin integrity will decrease Outcome: Adequate for Discharge   Problem: Education: Goal: Ability to describe self-care measures that may prevent or decrease complications (Diabetes Survival Skills Education) will improve Outcome: Adequate for Discharge Goal: Individualized Educational Video(s) Outcome: Adequate for Discharge   Problem: Coping: Goal: Ability to adjust to condition or change in health will improve Outcome: Adequate for Discharge   Problem: Fluid Volume: Goal: Ability to maintain a balanced intake and output will improve Outcome: Adequate for Discharge   Problem: Health Behavior/Discharge Planning: Goal: Ability to identify and utilize available resources and services will improve Outcome: Adequate for Discharge Goal: Ability to manage health-related needs will improve Outcome: Adequate for Discharge   Problem: Metabolic: Goal: Ability to maintain appropriate glucose levels will improve Outcome: Adequate for Discharge   Problem: Nutritional: Goal: Maintenance of adequate nutrition will improve Outcome: Adequate for Discharge Goal: Progress toward achieving an optimal weight will improve Outcome: Adequate for Discharge   Problem: Skin Integrity: Goal: Risk for impaired skin integrity will decrease Outcome: Adequate for Discharge   Problem: Tissue Perfusion: Goal: Adequacy of tissue perfusion will improve Outcome: Adequate for Discharge

## 2023-12-22 NOTE — Consult Note (Addendum)
 WOC Nurse wound follow up Wound type: R knee wounds - post op full thickness. Measurement: 4 cm x 0.8 cm x 0.5 cm Wound bed: 100% yellow, hypergranulation 0.5 cm x 0.5 cm. Bone exposition. Drainage (amount, consistency, odor) Minimum amount, serous, no odor. Periwound: dry, intact. Dressing procedure/placement/frequency: Continue cleanse R lower leg wounds with Vashe wound cleanser, do not rinse, allow to air dry. Apply Xeroform gauze ([place] [phone]) to wound beds daily and secure with silicone foam or dry gauze and Kerlix roll gauze whichever is preferred.   Referral with orthopedics outpatient to resume the wound care.  Note: Secure chat with nursing team.  WOC team will not plan to follow further. Please reconsult if further assistance is needed. Thank-you,  Lela Holm MSN, RN, CNS.  (Phone [phone])

## 2023-12-25 LAB — CULTURE, BLOOD (ROUTINE X 2)
Culture: NO GROWTH
Culture: NO GROWTH

## 2024-01-09 NOTE — Progress Notes (Signed)
 "   [facility] SAME DAY CLINIC NOTE   01/09/2024  PCP: Trudy Merri LABOR, MD   Assessment and Plan Assessment & Plan S/p GLF iso gait instability 2/2 repair of R tibial fx w/post-op complications Noted to have multiple falls overnight 2/2 RLE instability and lack of adequate home DME. No trauma or LOC noted 2/2 falls. No bleeding or skin tearing noted. Patient has wound care dressing present to RLE 2/2 post-op complications of ORIF in July. Serous drainage noted to area. Patient with good ROM/strength of RLE on exam today. Will provide rollator for adequate assistance with ambulation and pain control. - Rollator ordered via [employer] - Oxycodone -APAP refilled today, advised to take q12h PRN for management - Gabapentin  300 mg nightly refilled - Nursing staff performed dressing change on right lower extremity surgical site.  Follow up as scheduled or sooner as needed.  Patient was seen and discussed with Dr. Lyndy who is in agreement with the assessment and plan as outlined above.    Subjective  Problem List: Problem List[1]  HPI Joe Coleman is a 78 y.o. year old male with the above problem list who presents to the same day clinic for  Chief Complaint  Patient presents with   Follow-up    Fell 3 x last night     History of Present Illness Joe Coleman is an 78 year old male with diabetes who presents with falls and requests bandage change and pain management.  He experienced three falls last night due to balance issues and not having his walker nearby. During one incident, he attempted to go to the bathroom without the walker and fell, hitting his hip and buttocks on the floor. No head injury or loss of consciousness occurred during these falls. He is requesting additional DME following these incidents.  He has a history of surgery on his right tibia in July and has been hospitalized since then for post-op complications. He recalls a previous  incident where the hardware 'popped out' after a fall, but this did not occur this time. He currently has wound care dressing present with SteriStrips.  On chart review, noted to follow-up with ID in November, who discontinued antibiotics at that time. At present, wound site appears improved from prior.  Meds and allergies were reviewed in Epic  ROS: 10 point ROS was performed and is otherwise negative other than mentioned in the HPI  Objective PE: Vitals:   01/09/24 1350  BP: 108/53  Pulse: 100  Resp: 18  Temp: 36.3 C (97.3 F)  SpO2: 97%   General: well-appearing in NAD CV: regular, no murmurs Resp: CTAB, no wheezes or crackles, normal WOB MSK: full ROM, RLE strength appropriate, no tenderness noted to R hip. Mild tenderness noted to R patella Skin: Dressing present to RLE inferior to patella, incision healing, dressing c/d/i Ext: no cyanosis/clubbing/edema Neuro: alert, follows commands. CN II-XII grossly intact  Procedure: None _______________________________________________________________________________________ Same Day Metric Tracker:  Same Day Metric Tracker: Referral source for today's visit:  General Internal Medicine  Referral to other outpatient urgent services? N/A  Did today's visit result in referral to ED or direct admission? No          [1] Patient Active Problem List Diagnosis   Anemia in neoplastic disease   Malignant neoplasm of rectosigmoid junction    (CMS-HCC)   Malignant neoplasm of stomach (CMS-HCC)   Encounter for long-term (current) use of other medications   Primary localized osteoarthrosis, lower  leg   Do not give narcotics   Pain medication agreement broken   Vitiligo   Type 2 diabetes mellitus with peripheral neuropathy (CMS-HCC)   Insomnia   Foot pain, bilateral   Essential hypertension   Depression   Adenomatous colon polyp   Benign prostatic hyperplasia with lower urinary tract symptoms   Neuropathic pain    Chronic bilateral low back pain without sciatica   Epigastric pain   Enrolled in chronic care management   Cough   Chronic cough   Mild intermittent asthma without complication (HHS-HCC)   Chronic kidney disease (CKD), stage III (moderate) (CMS-HCC)   Erectile dysfunction   Risk for falls   SIGNED MEDICATION CONTRACT WITH [facility]   Illiterate   B12 deficiency   Acute kidney injury superimposed on chronic kidney disease   Shortness of breath   Fall   Hyponatremia   Normocytic anemia   Lymphopenia   Asthma-COPD overlap syndrome    (CMS-HCC)   Right lower lobe pneumonia   Eye pain, bilateral   Rhinovirus  "

## 2024-01-24 ENCOUNTER — Emergency Department
Admission: EM | Admit: 2024-01-24 | Discharge: 2024-01-26 | Disposition: A | Discharge status: Other Acute Inpatient Hospital

## 2024-01-24 ENCOUNTER — Encounter (HOSPITAL_COMMUNITY): Payer: Self-pay

## 2024-01-24 ENCOUNTER — Other Ambulatory Visit: Payer: Self-pay

## 2024-01-24 ENCOUNTER — Encounter: Payer: Self-pay | Admitting: Emergency Medicine

## 2024-01-24 ENCOUNTER — Emergency Department

## 2024-01-24 DIAGNOSIS — M9711XA Periprosthetic fracture around internal prosthetic right knee joint, initial encounter: Secondary | ICD-10-CM | POA: Insufficient documentation

## 2024-01-24 DIAGNOSIS — M978XXA Periprosthetic fracture around other internal prosthetic joint, initial encounter: Secondary | ICD-10-CM

## 2024-01-24 DIAGNOSIS — S8991XA Unspecified injury of right lower leg, initial encounter: Secondary | ICD-10-CM | POA: Insufficient documentation

## 2024-01-24 DIAGNOSIS — I129 Hypertensive chronic kidney disease with stage 1 through stage 4 chronic kidney disease, or unspecified chronic kidney disease: Secondary | ICD-10-CM | POA: Insufficient documentation

## 2024-01-24 DIAGNOSIS — E875 Hyperkalemia: Secondary | ICD-10-CM | POA: Diagnosis not present

## 2024-01-24 DIAGNOSIS — E1122 Type 2 diabetes mellitus with diabetic chronic kidney disease: Secondary | ICD-10-CM | POA: Diagnosis not present

## 2024-01-24 DIAGNOSIS — W19XXXA Unspecified fall, initial encounter: Secondary | ICD-10-CM | POA: Diagnosis not present

## 2024-01-24 DIAGNOSIS — Z96651 Presence of right artificial knee joint: Secondary | ICD-10-CM | POA: Insufficient documentation

## 2024-01-24 DIAGNOSIS — Z86718 Personal history of other venous thrombosis and embolism: Secondary | ICD-10-CM

## 2024-01-24 DIAGNOSIS — N1832 Chronic kidney disease, stage 3b: Secondary | ICD-10-CM | POA: Diagnosis not present

## 2024-01-24 DIAGNOSIS — J441 Chronic obstructive pulmonary disease with (acute) exacerbation: Secondary | ICD-10-CM | POA: Insufficient documentation

## 2024-01-24 DIAGNOSIS — T847XXD Infection and inflammatory reaction due to other internal orthopedic prosthetic devices, implants and grafts, subsequent encounter: Secondary | ICD-10-CM | POA: Diagnosis not present

## 2024-01-24 DIAGNOSIS — Z7984 Long term (current) use of oral hypoglycemic drugs: Secondary | ICD-10-CM | POA: Insufficient documentation

## 2024-01-24 DIAGNOSIS — I10 Essential (primary) hypertension: Secondary | ICD-10-CM | POA: Diagnosis present

## 2024-01-24 DIAGNOSIS — J449 Chronic obstructive pulmonary disease, unspecified: Secondary | ICD-10-CM | POA: Diagnosis present

## 2024-01-24 DIAGNOSIS — N400 Enlarged prostate without lower urinary tract symptoms: Secondary | ICD-10-CM | POA: Insufficient documentation

## 2024-01-24 DIAGNOSIS — D72829 Elevated white blood cell count, unspecified: Secondary | ICD-10-CM | POA: Diagnosis not present

## 2024-01-24 DIAGNOSIS — M009 Pyogenic arthritis, unspecified: Secondary | ICD-10-CM | POA: Insufficient documentation

## 2024-01-24 DIAGNOSIS — Z7901 Long term (current) use of anticoagulants: Secondary | ICD-10-CM | POA: Insufficient documentation

## 2024-01-24 DIAGNOSIS — I82403 Acute embolism and thrombosis of unspecified deep veins of lower extremity, bilateral: Secondary | ICD-10-CM | POA: Insufficient documentation

## 2024-01-24 DIAGNOSIS — C19 Malignant neoplasm of rectosigmoid junction: Secondary | ICD-10-CM | POA: Diagnosis present

## 2024-01-24 DIAGNOSIS — Z85028 Personal history of other malignant neoplasm of stomach: Secondary | ICD-10-CM | POA: Insufficient documentation

## 2024-01-24 DIAGNOSIS — Z85038 Personal history of other malignant neoplasm of large intestine: Secondary | ICD-10-CM | POA: Diagnosis not present

## 2024-01-24 DIAGNOSIS — T847XXA Infection and inflammatory reaction due to other internal orthopedic prosthetic devices, implants and grafts, initial encounter: Secondary | ICD-10-CM | POA: Diagnosis present

## 2024-01-24 DIAGNOSIS — Z96659 Presence of unspecified artificial knee joint: Secondary | ICD-10-CM

## 2024-01-24 DIAGNOSIS — K219 Gastro-esophageal reflux disease without esophagitis: Secondary | ICD-10-CM | POA: Diagnosis not present

## 2024-01-24 DIAGNOSIS — E1142 Type 2 diabetes mellitus with diabetic polyneuropathy: Secondary | ICD-10-CM | POA: Diagnosis present

## 2024-01-24 DIAGNOSIS — C169 Malignant neoplasm of stomach, unspecified: Secondary | ICD-10-CM | POA: Diagnosis present

## 2024-01-24 LAB — COMPREHENSIVE METABOLIC PANEL WITH GFR
ALT: 6 U/L (ref 0–44)
AST: 20 U/L (ref 15–41)
Albumin: 3.8 g/dL (ref 3.5–5.0)
Alkaline Phosphatase: 174 U/L — ABNORMAL HIGH (ref 38–126)
Anion gap: 8 (ref 5–15)
BUN: 18 mg/dL (ref 8–23)
CO2: 23 mmol/L (ref 22–32)
Calcium: 8.9 mg/dL (ref 8.9–10.3)
Chloride: 104 mmol/L (ref 98–111)
Creatinine, Ser: 1.31 mg/dL — ABNORMAL HIGH (ref 0.61–1.24)
GFR, Estimated: 55 mL/min — ABNORMAL LOW
Glucose, Bld: 171 mg/dL — ABNORMAL HIGH (ref 70–99)
Potassium: 6.4 mmol/L (ref 3.5–5.1)
Sodium: 135 mmol/L (ref 135–145)
Total Bilirubin: 0.5 mg/dL (ref 0.0–1.2)
Total Protein: 7.1 g/dL (ref 6.5–8.1)

## 2024-01-24 LAB — BASIC METABOLIC PANEL WITH GFR
Anion gap: 13 (ref 5–15)
Anion gap: 10 (ref 5–15)
BUN: 18 mg/dL (ref 8–23)
BUN: 16 mg/dL (ref 8–23)
CO2: 19 mmol/L — ABNORMAL LOW (ref 22–32)
CO2: 21 mmol/L — ABNORMAL LOW (ref 22–32)
Calcium: 8.4 mg/dL — ABNORMAL LOW (ref 8.9–10.3)
Calcium: 8.6 mg/dL — ABNORMAL LOW (ref 8.9–10.3)
Chloride: 100 mmol/L (ref 98–111)
Chloride: 101 mmol/L (ref 98–111)
Creatinine, Ser: 1.38 mg/dL — ABNORMAL HIGH (ref 0.61–1.24)
Creatinine, Ser: 1.32 mg/dL — ABNORMAL HIGH (ref 0.61–1.24)
GFR, Estimated: 52 mL/min — ABNORMAL LOW
GFR, Estimated: 55 mL/min — ABNORMAL LOW
Glucose, Bld: 229 mg/dL — ABNORMAL HIGH (ref 70–99)
Glucose, Bld: 80 mg/dL (ref 70–99)
Potassium: 6.6 mmol/L (ref 3.5–5.1)
Potassium: 4.9 mmol/L (ref 3.5–5.1)
Sodium: 132 mmol/L — ABNORMAL LOW (ref 135–145)
Sodium: 132 mmol/L — ABNORMAL LOW (ref 135–145)

## 2024-01-24 LAB — POTASSIUM: Potassium: 6.6 mmol/L (ref 3.5–5.1)

## 2024-01-24 LAB — CBG MONITORING, ED
Glucose-Capillary: 126 mg/dL — ABNORMAL HIGH (ref 70–99)
Glucose-Capillary: 241 mg/dL — ABNORMAL HIGH (ref 70–99)

## 2024-01-24 LAB — LACTIC ACID, PLASMA
Lactic Acid, Venous: 1.2 mmol/L (ref 0.5–1.9)
Lactic Acid, Venous: 1.6 mmol/L (ref 0.5–1.9)

## 2024-01-24 LAB — CBC WITH DIFFERENTIAL/PLATELET
Abs Immature Granulocytes: 0.18 K/uL — ABNORMAL HIGH (ref 0.00–0.07)
Basophils Absolute: 0.1 K/uL (ref 0.0–0.1)
Basophils Relative: 1 %
Eosinophils Absolute: 0.1 K/uL (ref 0.0–0.5)
Eosinophils Relative: 0 %
HCT: 32.4 % — ABNORMAL LOW (ref 39.0–52.0)
Hemoglobin: 9.8 g/dL — ABNORMAL LOW (ref 13.0–17.0)
Immature Granulocytes: 1 %
Lymphocytes Relative: 6 %
Lymphs Abs: 1 K/uL (ref 0.7–4.0)
MCH: 27.8 pg (ref 26.0–34.0)
MCHC: 30.2 g/dL (ref 30.0–36.0)
MCV: 91.8 fL (ref 80.0–100.0)
Monocytes Absolute: 1.7 K/uL — ABNORMAL HIGH (ref 0.1–1.0)
Monocytes Relative: 10 %
Neutro Abs: 14.6 K/uL — ABNORMAL HIGH (ref 1.7–7.7)
Neutrophils Relative %: 82 %
Platelets: 341 K/uL (ref 150–400)
RBC: 3.53 MIL/uL — ABNORMAL LOW (ref 4.22–5.81)
RDW: 15.5 % (ref 11.5–15.5)
WBC: 17.5 K/uL — ABNORMAL HIGH (ref 4.0–10.5)
nRBC: 0 % (ref 0.0–0.2)

## 2024-01-24 LAB — PROCALCITONIN: Procalcitonin: 0.52 ng/mL

## 2024-01-24 LAB — RESP PANEL BY RT-PCR (RSV, FLU A&B, COVID)  RVPGX2
Influenza A by PCR: NEGATIVE
Influenza B by PCR: NEGATIVE
Resp Syncytial Virus by PCR: NEGATIVE
SARS Coronavirus 2 by RT PCR: NEGATIVE

## 2024-01-24 LAB — PROTIME-INR
INR: 1 (ref 0.8–1.2)
Prothrombin Time: 13.6 s (ref 11.4–15.2)

## 2024-01-24 LAB — SEDIMENTATION RATE: Sed Rate: 45 mm/h — ABNORMAL HIGH (ref 0–20)

## 2024-01-24 LAB — C-REACTIVE PROTEIN: CRP: 9.1 mg/dL — ABNORMAL HIGH

## 2024-01-24 MED ORDER — ALBUTEROL SULFATE (2.5 MG/3ML) 0.083% IN NEBU
10.0000 mg | INHALATION_SOLUTION | Freq: Once | RESPIRATORY_TRACT | Status: AC
  Administered 2024-01-24: 10 mg via RESPIRATORY_TRACT
  Filled 2024-01-24: qty 12

## 2024-01-24 MED ORDER — SODIUM CHLORIDE 0.9 % IV BOLUS
1000.0000 mL | Freq: Once | INTRAVENOUS | Status: AC
  Administered 2024-01-24: 1000 mL via INTRAVENOUS

## 2024-01-24 MED ORDER — SODIUM ZIRCONIUM CYCLOSILICATE 10 G PO PACK
10.0000 g | PACK | Freq: Once | ORAL | Status: AC
  Administered 2024-01-24: 10 g via ORAL
  Filled 2024-01-24: qty 1

## 2024-01-24 MED ORDER — METRONIDAZOLE 500 MG/100ML IV SOLN
500.0000 mg | Freq: Once | INTRAVENOUS | Status: AC
  Administered 2024-01-24: 500 mg via INTRAVENOUS
  Filled 2024-01-24: qty 100

## 2024-01-24 MED ORDER — FUROSEMIDE 10 MG/ML IJ SOLN
40.0000 mg | Freq: Once | INTRAMUSCULAR | Status: AC
  Administered 2024-01-24: 40 mg via INTRAVENOUS
  Filled 2024-01-24: qty 4

## 2024-01-24 MED ORDER — CALCIUM GLUCONATE-NACL 1-0.675 GM/50ML-% IV SOLN
1.0000 g | Freq: Once | INTRAVENOUS | Status: AC
  Administered 2024-01-24: 1000 mg via INTRAVENOUS
  Filled 2024-01-24: qty 50

## 2024-01-24 MED ORDER — DEXTROSE 50 % IV SOLN
1.0000 | Freq: Once | INTRAVENOUS | Status: AC
  Administered 2024-01-24: 50 mL via INTRAVENOUS
  Filled 2024-01-24: qty 50

## 2024-01-24 MED ORDER — LACTATED RINGERS IV SOLN
INTRAVENOUS | Status: AC
  Administered 2024-01-24: 1000 mL via INTRAVENOUS

## 2024-01-24 MED ORDER — INSULIN ASPART 100 UNIT/ML IV SOLN
10.0000 [IU] | Freq: Once | INTRAVENOUS | Status: AC
  Administered 2024-01-24: 10 [IU] via INTRAVENOUS
  Filled 2024-01-24: qty 10

## 2024-01-24 MED ORDER — VANCOMYCIN HCL IN DEXTROSE 1-5 GM/200ML-% IV SOLN
1000.0000 mg | Freq: Once | INTRAVENOUS | Status: AC
  Administered 2024-01-24: 1000 mg via INTRAVENOUS
  Filled 2024-01-24: qty 200

## 2024-01-24 MED ORDER — MORPHINE SULFATE (PF) 4 MG/ML IV SOLN
4.0000 mg | Freq: Once | INTRAVENOUS | Status: AC
  Administered 2024-01-24: 4 mg via INTRAVENOUS
  Filled 2024-01-24: qty 1

## 2024-01-24 MED ORDER — SODIUM CHLORIDE 0.9 % IV SOLN
2.0000 g | Freq: Once | INTRAVENOUS | Status: AC
  Administered 2024-01-24: 2 g via INTRAVENOUS
  Filled 2024-01-24: qty 12.5

## 2024-01-24 MED ORDER — IOHEXOL 300 MG/ML  SOLN
80.0000 mL | Freq: Once | INTRAMUSCULAR | Status: AC | PRN
  Administered 2024-01-24: 80 mL via INTRAVENOUS

## 2024-01-24 NOTE — ED Provider Notes (Signed)
 Clinical Course as of 01/26/24 0012  Sat Jan 24, 2024  1500 Spoke with Dr. Cleotilde from orthopedic surgery recommending transfer to [facility], awaiting call back from them at this time. [SK]  1505 Periprosthetic fracture: surgery done in July total knee placement with Dr. Ellan. Irrigation done in September. Fell this morning. Concerned possible drainage, possible septic arthritis vs cellulitis. [place] [facility] paged.   [HD]  1524 I spoke with Dr. Jennye from orthopedic surgery at [facility], he reviewed the chart and compared the imaging.  Given the findings, felt no acute fracture as the imaging looked unchanged from prior.  I discussed the findings of the drainage, he feels this is likely consistent with a chronic infection and if he does not appear systemically ill does not warrant transfer.  We are following up blood work here at this time, care of patient transferred to Dr. Nicholaus at this time. [SK]  1544 WBC(!): 17.5 Elevated white blood cell count [HD]  1612 Potassium is 6.4, unclear whether this is hemolyzed and not we will get an EKG and a repeat potassium.  He is already receiving fluids [HD]  1646 EKG and rhythm strip are interpreted by myself:   EKG: [tachycardic sinus  rhythm] at heart rate of 101, normal QRS duration, QTc 391, nonspecific ST segments and T waves no ectopy EKG not consistent with Acute STEMI Rhythm strip: tachycardic sinus rhythm in lead II  Patient does not have peaked T waves in V2 they are slightly more peaked in V3 and V4 however these seem consistent with his prior EKG in November, repeat potassium has been sent but is pending  [HD]  1712 Potassium is truly elevated.  Will start calcium  gluconate insulin , leukemia and glucose [HD]  1714 Will call back over to [facility] for transfer [HD]  1830 Patient reassessed.  Interventions ongoing.  I have not had a callback yet from [facility].  Secretary is aware [HD]  [id number] Patient reassessed continues to complain  of right knee pain.  Morphine  ordered [HD]  1908 No callback yet from [facility] orth[facility]ics [HD]  1928 Case discussed with orthopedic consultant at [facility] [place] on-call for Dr. Cristy, agrees for transfer over but will need hospitalist to admit and they will consult and follow along.  Going back through the transfer center [HD]  1937 Case discussed with transfer center and they are paging out the hospitalist [HD]  2008 Basic metabolic panel(!!) Elevated potassium again despite interventions, we will give him some Lasix , another round of insulin  and glucose and another round of calcium  gluconate albuterol  [HD]  2116 Patient accepted by hospitalist at [facility] Dr. Marcene [HD]  7668 Potassium: 4.9 [HD]  Sun Jan 25, 2024  1537 Received signout: repeating BMP. Abx per pharmacy, awaiting transfer   [HD]  1622 Potassium: 4.7 Not elevated [HD]  1639 Pulled that out there is no room at [facility] tonight patient will likely stay through the morning.  Will consult hospitalist to help manage care while awaiting transfer [HD]  1709 Case discussed with hospitalist for co-management.  [HD]  Mon Jan 26, 2024  0011 Case signed out to oncoming physician at 11:05 PM [HD]    Clinical Course User Index [HD] Nicholaus Rolland BRAVO, MD [SK] Fernand Rossie HERO, MD   .Critical Care  Performed by: Nicholaus Rolland BRAVO, MD Authorized by: Nicholaus Rolland BRAVO, MD   Critical care provider statement:    Critical care time (minutes):  75   Critical  care was necessary to treat or prevent imminent or life-threatening deterioration of the following conditions:  Sepsis and endocrine crisis   Critical care was time spent personally by me on the following activities:  Development of treatment plan with patient or surrogate, discussions with consultants, evaluation of patient's response to treatment, examination of patient, ordering and review of laboratory studies, ordering and review of radiographic studies, ordering and performing  treatments and interventions, pulse oximetry, re-evaluation of patient's condition and review of old charts   Care discussed with: admitting provider   Comments:     Sepsis, need for frequent rechecks of potassium, discussion with outside consultants and need for transfer       Nicholaus Rolland BRAVO, MD 01/24/24 2225

## 2024-01-24 NOTE — Progress Notes (Signed)
 CODE SEPSIS - PHARMACY COMMUNICATION  **Broad Spectrum Antibiotics should be administered within 1 hour of Sepsis diagnosis**  Time Code Sepsis Called/Page Received: 1430  Antibiotics Ordered: vancomycin , cefepime , metronidazole   Time of 1st antibiotic administration: 1541  Additional action taken by pharmacy: Messaged nurse  If necessary, Name of Provider/Nurse Contacted: Rock Monica, RN    Lum VEAR Mania ,PharmD Clinical Pharmacist  01/24/2024  2:30 PM

## 2024-01-24 NOTE — ED Provider Triage Note (Signed)
 Emergency Medicine Provider Triage Evaluation Note  JAVIONE GUNAWAN , a 78 y.o. male  was evaluated in triage.  Pt complains of pain in his right lower extremity today.  He tripped last night and fell when going to the bathroom.  He denies striking his head or losing consciousness.  No headache today  Physical Exam  BP (!) 175/88 (BP Location: Left Arm)   Pulse (!) 101   Temp 97.8 F (36.6 C) (Oral)   Resp 18   Ht 5' 8 (1.727 m)   Wt 59 kg   SpO2 100%   BMI 19.78 kg/m  Gen:   Awake, no distress   Resp:  Normal effort  MSK:   Moves extremities without difficulty  Other:    Medical Decision Making  Medically screening exam initiated at 12:28 PM.  Appropriate orders placed.  MARCIO HOQUE was informed that the remainder of the evaluation will be completed by another provider, this initial triage assessment does not replace that evaluation, and the importance of remaining in the ED until their evaluation is complete.    Herlinda Kirk NOVAK, FNP 01/24/24 1609

## 2024-01-24 NOTE — ED Provider Notes (Addendum)
 "  [facility] Provider Note    Event Date/Time   First MD Initiated Contact with Patient 01/24/24 1421     (approximate)   History   No chief complaint on file.   HPI  Joe Coleman is a 78 y.o. male presenting with concern of knee injury.  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  This morning he had a mechanical fall and since then significant pain.  He states that generally he would take his home pain medication but his primary doctor told him that he no longer would be getting any pain medication so he has not had anything to help manage the pain.  Denying any other accompanying symptoms primarily pain along his right knee.     Physical Exam   Triage Vital Signs: ED Triage Vitals  Encounter Vitals Group     BP 01/24/24 1220 (!) 175/88     Girls Systolic BP Percentile --      Girls Diastolic BP Percentile --      Boys Systolic BP Percentile --      Boys Diastolic BP Percentile --      Pulse Rate 01/24/24 1220 (!) 101     Resp 01/24/24 1220 18     Temp 01/24/24 1220 97.8 F (36.6 C)     Temp Source 01/24/24 1220 Oral     SpO2 01/24/24 1220 100 %     Weight 01/24/24 1226 130 lb 1.1 oz (59 kg)     Height 01/24/24 1226 5' 8 (1.727 m)     Head Circumference --      Peak Flow --      Pain Score 01/24/24 1226 10     Pain Loc --      Pain Education --      Exclude from Growth Chart --     Most recent vital signs: Vitals:   01/24/24 1220  BP: (!) 175/88  Pulse: (!) 101  Resp: 18  Temp: 97.8 F (36.6 C)  SpO2: 100%     General: Awake, no distress.  CV:  Good peripheral perfusion.  Resp:  Normal effort.  Abd:  No distention.   MSK:  Right knee appears swollen and erythematous, there is an open wound with some yellow discharge coming from the area, no foul smell is noted.  He has appropriate sensation of the remaining foot but some limited range of motion secondary to pain with motion of the right knee.  Appropriate pulses Other:     ED Results / Procedures / Treatments   Labs (all labs ordered are listed, but only abnormal results are displayed) Labs Reviewed  RESP PANEL BY RT-PCR (RSV, FLU A&B, COVID)  [id number]  CULTURE, BLOOD (ROUTINE X 2)  CULTURE, BLOOD (ROUTINE X 2)  LACTIC ACID, PLASMA  LACTIC ACID, PLASMA  COMPREHENSIVE METABOLIC PANEL WITH GFR  CBC WITH DIFFERENTIAL/PLATELET  PROTIME-INR  SEDIMENTATION RATE  C-REACTIVE PROTEIN     EKG     RADIOLOGY   PROCEDURES:  Critical Care performed: Yes, see critical care procedure note(s)  .Critical Care  Performed by: Fernand Rossie HERO, MD Authorized by: Fernand Rossie HERO, MD  Critical care provider statement:    Critical care time (minutes):  35   Critical care was necessary to treat or prevent imminent or life-threatening deterioration of the following conditions:  Sepsis   Critical care was time spent personally by me on the following activities:  Development of treatment plan with patient or surrogate, discussions with consultants, evaluation of patient's response to treatment, examination of patient, ordering and review of laboratory studies, ordering and review of radiographic studies, ordering and performing treatments and interventions, pulse oximetry, re-evaluation of patient's condition and review of old charts    MEDICATIONS ORDERED IN ED: Medications  morphine  (PF) 4 MG/ML injection 4 mg (has no administration in time range)  lactated ringers  infusion (has no administration in time range)  ceFEPIme  (MAXIPIME ) 2 g in sodium chloride  0.9 % 100 mL IVPB (has no administration in time range)  metroNIDAZOLE  (FLAGYL ) IVPB 500 mg (has no  administration in time range)  vancomycin  (VANCOCIN ) IVPB 1000 mg/200 mL premix (has no administration in time range)     IMPRESSION / MDM / ASSESSMENT AND PLAN / ED COURSE  I reviewed the triage vital signs and the nursing notes.                               Patient's presentation is most consistent with acute presentation with potential threat to life or bodily function.  78 year old male who presents today with concern of knee injury and pain.  Prior history of a total knee replacement now with significant pain along the area of the knee after a fall today.  It appears to be consistent with cellulitis overlying the area and questionable underlying infection.  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  Procedure was done with [name] at [facility], will reach out to the orthopedic group and discussed management, will initiate antibiotics in the interim.   Clinical Course as of 01/24/24 1530  Sat Jan 24, 2024  1500 Spoke with Dr. Cleotilde from orthopedic surgery recommending transfer to [facility], awaiting call back from them at this time. [SK]  1505 Periprosthetic fracture: surgery done in July total knee placement with Dr. Ellan. Irrigation done in September. Fell this morning. Concerned possible drainage, possible septic arthritis vs cellulitis. [facility] paged.   [HD]  1524 I spoke with Dr. Jennye from orthopedic surgery at [facility], he reviewed the chart and compared the imaging.  Given the findings, felt no acute fracture as the imaging looked unchanged from prior.  I discussed the findings of the drainage, he feels this is likely consistent with a chronic infection and if he does not appear systemically ill does not warrant transfer.  We are following up blood work here at this time, care of patient transferred to Dr. Nicholaus at this time. [SK]    Clinical Course User Index [HD] Nicholaus Rolland BRAVO,  MD [SK] Fernand Rossie HERO, MD     FINAL CLINICAL IMPRESSION(S) / ED DIAGNOSES   Final diagnoses:  Periprosthetic fracture of proximal end of tibia, initial encounter     Rx / DC Orders   ED Discharge Orders     None        Note:  This document was prepared using Dragon voice recognition software and may include unintentional dictation errors.   Fernand Rossie HERO, MD 01/24/24 1518    Fernand Rossie HERO, MD 01/24/24 1530  "

## 2024-01-24 NOTE — ED Provider Notes (Signed)
----------------------------------------- °  11:43 PM on 01/24/2024 -----------------------------------------  Blood pressure (!) 163/68, pulse (!) 113, temperature 98.4 F (36.9 C), temperature source Oral, resp. rate 19, height 5' 8 (1.727 m), weight 59 kg, SpO2 97%.  Assuming care from Dr. Nicholaus.  In short, Joe Coleman is a 78 y.o. male with a chief complaint of knee pain.  Refer to the original H&P for additional details.  The current plan of care is to transfer to [facility] for continuity of care for septic joint.  ----------------------------------------- 7:50 AM on 01/25/2024 ----------------------------------------- No events noted overnight, patient continues to await bed availability at [facility].  Medications  lactated ringers  infusion (1,000 mLs Intravenous New Bag/Given 01/24/24 1539)  morphine  (PF) 4 MG/ML injection 4 mg (4 mg Intravenous Given 01/24/24 1540)  ceFEPIme  (MAXIPIME ) 2 g in sodium chloride  0.9 % 100 mL IVPB (0 g Intravenous Stopped 01/24/24 1611)  metroNIDAZOLE  (FLAGYL ) IVPB 500 mg (0 mg Intravenous Stopped 01/24/24 1641)  vancomycin  (VANCOCIN ) IVPB 1000 mg/200 mL premix (0 mg Intravenous Stopped 01/24/24 1813)  iohexol  (OMNIPAQUE ) 300 MG/ML solution 80 mL (80 mLs Intravenous Contrast Given 01/24/24 1609)  calcium  gluconate 1 g/ 50 mL sodium chloride  IVPB (0 mg Intravenous Stopped 01/24/24 1935)  sodium zirconium cyclosilicate  (LOKELMA ) packet 10 g (10 g Oral Given 01/24/24 1924)  insulin  aspart (novoLOG ) injection 10 Units (10 Units Intravenous Given 01/24/24 1819)    And  dextrose  50 % solution 50 mL (50 mLs Intravenous Given 01/24/24 1820)  morphine  (PF) 4 MG/ML injection 4 mg (4 mg Intravenous Given 01/24/24 1925)  furosemide  (LASIX ) injection 40 mg (40 mg Intravenous Given 01/24/24 2021)  insulin  aspart (novoLOG ) injection 10 Units (10 Units Intravenous Given 01/24/24 2024)    And  dextrose  50 % solution 50 mL (50 mLs Intravenous Given 01/24/24 2024)   albuterol  (PROVENTIL ) (2.5 MG/3ML) 0.083% nebulizer solution 10 mg (10 mg Nebulization Given 01/24/24 2027)  calcium  gluconate 1 g/ 50 mL sodium chloride  IVPB (0 mg Intravenous Stopped 01/24/24 2121)  sodium chloride  0.9 % bolus 1,000 mL (1,000 mLs Intravenous New Bag/Given 01/24/24 2258)     ED Discharge Orders     None      Final diagnoses:  Periprosthetic fracture of proximal end of tibia, initial encounter      Willo Dunnings, MD 01/25/24 [phone]

## 2024-01-24 NOTE — ED Notes (Signed)
 See triage note  Presents with pain to right leg  States this happened I  June  Had surgery  But cont's to have pain  Denies fever

## 2024-01-24 NOTE — ED Triage Notes (Signed)
 First nurse note. Pt to ED via [facility] from home. Pt reports 10/10 right leg pain since waking up. Wounds on bilateral legs and swollen. Pt took 5mg  oxy at 5am  CBG 195 192/87 HR 100 97% RA

## 2024-01-24 NOTE — ED Notes (Signed)
 [facility] called per Dr Fernand for Ortho consult, spoke with Tinnie.

## 2024-01-24 NOTE — ED Notes (Signed)
 See triage note   States he has had problems with right leg   Had surgery in June   Lives alone  Conts' to have pain  Denies any at home

## 2024-01-24 NOTE — Progress Notes (Incomplete)
 Hospitalist Transfer Note:    Nursing staff, Please call [facility] Admits & Consults System-Wide number on Amion [phone]) as soon as patient's arrival, so appropriate admitting provider can evaluate the pt.  Transferring facility: [facility] ED Requesting provider: Dr. Rolland Moats (EDP at [facility]) Reason for transfer: admission for further evaluation and management of septic right knee. ***  ***  (78 y.o. M with h/o DM2, HTN,    who is being transferred from  *** to [facility] or [facility] *** (first available) for  ***; please see my progress note for additional details).     ***  year old *** with medical history notable for ***, who presented to *** ED complaining of  ***.   Vital signs in the ED were notable for the following: ***  Labs were notable for ***  Imaging notable for ***  Medications administered prior to transfer included the following: ***    Subsequently, I accepted this patient for transfer for ***observation/inpatient admission to a *** bed at [facility] or [facility] *** (first available) for further work-up and management of the above.   ***Reason/necessity for transfer on the basis of need for higher level of care and availability of specialist providers         Eva Pore, DO Hospitalist

## 2024-01-24 NOTE — Sepsis Progress Note (Signed)
 Sepsis protocol is being followed by [facility].

## 2024-01-24 NOTE — Progress Notes (Signed)
 Hospitalist Transfer Note:    Nursing staff, Please call [facility] Admits & Consults System-Wide number on Amion [phone]) as soon as patient's arrival, so appropriate admitting provider can evaluate the pt.  Transferring facility: [facility] ED Requesting provider: Dr. Rolland Moats (EDP at [facility]) Reason for transfer: admission for further evaluation and management of suspected septic right knee. Patient also noted to have hyperkalemia.    78 y.o. M with h/o DM2, HTN, proximal right tibia fracture in July 2025, status post ORIF with [name] of [facility], with ensuing right knee hardware removal and washout in September 2025, who presented to [facility] ED complaining of progressive right knee pain over the last day associated with erythema and increased swelling. Patient was reported to have had a recent preceding fall.   Vital signs in the ED were notable for the following: Afebrile; heart rates in the 90s to low 100s; systolic blood pressures in the 160s to 170s; respiratory rate 18-20, and oxygen saturation 97 to 100% on room air.   Labs were notable for CBC showing white blood cell count 17,500.  CRP 9.1.  ESR 45.  Lactic acid 1.6.  Procalcitonin 0.52.  Blood cultures were drawn.  CMP initially showed potassium level 6.4, creatinine 1.31.  Upon repeat BMP, potassium level 6.6.  EKG is not reported to show findings consistent with hyperkalemia.   The patient subsequently received Lokelma  10 g p.o. x 1, NovoLog  10 units IV with amp D50 x 2 occurrences, Lasix  40 mg IV x 1 dose,  calcium  gluconate 1 g IV x 2 occurrences, normal saline x 1 L bolus, continuous lactated Ringer 's at 150 cc/h.    Following these measures, repeat bmp shows updated potassium level of 4.9.   Imaging notable for CT of the right knee with contrast, with findings that include a moderate to large thick-walled joint effusion with synovial enhancement, potentially inflammatory versus infectious per radiology, as well as  generalized soft tissue edema without evidence of subcutaneous gas and e/o abscess.   [facility] EDP has discussed with Dr. Cristy, who is on-call for [facility], who requested hospitalist admission to [facility] and conveyed the [facility] will formally consult.   Medications administered prior to transfer also included the following: morphine  4 mg IV x 2 doses, IV vancomycin , cefepime , IV Flagyl .   Subsequently, I accepted this patient for transfer for inpatient admission to a PCU bed at [facility] for further work-up and management of the above.   Reason/necessity for transfer on the basis of availability of specialist providers, namely availability of pt's established orthopedic surgeon, to ensure continuity of care.       Joe Pore, DO Hospitalist

## 2024-01-24 NOTE — ED Triage Notes (Signed)
 Pt endorses right leg pain that started today. States he had an injury to it this summer after getting it caught in door. States he tripped on his blanket last night and fell when going to bathroom.

## 2024-01-25 DIAGNOSIS — S8991XA Unspecified injury of right lower leg, initial encounter: Secondary | ICD-10-CM | POA: Diagnosis not present

## 2024-01-25 LAB — CREATININE, SERUM
Creatinine, Ser: 1.45 mg/dL — ABNORMAL HIGH (ref 0.61–1.24)
GFR, Estimated: 49 mL/min — ABNORMAL LOW

## 2024-01-25 LAB — BASIC METABOLIC PANEL WITH GFR
Anion gap: 10 (ref 5–15)
BUN: 20 mg/dL (ref 8–23)
CO2: 23 mmol/L (ref 22–32)
Calcium: 8.4 mg/dL — ABNORMAL LOW (ref 8.9–10.3)
Chloride: 101 mmol/L (ref 98–111)
Creatinine, Ser: 1.39 mg/dL — ABNORMAL HIGH (ref 0.61–1.24)
GFR, Estimated: 51 mL/min — ABNORMAL LOW
Glucose, Bld: 156 mg/dL — ABNORMAL HIGH (ref 70–99)
Potassium: 4.7 mmol/L (ref 3.5–5.1)
Sodium: 134 mmol/L — ABNORMAL LOW (ref 135–145)

## 2024-01-25 LAB — CBG MONITORING, ED: Glucose-Capillary: 162 mg/dL — ABNORMAL HIGH (ref 70–99)

## 2024-01-25 MED ORDER — VANCOMYCIN HCL 1750 MG/350ML IV SOLN: 1750.0000 mg | Freq: Two times a day (BID) | INTRAVENOUS | Status: DC

## 2024-01-25 MED ORDER — INSULIN ASPART 100 UNIT/ML IJ SOLN
0.0000 [IU] | Freq: Three times a day (TID) | INTRAMUSCULAR | Status: DC
  Administered 2024-01-26: 2 [IU] via SUBCUTANEOUS
  Filled 2024-01-25: qty 2

## 2024-01-25 MED ORDER — INSULIN ASPART 100 UNIT/ML IJ SOLN: 0.0000 [IU] | Freq: Every day | INTRAMUSCULAR | Status: DC

## 2024-01-25 MED ORDER — PANTOPRAZOLE SODIUM 40 MG PO TBEC
40.0000 mg | DELAYED_RELEASE_TABLET | Freq: Every day | ORAL | Status: DC
  Administered 2024-01-26: 40 mg via ORAL
  Filled 2024-01-25: qty 1

## 2024-01-25 MED ORDER — POLYETHYLENE GLYCOL 3350 17 G PO PACK
17.0000 g | PACK | Freq: Every day | ORAL | Status: DC
  Administered 2024-01-26: 17 g via ORAL
  Filled 2024-01-25: qty 1

## 2024-01-25 MED ORDER — SODIUM CHLORIDE 0.9 % IV SOLN
1.0000 g | Freq: Two times a day (BID) | INTRAVENOUS | Status: DC
  Administered 2024-01-25: 1 g via INTRAVENOUS
  Filled 2024-01-25: qty 10

## 2024-01-25 MED ORDER — TAMSULOSIN HCL 0.4 MG PO CAPS
0.4000 mg | ORAL_CAPSULE | Freq: Every day | ORAL | Status: DC
  Administered 2024-01-26: 0.4 mg via ORAL
  Filled 2024-01-25: qty 1

## 2024-01-25 MED ORDER — SODIUM CHLORIDE 0.9 % IV SOLN
1.0000 g | Freq: Once | INTRAVENOUS | Status: AC
  Administered 2024-01-25: 1 g via INTRAVENOUS
  Filled 2024-01-25: qty 10

## 2024-01-25 MED ORDER — IPRATROPIUM-ALBUTEROL 0.5-2.5 (3) MG/3ML IN SOLN: 3.0000 mL | Freq: Four times a day (QID) | RESPIRATORY_TRACT | Status: DC | PRN

## 2024-01-25 MED ORDER — SODIUM CHLORIDE 0.9 % IV SOLN
2.0000 g | Freq: Two times a day (BID) | INTRAVENOUS | Status: DC
  Administered 2024-01-25 – 2024-01-26 (×3): 2 g via INTRAVENOUS
  Filled 2024-01-25 (×3): qty 12.5

## 2024-01-25 MED ORDER — VANCOMYCIN HCL 750 MG/150ML IV SOLN
750.0000 mg | Freq: Once | INTRAVENOUS | Status: AC
  Administered 2024-01-25: 750 mg via INTRAVENOUS
  Filled 2024-01-25: qty 150

## 2024-01-25 MED ORDER — HYDRALAZINE HCL 20 MG/ML IJ SOLN: 10.0000 mg | Freq: Four times a day (QID) | INTRAMUSCULAR | Status: DC | PRN

## 2024-01-25 MED ORDER — VANCOMYCIN HCL 10 G IV SOLR: 15.0000 mg/kg | Freq: Two times a day (BID) | INTRAVENOUS | Status: DC

## 2024-01-25 MED ORDER — OXYCODONE-ACETAMINOPHEN 5-325 MG PO TABS
1.0000 | ORAL_TABLET | Freq: Four times a day (QID) | ORAL | Status: DC | PRN
  Administered 2024-01-26: 1 via ORAL
  Filled 2024-01-25: qty 1

## 2024-01-25 MED ORDER — AMLODIPINE BESYLATE 5 MG PO TABS
5.0000 mg | ORAL_TABLET | Freq: Every day | ORAL | Status: DC
  Administered 2024-01-25 – 2024-01-26 (×2): 5 mg via ORAL
  Filled 2024-01-25 (×2): qty 1

## 2024-01-25 MED ORDER — VANCOMYCIN HCL 750 MG/150ML IV SOLN
750.0000 mg | INTRAVENOUS | Status: DC
  Administered 2024-01-25: 750 mg via INTRAVENOUS
  Filled 2024-01-25 (×2): qty 150

## 2024-01-25 MED ORDER — ACETAMINOPHEN 325 MG PO TABS
975.0000 mg | ORAL_TABLET | Freq: Once | ORAL | Status: AC
  Administered 2024-01-25: 975 mg via ORAL
  Filled 2024-01-25: qty 3

## 2024-01-25 MED ORDER — GABAPENTIN 300 MG PO CAPS
300.0000 mg | ORAL_CAPSULE | Freq: Every day | ORAL | Status: DC
  Administered 2024-01-25: 300 mg via ORAL
  Filled 2024-01-25: qty 1

## 2024-01-25 NOTE — ED Notes (Signed)
 Called [facility] spoke with Luke, pt still waiting on a bed assignment

## 2024-01-25 NOTE — ED Provider Notes (Signed)
 Clinical Course as of 01/26/24 0011  2116 Patient accepted by hospitalist at [facility] [address]. Marcene [HD]  [id number] Potassium: 4.9 [HD]  Sun Jan 25, 2024  1537 Received signout: repeating BMP. Abx per pharmacy, awaiting transfer   [HD]  1622 Potassium: 4.7 Not elevated [HD]  1639 Pulled that out there is no room at [facility] tonight patient will likely stay through the morning.  Will consult hospitalist to help manage care while awaiting transfer [HD]  1709 Case discussed with hospitalist for co-management.  [HD]    Clinical Course User Index [HD] Nicholaus Rolland BRAVO, MD [SK] Fernand Rossie HERO, MD   Case signed out to oncoming physician at 11 PM   Nicholaus Rolland BRAVO, MD 01/26/24 [phone]

## 2024-01-25 NOTE — Progress Notes (Signed)
 Pharmacy Antibiotic Note  Joe Coleman is a 78 y.o. male admitted on 01/24/2024 with sepsis/?septic knee.  Pharmacy has been consulted for Vancomycin , Cefepime  dosing.  Plan: Scr 1.32 >> 1.45  -continue Cefepime  2 gm IV Q12H   -Vancomycin  1 gm IV X 1 given in ED on 12/20 @ 1713.  Additional Vanc 750 mg IV ordered for 12/21 @ 0230 to make total dose of 1750 mg.  Will adjust Vancomycin  from 1750 mg IV Q12H  to 750 mg IV q24h  Goal AUC 400-550. Expected AUC: 542 Cmin 15.4 SCr used: 1.45   Continue to assess renal fxn, cultures, length of therapy, etc    Height: 5' 8 (172.7 cm) Weight: 59 kg (130 lb 1.1 oz) IBW/kg (Calculated) : 68.4  Temp (24hrs), Avg:98.4 F (36.9 C), Min:98 F (36.7 C), Max:99.1 F (37.3 C)  Recent Labs  Lab 01/24/24 1508 01/24/24 1510 01/24/24 1837 01/24/24 2220 01/25/24 1140  WBC  --  17.5*  --   --   --   CREATININE  --  1.31* 1.38* 1.32* 1.45*  LATICACIDVEN 1.2  --  1.6  --   --     Estimated Creatinine Clearance: 33.9 mL/min (A) (by C-G formula based on SCr of 1.45 mg/dL (H)).    Allergies[1]  Antimicrobials this admission:  Metronidazole  x 1 12/20 Cefepime  12/20 >>   Vancomycin  12/20>>    Dose adjustments this admission:   Microbiology results: 12/20 BCx: NG<24 hrs  UCx:    Sputum:    MRSA PCR:   Thank you for allowing pharmacy to be a part of this patients care.  Allean Haas PharmD Clinical Pharmacist 01/25/2024       [1]  Allergies Allergen Reactions   Losartan Other (See Comments)    Hyperkalemia (>5.5 multiple times on losartan 25mg )   Chicken Allergy Nausea And Vomiting   Poultry Meal Nausea And Vomiting

## 2024-01-25 NOTE — Progress Notes (Signed)
 Pharmacy Antibiotic Note  Joe Coleman is a 78 y.o. male admitted on 01/24/2024 with sepsis.  Pharmacy has been consulted for Vancomycin , Cefepime  dosing.  Plan: Cefepime  1 gm IV X 1 given in ED on 12/21 @ 0108.  Additional Cefepime  1 gm IV X 1 ordered for 12/21 @ ~ 0200 to make total dose of 2 gm.   Cefepime  2 gm IV Q12H ordered to start on 12/21 @ 1300.  Vancomycin  1 gm IV X 1 given in ED on 12/20 @ 1713.  Additional Vanc 750 mg IV ordered for 12/21 @ 0230 to make total dose of 1750 mg.   Vancomycin  1750 mg IV Q12H ordered to start on 12/22 @ 1700.   AUC = 511.9 Vanc trough = 7.6     Height: 5' 8 (172.7 cm) Weight: 59 kg (130 lb 1.1 oz) IBW/kg (Calculated) : 68.4  Temp (24hrs), Avg:98.2 F (36.8 C), Min:97.8 F (36.6 C), Max:98.5 F (36.9 C)  Recent Labs  Lab 01/24/24 1508 01/24/24 1510 01/24/24 1837 01/24/24 2220  WBC  --  17.5*  --   --   CREATININE  --  1.31* 1.38* 1.32*  LATICACIDVEN 1.2  --  1.6  --     Estimated Creatinine Clearance: 37.2 mL/min (A) (by C-G formula based on SCr of 1.32 mg/dL (H)).    Allergies[1]  Antimicrobials this admission:   >>    >>   Dose adjustments this admission:   Microbiology results:  BCx:   UCx:    Sputum:    MRSA PCR:   Thank you for allowing pharmacy to be a part of this patients care.  Charlean Carneal D 01/25/2024 2:27 AM     [1]  Allergies Allergen Reactions   Losartan Other (See Comments)    Hyperkalemia (>5.5 multiple times on losartan 25mg )   Chicken Allergy Nausea And Vomiting   Poultry Meal Nausea And Vomiting

## 2024-01-25 NOTE — ED Notes (Signed)
 Pt complaining of intermittent chest pain in the upper left region right above his breast area. Patient requested pain medication, EDP, Fernand was made aware.

## 2024-01-25 NOTE — Consult Note (Signed)
 "  Hospitalist consultation note   Joe Coleman FMW:[id number] DOB: May 25, 1945 DOA: 01/24/2024  DOS: the patient was seen and examined on 01/24/2024  PCP: [facility]   Consultation requested: Dr. [name], ED physician  Patient coming from: Home  I have personally briefly reviewed patient's old medical records in [facility]  Chief Complaint: Right knee pain, swelling and drainage of the pus  HPI: Joe Coleman is a pleasant 78 y.o. male with medical history significant for DM on metformin , HTN, proximal right tibia fracture in July 2025, s/p ORIF with [name] of sports medicine group, with ensuing right knee hardware removal and washout in September 2025, who presented to [facility] ED complaining of progressive right knee pain over the last day associated with erythema and increased swelling and drainage.  Patient stated that he had a recent fall.  Since he was here in the emergency room, he stated that his symptoms have improved.  Patient was being planned for transfer to [facility] for [name] service but there was no bed and patient was waiting in the ED for more than 24 hours.  ED requested for comanagement consult for hospitalist service.  ED Course: Upon arrival to the ED, patient is found to be afebrile, heart rate in 90s to low 100, systolic blood pressure in 160s to 170s, respiratory rate 18-20, oxygen saturation 97 to 100% on room air.  CBC was showing white count 17,500, CRP 9.1, ESR 45, lactic acid 1.6, procalcitonin 0.52, blood cultures were drawn.  CMP initially showed potassium level of 6.4, creatinine 1.31, upon repeat BMP normalized after treatment.  EKG did not show any findings consistent with hyperkalemia.  Patient received Lokelma , NovoLog , and D50, Lasix , calcium  gluconate, normal saline 1 L bolus, lactated Ringer 's 150 cc/h.  Patient's hyperkalemia has resolved.  CT of the right knee with contrast showed moderate to large thick-walled joint  effusion with synovitis enhancement potentially inflammatory versus infectious per radiology as well as generalized soft tissue edema without evidence of subcutaneous gas or abscess.  [facility] EDP discussed with Dr. Cristy who was on-call for sports medicine group who requested hospitalist admission to [facility] and conveyed this peds medical group who will formally consult. Patient was given vancomycin , cefepime , IV Flagyl .  Patient was accepted at [facility], PCU bed by hospitalist team.  But there was no bed and patient was still in the emergency room at [facility]. ED provider Dr. Kreg Moats requested hospitalist consult for comanagement while patient is in our ED at [facility].  Review of Systems:  ROS  All other systems negative except as noted in the HPI.  Past Medical History:  Diagnosis Date   Cancer (HCC)    stomach and colon   Diabetes mellitus without complication (HCC)    Type II   Hypertension     Past Surgical History:  Procedure Laterality Date   APPENDECTOMY     COLON SURGERY     HARDWARE REMOVAL Right 10/15/2023   Procedure: REMOVAL, HARDWARE;  Surgeon: [name] Franky SQUIBB, MD;  Location: [facility] OR;  Service: Orthopedics;  Laterality: Right;  IRRIGATION AND DEBRIDEMENT, HARDWARE REMOVAL   ORIF TIBIA FRACTURE Right 08/25/2023   Procedure: OPEN REDUCTION INTERNAL FIXATION (ORIF) TIBIA FRACTURE, RIGHT;  Surgeon: [name] Franky SQUIBB, MD;  Location: [facility] OR;  Service: Orthopedics;  Laterality: Right;     reports that he has quit smoking. He has never used smokeless tobacco. He reports that he does not drink alcohol and  does not use drugs.  Allergies[1]  Family History  Problem Relation Age of Onset   Heart disease Mother    Heart disease Brother     Prior to Admission medications  Medication Sig Start Date End Date Taking? Authorizing Provider  acetaminophen  (TYLENOL ) 325 MG tablet Take 2 tablets (650 mg total) by mouth every 6 (six) hours as needed for mild pain (pain score 1-3) or fever (or Fever  >/= 101). 10/29/23   Josette Ade, MD  albuterol  (PROVENTIL  HFA;VENTOLIN  HFA) 108 (90 Base) MCG/ACT inhaler Inhale 2 puffs into the lungs every 6 (six) hours as needed for wheezing or shortness of breath. 04/01/16   Lang Dover, MD  apixaban  (ELIQUIS ) 5 MG TABS tablet Take 1 tablet (5 mg total) by mouth 2 (two) times daily. 12/22/23   Laurita Pillion, MD  bisacodyl  (DULCOLAX) 5 MG EC tablet Take 1 tablet (5 mg total) by mouth daily as needed for moderate constipation. 10/24/23   Tobie Yetta HERO, MD  cholecalciferol  (CHOLECALCIFEROL ) 25 MCG tablet Take 1 tablet (1,000 Units total) by mouth daily. 08/28/23   Danton Lauraine LABOR, PA-C  feeding supplement, GLUCERNA SHAKE, (GLUCERNA SHAKE) LIQD Take 237 mLs by mouth 3 (three) times daily between meals. 09/01/23   Sherrill Cable Latif, DO  gabapentin  (NEURONTIN ) 300 MG capsule Take 300 mg by mouth at bedtime.    [provider]  guaiFENesin  (MUCINEX ) 600 MG 12 hr tablet Take 1 tablet (600 mg total) by mouth 2 (two) times daily. 10/24/23 10/23/24  Tobie Yetta HERO, MD  metFORMIN  (GLUCOPHAGE ) 500 MG tablet Take 500 mg by mouth 2 (two) times daily with a meal. 12/16/23 08/02/24  [provider]  methocarbamol  (ROBAXIN ) 500 MG tablet Take 1 tablet (500 mg total) by mouth every 6 (six) hours as needed for muscle spasms. 10/24/23   Tobie Yetta HERO, MD  ondansetron  (ZOFRAN ) 4 MG tablet Take 1 tablet (4 mg total) by mouth every 6 (six) hours as needed for nausea. 10/29/23   Josette Ade, MD  oxyCODONE -acetaminophen  (PERCOCET/ROXICET) 5-325 MG tablet Take 1 tablet by mouth every 6 (six) hours as needed. 11/04/23   [provider]  pantoprazole  (PROTONIX ) 20 MG tablet Take 2 tablets (40 mg total) by mouth daily for 14 days. 12/05/23 12/19/23  Rogelia Jerilynn RAMAN, MD  polyethylene glycol (MIRALAX  / GLYCOLAX ) 17 g packet Take 17 g by mouth daily. 09/01/23   Sherrill Cable Latif, DO  sodium chloride  flush (NS) 0.9 % SOLN 10-40 mLs by Intracatheter  route every 12 (twelve) hours. Patient not taking: Reported on 12/20/2023 10/29/23   Josette Ade, MD  tamsulosin  (FLOMAX ) 0.4 MG CAPS capsule Take 1 capsule (0.4 mg total) by mouth daily. 12/23/23   Laurita Pillion, MD  TRELEGY RUFINO 200-62.5-25 MCG/ACT AEPB Inhale 1 puff into the lungs daily. 07/17/23   [provider]    Physical Exam: Vitals:   01/25/24 1130 01/25/24 1430 01/25/24 1704 01/25/24 1730  BP: (!) 179/84 (!) 140/79  (!) 146/78  Pulse: 96 95  97  Resp: (!) 22 18  16   Temp:   98.2 F (36.8 C)   TempSrc:   Oral   SpO2: 99% 99%  100%  Weight:      Height:        Physical Exam   Constitutional: Alert, awake, calm, comfortable HEENT: Neck supple Respiratory: Clear to auscultation B/L, no wheezing, no rales.  Cardiovascular: Regular rate and rhythm, no murmurs / rubs / gallops. No extremity edema. 2+ pedal pulses.  No carotid bruits.  Abdomen: Soft, no tenderness, Bowel sounds positive.  Musculoskeletal: Right knee is red, swollen, tender with a drainage of the pus.  Skin: no rashes, lesions, ulcers. Neurologic: CN 2-12 grossly intact. Sensation intact, No focal deficit identified Psychiatric: Alert and oriented x 3. Normal mood.    Labs on Admission: I have personally reviewed following labs and imaging studies  CBC: Recent Labs  Lab 01/24/24 1510  WBC 17.5*  NEUTROABS 14.6*  HGB 9.8*  HCT 32.4*  MCV 91.8  PLT 341   Basic Metabolic Panel: Recent Labs  Lab 01/24/24 1510 01/24/24 1611 01/24/24 1837 01/24/24 2220 01/25/24 1140 01/25/24 1528  NA 135  --  132* 132*  --  134*  K 6.4* 6.6* 6.6* 4.9  --  4.7  CL 104  --  100 101  --  101  CO2 23  --  19* 21*  --  23  GLUCOSE 171*  --  229* 80  --  156*  BUN 18  --  18 16  --  20  CREATININE 1.31*  --  1.38* 1.32* 1.45* 1.39*  CALCIUM  8.9  --  8.4* 8.6*  --  8.4*   GFR: Estimated Creatinine Clearance: 35.4 mL/min (A) (by C-G formula based on SCr of 1.39 mg/dL (H)). Liver Function  Tests: Recent Labs  Lab 01/24/24 1510  AST 20  ALT 6  ALKPHOS 174*  BILITOT 0.5  PROT 7.1  ALBUMIN  3.8   No results for input(s): LIPASE, AMYLASE in the last 168 hours. No results for input(s): AMMONIA in the last 168 hours. Coagulation Profile: Recent Labs  Lab 01/24/24 1510  INR 1.0   Cardiac Enzymes: No results for input(s): CKTOTAL, CKMB, CKMBINDEX, TROPONINI, TROPONINIHS in the last 168 hours. BNP (last 3 results) No results for input(s): BNP in the last 8760 hours. HbA1C: No results for input(s): HGBA1C in the last 72 hours. CBG: Recent Labs  Lab 01/24/24 2034 01/24/24 2114  GLUCAP 241* 126*   Lipid Profile: No results for input(s): CHOL, HDL, LDLCALC, TRIG, CHOLHDL, LDLDIRECT in the last 72 hours. Thyroid Function Tests: No results for input(s): TSH, T4TOTAL, FREET4, T3FREE, THYROIDAB in the last 72 hours. Anemia Panel: No results for input(s): VITAMINB12, FOLATE, FERRITIN, TIBC, IRON , RETICCTPCT in the last 72 hours. Urine analysis:    Component Value Date/Time   COLORURINE YELLOW 12/05/2023 1045   APPEARANCEUR CLEAR 12/05/2023 1045   LABSPEC 1.012 12/05/2023 1045   PHURINE 6.0 12/05/2023 1045   GLUCOSEU NEGATIVE 12/05/2023 1045   HGBUR NEGATIVE 12/05/2023 1045   BILIRUBINUR NEGATIVE 12/05/2023 1045   KETONESUR NEGATIVE 12/05/2023 1045   PROTEINUR NEGATIVE 12/05/2023 1045   NITRITE NEGATIVE 12/05/2023 1045   LEUKOCYTESUR NEGATIVE 12/05/2023 1045    Radiological Exams on Admission: I have personally reviewed images CT KNEE RIGHT W CONTRAST Result Date: 01/24/2024 CLINICAL DATA:  Provided history: looks like an infected right knee s/p recent washout. now with purulent drainainge EXAM: CT OF THE RIGHT KNEE WITH CONTRAST TECHNIQUE: Multidetector CT imaging was performed following the standard protocol during bolus administration of intravenous contrast. RADIATION DOSE REDUCTION: This exam was performed  according to the departmental dose-optimization program which includes automated exposure control, adjustment of the mA and/or kV according to patient size and/or use of iterative reconstruction technique. CONTRAST:  80mL OMNIPAQUE  IOHEXOL  300 MG/ML  SOLN COMPARISON:  Radiograph earlier today, additional prior radiographs reviewed. FINDINGS: Bones/Joint/Cartilage Knee arthroplasty in expected alignment. Prior patellar resurfacing. Comminuted proximal tibial fracture extends to the  lateral aspect of the tibial tray as well as the cement adjacent to the tibial stem. Comminuted metaphyseal and diaphyseal component which is not entirely included in the field of view. There is no bony bridging. Proximal fibular fracture has incomplete surrounding callus formation. Moderate to large thick-walled joint effusion with synovial enhancement. Ligaments Suboptimally assessed by CT. Muscles and Tendons No intramuscular collection. Slightly indistinct fatty striations in the anterior compartment adjacent to tibial fracture. Soft tissues Generalized soft tissue edema. No focal fluid collection. No soft tissue gas. Arterial vascular calcifications are seen. IMPRESSION: 1. Knee arthroplasty in expected alignment. Comminuted proximal tibial fracture extends to the lateral aspect of the tibial tray as well as the cement adjacent to the tibial stem. Tibial metaphyseal and tibial shaft component demonstrates persistent fracture lucency and no bony bridging, suspicious for delayed/nonunion. 2. Proximal fibular fracture has incomplete surrounding callus formation. 3. Moderate to large thick-walled joint effusion with synovial enhancement. This may be inflammatory or infectious, consider fluid sampling. 4. Generalized soft tissue edema. No focal fluid collection. No soft tissue gas. Electronically Signed   By: Andrea Gasman M.D.   On: 01/24/2024 16:39   DG Chest Port 1 View Result Date: 01/24/2024 CLINICAL DATA:  Provided history:  Questionable sepsis - evaluate for abnormality EXAM: PORTABLE CHEST 1 VIEW COMPARISON:  Radiographs.  CT 12/20/2023 FINDINGS: Increasing patchy opacity at the left lung base. Background bronchial thickening. The heart is stable in size. No pulmonary edema. No significant pleural effusion. No pneumothorax. Surgical clips in the upper abdomen. IMPRESSION: 1. Increasing patchy opacity at the left lung base, atelectasis versus pneumonia. 2. Background bronchial thickening. Electronically Signed   By: Andrea Gasman M.D.   On: 01/24/2024 14:53   DG Knee Complete 4 Views Right Result Date: 01/24/2024 EXAM: 4 OR MORE VIEW(S) XRAY OF THE RIGHT KNEE 01/24/2024 01:19:12 PM COMPARISON: None available. CLINICAL HISTORY: fall FINDINGS: BONES AND JOINTS: Total right knee arthroplasty in place. Chronic fracture deformities involving the proximal shaft of the fibula and periprosthetic proximal tibia. There has been slight medial displacement of the distal tibial fracture fragments compared with a new sharply demarcated fracture line noted along the medial cortex of the proximal right tibia. Effusion within the suprapatellar bursa. SOFT TISSUES: Vascular calcifications. IMPRESSION: 1. Findings suspicious for an acute on chronic periprosthetic proximal tibial fracture, with slight medial displacement of the distal tibial fracture fragments and a new sharply demarcated fracture line along the medial cortex of the proximal right tibia, with chronic fracture deformity of the proximal fibular shaft. 2. Effusion within the suprapatellar bursa. 3. Total right knee arthroplasty in place. Electronically signed by: Waddell Calk MD 01/24/2024 01:39 PM EST RP Workstation: [id number]   DG Tibia/Fibula Right Result Date: 01/24/2024 CLINICAL DATA:  Right leg pain after fall. EXAM: RIGHT TIBIA AND FIBULA - 2 VIEW COMPARISON:  Tibia/fibular radiographs 10/30/2023 FINDINGS: Knee arthroplasty in place. Redemonstrated comminuted mildly  displaced tibial fracture involving the proximal tibia extending to the tibial component of knee arthroplasty is well as the tibial shaft. There also ghost tracks within the tibial shaft. Some periosteal new bone is noted about the proximal medial aspect of the fracture, however no solid bony bridging. Majority of the fracture line remains readily visible. Displaced proximal fibular shaft fracture is unchanged in alignment. No new fracture. Ankle alignment is maintained. Mild generalized soft tissue edema. IMPRESSION: 1. Unchanged alignment of comminuted displaced tibial fracture compared to September exam, proximal extent involving the tibial component of knee arthroplasty. Some periosteal  new bone is noted about the proximal medial aspect of the fracture, however no solid bony bridging. 2. Unchanged alignment of displaced proximal fibular shaft fracture. 3. No new fracture of the lower leg. Electronically Signed   By: Andrea Gasman M.D.   On: 01/24/2024 13:35    EKG: My personal interpretation of EKG shows: Sinus tachycardia, no ST elevation    Assessment/Plan Active Problems:   Hardware complicating wound infection   Type 2 diabetes mellitus with peripheral neuropathy (HCC)   Chronic obstructive pulmonary disease (COPD) (HCC)   HTN (hypertension)   History of DVT (deep vein thrombosis)    Assessment and Plan: 78 year old male with multiple medical problems including but not limited to proximal tibia fracture in 2025 September, s/p right knee hardware removal, type 2 diabetes, COPD not on home oxygen, HTN, history of DVT on Eliquis  at home who presented to [facility] ED for right knee septic arthritis.  He has been waiting to be transferred to [facility]  ED provider requested hospitalist consult for comanagement.  1.  Right knee septic arthritis - Patient is getting vancomycin  and cefepime  which will be continued. - Pharmacy to dose vancomycin  and cefepime  - Follow-up cultures - Pain  management with Percocet 1 tablet every 6 hours - Definitive management per orthopedic sports medical group  2.  Type 2 diabetes on metformin  at home - He will be placed on insulin  sliding scale while he is in the hospital - Hold metformin  due to septic arthritis  3.  HTN - Blood pressure appears to be fairly controlled - I have not seen any medication patient is taking at home on his home medication list. - I will place him on amlodipine  5 mg and hydralazine  10 mg as needed for systolic blood pressure more than 160 - Continue to monitor blood pressure  4.  COPD not in exacerbation - Continue nebulization DuoNebs as needed  5.  BPH - Continue Flomax   6.  GERD - Continue Protonix   7.  History of DVT on Eliquis  - Hold Eliquis  due to patient being planned to go to the OR. - Will restart immediately after orthopedic procedure is done     DVT prophylaxis: SCDs Code Status: Full Code Family Communication: None  Disposition Plan: Transfer to [facility] for orthopedic evaluation.  Hospitalist team please call orthopedic sports team immediately after patient gets to [facility] campus.   Nena Rebel, MD [facility] 01/25/2024, 6:12 PM       [1]  Allergies Allergen Reactions   Losartan Other (See Comments)    Hyperkalemia (>5.5 multiple times on losartan 25mg )   Chicken Allergy Nausea And Vomiting   Poultry Meal Nausea And Vomiting   "

## 2024-01-25 NOTE — ED Provider Notes (Signed)
 ----------------------------------------- 11:08 PM on 01/25/2024 -----------------------------------------  Blood pressure (!) 154/74, pulse 98, temperature 98.1 F (36.7 C), temperature source Oral, resp. rate (!) 28, height 1.727 m (5' 8), weight 59 kg, SpO2 99%.   Assuming care from Dr. Nicholaus.  In short, Joe Coleman is a 78 y.o. male with a chief complaint of sepsis and hyperkalemia.  Refer to the original H&P for additional details.  The current plan of care is to transfer to [facility] for definitive orthopedic care.   Clinical Course as of 01/26/24 9295  Sat Jan 24, 2024  1500 Spoke with Dr. Cleotilde from orthopedic surgery recommending transfer to [facility], awaiting call back from them at this time. [SK]  1505 Periprosthetic fracture: surgery done in July total knee placement with Dr. Ellan. Irrigation done in September. Fell this morning. Concerned possible drainage, possible septic arthritis vs cellulitis. [facility] paged.   [HD]  1524 I spoke with Dr. Jennye from orthopedic surgery at [facility], he reviewed the chart and compared the imaging.  Given the findings, felt no acute fracture as the imaging looked unchanged from prior.  I discussed the findings of the drainage, he feels this is likely consistent with a chronic infection and if he does not appear systemically ill does not warrant transfer.  We are following up blood work here at this time, care of patient transferred to Dr. Nicholaus at this time. [SK]  1544 WBC(!): 17.5 Elevated white blood cell count [HD]  1612 Potassium is 6.4, unclear whether this is hemolyzed and not we will get an EKG and a repeat potassium.  He is already receiving fluids [HD]  1646 EKG and rhythm strip are interpreted by myself:   EKG: [tachycardic sinus  rhythm] at heart rate of 101, normal QRS duration, QTc 391, nonspecific ST segments and T waves no ectopy EKG not consistent with Acute STEMI Rhythm strip: tachycardic sinus rhythm in  lead II  Patient does not have peaked T waves in V2 they are slightly more peaked in V3 and V4 however these seem consistent with his prior EKG in November, repeat potassium has been sent but is pending  [HD]  1712 Potassium is truly elevated.  Will start calcium  gluconate insulin , leukemia and glucose [HD]  1714 Will call back over to [facility] for transfer [HD]  1830 Patient reassessed.  Interventions ongoing.  I have not had a callback yet from [facility].  Secretary is aware [HD]  [id number] Patient reassessed continues to complain of right knee pain.  Morphine  ordered [HD]  1908 No callback yet from [facility] orthopedics [HD]  1928 Case discussed with orthopedic consultant at [facility] [place] on-call for Dr. Cristy, agrees for transfer over but will need hospitalist to admit and they will consult and follow along.  Going back through the transfer center [HD]  1937 Case discussed with transfer center and they are paging out the hospitalist [HD]  2008 Basic metabolic panel(!!) Elevated potassium again despite interventions, we will give him some Lasix , another round of insulin  and glucose and another round of calcium  gluconate albuterol  [HD]  2116 Patient accepted by hospitalist at [facility] Dr. Marcene [HD]  7668 Potassium: 4.9 [HD]  Sun Jan 25, 2024  1537 Received signout: repeating BMP. Abx per pharmacy, awaiting transfer   [HD]  1622 Potassium: 4.7 Not elevated [HD]  1639 Pulled that out there is no room at [facility] tonight patient will likely stay through the morning.  Will consult hospitalist  to help manage care while awaiting transfer [HD]  1709 Case discussed with hospitalist for co-management.  [HD]  Mon Jan 26, 2024  0011 Case signed out to oncoming physician at 11:05 PM [HD]  0703 I reassessed the patient.  He has been sleeping comfortably.  He is alert and oriented and understands the plan for transfer to [facility] when a bed is available.  He has no complaints now except that he is  cold and I brought him a new warm blanket.  Hospitalist consult occurred yesterday and orders were placed for his ongoing medical care while he awaits transfer. [CF]    Clinical Course User Index [CF] Gordan Huxley, MD [HD] Nicholaus Rolland BRAVO, MD [SK] Fernand Rossie HERO, MD     Medications  lactated ringers  infusion ( Intravenous Not Given 01/25/24 1906)  ceFEPIme  (MAXIPIME ) 2 g in sodium chloride  0.9 % 100 mL IVPB (0 g Intravenous Stopped 01/26/24 0055)  vancomycin  (VANCOREADY) IVPB 750 mg/150 mL (0 mg Intravenous Stopped 01/25/24 1755)  insulin  aspart (novoLOG ) injection 0-9 Units (has no administration in time range)  insulin  aspart (novoLOG ) injection 0-5 Units ( Subcutaneous Not Given 01/25/24 2225)  gabapentin  (NEURONTIN ) capsule 300 mg (300 mg Oral Given 01/25/24 2235)  oxyCODONE -acetaminophen  (PERCOCET/ROXICET) 5-325 MG per tablet 1 tablet (1 tablet Oral Given 01/26/24 0023)  pantoprazole  (PROTONIX ) EC tablet 40 mg (has no administration in time range)  polyethylene glycol (MIRALAX  / GLYCOLAX ) packet 17 g (has no administration in time range)  tamsulosin  (FLOMAX ) capsule 0.4 mg (has no administration in time range)  ipratropium-albuterol  (DUONEB) 0.5-2.5 (3) MG/3ML nebulizer solution 3 mL (has no administration in time range)  amLODipine  (NORVASC ) tablet 5 mg (5 mg Oral Given 01/25/24 1820)  hydrALAZINE  (APRESOLINE ) injection 10 mg (has no administration in time range)  morphine  (PF) 4 MG/ML injection 4 mg (4 mg Intravenous Given 01/24/24 1540)  ceFEPIme  (MAXIPIME ) 2 g in sodium chloride  0.9 % 100 mL IVPB (0 g Intravenous Stopped 01/24/24 1611)  metroNIDAZOLE  (FLAGYL ) IVPB 500 mg (0 mg Intravenous Stopped 01/24/24 1641)  vancomycin  (VANCOCIN ) IVPB 1000 mg/200 mL premix (0 mg Intravenous Stopped 01/24/24 1813)  iohexol  (OMNIPAQUE ) 300 MG/ML solution 80 mL (80 mLs Intravenous Contrast Given 01/24/24 1609)  calcium  gluconate 1 g/ 50 mL sodium chloride  IVPB (0 mg Intravenous Stopped  01/24/24 1935)  sodium zirconium cyclosilicate  (LOKELMA ) packet 10 g (10 g Oral Given 01/24/24 1924)  insulin  aspart (novoLOG ) injection 10 Units (10 Units Intravenous Given 01/24/24 1819)    And  dextrose  50 % solution 50 mL (50 mLs Intravenous Given 01/24/24 1820)  morphine  (PF) 4 MG/ML injection 4 mg (4 mg Intravenous Given 01/24/24 1925)  furosemide  (LASIX ) injection 40 mg (40 mg Intravenous Given 01/24/24 2021)  insulin  aspart (novoLOG ) injection 10 Units (10 Units Intravenous Given 01/24/24 2024)    And  dextrose  50 % solution 50 mL (50 mLs Intravenous Given 01/24/24 2024)  albuterol  (PROVENTIL ) (2.5 MG/3ML) 0.083% nebulizer solution 10 mg (10 mg Nebulization Given 01/24/24 2027)  calcium  gluconate 1 g/ 50 mL sodium chloride  IVPB (0 mg Intravenous Stopped 01/24/24 2121)  sodium chloride  0.9 % bolus 1,000 mL (0 mLs Intravenous Stopped 01/25/24 0114)  ceFEPIme  (MAXIPIME ) 1 g in sodium chloride  0.9 % 100 mL IVPB (0 g Intravenous Stopped 01/25/24 0242)  vancomycin  (VANCOREADY) IVPB 750 mg/150 mL (0 mg Intravenous Stopped 01/25/24 0346)  acetaminophen  (TYLENOL ) tablet 975 mg (975 mg Oral Given 01/25/24 1201)     ED Discharge Orders     None  Final diagnoses:  Periprosthetic fracture of proximal end of tibia, initial encounter     Gordan Huxley, MD 01/26/24 [phone]

## 2024-01-25 NOTE — ED Notes (Signed)
 Called [facility] spoke with Natia in reference to bed status.  She states pt is still on the list awaiting bed assignment

## 2024-01-26 ENCOUNTER — Telehealth (HOSPITAL_COMMUNITY): Payer: Self-pay

## 2024-01-26 ENCOUNTER — Inpatient Hospital Stay (HOSPITAL_COMMUNITY)
Admission: AD | Admit: 2024-01-26 | Discharge: 2024-02-03 | DRG: 463 | Disposition: A | Discharge status: Skilled Nursing Facility | Source: Other Acute Inpatient Hospital | Attending: Internal Medicine | Admitting: Internal Medicine

## 2024-01-26 ENCOUNTER — Other Ambulatory Visit (HOSPITAL_COMMUNITY): Payer: Self-pay

## 2024-01-26 ENCOUNTER — Other Ambulatory Visit: Payer: Self-pay

## 2024-01-26 ENCOUNTER — Encounter (HOSPITAL_COMMUNITY): Payer: Self-pay | Admitting: Internal Medicine

## 2024-01-26 DIAGNOSIS — N1831 Chronic kidney disease, stage 3a: Secondary | ICD-10-CM | POA: Diagnosis present

## 2024-01-26 DIAGNOSIS — Z96651 Presence of right artificial knee joint: Secondary | ICD-10-CM | POA: Diagnosis present

## 2024-01-26 DIAGNOSIS — S82409D Unspecified fracture of shaft of unspecified fibula, subsequent encounter for closed fracture with routine healing: Secondary | ICD-10-CM

## 2024-01-26 DIAGNOSIS — E1122 Type 2 diabetes mellitus with diabetic chronic kidney disease: Secondary | ICD-10-CM | POA: Diagnosis present

## 2024-01-26 DIAGNOSIS — Z888 Allergy status to other drugs, medicaments and biological substances status: Secondary | ICD-10-CM

## 2024-01-26 DIAGNOSIS — Z743 Need for continuous supervision: Secondary | ICD-10-CM | POA: Diagnosis not present

## 2024-01-26 DIAGNOSIS — S82101K Unspecified fracture of upper end of right tibia, subsequent encounter for closed fracture with nonunion: Secondary | ICD-10-CM | POA: Diagnosis not present

## 2024-01-26 DIAGNOSIS — Z87891 Personal history of nicotine dependence: Secondary | ICD-10-CM

## 2024-01-26 DIAGNOSIS — N183 Chronic kidney disease, stage 3 unspecified: Secondary | ICD-10-CM | POA: Diagnosis not present

## 2024-01-26 DIAGNOSIS — N400 Enlarged prostate without lower urinary tract symptoms: Secondary | ICD-10-CM | POA: Diagnosis present

## 2024-01-26 DIAGNOSIS — E43 Unspecified severe protein-calorie malnutrition: Secondary | ICD-10-CM | POA: Diagnosis present

## 2024-01-26 DIAGNOSIS — B961 Klebsiella pneumoniae [K. pneumoniae] as the cause of diseases classified elsewhere: Secondary | ICD-10-CM | POA: Diagnosis present

## 2024-01-26 DIAGNOSIS — Z7951 Long term (current) use of inhaled steroids: Secondary | ICD-10-CM

## 2024-01-26 DIAGNOSIS — Z79899 Other long term (current) drug therapy: Secondary | ICD-10-CM | POA: Diagnosis not present

## 2024-01-26 DIAGNOSIS — T8140XA Infection following a procedure, unspecified, initial encounter: Secondary | ICD-10-CM | POA: Diagnosis present

## 2024-01-26 DIAGNOSIS — D649 Anemia, unspecified: Secondary | ICD-10-CM | POA: Diagnosis present

## 2024-01-26 DIAGNOSIS — N179 Acute kidney failure, unspecified: Secondary | ICD-10-CM | POA: Diagnosis present

## 2024-01-26 DIAGNOSIS — I129 Hypertensive chronic kidney disease with stage 1 through stage 4 chronic kidney disease, or unspecified chronic kidney disease: Secondary | ICD-10-CM | POA: Diagnosis present

## 2024-01-26 DIAGNOSIS — Z7984 Long term (current) use of oral hypoglycemic drugs: Secondary | ICD-10-CM | POA: Diagnosis not present

## 2024-01-26 DIAGNOSIS — Z720 Tobacco use: Secondary | ICD-10-CM | POA: Diagnosis not present

## 2024-01-26 DIAGNOSIS — K219 Gastro-esophageal reflux disease without esophagitis: Secondary | ICD-10-CM | POA: Diagnosis present

## 2024-01-26 DIAGNOSIS — T847XXD Infection and inflammatory reaction due to other internal orthopedic prosthetic devices, implants and grafts, subsequent encounter: Secondary | ICD-10-CM | POA: Diagnosis not present

## 2024-01-26 DIAGNOSIS — Z86718 Personal history of other venous thrombosis and embolism: Secondary | ICD-10-CM

## 2024-01-26 DIAGNOSIS — Z681 Body mass index (BMI) 19 or less, adult: Secondary | ICD-10-CM | POA: Diagnosis not present

## 2024-01-26 DIAGNOSIS — Z8739 Personal history of other diseases of the musculoskeletal system and connective tissue: Secondary | ICD-10-CM

## 2024-01-26 DIAGNOSIS — Z7901 Long term (current) use of anticoagulants: Secondary | ICD-10-CM

## 2024-01-26 DIAGNOSIS — Z794 Long term (current) use of insulin: Secondary | ICD-10-CM | POA: Diagnosis not present

## 2024-01-26 DIAGNOSIS — B9561 Methicillin susceptible Staphylococcus aureus infection as the cause of diseases classified elsewhere: Secondary | ICD-10-CM | POA: Diagnosis present

## 2024-01-26 DIAGNOSIS — S81001S Unspecified open wound, right knee, sequela: Secondary | ICD-10-CM

## 2024-01-26 DIAGNOSIS — R54 Age-related physical debility: Secondary | ICD-10-CM | POA: Diagnosis present

## 2024-01-26 DIAGNOSIS — N1832 Chronic kidney disease, stage 3b: Secondary | ICD-10-CM | POA: Diagnosis not present

## 2024-01-26 DIAGNOSIS — I959 Hypotension, unspecified: Secondary | ICD-10-CM | POA: Diagnosis not present

## 2024-01-26 DIAGNOSIS — M009 Pyogenic arthritis, unspecified: Secondary | ICD-10-CM | POA: Diagnosis not present

## 2024-01-26 DIAGNOSIS — S82101A Unspecified fracture of upper end of right tibia, initial encounter for closed fracture: Secondary | ICD-10-CM | POA: Diagnosis present

## 2024-01-26 DIAGNOSIS — M00061 Staphylococcal arthritis, right knee: Principal | ICD-10-CM | POA: Diagnosis present

## 2024-01-26 DIAGNOSIS — J449 Chronic obstructive pulmonary disease, unspecified: Secondary | ICD-10-CM | POA: Diagnosis present

## 2024-01-26 DIAGNOSIS — Z91018 Allergy to other foods: Secondary | ICD-10-CM

## 2024-01-26 DIAGNOSIS — S8991XA Unspecified injury of right lower leg, initial encounter: Secondary | ICD-10-CM | POA: Diagnosis not present

## 2024-01-26 DIAGNOSIS — Z8249 Family history of ischemic heart disease and other diseases of the circulatory system: Secondary | ICD-10-CM | POA: Diagnosis not present

## 2024-01-26 DIAGNOSIS — M79604 Pain in right leg: Secondary | ICD-10-CM | POA: Diagnosis not present

## 2024-01-26 LAB — CBC
HCT: 29.5 % — ABNORMAL LOW (ref 39.0–52.0)
Hemoglobin: 9 g/dL — ABNORMAL LOW (ref 13.0–17.0)
MCH: 27.6 pg (ref 26.0–34.0)
MCHC: 30.5 g/dL (ref 30.0–36.0)
MCV: 90.5 fL (ref 80.0–100.0)
Platelets: 206 K/uL (ref 150–400)
RBC: 3.26 MIL/uL — ABNORMAL LOW (ref 4.22–5.81)
RDW: 14.8 % (ref 11.5–15.5)
WBC: 16.3 K/uL — ABNORMAL HIGH (ref 4.0–10.5)
nRBC: 0 % (ref 0.0–0.2)

## 2024-01-26 LAB — BASIC METABOLIC PANEL WITH GFR
Anion gap: 9 (ref 5–15)
BUN: 21 mg/dL (ref 8–23)
CO2: 21 mmol/L — ABNORMAL LOW (ref 22–32)
Calcium: 7.7 mg/dL — ABNORMAL LOW (ref 8.9–10.3)
Chloride: 106 mmol/L (ref 98–111)
Creatinine, Ser: 1.37 mg/dL — ABNORMAL HIGH (ref 0.61–1.24)
GFR, Estimated: 52 mL/min — ABNORMAL LOW
Glucose, Bld: 149 mg/dL — ABNORMAL HIGH (ref 70–99)
Potassium: 4.2 mmol/L (ref 3.5–5.1)
Sodium: 135 mmol/L (ref 135–145)

## 2024-01-26 LAB — HEMOGLOBIN A1C
Hgb A1c MFr Bld: 6 % — ABNORMAL HIGH (ref 4.8–5.6)
Mean Plasma Glucose: 125.5 mg/dL

## 2024-01-26 LAB — CBG MONITORING, ED
Glucose-Capillary: 137 mg/dL — ABNORMAL HIGH (ref 70–99)
Glucose-Capillary: 166 mg/dL — ABNORMAL HIGH (ref 70–99)

## 2024-01-26 LAB — GLUCOSE, CAPILLARY
Glucose-Capillary: 111 mg/dL — ABNORMAL HIGH (ref 70–99)
Glucose-Capillary: 132 mg/dL — ABNORMAL HIGH (ref 70–99)

## 2024-01-26 MED ORDER — INSULIN ASPART 100 UNIT/ML IJ SOLN
0.0000 [IU] | Freq: Every day | INTRAMUSCULAR | Status: DC
  Administered 2024-01-29: 3 [IU] via SUBCUTANEOUS
  Administered 2024-01-30: 5 [IU] via SUBCUTANEOUS
  Administered 2024-01-31: 3 [IU] via SUBCUTANEOUS
  Filled 2024-01-26: qty 4
  Filled 2024-01-26 (×2): qty 3

## 2024-01-26 MED ORDER — ALBUTEROL SULFATE (2.5 MG/3ML) 0.083% IN NEBU: 2.5000 mg | INHALATION_SOLUTION | Freq: Four times a day (QID) | RESPIRATORY_TRACT | Status: DC | PRN

## 2024-01-26 MED ORDER — POLYETHYLENE GLYCOL 3350 17 G PO PACK
17.0000 g | PACK | Freq: Every day | ORAL | Status: DC
  Administered 2024-01-28 – 2024-01-31 (×2): 17 g via ORAL
  Filled 2024-01-26 (×7): qty 1

## 2024-01-26 MED ORDER — APIXABAN 5 MG PO TABS
5.0000 mg | ORAL_TABLET | Freq: Two times a day (BID) | ORAL | Status: DC
  Administered 2024-01-26: 5 mg via ORAL
  Filled 2024-01-26: qty 1

## 2024-01-26 MED ORDER — SODIUM CHLORIDE 0.9 % IV SOLN
2.0000 g | Freq: Every day | INTRAVENOUS | Status: DC
  Administered 2024-01-26: 2 g via INTRAVENOUS
  Filled 2024-01-26: qty 20

## 2024-01-26 MED ORDER — SODIUM CHLORIDE 0.9 % IV SOLN: INTRAVENOUS | Status: DC

## 2024-01-26 MED ORDER — SODIUM CHLORIDE 0.9% FLUSH
3.0000 mL | Freq: Two times a day (BID) | INTRAVENOUS | Status: DC
  Administered 2024-01-26 – 2024-02-02 (×14): 3 mL via INTRAVENOUS

## 2024-01-26 MED ORDER — ONDANSETRON HCL 4 MG PO TABS: 4.0000 mg | ORAL_TABLET | Freq: Four times a day (QID) | ORAL | Status: DC | PRN

## 2024-01-26 MED ORDER — BUDESON-GLYCOPYRROL-FORMOTEROL 160-9-4.8 MCG/ACT IN AERO
2.0000 | INHALATION_SPRAY | Freq: Two times a day (BID) | RESPIRATORY_TRACT | Status: DC
  Administered 2024-01-27 – 2024-02-03 (×15): 2 via RESPIRATORY_TRACT
  Filled 2024-01-26: qty 5.9

## 2024-01-26 MED ORDER — OXYCODONE HCL 5 MG PO TABS
5.0000 mg | ORAL_TABLET | ORAL | Status: DC | PRN
  Administered 2024-01-26: 5 mg via ORAL
  Filled 2024-01-26: qty 1

## 2024-01-26 MED ORDER — VITAMIN D 25 MCG (1000 UNIT) PO TABS
1000.0000 [IU] | ORAL_TABLET | Freq: Every day | ORAL | Status: DC
  Administered 2024-01-27 – 2024-02-03 (×8): 1000 [IU] via ORAL
  Filled 2024-01-26 (×7): qty 1

## 2024-01-26 MED ORDER — GABAPENTIN 300 MG PO CAPS
300.0000 mg | ORAL_CAPSULE | Freq: Every day | ORAL | Status: DC
  Administered 2024-01-26 – 2024-02-02 (×8): 300 mg via ORAL
  Filled 2024-01-26 (×7): qty 1

## 2024-01-26 MED ORDER — MORPHINE SULFATE (PF) 2 MG/ML IV SOLN
1.0000 mg | INTRAVENOUS | Status: DC | PRN
  Administered 2024-01-26: 2 mg via INTRAVENOUS
  Filled 2024-01-26: qty 1

## 2024-01-26 MED ORDER — GLUCERNA SHAKE PO LIQD
237.0000 mL | Freq: Three times a day (TID) | ORAL | Status: DC
  Administered 2024-01-26 – 2024-01-31 (×9): 237 mL via ORAL

## 2024-01-26 MED ORDER — VANCOMYCIN HCL 750 MG/150ML IV SOLN
750.0000 mg | INTRAVENOUS | Status: DC
  Administered 2024-01-27: 750 mg via INTRAVENOUS
  Filled 2024-01-26: qty 150

## 2024-01-26 MED ORDER — SODIUM CHLORIDE 0.9 % IV BOLUS
500.0000 mL | Freq: Once | INTRAVENOUS | Status: AC
  Administered 2024-01-26: 500 mL via INTRAVENOUS

## 2024-01-26 MED ORDER — TAMSULOSIN HCL 0.4 MG PO CAPS
0.4000 mg | ORAL_CAPSULE | Freq: Every day | ORAL | Status: DC
  Administered 2024-01-27 – 2024-02-03 (×8): 0.4 mg via ORAL
  Filled 2024-01-26 (×7): qty 1

## 2024-01-26 MED ORDER — ACETAMINOPHEN 325 MG PO TABS
650.0000 mg | ORAL_TABLET | Freq: Four times a day (QID) | ORAL | Status: DC | PRN
  Administered 2024-02-02: 650 mg via ORAL

## 2024-01-26 MED ORDER — PANTOPRAZOLE SODIUM 40 MG PO TBEC
40.0000 mg | DELAYED_RELEASE_TABLET | Freq: Every day | ORAL | Status: DC
  Administered 2024-01-27 – 2024-02-03 (×8): 40 mg via ORAL
  Filled 2024-01-26 (×7): qty 1

## 2024-01-26 MED ORDER — INSULIN ASPART 100 UNIT/ML IJ SOLN
0.0000 [IU] | Freq: Three times a day (TID) | INTRAMUSCULAR | Status: DC
  Administered 2024-01-27: 1 [IU] via SUBCUTANEOUS
  Administered 2024-01-27 – 2024-01-28 (×4): 2 [IU] via SUBCUTANEOUS
  Administered 2024-01-29: 3 [IU] via SUBCUTANEOUS
  Administered 2024-01-29: 1 [IU] via SUBCUTANEOUS
  Administered 2024-01-30: 9 [IU] via SUBCUTANEOUS
  Administered 2024-01-31: 5 [IU] via SUBCUTANEOUS
  Administered 2024-01-31 (×2): 3 [IU] via SUBCUTANEOUS
  Administered 2024-02-01: 2 [IU] via SUBCUTANEOUS
  Administered 2024-02-01 – 2024-02-02 (×2): 1 [IU] via SUBCUTANEOUS
  Administered 2024-02-02: 2 [IU] via SUBCUTANEOUS
  Administered 2024-02-03: 1 [IU] via SUBCUTANEOUS
  Administered 2024-02-03: 3 [IU] via SUBCUTANEOUS
  Filled 2024-01-26: qty 1
  Filled 2024-01-26 (×2): qty 3
  Filled 2024-01-26: qty 2
  Filled 2024-01-26: qty 3
  Filled 2024-01-26: qty 1
  Filled 2024-01-26: qty 5
  Filled 2024-01-26: qty 2
  Filled 2024-01-26: qty 3
  Filled 2024-01-26: qty 2
  Filled 2024-01-26: qty 9
  Filled 2024-01-26: qty 1
  Filled 2024-01-26 (×2): qty 2

## 2024-01-26 MED ORDER — METRONIDAZOLE 500 MG/100ML IV SOLN
500.0000 mg | Freq: Two times a day (BID) | INTRAVENOUS | Status: DC
  Administered 2024-01-26 – 2024-01-27 (×2): 500 mg via INTRAVENOUS
  Filled 2024-01-26 (×2): qty 100

## 2024-01-26 MED ORDER — ONDANSETRON HCL 4 MG/2ML IJ SOLN: 4.0000 mg | Freq: Four times a day (QID) | INTRAMUSCULAR | Status: DC | PRN

## 2024-01-26 MED ORDER — METHOCARBAMOL 500 MG PO TABS
500.0000 mg | ORAL_TABLET | Freq: Four times a day (QID) | ORAL | Status: DC | PRN
  Administered 2024-02-01 – 2024-02-03 (×6): 500 mg via ORAL
  Filled 2024-01-26: qty 1

## 2024-01-26 MED ORDER — ACETAMINOPHEN 650 MG RE SUPP: 650.0000 mg | Freq: Four times a day (QID) | RECTAL | Status: DC | PRN

## 2024-01-26 MED ORDER — ENOXAPARIN SODIUM 60 MG/0.6ML IJ SOSY
60.0000 mg | PREFILLED_SYRINGE | Freq: Two times a day (BID) | INTRAMUSCULAR | Status: AC
  Administered 2024-01-26 – 2024-01-29 (×7): 60 mg via SUBCUTANEOUS
  Filled 2024-01-26 (×7): qty 0.6

## 2024-01-26 NOTE — ED Notes (Signed)
 Pt seems a bit confused. This NT assisted with repositioning the pt and removing his blankets, adjusting the thermostat and closing the door for comfort. No other needs at this time

## 2024-01-26 NOTE — Telephone Encounter (Signed)

## 2024-01-26 NOTE — H&P (Addendum)
 " History and Physical    Patient: Joe Coleman FMW:[id number] DOB: 09-17-1945 DOA: 01/26/2024 DOS: the patient was seen and examined on 01/26/2024 PCP: [facility]  Patient coming from: Home  Chief Complaint: No chief complaint on file.  HPI: Joe Coleman is a 78 y.o. male with medical history significant of diabetes mellitus 2 on metformin , BPH on Flomax , CKD 3A, hypertension, GERD, COPD and history of DVT on Eliquis .  Patient was hospitalized back in September 2025 due to infected hardware in the right knee.  He underwent removal of hardware.  Wound cultures at that time positive for pansensitive Enterobacter and Klebsiella.  He was discharged home on linezolid  and doxycycline  as well as 25 days of IV ertapenem  to treat underlying osteomyelitis.  Readmitted to the hospital in November 2025 due to sepsis secondary to pneumonia.  He was discharged on Augmentin  as well as supportive care.  He states several days before he presented this time he followed up with his orthopedic team and follow-up surgical intervention was recommended.  He states he does have some memory issues and this may not be an accurate assessment.  He reports about 2 days ago he began having significant increase in pain and drainage and increased redness at the prior right lower extremity wound below the knee.  He had run out of his prior Percocet and was having difficulty managing his pain.  He presented to the ED where CT of the knee and lower extremity revealed a moderate to large thick-walled joint effusion as well as a comminuted proximal tibia fracture.  He was evaluated by Dr. Cristy with orthopedic team at [facility] and due to the complexity of his case it was recommended that he be transferred to [facility].  Hospitalist service has initially evaluated this patient in the consultative role at [facility] and has now assumed care of this patient at [facility].   Review of Systems: As  mentioned in the history of present illness. All other systems reviewed and are negative. Past Medical History:  Diagnosis Date   Cancer (HCC)    stomach and colon   Diabetes mellitus without complication (HCC)    Type II   Hypertension    Past Surgical History:  Procedure Laterality Date   APPENDECTOMY     COLON SURGERY     HARDWARE REMOVAL Right 10/15/2023   Procedure: REMOVAL, HARDWARE;  Surgeon: [name] Franky SQUIBB, MD;  Location: [facility] OR;  Service: Orthopedics;  Laterality: Right;  IRRIGATION AND DEBRIDEMENT, HARDWARE REMOVAL   ORIF TIBIA FRACTURE Right 08/25/2023   Procedure: OPEN REDUCTION INTERNAL FIXATION (ORIF) TIBIA FRACTURE, RIGHT;  Surgeon: [name] Franky SQUIBB, MD;  Location: [facility] OR;  Service: Orthopedics;  Laterality: Right;   Social History:  reports that he has quit smoking. He has never used smokeless tobacco. He reports that he does not drink alcohol and does not use drugs.  Allergies[1]  Family History  Problem Relation Age of Onset   Heart disease Mother    Heart disease Brother     Prior to Admission medications  Medication Sig Start Date End Date Taking? Authorizing Provider  acetaminophen  (TYLENOL ) 325 MG tablet Take 2 tablets (650 mg total) by mouth every 6 (six) hours as needed for mild pain (pain score 1-3) or fever (or Fever >/= 101). 10/29/23   Josette Ade, MD  albuterol  (PROVENTIL  HFA;VENTOLIN  HFA) 108 (90 Base) MCG/ACT inhaler Inhale 2 puffs into the lungs every 6 (six) hours as needed  for wheezing or shortness of breath. 04/01/16   Lang Dover, MD  apixaban  (ELIQUIS ) 5 MG TABS tablet Take 1 tablet (5 mg total) by mouth 2 (two) times daily. 12/22/23   Laurita Pillion, MD  bisacodyl  (DULCOLAX) 5 MG EC tablet Take 1 tablet (5 mg total) by mouth daily as needed for moderate constipation. 10/24/23   Tobie Yetta HERO, MD  cholecalciferol  (CHOLECALCIFEROL ) 25 MCG tablet Take 1 tablet (1,000 Units total) by mouth daily. 08/28/23   Danton Lauraine LABOR, PA-C  feeding  supplement, GLUCERNA SHAKE, (GLUCERNA SHAKE) LIQD Take 237 mLs by mouth 3 (three) times daily between meals. 09/01/23   Sherrill Cable Latif, DO  gabapentin  (NEURONTIN ) 300 MG capsule Take 300 mg by mouth at bedtime.    [provider]  guaiFENesin  (MUCINEX ) 600 MG 12 hr tablet Take 1 tablet (600 mg total) by mouth 2 (two) times daily. 10/24/23 10/23/24  Tobie Yetta HERO, MD  metFORMIN  (GLUCOPHAGE ) 500 MG tablet Take 500 mg by mouth 2 (two) times daily with a meal. 12/16/23 08/02/24  [provider]  methocarbamol  (ROBAXIN ) 500 MG tablet Take 1 tablet (500 mg total) by mouth every 6 (six) hours as needed for muscle spasms. 10/24/23   Tobie Yetta HERO, MD  ondansetron  (ZOFRAN ) 4 MG tablet Take 1 tablet (4 mg total) by mouth every 6 (six) hours as needed for nausea. 10/29/23   Josette Ade, MD  oxyCODONE -acetaminophen  (PERCOCET/ROXICET) 5-325 MG tablet Take 1 tablet by mouth every 6 (six) hours as needed. 11/04/23   [provider]  pantoprazole  (PROTONIX ) 20 MG tablet Take 2 tablets (40 mg total) by mouth daily for 14 days. 12/05/23 12/19/23  Rogelia Jerilynn RAMAN, MD  polyethylene glycol (MIRALAX  / GLYCOLAX ) 17 g packet Take 17 g by mouth daily. 09/01/23   Sherrill Cable Latif, DO  sodium chloride  flush (NS) 0.9 % SOLN 10-40 mLs by Intracatheter route every 12 (twelve) hours. Patient not taking: Reported on 12/20/2023 10/29/23   Josette Ade, MD  tamsulosin  (FLOMAX ) 0.4 MG CAPS capsule Take 1 capsule (0.4 mg total) by mouth daily. 12/23/23   Laurita Pillion, MD  TRELEGY RUFINO 200-62.5-25 MCG/ACT AEPB Inhale 1 puff into the lungs daily. 07/17/23   [provider]    Physical Exam: Vitals:   01/26/24 1447  BP: 137/70  Resp: 20  Temp: 99.4 F (37.4 C)  TempSrc: Oral   Constitutional: NAD, calm, uncomfortable Respiratory: clear to auscultation bilaterally, no wheezing, no crackles. Normal respiratory effort. No accessory muscle use. RA Cardiovascular: Regular rate, no  tachycardia, distal pulses palpable. Abdomen: no tenderness, no masses palpated. No hepatosplenomegaly. Bowel sounds positive.  Musculoskeletal: no clubbing / cyanosis. No joint deformity upper and lower extremities. Good ROM, no contractures. Normal muscle tone.   Skin: Wound as above Neurologic: CN 2-12 grossly intact. Sensation intact,  Strength 5/5 x all 4 extremities.  Psychiatric: Normal judgment and insight. Alert and oriented x 3. Normal mood.    Data Reviewed:  Sodium 135, potassium 4.2, chloride 106, CO2 21, glucose 149, BUN 21, creatinine 1.37, calcium  7.7  WBC 16,300, differential not obtained, hemoglobin 9, platelets 206,000  CT right knee with contrast: Prior knee arthroplasty with expected alignment.  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  There is also a proximal fibular fracture that demonstrates incomplete healing.  There is a moderate to  large thick-walled joint effusion with synovial enhancement likely infectious in etiology.  Also generalized soft tissue edema without focal fluid collection or soft tissue gas.  Assessment and Plan: Septic right knee with effusion Previous infection with osteomyelitis requiring removal of prior knee hardware in September 2025 and was treated with prolonged oral and IV antibiotics.  Cultures during that admission were positive for pansensitive Enterobacter and Klebsiella. Patient has redeveloped edema, erythema pain and drainage from an open wound that he reports has never healed.  Imaging consistent for joint effusion and nonhealing fractures Appreciate assistance of orthopedic team- Blood cultures obtained at [facility] and results are pending Received cefepime , Flagyl  and vancomycin  prior to arrival.  Continue vancomycin  and Flagyl  and at recommendation of pharmacy will  discontinue cefepime  in favor of Rocephin  likely will need infectious disease consultation Noted to have palpable distal pulses.  Suspect may have persistent osteomyelitis. Tylenol , oxycodone , IV morphine , Neurontin  and Robaxin  for pain management  Diabetes mellitus 2 Patient states CBGs running in the 120s at home which is higher than baseline During acute illness and after receipt of IV contrast will hold metformin  Follow CBGs and provide SSI Can add insulin  glargine if sugars remain consistently greater than 200 Hemoglobin A1c was 6.0 in November  CKD IIIa Current creatinine at baseline Follow labs  History of DVT on Eliquis  Continue home dose Eliquis   BPH Continue Flomax   Hypertension Not on medications prior to admission  COPD Continue as needed albuterol  neb On Trelegy at home-therapeutic substitution with Breztri   GERD Continue PPI  Protein calorie malnutrition Current BMI 19.49 Albumin  has ranged between 2.8 and 3.5 Nutrition consultation Glucerna supplement    Advance Care Planning:   Code Status: Full Code   VTE prophylaxis: Eliquis   Consults: Orthopedic  Family Communication: Patient on  Severity of Illness: The appropriate patient status for this patient is INPATIENT. Inpatient status is judged to be reasonable and necessary in order to provide the required intensity of service to ensure the patient's safety. The patient's presenting symptoms, physical exam findings, and initial radiographic and laboratory data in the context of their chronic comorbidities is felt to place them at high risk for further clinical deterioration. Furthermore, it is not anticipated that the patient will be medically stable for discharge from the hospital within 2 midnights of admission.   * I certify that at the point of admission it is my clinical judgment that the patient will require inpatient hospital care spanning beyond 2 midnights from the point of admission due to high  intensity of service, high risk for further deterioration and high frequency of surveillance required.*  Author: Isaiah Lever, NP 01/26/2024 3:52 PM  For on call review [email or web address].      [1]  Allergies Allergen Reactions   Losartan Other (See Comments)    Hyperkalemia (>5.5 multiple times on losartan 25mg )   Chicken Allergy Nausea And Vomiting   Poultry Meal Nausea And Vomiting   "

## 2024-01-26 NOTE — Consult Note (Addendum)
 "  consultation note   Joe Coleman FMW:[id number] DOB: 1945/06/04 DOA: 01/24/2024   PCP: [facility]   Consultation requested: Dr. [name], ED physician  Patient coming from: Home  I have personally briefly reviewed patient's old medical records in [facility]  subjective: Reports mild right knee/leg pain today. No fevers, otherwise feeling fine  Review of Systems:  ROS  All other systems negative except as noted in the HPI.  Past Medical History:  Diagnosis Date   Cancer (HCC)    stomach and colon   Diabetes mellitus without complication (HCC)    Type II   Hypertension     Past Surgical History:  Procedure Laterality Date   APPENDECTOMY     COLON SURGERY     HARDWARE REMOVAL Right 10/15/2023   Procedure: REMOVAL, HARDWARE;  Surgeon: [name] Franky SQUIBB, MD;  Location: [facility] OR;  Service: Orthopedics;  Laterality: Right;  IRRIGATION AND DEBRIDEMENT, HARDWARE REMOVAL   ORIF TIBIA FRACTURE Right 08/25/2023   Procedure: OPEN REDUCTION INTERNAL FIXATION (ORIF) TIBIA FRACTURE, RIGHT;  Surgeon: [name] Franky SQUIBB, MD;  Location: [facility] OR;  Service: Orthopedics;  Laterality: Right;     reports that he has quit smoking. He has never used smokeless tobacco. He reports that he does not drink alcohol and does not use drugs.  Allergies[1]  Family History  Problem Relation Age of Onset   Heart disease Mother    Heart disease Brother     Prior to Admission medications  Medication Sig Start Date End Date Taking? Authorizing Provider  acetaminophen  (TYLENOL ) 325 MG tablet Take 2 tablets (650 mg total) by mouth every 6 (six) hours as needed for mild pain (pain score 1-3) or fever (or Fever >/= 101). 10/29/23   Josette Ade, MD  albuterol  (PROVENTIL  HFA;VENTOLIN  HFA) 108 (90 Base) MCG/ACT inhaler Inhale 2 puffs into the lungs every 6 (six) hours as needed for wheezing or shortness of breath. 04/01/16   Lang Dover, MD  apixaban  (ELIQUIS ) 5 MG TABS tablet  Take 1 tablet (5 mg total) by mouth 2 (two) times daily. 12/22/23   Laurita Pillion, MD  bisacodyl  (DULCOLAX) 5 MG EC tablet Take 1 tablet (5 mg total) by mouth daily as needed for moderate constipation. 10/24/23   Tobie Yetta HERO, MD  cholecalciferol  (CHOLECALCIFEROL ) 25 MCG tablet Take 1 tablet (1,000 Units total) by mouth daily. 08/28/23   Danton Lauraine LABOR, PA-C  feeding supplement, GLUCERNA SHAKE, (GLUCERNA SHAKE) LIQD Take 237 mLs by mouth 3 (three) times daily between meals. 09/01/23   Sherrill Cable Latif, DO  gabapentin  (NEURONTIN ) 300 MG capsule Take 300 mg by mouth at bedtime.    [provider]  guaiFENesin  (MUCINEX ) 600 MG 12 hr tablet Take 1 tablet (600 mg total) by mouth 2 (two) times daily. 10/24/23 10/23/24  Tobie Yetta HERO, MD  metFORMIN  (GLUCOPHAGE ) 500 MG tablet Take 500 mg by mouth 2 (two) times daily with a meal. 12/16/23 08/02/24  [provider]  methocarbamol  (ROBAXIN ) 500 MG tablet Take 1 tablet (500 mg total) by mouth every 6 (six) hours as needed for muscle spasms. 10/24/23   Tobie Yetta HERO, MD  ondansetron  (ZOFRAN ) 4 MG tablet Take 1 tablet (4 mg total) by mouth every 6 (six) hours as needed for nausea. 10/29/23   Josette Ade, MD  oxyCODONE -acetaminophen  (PERCOCET/ROXICET) 5-325 MG tablet Take 1 tablet by mouth every 6 (six) hours as needed. 11/04/23   [provider]  pantoprazole  (PROTONIX ) 20 MG tablet  Take 2 tablets (40 mg total) by mouth daily for 14 days. 12/05/23 12/19/23  Rogelia Jerilynn RAMAN, MD  polyethylene glycol (MIRALAX  / GLYCOLAX ) 17 g packet Take 17 g by mouth daily. 09/01/23   Sherrill Cable Latif, DO  sodium chloride  flush (NS) 0.9 % SOLN 10-40 mLs by Intracatheter route every 12 (twelve) hours. Patient not taking: Reported on 12/20/2023 10/29/23   Josette Ade, MD  tamsulosin  (FLOMAX ) 0.4 MG CAPS capsule Take 1 capsule (0.4 mg total) by mouth daily. 12/23/23   Laurita Pillion, MD  TRELEGY RUFINO 200-62.5-25 MCG/ACT AEPB Inhale 1 puff into  the lungs daily. 07/17/23   [provider]    Physical Exam: Vitals:   01/26/24 0530 01/26/24 0730 01/26/24 0830 01/26/24 0900  BP: 129/64 137/71 130/68 (!) 144/79  Pulse: 94 99 (!) 104 (!) 104  Resp: 19 (!) 25 (!) 22 15  Temp: 98.7 F (37.1 C)     TempSrc: Oral     SpO2: 98% 100% 99% 92%  Weight:      Height:        Physical Exam   NAD, pale RRR CTAB Abdomen soft, non-tender Ext: ulcerated area anterior superior tibia with mild amount of purulent drainage and surrounding erythema  Labs on Admission: I have personally reviewed following labs and imaging studies  CBC: Recent Labs  Lab 01/24/24 1510 01/26/24 0500  WBC 17.5* 16.3*  NEUTROABS 14.6*  --   HGB 9.8* 9.0*  HCT 32.4* 29.5*  MCV 91.8 90.5  PLT 341 206   Basic Metabolic Panel: Recent Labs  Lab 01/24/24 1510 01/24/24 1611 01/24/24 1837 01/24/24 2220 01/25/24 1140 01/25/24 1528 01/26/24 0500  NA 135  --  132* 132*  --  134* 135  K 6.4* 6.6* 6.6* 4.9  --  4.7 4.2  CL 104  --  100 101  --  101 106  CO2 23  --  19* 21*  --  23 21*  GLUCOSE 171*  --  229* 80  --  156* 149*  BUN 18  --  18 16  --  20 21  CREATININE 1.31*  --  1.38* 1.32* 1.45* 1.39* 1.37*  CALCIUM  8.9  --  8.4* 8.6*  --  8.4* 7.7*   GFR: Estimated Creatinine Clearance: 35.9 mL/min (A) (by C-G formula based on SCr of 1.37 mg/dL (H)). Liver Function Tests: Recent Labs  Lab 01/24/24 1510  AST 20  ALT 6  ALKPHOS 174*  BILITOT 0.5  PROT 7.1  ALBUMIN  3.8   No results for input(s): LIPASE, AMYLASE in the last 168 hours. No results for input(s): AMMONIA in the last 168 hours. Coagulation Profile: Recent Labs  Lab 01/24/24 1510  INR 1.0   Cardiac Enzymes: No results for input(s): CKTOTAL, CKMB, CKMBINDEX, TROPONINI, TROPONINIHS in the last 168 hours. BNP (last 3 results) No results for input(s): BNP in the last 8760 hours. HbA1C: Recent Labs    01/25/24 1825  HGBA1C 6.0*   CBG: Recent Labs   Lab 01/24/24 2034 01/24/24 2114 01/25/24 2224 01/26/24 0730  GLUCAP 241* 126* 162* 137*   Lipid Profile: No results for input(s): CHOL, HDL, LDLCALC, TRIG, CHOLHDL, LDLDIRECT in the last 72 hours. Thyroid Function Tests: No results for input(s): TSH, T4TOTAL, FREET4, T3FREE, THYROIDAB in the last 72 hours. Anemia Panel: No results for input(s): VITAMINB12, FOLATE, FERRITIN, TIBC, IRON , RETICCTPCT in the last 72 hours. Urine analysis:    Component Value Date/Time   COLORURINE YELLOW 12/05/2023 1045   APPEARANCEUR  CLEAR 12/05/2023 1045   LABSPEC 1.012 12/05/2023 1045   PHURINE 6.0 12/05/2023 1045   GLUCOSEU NEGATIVE 12/05/2023 1045   HGBUR NEGATIVE 12/05/2023 1045   BILIRUBINUR NEGATIVE 12/05/2023 1045   KETONESUR NEGATIVE 12/05/2023 1045   PROTEINUR NEGATIVE 12/05/2023 1045   NITRITE NEGATIVE 12/05/2023 1045   LEUKOCYTESUR NEGATIVE 12/05/2023 1045    Radiological Exams on Admission: I have personally reviewed images CT KNEE RIGHT W CONTRAST Result Date: 01/24/2024 CLINICAL DATA:  Provided history: looks like an infected right knee s/p recent washout. now with purulent drainainge EXAM: CT OF THE RIGHT KNEE WITH CONTRAST TECHNIQUE: Multidetector CT imaging was performed following the standard protocol during bolus administration of intravenous contrast. RADIATION DOSE REDUCTION: This exam was performed according to the departmental dose-optimization program which includes automated exposure control, adjustment of the mA and/or kV according to patient size and/or use of iterative reconstruction technique. CONTRAST:  80mL OMNIPAQUE  IOHEXOL  300 MG/ML  SOLN COMPARISON:  Radiograph earlier today, additional prior radiographs reviewed. FINDINGS: Bones/Joint/Cartilage Knee arthroplasty in expected alignment. Prior patellar resurfacing. Comminuted proximal tibial fracture extends to the lateral aspect of the tibial tray as well as the cement adjacent to the  tibial stem. Comminuted metaphyseal and diaphyseal component which is not entirely included in the field of view. There is no bony bridging. Proximal fibular fracture has incomplete surrounding callus formation. Moderate to large thick-walled joint effusion with synovial enhancement. Ligaments Suboptimally assessed by CT. Muscles and Tendons No intramuscular collection. Slightly indistinct fatty striations in the anterior compartment adjacent to tibial fracture. Soft tissues Generalized soft tissue edema. No focal fluid collection. No soft tissue gas. Arterial vascular calcifications are seen. IMPRESSION: 1. Knee arthroplasty in expected alignment. Comminuted proximal tibial fracture extends to the lateral aspect of the tibial tray as well as the cement adjacent to the tibial stem. Tibial metaphyseal and tibial shaft component demonstrates persistent fracture lucency and no bony bridging, suspicious for delayed/nonunion. 2. Proximal fibular fracture has incomplete surrounding callus formation. 3. Moderate to large thick-walled joint effusion with synovial enhancement. This may be inflammatory or infectious, consider fluid sampling. 4. Generalized soft tissue edema. No focal fluid collection. No soft tissue gas. Electronically Signed   By: Andrea Gasman M.D.   On: 01/24/2024 16:39   DG Chest Port 1 View Result Date: 01/24/2024 CLINICAL DATA:  Provided history: Questionable sepsis - evaluate for abnormality EXAM: PORTABLE CHEST 1 VIEW COMPARISON:  Radiographs.  CT 12/20/2023 FINDINGS: Increasing patchy opacity at the left lung base. Background bronchial thickening. The heart is stable in size. No pulmonary edema. No significant pleural effusion. No pneumothorax. Surgical clips in the upper abdomen. IMPRESSION: 1. Increasing patchy opacity at the left lung base, atelectasis versus pneumonia. 2. Background bronchial thickening. Electronically Signed   By: Andrea Gasman M.D.   On: 01/24/2024 14:53   DG Knee  Complete 4 Views Right Result Date: 01/24/2024 EXAM: 4 OR MORE VIEW(S) XRAY OF THE RIGHT KNEE 01/24/2024 01:19:12 PM COMPARISON: None available. CLINICAL HISTORY: fall FINDINGS: BONES AND JOINTS: Total right knee arthroplasty in place. Chronic fracture deformities involving the proximal shaft of the fibula and periprosthetic proximal tibia. There has been slight medial displacement of the distal tibial fracture fragments compared with a new sharply demarcated fracture line noted along the medial cortex of the proximal right tibia. Effusion within the suprapatellar bursa. SOFT TISSUES: Vascular calcifications. IMPRESSION: 1. Findings suspicious for an acute on chronic periprosthetic proximal tibial fracture, with slight medial displacement of the distal tibial fracture fragments and  a new sharply demarcated fracture line along the medial cortex of the proximal right tibia, with chronic fracture deformity of the proximal fibular shaft. 2. Effusion within the suprapatellar bursa. 3. Total right knee arthroplasty in place. Electronically signed by: Waddell Calk MD 01/24/2024 01:39 PM EST RP Workstation: [id number]   DG Tibia/Fibula Right Result Date: 01/24/2024 CLINICAL DATA:  Right leg pain after fall. EXAM: RIGHT TIBIA AND FIBULA - 2 VIEW COMPARISON:  Tibia/fibular radiographs 10/30/2023 FINDINGS: Knee arthroplasty in place. Redemonstrated comminuted mildly displaced tibial fracture involving the proximal tibia extending to the tibial component of knee arthroplasty is well as the tibial shaft. There also ghost tracks within the tibial shaft. Some periosteal new bone is noted about the proximal medial aspect of the fracture, however no solid bony bridging. Majority of the fracture line remains readily visible. Displaced proximal fibular shaft fracture is unchanged in alignment. No new fracture. Ankle alignment is maintained. Mild generalized soft tissue edema. IMPRESSION: 1. Unchanged alignment of comminuted  displaced tibial fracture compared to September exam, proximal extent involving the tibial component of knee arthroplasty. Some periosteal new bone is noted about the proximal medial aspect of the fracture, however no solid bony bridging. 2. Unchanged alignment of displaced proximal fibular shaft fracture. 3. No new fracture of the lower leg. Electronically Signed   By: Andrea Gasman M.D.   On: 01/24/2024 13:35    EKG: My personal interpretation of EKG shows: Sinus tachycardia, no ST elevation    Assessment/Plan Active Problems:   Hardware complicating wound infection   Type 2 diabetes mellitus with peripheral neuropathy (HCC)   Chronic obstructive pulmonary disease (COPD) (HCC)   HTN (hypertension)   CKD stage 3b, GFR 30-44 ml/min (HCC)   Malignant neoplasm of rectosigmoid junction (HCC)   Malignant neoplasm of stomach (HCC)   History of DVT (deep vein thrombosis)    Assessment and Plan: 78 year old male with multiple medical problems including but not limited to proximal tibia fracture in 2025 September, s/p right knee hardware removal, type 2 diabetes, COPD not on home oxygen, HTN, history of DVT on Eliquis  at home who presented to [facility] ED for right knee septic arthritis.  He has been waiting to be transferred to [facility].  ED provider requested hospitalist consult for comanagement.  1.  Right knee septic arthritis Has erythema and purulent drainage, has hardware, ct shows joint space effusion - Patient is getting vancomycin  and cefepime  which will be continued. - Follow-up blood cultures - Pain management with Percocet 1 tablet every 6 hours - Definitive management per orthopedic sports medical group, patient is awaiting a bed at [facility]  2.  Type 2 diabetes on metformin  at home Well controlled A1c 6 - He will be placed on insulin  sliding scale while he is in the hospital - Hold metformin     3.  HTN Bp elevated here, doesn't appear to be on home meds - amlodipine   started, will monitor  4.  COPD not in exacerbation - Continue nebulization DuoNebs as needed  5.  BPH - Continue Flomax   6.  GERD - Continue Protonix   7.  History of DVT on Eliquis  - Hold Eliquis  due to anticipated surgery     DVT prophylaxis: SCDs Code Status: Full Code Family Communication: None  Disposition Plan: Transfer to [facility] for orthopedic evaluation.  Hospitalist team please call orthopedic sports team immediately after patient gets to [facility] campus. EDP spoke with Dr. Cristy at [facility],  MD [facility] 01/26/2024, 9:59 AM        [1]  Allergies Allergen Reactions   Losartan Other (See Comments)    Hyperkalemia (>5.5 multiple times on losartan 25mg )   Chicken Allergy Nausea And Vomiting   Poultry Meal Nausea And Vomiting   "

## 2024-01-26 NOTE — ED Notes (Signed)
 Spoke with Joe Coleman at [facility]  , waiting on bed .

## 2024-01-26 NOTE — Progress Notes (Addendum)
 Pharmacy Antibiotic Note  Joe Coleman is a 77 y.o. male admitted on 01/26/2024 with R knee septic arthritis with hardware.  Pharmacy has been consulted for vancomycin  and cefepime  dosing. Team agreed with narrowing cefepime  to ceftriaxone .  12/22 Vancomycin  750mg  Q 24 hr with Est AUC: 447 Scr used: 1.37 mg/dL; Vd coeff: 0.72 L/kg Last dose vanc 750mg  12/21 at 1700 (also received 750mg  at 02:45)  Plan: Ceftriaxone  2g q24hr Vanc 750mg  q24hr Monitor cultures, clinical status, renal function, vancomycin  level Narrow abx as able and f/u duration    Height: 5' 8.5 (174 cm) IBW/kg (Calculated) : 69.55  Temp (24hrs), Avg:98.5 F (36.9 C), Min:98.1 F (36.7 C), Max:99.4 F (37.4 C)  Recent Labs  Lab 01/24/24 1508 01/24/24 1510 01/24/24 1510 01/24/24 1837 01/24/24 2220 01/25/24 1140 01/25/24 1528 01/26/24 0500  WBC  --  17.5*  --   --   --   --   --  16.3*  CREATININE  --  1.31*   < > 1.38* 1.32* 1.45* 1.39* 1.37*  LATICACIDVEN 1.2  --   --  1.6  --   --   --   --    < > = values in this interval not displayed.    Estimated Creatinine Clearance: 35.9 mL/min (A) (by C-G formula based on SCr of 1.37 mg/dL (H)).    Allergies[1]  Antimicrobials this admission: Cefe 12/20 >> 12/21 vanc 12/20 >>  MTZ 12/20  CTX 12/22>>   Dose adjustments this admission: 12/22 Vanc 750mg  q12 > q24hr  Microbiology results: None     Thank you for allowing pharmacy to be a part of this patients care.  Jinnie Door, PharmD, BCPS, BCCP Clinical Pharmacist  Please check AMION for all [facility] Pharm[facility] phone numbers After 10:00 PM, call Main Pharmacy [phone]     [1]  Allergies Allergen Reactions   Losartan Other (See Comments)    Hyperkalemia (>5.5 multiple times on losartan 25mg )   Chicken Allergy Nausea And Vomiting   Poultry Meal Nausea And Vomiting

## 2024-01-26 NOTE — Plan of Care (Signed)

## 2024-01-26 NOTE — Progress Notes (Signed)
 PHARMACY - ANTICOAGULATION CONSULT NOTE  Pharmacy Consult for enoxaparin  Indication: DVT  Allergies[1]  Patient Measurements: Height: 5' 8.5 (174 cm) IBW/kg (Calculated) : 69.55  Vital Signs: Temp: 99.4 F (37.4 C) (12/22 1447) Temp Source: Oral (12/22 1447) BP: 137/70 (12/22 1447) Pulse Rate: 100 (12/22 1336)  Labs: Recent Labs    01/24/24 1510 01/24/24 1837 01/25/24 1140 01/25/24 1528 01/26/24 0500  HGB 9.8*  --   --   --  9.0*  HCT 32.4*  --   --   --  29.5*  PLT 341  --   --   --  206  LABPROT 13.6  --   --   --   --   INR 1.0  --   --   --   --   CREATININE 1.31*   < > 1.45* 1.39* 1.37*   < > = values in this interval not displayed.    Estimated Creatinine Clearance: 35.9 mL/min (A) (by C-G formula based on SCr of 1.37 mg/dL (H)).   Medical History: Past Medical History:  Diagnosis Date   Cancer (HCC)    stomach and colon   Diabetes mellitus without complication (HCC)    Type II   Hypertension       Assessment: 78 yo M with hx LLE DVT on apixaban  pta now held, last dose 12/21 9pm. Now admitted with septic knee and likely procedure this admit. Pharmacy consulted for enoxaparin .  Goal of Therapy:  Monitor platelets by anticoagulation protocol: Yes   Plan:   Enoxaparin  60 mg q12hr Monitor renal function for dose adjustment, s/sx bleeding   Jinnie Door, PharmD, BCPS, BCCP Clinical Pharmacist  Please check AMION for all [facility] Pharmacy phone numbers After 10:00 PM, call Main Pharmacy [phone]     [1]  Allergies Allergen Reactions   Losartan Other (See Comments)    Hyperkalemia (>5.5 multiple times on losartan 25mg )   Chicken Allergy Nausea And Vomiting   Poultry Meal Nausea And Vomiting

## 2024-01-27 DIAGNOSIS — M009 Pyogenic arthritis, unspecified: Secondary | ICD-10-CM | POA: Diagnosis not present

## 2024-01-27 DIAGNOSIS — E1122 Type 2 diabetes mellitus with diabetic chronic kidney disease: Secondary | ICD-10-CM | POA: Diagnosis not present

## 2024-01-27 DIAGNOSIS — Z720 Tobacco use: Secondary | ICD-10-CM

## 2024-01-27 DIAGNOSIS — N183 Chronic kidney disease, stage 3 unspecified: Secondary | ICD-10-CM | POA: Diagnosis not present

## 2024-01-27 DIAGNOSIS — Z96651 Presence of right artificial knee joint: Secondary | ICD-10-CM

## 2024-01-27 LAB — SYNOVIAL CELL COUNT + DIFF, W/ CRYSTALS
Crystals, Fluid: NONE SEEN
Eosinophils-Synovial: 0 % (ref 0–1)
Lymphocytes-Synovial Fld: 4 % (ref 0–20)
Monocyte-Macrophage-Synovial Fluid: 3 % — ABNORMAL LOW (ref 50–90)
Neutrophil, Synovial: 93 % — ABNORMAL HIGH (ref 0–25)
WBC, Synovial: 69690 /mm3 — ABNORMAL HIGH (ref 0–200)

## 2024-01-27 LAB — CBC
HCT: 27.7 % — ABNORMAL LOW (ref 39.0–52.0)
Hemoglobin: 8.4 g/dL — ABNORMAL LOW (ref 13.0–17.0)
MCH: 27.5 pg (ref 26.0–34.0)
MCHC: 30.3 g/dL (ref 30.0–36.0)
MCV: 90.8 fL (ref 80.0–100.0)
Platelets: 227 K/uL (ref 150–400)
RBC: 3.05 MIL/uL — ABNORMAL LOW (ref 4.22–5.81)
RDW: 14.8 % (ref 11.5–15.5)
WBC: 12.4 K/uL — ABNORMAL HIGH (ref 4.0–10.5)
nRBC: 0 % (ref 0.0–0.2)

## 2024-01-27 LAB — BASIC METABOLIC PANEL WITH GFR
Anion gap: 13 (ref 5–15)
BUN: 24 mg/dL — ABNORMAL HIGH (ref 8–23)
CO2: 19 mmol/L — ABNORMAL LOW (ref 22–32)
Calcium: 8.4 mg/dL — ABNORMAL LOW (ref 8.9–10.3)
Chloride: 102 mmol/L (ref 98–111)
Creatinine, Ser: 1.54 mg/dL — ABNORMAL HIGH (ref 0.61–1.24)
GFR, Estimated: 45 mL/min — ABNORMAL LOW
Glucose, Bld: 128 mg/dL — ABNORMAL HIGH (ref 70–99)
Potassium: 4.4 mmol/L (ref 3.5–5.1)
Sodium: 133 mmol/L — ABNORMAL LOW (ref 135–145)

## 2024-01-27 LAB — PHOSPHORUS: Phosphorus: 3 mg/dL (ref 2.5–4.6)

## 2024-01-27 LAB — GLUCOSE, CAPILLARY
Glucose-Capillary: 135 mg/dL — ABNORMAL HIGH (ref 70–99)
Glucose-Capillary: 164 mg/dL — ABNORMAL HIGH (ref 70–99)
Glucose-Capillary: 176 mg/dL — ABNORMAL HIGH (ref 70–99)
Glucose-Capillary: 115 mg/dL — ABNORMAL HIGH (ref 70–99)

## 2024-01-27 LAB — PROCALCITONIN: Procalcitonin: 1.38 ng/mL

## 2024-01-27 LAB — MRSA NEXT GEN BY PCR, NASAL: MRSA by PCR Next Gen: DETECTED — AB

## 2024-01-27 LAB — C-REACTIVE PROTEIN: CRP: 30.3 mg/dL — ABNORMAL HIGH

## 2024-01-27 LAB — MAGNESIUM: Magnesium: 1.9 mg/dL (ref 1.7–2.4)

## 2024-01-27 LAB — CK: Total CK: 28 U/L — ABNORMAL LOW (ref 49–397)

## 2024-01-27 MED ORDER — SODIUM CHLORIDE 0.9 % IV SOLN
1.0000 g | INTRAVENOUS | Status: DC
  Administered 2024-01-27 – 2024-01-29 (×3): 1 g via INTRAVENOUS
  Filled 2024-01-27 (×6): qty 1000

## 2024-01-27 MED ORDER — THIAMINE MONONITRATE 100 MG PO TABS
100.0000 mg | ORAL_TABLET | Freq: Every day | ORAL | Status: AC
  Administered 2024-01-28 – 2024-02-03 (×7): 100 mg
  Filled 2024-01-27 (×5): qty 1

## 2024-01-27 MED ORDER — MORPHINE SULFATE (PF) 2 MG/ML IV SOLN
2.0000 mg | INTRAVENOUS | Status: DC | PRN
  Administered 2024-01-29 – 2024-02-01 (×6): 2 mg via INTRAVENOUS
  Filled 2024-01-27 (×6): qty 1

## 2024-01-27 MED ORDER — OXYCODONE HCL 5 MG PO TABS
5.0000 mg | ORAL_TABLET | Freq: Four times a day (QID) | ORAL | Status: DC | PRN
  Administered 2024-01-27 – 2024-02-01 (×8): 5 mg via ORAL
  Filled 2024-01-27 (×8): qty 1

## 2024-01-27 MED ORDER — HALOPERIDOL LACTATE 5 MG/ML IJ SOLN
2.0000 mg | Freq: Once | INTRAMUSCULAR | Status: AC
  Administered 2024-01-27: 2 mg via INTRAVENOUS
  Filled 2024-01-27: qty 1

## 2024-01-27 MED ORDER — SODIUM CHLORIDE 0.9 % IV SOLN
2.0000 g | Freq: Two times a day (BID) | INTRAVENOUS | Status: DC
  Administered 2024-01-27: 2 g via INTRAVENOUS
  Filled 2024-01-27: qty 12.5

## 2024-01-27 MED ORDER — CARVEDILOL 3.125 MG PO TABS
3.1250 mg | ORAL_TABLET | Freq: Two times a day (BID) | ORAL | Status: DC
  Administered 2024-01-27 – 2024-02-03 (×16): 3.125 mg via ORAL
  Filled 2024-01-27 (×12): qty 1

## 2024-01-27 MED ORDER — DAPTOMYCIN-SODIUM CHLORIDE 500-0.9 MG/50ML-% IV SOLN
8.0000 mg/kg | Freq: Every day | INTRAVENOUS | Status: DC
  Administered 2024-01-27 – 2024-01-29 (×3): 500 mg via INTRAVENOUS
  Filled 2024-01-27 (×6): qty 50

## 2024-01-27 MED ORDER — ADULT MULTIVITAMIN W/MINERALS CH
1.0000 | ORAL_TABLET | Freq: Every day | ORAL | Status: DC
  Administered 2024-01-28 – 2024-02-03 (×7): 1 via ORAL
  Filled 2024-01-27 (×5): qty 1

## 2024-01-27 MED ORDER — LIDOCAINE HCL 1 % IJ SOLN
10.0000 mL | Freq: Once | INTRAMUSCULAR | Status: AC
  Administered 2024-01-27: 10 mL via INTRADERMAL
  Filled 2024-01-27 (×2): qty 10

## 2024-01-27 MED ORDER — PROSOURCE PLUS PO LIQD
30.0000 mL | Freq: Two times a day (BID) | ORAL | Status: DC
  Administered 2024-01-27 – 2024-01-31 (×8): 30 mL via ORAL
  Filled 2024-01-27 (×11): qty 30

## 2024-01-27 MED ORDER — SODIUM CHLORIDE 0.9 % IV SOLN: INTRAVENOUS | Status: AC

## 2024-01-27 NOTE — Procedures (Signed)
 PRE-PROCEDURE DIAGNOSIS:  Right knee effusion(s) POST-OPERATIVE DIAGNOSIS:  Same PROCEDURE:  Aspiration right knee  Provider: Lauraine Moores, PA-C   Anesthesia: 3 mL Lidocaine  1%   EBL: None  PROCEDURE DETAILS:  After informed verbal consent was obtained the superolateral portal was prepped with alcohol. Approximately 3 mL of lidocaine  were injected superficially and then into the deeper tissues and into the joint space. Flash identified appropriate positioning and pressurized joint space. Medicine was left to sit for several minutes to take effect. Next, a 60 mL syringe with an 18-gauge needle was used to aspirate approximately 40ml of yellow cloudy-like fluid.  He tolerated this well without complication and a Band-Aid was placed.   01/27/2024 8:53 AM  Complications: None    Lauraine PATRIC Moores PA-C [facility] [phone] (office) [email or web address]

## 2024-01-27 NOTE — Progress Notes (Signed)
 Will change ceftriaxone  to cefepime  d/t previous tibial cx with enterobacter cloacae and kleb per Dr Dennise.  Sergio Batch, PharmD, BCIDP, AAHIVP, CPP Infectious Disease Pharmacist 01/27/2024 8:32 AM

## 2024-01-27 NOTE — Consult Note (Addendum)
 "        [facility]    Date of Admission:  01/26/2024   Total days of inpatient antibiotics 2        Reason for Consult: Right knee PJI    Principal Problem:   Septic arthritis of knee, right [facility])   Assessment: 78 year old male with past medical history of tobacco abuse, diabetes, CKD stage III, BPH on Flomax , COPD, DVT on Eliquis , right TKA, right leg injury requiring ORIF in July 2025 complicated by wound dehiscence found to have right tibial hardwareinfection status post I&D and removal on 9/10 with cultures growing Enterobacter cloacae and Klebsiella pneumoniae dischargexd cefepime  and daptomycin  complicated under mental status transition to ertapenem  and linezolid  to complete 6 weeks course EOT 10/21 followed by ID outpatient, transition to Doxy and Cipro  on 10/21 continued on 11/5 for another 2 weeks presents with significant pain and drainage from knee.  Admitted for further management #Right knee PJI #History of ORIF status post tibial hardware removal completed with infection treated with Dapto cefepime  Patient afebrile on admission, WBC 17K.  Blood cultures obtained - CT showed right knee arthroplasty in expected alignment comminuted proximal tibial fracture, fibular fracture, moderate to large thick-walled joint effusion and synovial enhancement -Orthopedics consulted WBC 17K.  Blood cultures obtained and patient underwent joint aspiration.  If no clinical improvement over the next few days plan on OR for debridement #Tobacco abuse - Smoking cessation for optimal wound healing #Diabetes mellitus - Needs glycemic control for optimal wound healing Recommendations: - Follow aspirate cultures - Discontinue vancomycin  and cefepime  -Daptomycin  and ertapenem  - Follow knee aspirate - Will need suppression of hardware is left in place - Plan communicated with primary - Standard precautions Microbiology:   Antibiotics: Vancomycin  and cefepime   12/20-present  Cultures: Blood 12/20 Urine  Other   HPI: Joe Coleman is a 78 y.o. male with past medical history of diabetes type 2 on metformin , BPH on Flomax , CKD stage III, hypertension, GERD, COPD, history of DVT on Eliquis , tobacco abuse, leg injury requiring ORIF in July 2025history of previous total knee replacement) this was complicated by wound dehiscence found to have right tibial hardware  infection status post I&D and removal on 9/10 with cultures growing Enterobacter cloacae and Klebsiella pneumoniae dischargexd cefepime  and daptomycin  complicated under mental status transition to ertapenem  and linezolid  to complete 6 weeks course EOT 10/21 followed by ID outpatient, transition to Doxy and Cipro  on 10/21 continued on 11/5 for another 2 weeks presents with significant pain and drainage from knee.  Examined by orthopedics at [facility] due to complexity of case transferred to [facility].  CT has shown moderate to large thick-walled joint effusion and proximal tibia fracture.  ID engaged for antibiotic recommendations. Review of Systems: Review of Systems  All other systems reviewed and are negative.   Past Medical History:  Diagnosis Date   Cancer (HCC)    stomach and colon   Diabetes mellitus without complication (HCC)    Type II   Hypertension     Social History[1]  Family History  Problem Relation Age of Onset   Heart disease Mother    Heart disease Brother    Scheduled Meds:  (feeding supplement) PROSource Plus  30 mL Oral BID BM   budesonide -glycopyrrolate -formoterol   2 puff Inhalation BID   carvedilol   3.125 mg Oral BID WC   vitamin D3  1,000 Units Oral Daily   enoxaparin  (LOVENOX ) injection  60 mg Subcutaneous Q12H  feeding supplement (GLUCERNA SHAKE)  237 mL Oral TID BM   gabapentin   300 mg Oral QHS   insulin  aspart  0-5 Units Subcutaneous QHS   insulin  aspart  0-9 Units Subcutaneous TID WC   [START ON 01/28/2024] multivitamin with minerals  1 tablet  Oral Daily   pantoprazole   40 mg Oral Daily   polyethylene glycol  17 g Oral Daily   sodium chloride  flush  3 mL Intravenous Q12H   tamsulosin   0.4 mg Oral Daily   [START ON 01/28/2024] thiamine   100 mg Per Tube Daily   Continuous Infusions:  sodium chloride  50 mL/hr at 01/27/24 0608   DAPTOmycin      ertapenem      PRN Meds:.acetaminophen  **OR** [DISCONTINUED] acetaminophen , methocarbamol , morphine  injection, ondansetron  **OR** ondansetron  (ZOFRAN ) IV, oxyCODONE  Allergies[2]  OBJECTIVE: Blood pressure 106/61, pulse 100, temperature 98.5 F (36.9 C), temperature source Oral, resp. rate 20, height 5' 8.5 (1.74 m), SpO2 92%.  Physical Exam Constitutional:      General: He is not in acute distress.    Appearance: He is normal weight. He is not toxic-appearing.  HENT:     Head: Normocephalic and atraumatic.     Right Ear: External ear normal.     Left Ear: External ear normal.     Nose: No congestion or rhinorrhea.     Mouth/Throat:     Mouth: Mucous membranes are moist.     Pharynx: Oropharynx is clear.  Eyes:     Extraocular Movements: Extraocular movements intact.     Conjunctiva/sclera: Conjunctivae normal.     Pupils: Pupils are equal, round, and reactive to light.  Cardiovascular:     Rate and Rhythm: Normal rate and regular rhythm.     Heart sounds: No murmur heard.    No friction rub. No gallop.  Pulmonary:     Effort: Pulmonary effort is normal.     Breath sounds: Normal breath sounds.  Abdominal:     General: Abdomen is flat. Bowel sounds are normal.     Palpations: Abdomen is soft.  Musculoskeletal:     Cervical back: Normal range of motion and neck supple.     Comments: R knee wound  Skin:    General: Skin is warm and dry.  Neurological:     General: No focal deficit present.     Mental Status: He is oriented to person, place, and time.  Psychiatric:        Mood and Affect: Mood normal.     Lab Results Lab Results  Component Value Date   WBC 12.4  (H) 01/27/2024   HGB 8.4 (L) 01/27/2024   HCT 27.7 (L) 01/27/2024   MCV 90.8 01/27/2024   PLT 227 01/27/2024    Lab Results  Component Value Date   CREATININE 1.54 (H) 01/27/2024   BUN 24 (H) 01/27/2024   NA 133 (L) 01/27/2024   K 4.4 01/27/2024   CL 102 01/27/2024   CO2 19 (L) 01/27/2024    Lab Results  Component Value Date   ALT 6 01/24/2024   AST 20 01/24/2024   ALKPHOS 174 (H) 01/24/2024   BILITOT 0.5 01/24/2024       Loney Stank, MD [facility] 01/27/2024, 1:30 PM Evaluation of this patient requires complex antimicrobial therapy evaluation and counseling + isolation needs for disease transmission risk assessment and mitigation      [1]  Social History Tobacco Use   Smoking status: Former  Smokeless tobacco: Never  Vaping Use   Vaping status: Never Used  Substance Use Topics   Alcohol use: No   Drug use: No  [2]  Allergies Allergen Reactions   Losartan Other (See Comments)    Hyperkalemia (>5.5 multiple times on losartan 25mg )   Chicken Allergy Nausea And Vomiting   Poultry Meal Nausea And Vomiting   "

## 2024-01-27 NOTE — Progress Notes (Signed)
 "                                                                                                                                                                                                                                                                                PROGRESS NOTE     Patient Demographics:    Joe Coleman, is a 78 y.o. male, DOB - February 05, 1946, FMW:[id number]  Outpatient Primary MD for the patient is [facility]    LOS - 1  Admit date - 01/26/2024    No chief complaint on file.      Brief Narrative (HPI from H&P)    78 y.o. male with medical history significant of diabetes mellitus 2 on metformin , BPH on Flomax , CKD 3A, hypertension, GERD, COPD and history of DVT on Eliquis .  Patient was hospitalized back in September 2025 due to infected hardware in the right knee.  He underwent removal of hardware.  Wound cultures at that time positive for pansensitive Enterobacter and Klebsiella.  He was discharged home on linezolid  and doxycycline  as well as 25 days of IV ertapenem  to treat underlying osteomyelitis.  Readmitted to the hospital in November 2025 due to sepsis secondary to pneumonia.  He was discharged on Augmentin  as well as supportive care.    He states several days before he presented this time he followed up with his orthopedic team and follow-up surgical intervention was recommended.  He states he does have some memory issues and this may not be an accurate assessment.  He reports about 2 days ago he began having significant increase in pain and drainage and increased redness at the prior right lower extremity wound below the knee.  He had run out of his prior Percocet and was having difficulty managing his pain.  He presented to the ED where CT of the knee and lower extremity revealed a moderate to large thick-walled joint effusion as well as a comminuted proximal tibia fracture.  He was evaluated by Dr. Cristy with orthopedic team at [facility] and due to the complexity of his case it was recommended that he be transferred to [facility]    Subjective:  Joe Coleman today has, No headache, No chest pain, No abdominal pain - No Nausea, No new weakness tingling or numbness, no shortness of breath, does have some right knee discomfort and pain.   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  Cultures during that admission were positive for pansensitive Enterobacter and Klebsiella.  Infection is clearly worsened, orthopedics [name] consulted case discussed with him today he will be seeing him shortly and probably taking him to the OR tomorrow, patient may require AKA, discussed this with patient's brother.  For now continue empiric IV antibiotics, requested ID to evaluate as well.  Follow cultures.  Pain control and gentle IV fluids.  Patient agreeable for blood transfusion if needed.   AKI on CKD IIIa baseline creatinine around 1.3, gentle hydration IV fluids, monitor bladder scans and BMP.   History of DVT on Eliquis  currently on Lovenox .   BPH Continue Flomax    Hypertension placed on low-dose Coreg .   COPD  Continue as needed albuterol  neb, On Trelegy at home-therapeutic substitution with Breztri , stable.   GERD  Continue PPI   Moderate protein calorie malnutrition Current BMI 19.49, add protein supplement  Diabetes mellitus 2 - SSI.  Lab Results  Component Value Date   HGBA1C 6.0 (H) 01/25/2024   CBG (last 3)  Recent Labs    01/26/24 1120 01/26/24 1718 01/26/24 2107  GLUCAP 166* 111* 132*       Condition - Extremely Guarded  Family Communication  : Discussed with patient's cousin Joe Coleman over the phone on 01/27/2024  Code Status : Full code  Consults  : Orthopedics, ID  PUD Prophylaxis : PPI   Procedures  :      CT 01/24/24 -  R Knee -   1. Knee arthroplasty in expected alignment. Comminuted proximal tibial fracture extends to the lateral aspect of the tibial tray as well as the cement adjacent to the tibial stem. Tibial metaphyseal and tibial shaft component demonstrates persistent fracture lucency and no bony bridging, suspicious for delayed/nonunion. 2. Proximal fibular fracture has incomplete surrounding callus formation. 3. Moderate to large thick-walled joint effusion with synovial enhancement. This may be inflammatory or infectious, consider fluid sampling. 4. Generalized soft tissue edema. No focal fluid collection. No soft tissue gas.      Disposition Plan  :    Status is: Inpatient   DVT Prophylaxis  : Lovenox     Lab Results  Component Value Date   PLT 227 01/27/2024    Diet :  Diet Order             Diet NPO time specified Except for: Sips with Meds  Diet effective now                    Inpatient Medications  Scheduled Meds:  budesonide -glycopyrrolate -formoterol   2 puff Inhalation BID   carvedilol   3.125 mg Oral BID WC   vitamin D3  1,000 Units Oral Daily   enoxaparin  (LOVENOX ) injection  60 mg Subcutaneous Q12H   feeding supplement (GLUCERNA SHAKE)  237 mL Oral TID BM   gabapentin   300 mg Oral QHS   insulin  aspart  0-5 Units Subcutaneous QHS   insulin  aspart  0-9 Units Subcutaneous TID WC   pantoprazole   40 mg Oral Daily   polyethylene glycol  17 g Oral Daily  sodium chloride  flush  3 mL Intravenous Q12H   tamsulosin   0.4 mg Oral Daily   Continuous Infusions:  sodium chloride  50 mL/hr at 01/27/24 9391   cefTRIAXone  (ROCEPHIN )  IV 2 g (01/26/24 2122)   metronidazole  500 mg (01/27/24 9388)   vancomycin  750 mg (01/27/24 0503)   PRN Meds:.acetaminophen  **OR** [DISCONTINUED] acetaminophen , methocarbamol , morphine  injection, ondansetron  **OR** ondansetron  (ZOFRAN ) IV, oxyCODONE   Antibiotics  :    Anti-infectives (From admission, onward)    Start     Dose/Rate Route  Frequency Ordered Stop   01/27/24 0500  vancomycin  (VANCOREADY) IVPB 750 mg/150 mL        750 mg 150 mL/hr over 60 Minutes Intravenous Every 24 hours 01/26/24 1616     01/26/24 2200  cefTRIAXone  (ROCEPHIN ) 2 g in sodium chloride  0.9 % 100 mL IVPB        2 g 200 mL/hr over 30 Minutes Intravenous Daily at bedtime 01/26/24 1616     01/26/24 1700  metroNIDAZOLE  (FLAGYL ) IVPB 500 mg        500 mg 100 mL/hr over 60 Minutes Intravenous Every 12 hours 01/26/24 1605           Objective:   Vitals:   01/26/24 1553 01/26/24 1928 01/26/24 2344 01/27/24 0437  BP:  128/65 (!) 140/59 126/68  Pulse:  (!) 112 (!) 111 (!) 110  Resp:   18 18  Temp:  99.1 F (37.3 C) 98.4 F (36.9 C) 98.6 F (37 C)  TempSrc:  Oral Oral Oral  SpO2:  (!) 2%    Height: 5' 8.5 (1.74 m)       Wt Readings from Last 3 Encounters:  01/24/24 59 kg  12/20/23 59 kg  10/29/23 48.9 kg    No intake or output data in the 24 hours ending 01/27/24 0808   Physical Exam  Awake Alert, No new F.N deficits, Normal affect Experiment.AT,PERRAL Supple Neck, No JVD,   Symmetrical Chest wall movement, Good air movement bilaterally, CTAB RRR,No Gallops,Rubs or new Murmurs,  +ve B.Sounds, Abd Soft, No tenderness,    R Knee on 01/26/2024       Data Review:    Recent Labs  Lab 01/24/24 1510 01/26/24 0500 01/27/24 0320  WBC 17.5* 16.3* 12.4*  HGB 9.8* 9.0* 8.4*  HCT 32.4* 29.5* 27.7*  PLT 341 206 227  MCV 91.8 90.5 90.8  MCH 27.8 27.6 27.5  MCHC 30.2 30.5 30.3  RDW 15.5 14.8 14.8  LYMPHSABS 1.0  --   --   MONOABS 1.7*  --   --   EOSABS 0.1  --   --   BASOSABS 0.1  --   --     Recent Labs  Lab  0000 01/24/24 1508 01/24/24 1510 01/24/24 1611 01/24/24 1837 01/24/24 2220 01/25/24 1140 01/25/24 1528 01/25/24 1825 01/26/24 0500 01/27/24 0320 01/27/24 0542  NA   < >  --  135  --  132* 132*  --  134*  --  135 133*  --   K  --   --  6.4*   < > 6.6* 4.9  --  4.7  --  4.2 4.4  --   CL   < >  --  104  --  100 101   --  101  --  106 102  --   CO2   < >  --  23  --  19* 21*  --  23  --  21* 19*  --  ANIONGAP   < >  --  8  --  13 10  --  10  --  9 13  --   GLUCOSE   < >  --  171*  --  229* 80  --  156*  --  149* 128*  --   BUN   < >  --  18  --  18 16  --  20  --  21 24*  --   CREATININE   < >  --  1.31*  --  1.38* 1.32* 1.45* 1.39*  --  1.37* 1.54*  --   AST  --   --  20  --   --   --   --   --   --   --   --   --   ALT  --   --  6  --   --   --   --   --   --   --   --   --   ALKPHOS  --   --  174*  --   --   --   --   --   --   --   --   --   BILITOT  --   --  0.5  --   --   --   --   --   --   --   --   --   ALBUMIN   --   --  3.8  --   --   --   --   --   --   --   --   --   CRP  --   --   --   --  9.1*  --   --   --   --   --   --  30.3*  PROCALCITON  --   --   --   --  0.52  --   --   --   --   --   --  1.38  LATICACIDVEN  --  1.2  --   --  1.6  --   --   --   --   --   --   --   INR  --   --  1.0  --   --   --   --   --   --   --   --   --   HGBA1C  --   --   --   --   --   --   --   --  6.0*  --   --   --   CALCIUM    < >  --  8.9  --  8.4* 8.6*  --  8.4*  --  7.7* 8.4*  --    < > = values in this interval not displayed.      Recent Labs  Lab 01/24/24 1508 01/24/24 1510 01/24/24 1510 01/24/24 1837 01/24/24 2220 01/25/24 1528 01/25/24 1825 01/26/24 0500 01/27/24 0320 01/27/24 0542  CRP  --   --   --  9.1*  --   --   --   --   --  30.3*  PROCALCITON  --   --   --  0.52  --   --   --   --   --  1.38  LATICACIDVEN 1.2  --   --  1.6  --   --   --   --   --   --  INR  --  1.0  --   --   --   --   --   --   --   --   HGBA1C  --   --   --   --   --   --  6.0*  --   --   --   CALCIUM   --  8.9   < > 8.4* 8.6* 8.4*  --  7.7* 8.4*  --    < > = values in this interval not displayed.    --------------------------------------------------------------------------------------------------------------- No results found for: CHOL, HDL, LDLCALC, LDLDIRECT, TRIG, CHOLHDL  Lab Results   Component Value Date   HGBA1C 6.0 (H) 01/25/2024   No results for input(s): TSH, T4TOTAL, FREET4, T3FREE, THYROIDAB in the last 72 hours. No results for input(s): VITAMINB12, FOLATE, FERRITIN, TIBC, IRON , RETICCTPCT in the last 72 hours. ------------------------------------------------------------------------------------------------------------------ Cardiac Enzymes No results for input(s): CKMB, TROPONINI, MYOGLOBIN in the last 168 hours.  Invalid input(s): CK  Micro Results Recent Results (from the past 240 hours)  Resp panel by RT-PCR (RSV, Flu A&B, Covid) Anterior Nasal Swab     Status: None   Collection Time: 01/24/24  3:08 PM   Specimen: Anterior Nasal Swab  Result Value Ref Range Status   SARS Coronavirus 2 by RT PCR NEGATIVE NEGATIVE Final    Comment: (NOTE) SARS-CoV-2 target nucleic acids are NOT DETECTED.  The SARS-CoV-2 RNA is generally detectable in upper respiratory specimens during the acute phase of infection. The lowest concentration of SARS-CoV-2 viral copies this assay can detect is 138 copies/mL. A negative result does not preclude SARS-Cov-2 infection and should not be used as the sole basis for treatment or other patient management decisions. A negative result may occur with  improper specimen collection/handling, submission of specimen other than nasopharyngeal swab, presence of viral mutation(s) within the areas targeted by this assay, and inadequate number of viral copies(<138 copies/mL). A negative result must be combined with clinical observations, patient history, and epidemiological information. The expected result is Negative.  Fact Sheet for Patients:  bloggercourse.com  Fact Sheet for Healthcare Providers:  seriousbroker.it  This test is no t yet approved or cleared by the [place]  FDA and  has been authorized for detection and/or diagnosis of SARS-CoV-2  by FDA under an Emergency Use Authorization (EUA). This EUA will remain  in effect (meaning this test can be used) for the duration of the COVID-19 declaration under Section 564(b)(1) of the Act, 21 U.S.C.section 360bbb-3(b)(1), unless the authorization is terminated  or revoked sooner.       Influenza A by PCR NEGATIVE NEGATIVE Final   Influenza B by PCR NEGATIVE NEGATIVE Final    Comment: (NOTE) The Xpert Xpress SARS-CoV-2/FLU/RSV plus assay is intended as an aid in the diagnosis of influenza from Nasopharyngeal swab specimens and should not be used as a sole basis for treatment. Nasal washings and aspirates are unacceptable for Xpert Xpress SARS-CoV-2/FLU/RSV testing.  Fact Sheet for Patients: bloggercourse.com  Fact Sheet for Healthcare Providers: seriousbroker.it  This test is not yet approved or cleared by the [place]  FDA and has been authorized for detection and/or diagnosis of SARS-CoV-2 by FDA under an Emergency Use Authorization (EUA). This EUA will remain in effect (meaning this test can be used) for the duration of the COVID-19 declaration under Section 564(b)(1) of the Act, 21 U.S.C. section 360bbb-3(b)(1), unless the authorization is terminated or revoked.     Resp Syncytial Virus by PCR NEGATIVE NEGATIVE Final  Comment: (NOTE) Fact Sheet for Patients: bloggercourse.com  Fact Sheet for Healthcare Providers: seriousbroker.it  This test is not yet approved or cleared by the [place]  FDA and has been authorized for detection and/or diagnosis of SARS-CoV-2 by FDA under an Emergency Use Authorization (EUA). This EUA will remain in effect (meaning this test can be used) for the duration of the COVID-19 declaration under Section 564(b)(1) of the Act, 21 U.S.C. section 360bbb-3(b)(1), unless the authorization is terminated or revoked.  Performed at  [facility], [address] [place], [place] [address]   Blood Culture (routine x 2)     Status: None (Preliminary result)   Collection Time: 01/24/24  3:20 PM   Specimen: BLOOD  Result Value Ref Range Status   Specimen Description BLOOD BLOOD LEFT HAND  Final   Special Requests   Final    Blood Culture results may not be optimal due to an inadequate volume of blood received in culture bottles   Culture   Final    NO GROWTH 3 DAYS Performed at [facility], [address], [place], [place] [address]    Report Status PENDING  Incomplete  Blood Culture (routine x 2)     Status: None (Preliminary result)   Collection Time: 01/24/24  3:20 PM   Specimen: BLOOD  Result Value Ref Range Status   Specimen Description BLOOD BLOOD RIGHT ARM  Final   Special Requests   Final    Blood Culture results may not be optimal due to an inadequate volume of blood received in culture bottles   Culture   Final    NO GROWTH 3 DAYS Performed at [facility], [address], [place], [place] [address]    Report Status PENDING  Incomplete    Radiology Report No results found.   Signature  -   Lavada Stank M.D on 01/27/2024 at 8:08 AM   -  To page go to [email or web address]   "

## 2024-01-27 NOTE — Progress Notes (Signed)
 Pharmacy Antibiotic Note  Joe Coleman is a 78 y.o. male admitted on 01/26/2024 with concern for RLE infection, hx tibia osteo. Pharmacy has been consulted for Daptomycin  and Ertapenem  dosing.   CK 28 on 12/23 labs - noted elevations while on Daptomycin  in Sept '25, will monitor closely for trends up with standing 2x/week checks for now  SCr 1.54, CrCl~30-35 ml/min - will monitor closely for dose adjustments for both Dapto and Erta.   Plan: - Daptomycin  500 mg IV every 24 hours - Ertapenem  1g IV every 24 hours - 2x/week CK checks on Tues/Fri - Will monitor renal function closely for necessary dose adjustments  Height: 5' 8.5 (174 cm) IBW/kg (Calculated) : 69.55  Temp (24hrs), Avg:98.6 F (37 C), Min:98.4 F (36.9 C), Max:99.1 F (37.3 C)  Recent Labs  Lab 01/24/24 1508 01/24/24 1510 01/24/24 1510 01/24/24 1837 01/24/24 2220 01/25/24 1140 01/25/24 1528 01/26/24 0500 01/27/24 0320  WBC  --  17.5*  --   --   --   --   --  16.3* 12.4*  CREATININE  --  1.31*   < > 1.38* 1.32* 1.45* 1.39* 1.37* 1.54*  LATICACIDVEN 1.2  --   --  1.6  --   --   --   --   --    < > = values in this interval not displayed.    Estimated Creatinine Clearance: 31.9 mL/min (A) (by C-G formula based on SCr of 1.54 mg/dL (H)).    Allergies[1]  Antimicrobials this admission: Vancomcyin 12/20 >> 12/23 Cefepime  12/20 >> 12/23 Flagyl  12/20 >> 12/23 Daptomycin  12/23 >> Ertapenem  12/23 >>  Dose adjustments this admission:  Microbiology results: 12/23 MRSA PCR >> positive 12/23 synovial fuid >>  Thank you for allowing pharmacy to be a part of this patients care.  Almarie Lunger, PharmD, BCPS, BCIDP Infectious Diseases Clinical Pharmacist 01/27/2024 3:45 PM   **Pharmacist phone directory can now be found on [email or web address] ([email or web address]).  Listed under [facility].      [1]  Allergies Allergen Reactions   Losartan Other (See Comments)    Hyperkalemia (>5.5 multiple times on losartan 25mg )    Chicken Allergy Nausea And Vomiting   Poultry Meal Nausea And Vomiting

## 2024-01-27 NOTE — TOC Initial Note (Signed)
 Transition of Care [facility]) - Initial/Assessment Note    Patient Details  Name: Joe Coleman MRN: [id number] Date of Birth: [birth year]  Transition of Care [facility]) CM/SW Contact:    Landry DELENA Senters, RN Phone Number: 01/27/2024, 10:08 AM  Clinical Narrative:                 RR:[id number] history significant of diabetes mellitus 2 on metformin , BPH on Flomax , CKD 3A, hypertension, GERD, COPD and history of DVT on Eliquis .  Patient was hospitalized back in September 2025 due to infected hardware in the right knee.  He underwent removal of hardware.  Wound cultures at that time positive for pansensitive Enterobacter and Klebsiella.  He was discharged home on linezolid  and doxycycline  as well as 25 days of IV ertapenem  to treat underlying osteomyelitis.  Readmitted to the hospital in November 2025 due to sepsis secondary to pneumonia.  He was discharged on Augmentin  as well as supportive care.  He states several days before he presented this time he followed up with his orthopedic team and follow-up surgical intervention was recommended   Patient lives alone, does have a cousin for support when needed, and will be transportation home at d/c.  Patient has caregiver for housekeeping services, M-F for about 3hours/day. He doesn't remember if caregiver is hired through an agency.   Patient has PCP, manages own meds, drives, DME reviewed, has current services for [facility] through [facility]. Active in [employer].   Continued medical workup.  CM will continue to follow.   Expected Discharge Plan:  (TBD) Barriers to Discharge: Continued Medical Work up   Patient Goals and CMS Choice            Expected Discharge Plan and Services       Living arrangements for the past 2 months: Apartment                                      Prior Living Arrangements/Services Living arrangements for the past 2 months: Apartment Lives with:: Self Patient language and need for interpreter reviewed:: Yes Do you  feel safe going back to the place where you live?: Yes      Need for Family Participation in Patient Care: Yes (Comment) Care giver support system in place?: No (comment) Current home services: DME, Home PT, Home RN, Housekeeping (cane, rolling walker, rollator, shower seat, HH [facility]) Criminal Activity/Legal Involvement Pertinent to Current Situation/Hospitalization: No - Comment as needed  Activities of Daily Living   ADL Screening (condition at time of admission) Independently performs ADLs?: No Does the patient have a NEW difficulty with bathing/dressing/toileting/self-feeding that is expected to last >3 days?: No Does the patient have a NEW difficulty with getting in/out of bed, walking, or climbing stairs that is expected to last >3 days?: No Does the patient have a NEW difficulty with communication that is expected to last >3 days?: No  Permission Sought/Granted                  Emotional Assessment Appearance:: Developmentally appropriate Attitude/Demeanor/Rapport: Engaged Affect (typically observed): Calm Orientation: : Oriented to Self, Oriented to Place, Oriented to Situation, Oriented to  Time Alcohol / Substance Use: Not Applicable Psych Involvement: No (comment)  Admission diagnosis:  Unspecified fracture of upper end of right tibia, initial encounter for closed fracture [S82.101A] Sepsis (HCC) [A41.9] Septic arthritis of knee, right (HCC) [M00.9] Patient Active Problem List  Diagnosis Date Noted   Septic arthritis of knee, right (HCC) 01/26/2024   Aspiration pneumonia (HCC) 12/21/2023   Hyperkalemia 12/21/2023   History of deep vein thrombosis (DVT) of lower extremity 12/21/2023   Acute urinary retention 12/21/2023   Sepsis due to pneumonia (HCC) 12/20/2023   Hardware complicating wound infection 10/29/2023   Uncontrolled type 2 diabetes mellitus with hyperglycemia, without long-term current use of insulin  (HCC) 10/29/2023   Type 2 diabetes mellitus with  peripheral neuropathy (HCC) 10/28/2023   GERD without esophagitis 10/28/2023   Chronic obstructive pulmonary disease (COPD) (HCC) 10/28/2023   Cellulitis of right leg 10/28/2023   Rhabdomyolysis 10/28/2023   Pressure injury of skin 10/28/2023   History of DVT (deep vein thrombosis) 10/28/2023   Normocytic anemia 10/28/2023   Acute metabolic encephalopathy 10/27/2023   Acute kidney injury superimposed on chronic kidney disease 10/27/2023   Open knee wound, right, sequela 10/14/2023   Malnutrition of moderate degree 08/30/2023   Tibia/fibula fracture 08/24/2023   Cellulitis of right arm 08/10/2023   Eye pain, bilateral 07/26/2022   Fall 07/26/2022   Lymphopenia 07/26/2022   Shortness of breath 05/21/2022   COVID-19 virus infection 03/17/2020   COPD with acute exacerbation (HCC) 03/17/2020   Type 2 diabetes mellitus (HCC) 03/17/2020   HTN (hypertension) 03/17/2020   Vitamin B12 deficiency 10/05/2019   Erectile dysfunction 11/10/2017   CKD stage 3b, GFR 30-44 ml/min (HCC) 09/19/2017   Mild intermittent asthma without complication 09/19/2017   Chronic cough 10/22/2016   Epigastric pain 06/30/2015   Chronic bilateral low back pain without sciatica 05/31/2015   Neuropathic pain 03/01/2015   Benign prostatic hyperplasia with lower urinary tract symptoms 04/06/2014   Adenomatous colon polyp 08/31/2013   Depression 06/21/2013   Foot pain, bilateral 05/07/2013   Insomnia 03/24/2013   Vitiligo 12/23/2012   Primary localized osteoarthrosis, lower leg 01/15/2011   Pain medication agreement broken 09/19/2010   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 [place] [address] Phone: [phone] Fax: [phone]  [facility] [address] [place] [address] Phone: [phone] Fax: [phone]  [facility] [address] [place] [address] Phone: [phone] Fax: [phone]     Social Drivers of Health (SDOH) Social History: SDOH Screenings   Food Insecurity: No Food Insecurity (01/26/2024)  Housing: Low Risk (01/26/2024)  Transportation Needs: No Transportation Needs (01/26/2024)  Utilities: Not At Risk (01/26/2024)  Financial Resource Strain: Medium Risk (05/02/2021)   Received from [facility]  Social Connections: Unknown (01/26/2024)  Tobacco Use: Medium Risk (01/24/2024)  Health Literacy: High Risk (05/04/2022)   Received from [facility]   SDOH Interventions:     Readmission Risk Interventions    09/01/2023   12:00 PM  Readmission Risk Prevention Plan  Transportation Screening Complete  Medication Review (RN Care Manager) Complete  PCP or Specialist appointment within 3-5 days of discharge Complete  HRI or Home Care Consult Complete  SW Recovery Care/Counseling Consult Complete  Palliative Care Screening Not Applicable  Skilled Nursing Facility Complete

## 2024-01-27 NOTE — Progress Notes (Addendum)
 Initial Nutrition Assessment  DOCUMENTATION CODES:   Severe malnutrition in context of chronic illness  INTERVENTION:  Continue Glucerna Shake po TID, each supplement provides 220 kcal and 10 grams of protein  Continue 30 ml ProSource Plus BID, each supplement provides 100 kcals and 15 grams protein.     Add Magic cup TID with meals, each supplement provides 290 kcal and 9 grams of protein  Add MVI w/ minerals   Collect new weight to establish recent weight trend  Draw wound lab panel to assess for deficiencies that may be impacting wound healing as intake is poor: Vitamins A, C, D, zinc , CRP  Monitor magnesium , potassium, and phosphorus daily for at least 3 days, MD to replete as needed, as pt is at risk for refeeding syndrome given severe malnutrition  -Add Thiamine  100 mg daily for 7 days    Continue carb modified diet to promote wound healing  -Encourage PO intake  -Automatic meal trays  -Collected food preferences and added to meal trays   NUTRITION DIAGNOSIS:  Severe Malnutrition related to chronic illness as evidenced by severe fat depletion, severe muscle depletion.  GOAL:  Patient will meet greater than or equal to 90% of their needs   MONITOR:   PO intake, Supplement acceptance, Labs, Skin  REASON FOR ASSESSMENT:   Consult Assessment of nutrition requirement/status  ASSESSMENT:   Pt with PMH significant for: T2DM, CKDIII, HTN, GERD, COPD, DVT (Eliquis ), and BPH on Flomax . Presented to [facility] on with recent fall and c/o progressively worsening R knee pain. Transferred to [facility] and admitted for large joint effusion and continued proximal tibia fracture.  Pt with multiple admissions in recent past. Noted with proximal tibia fracture in July 2025 requiring ORIF. Admitted in September of 2025 d/t infected hardware of R knee. Again admitted in November of this year for sepsis 2/2 PNA.   Infection has worsened and orthopedics following. Likely OR tomorrow and may  require AKA. IV ABX continue for now. ID to eval as well.   Average Meal Completion: No documented meal intake to review  Met with patient at bedside who was sleeping, but easy to rouse. Noted with breakfast tray untouched. States he does not like the hospital food and just isn't hungry. Difficult to obtain dietary recall as he lists a variety of foods and is inconsistent in reporting meal times. Only able to glean that he eats [employer] dirty rice and that he likes tomatoes. Does not prefer most animal proteins. Reports no N/V/C/D. Bowels stable. No difficulties chewing or swallowing at baseline, although with quite a few missing teeth.    Current Weight: 59 kg  No new weight collected on admission. Appears pulled forward from encounter last month. Will request new weight to assess trend. If current weight accurate, has shown 10.3% weight loss in last three months. This is considered clinically significant for the time frame reviewed.   Based on his NFPE, severe muscle and fat depletions consistent with prolonged inadequate energy intake and severe malnutrition in the context of chronic illness. Discussed with him the importance of both blood sugar control and protein intake in promoting wound healing. MD ordered Prosource. Recommended he continue this and try to consume Glucerna supplement to augment his poor PO intake as he does not feel like eating whole foods at this time. He verbalized understanding, however question his likelihood of follow through.   Given his poor PO intake, malnourished state, and impaired wound healing, recommend vitamin draw to assess for deficiencies  that may be impairing his wound healing.     Meds: vitamin D3, SS Novolog , pantoprazole , Miralax , IV ABX   Labs: Na+ 133 (L) K+ 4.4 (wdl) CRP 30.3 (H) WBC 12.4 (H) CBGs 111-166 x24 hours A1c 6.0 (01/2024)   NUTRITION - FOCUSED PHYSICAL EXAM:  Flowsheet Row Most Recent Value  Orbital Region Mild depletion   Upper Arm Region Severe depletion  Thoracic and Lumbar Region Severe depletion  Buccal Region Moderate depletion  Temple Region Mild depletion  Clavicle Bone Region Moderate depletion  Clavicle and Acromion Bone Region Severe depletion  Scapular Bone Region Severe depletion  Dorsal Hand Moderate depletion  Patellar Region Severe depletion  Anterior Thigh Region Severe depletion  Posterior Calf Region Severe depletion  Edema (RD Assessment) None  Hair Reviewed  Eyes Reviewed  Mouth Reviewed  [missing teeth]  Skin Reviewed  Nails Reviewed    Diet Order:   Diet Order             Diet Carb Modified Room service appropriate? Yes  Diet effective now             EDUCATION NEEDS:   Education needs have been addressed  Skin:  Skin Assessment: Reviewed RN Assessment  Last BM:  12/22  Height:  Ht Readings from Last 1 Encounters:  01/26/24 5' 8.5 (1.74 m)   Weight:  Wt Readings from Last 1 Encounters:  01/24/24 59 kg    Ideal Body Weight:  71.4 kg  BMI:  Body mass index is 19.49 kg/m.  Estimated Nutritional Needs:   Kcal:  1600-1800 kcals  Protein:  80-95g  Fluid:  >1.6L/day  Blair Deaner MS, RD, LDN Registered Dietitian Clinical Nutrition RD Inpatient Contact Info in Amion

## 2024-01-27 NOTE — Consult Note (Signed)
 Orthopaedic Trauma Service (OTS) Consult   Patient ID: Joe Coleman MRN: [id number] DOB/AGE: [birth year] 80 y.o.  Reason for Consult:Right knee wound Referring Provider: Dr. Lavada Stank, MD  HPI: Joe Coleman is a 78 y.o. male well-known to OTS/Dr. Haddix being seen in consultation for evaluation of right knee wound.  Patient sustained a right periprosthetic proximal tibia fracture in July 2025 which required ORIF.  Patient unfortunately had a subsequent postoperative infection that required I&D with hardware removal in September 2025.  Wound cultures at the time were positive for pansensitive Enterobacter and Klebsiella.  He was placed on antibiotics per infectious disease.  Patient presented to [facility] ED on 01/25/2024 after a recent fall with complaints of progressive right knee pain with associated erythema, swelling, drainage.  CT scan of the knee showed a moderate to large joint effusion with continued proximal tibia fracture.  Patient was started on IV antibiotics and transferred to [facility] for further evaluation and treatment.  Patient seen this morning on 5W.  Notes that his right knee pain is still present but has improved some over the last 2 to 3 days since being on antibiotics.  Still experiencing redness to the wound.  Denies any fever or chills.  Past Medical History:  Diagnosis Date   Cancer (HCC)    stomach and colon   Diabetes mellitus without complication (HCC)    Type II   Hypertension    Past Surgical History:  Procedure Laterality Date   APPENDECTOMY     COLON SURGERY     HARDWARE REMOVAL Right 10/15/2023   Procedure: REMOVAL, HARDWARE;  Surgeon: [name] Franky SQUIBB, MD;  Location: [facility] OR;  Service: Orthopedics;  Laterality: Right;  IRRIGATION AND DEBRIDEMENT, HARDWARE REMOVAL   ORIF TIBIA FRACTURE Right 08/25/2023   Procedure: OPEN REDUCTION INTERNAL FIXATION (ORIF) TIBIA FRACTURE, RIGHT;  Surgeon: [name] Franky SQUIBB, MD;  Location: [facility] OR;  Service: Orthopedics;   Laterality: Right;   Family History  Problem Relation Age of Onset   Heart disease Mother    Heart disease Brother     Social History:  reports that he has quit smoking. He has never used smokeless tobacco. He reports that he does not drink alcohol and does not use drugs.  Allergies: Allergies[1]  Medications: Prior to Admission medications  Medication Sig Start Date End Date Taking? Authorizing Provider  acetaminophen  (TYLENOL ) 325 MG tablet Take 2 tablets (650 mg total) by mouth every 6 (six) hours as needed for mild pain (pain score 1-3) or fever (or Fever >/= 101). 10/29/23  Yes Josette Ade, MD  albuterol  (PROVENTIL  HFA;VENTOLIN  HFA) 108 (90 Base) MCG/ACT inhaler Inhale 2 puffs into the lungs every 6 (six) hours as needed for wheezing or shortness of breath. 04/01/16  Yes Lang Dover, MD  apixaban  (ELIQUIS ) 5 MG TABS tablet Take 1 tablet (5 mg total) by mouth 2 (two) times daily. 12/22/23  Yes Laurita Pillion, MD  cholecalciferol  (CHOLECALCIFEROL ) 25 MCG tablet Take 1 tablet (1,000 Units total) by mouth daily. 08/28/23  Yes Danton Lauraine LABOR, PA-C  gabapentin  (NEURONTIN ) 300 MG capsule Take 300 mg by mouth at bedtime.   Yes [provider]  metFORMIN  (GLUCOPHAGE ) 500 MG tablet Take 500 mg by mouth 2 (two) times daily with a meal. 12/16/23 08/02/24 Yes [provider]  methocarbamol  (ROBAXIN ) 500 MG tablet Take 1 tablet (500 mg total) by mouth every 6 (six) hours as needed for muscle spasms. 10/24/23  Yes Patel, Pranav M, MD  polyethylene glycol (  MIRALAX  / GLYCOLAX ) 17 g packet Take 17 g by mouth daily. 09/01/23  Yes Sheikh, Omair Latif, DO  tamsulosin  (FLOMAX ) 0.4 MG CAPS capsule Take 1 capsule (0.4 mg total) by mouth daily. 12/23/23  Yes Laurita Pillion, MD  TRELEGY RUFINO 200-62.5-25 MCG/ACT AEPB Inhale 1 puff into the lungs daily. 07/17/23  Yes [provider]  ondansetron  (ZOFRAN ) 4 MG tablet Take 1 tablet (4 mg total) by mouth every 6 (six) hours as needed  for nausea. Patient not taking: Reported on 01/26/2024 10/29/23   Josette Ade, MD  oxyCODONE -acetaminophen  (PERCOCET/ROXICET) 5-325 MG tablet Take 1 tablet by mouth every 6 (six) hours as needed. Patient not taking: Reported on 01/26/2024 11/04/23   [provider]  pantoprazole  (PROTONIX ) 20 MG tablet Take 2 tablets (40 mg total) by mouth daily for 14 days. 12/05/23 01/26/24  Rogelia Jerilynn RAMAN, MD   I have reviewed the patient's current medications. Prior to Admission:  Medications Prior to Admission  Medication Sig Dispense Refill Last Dose/Taking   acetaminophen  (TYLENOL ) 325 MG tablet Take 2 tablets (650 mg total) by mouth every 6 (six) hours as needed for mild pain (pain score 1-3) or fever (or Fever >/= 101).   Unknown   albuterol  (PROVENTIL  HFA;VENTOLIN  HFA) 108 (90 Base) MCG/ACT inhaler Inhale 2 puffs into the lungs every 6 (six) hours as needed for wheezing or shortness of breath. 1 Inhaler 2 Past Week   apixaban  (ELIQUIS ) 5 MG TABS tablet Take 1 tablet (5 mg total) by mouth 2 (two) times daily. 60 tablet 0 01/25/2024 at  9:00 PM   cholecalciferol  (CHOLECALCIFEROL ) 25 MCG tablet Take 1 tablet (1,000 Units total) by mouth daily.   Past Month   gabapentin  (NEURONTIN ) 300 MG capsule Take 300 mg by mouth at bedtime.   01/25/2024   metFORMIN  (GLUCOPHAGE ) 500 MG tablet Take 500 mg by mouth 2 (two) times daily with a meal.   01/25/2024   methocarbamol  (ROBAXIN ) 500 MG tablet Take 1 tablet (500 mg total) by mouth every 6 (six) hours as needed for muscle spasms. 30 tablet 0 Past Month   polyethylene glycol (MIRALAX  / GLYCOLAX ) 17 g packet Take 17 g by mouth daily.   Unknown   tamsulosin  (FLOMAX ) 0.4 MG CAPS capsule Take 1 capsule (0.4 mg total) by mouth daily. 30 capsule 0 01/25/2024   TRELEGY ELLIPTA 200-62.5-25 MCG/ACT AEPB Inhale 1 puff into the lungs daily.   01/25/2024   ondansetron  (ZOFRAN ) 4 MG tablet Take 1 tablet (4 mg total) by mouth every 6 (six) hours as needed for  nausea. (Patient not taking: Reported on 01/26/2024)   Not Taking   oxyCODONE -acetaminophen  (PERCOCET/ROXICET) 5-325 MG tablet Take 1 tablet by mouth every 6 (six) hours as needed. (Patient not taking: Reported on 01/26/2024)   Not Taking   pantoprazole  (PROTONIX ) 20 MG tablet Take 2 tablets (40 mg total) by mouth daily for 14 days. 28 tablet 0 01/25/2024    Positive ROS: All other systems have been reviewed and were otherwise negative with the exception of those mentioned in the HPI and as above.  Exam: Blood pressure 126/69, pulse (!) 104, temperature 98.4 F (36.9 C), temperature source Axillary, resp. rate 18, height 5' 8.5 (1.74 m), SpO2 92%. General: Alert and oriented, no acute distress Cardiovascular: No pedal edema Respiratory: No cyanosis, no use of accessory musculature GI: No organomegaly, abdomen is soft and non-tender Skin: No lesions in the area of chief complaint Neurologic: Sensation intact distally Psychiatric: Patient is competent for consent  with normal mood and affect  Musculoskeletal: RLE: Wound as below.  Surrounding redness.  Notable tenderness with palpation over the area.  Moderate joint effusion.  Pain with motion of the knee.  Nontender through the calf. + DP pulse   LLE: Skin without lesions. No tenderness to palpation. Full painless ROM, full strength in each muscle group without evidence of instability. Motor/sensory function at baseline. Neurovascularly intact.   Medical Decision Making: Data: Imaging: CT scan right knee shows persistent comminuted proximal tibia fracture with no significant bony bridging.  Moderate to large joint effusion.  No focal fluid collection noted.  No bony erosion appreciated  Labs:  Results for orders placed or performed during the hospital encounter of 01/26/24 (from the past 24 hours)  Glucose, capillary     Status: Abnormal   Collection Time: 01/26/24  5:18 PM  Result Value Ref Range   Glucose-Capillary 111 (H) 70 - 99  mg/dL  Glucose, capillary     Status: Abnormal   Collection Time: 01/26/24  9:07 PM  Result Value Ref Range   Glucose-Capillary 132 (H) 70 - 99 mg/dL  Basic metabolic panel     Status: Abnormal   Collection Time: 01/27/24  3:20 AM  Result Value Ref Range   Sodium 133 (L) 135 - 145 mmol/L   Potassium 4.4 3.5 - 5.1 mmol/L   Chloride 102 98 - 111 mmol/L   CO2 19 (L) 22 - 32 mmol/L   Glucose, Bld 128 (H) 70 - 99 mg/dL   BUN 24 (H) 8 - 23 mg/dL   Creatinine, Ser 8.45 (H) 0.61 - 1.24 mg/dL   Calcium  8.4 (L) 8.9 - 10.3 mg/dL   GFR, Estimated 45 (L) >60 mL/min   Anion gap 13 5 - 15  CBC     Status: Abnormal   Collection Time: 01/27/24  3:20 AM  Result Value Ref Range   WBC 12.4 (H) 4.0 - 10.5 K/uL   RBC 3.05 (L) 4.22 - 5.81 MIL/uL   Hemoglobin 8.4 (L) 13.0 - 17.0 g/dL   HCT 72.2 (L) 60.9 - 47.9 %   MCV 90.8 80.0 - 100.0 fL   MCH 27.5 26.0 - 34.0 pg   MCHC 30.3 30.0 - 36.0 g/dL   RDW 85.1 88.4 - 84.4 %   Platelets 227 150 - 400 K/uL   nRBC 0.0 0.0 - 0.2 %  Type and screen     Status: None   Collection Time: 01/27/24  5:41 AM  Result Value Ref Range   ABO/RH(D) O POS    Antibody Screen NEG    Sample Expiration      01/30/2024,2359 Performed at [facility], [address], [place], [place] [address]   Procalcitonin     Status: None   Collection Time: 01/27/24  5:42 AM  Result Value Ref Range   Procalcitonin 1.38 ng/mL  C-reactive protein     Status: Abnormal   Collection Time: 01/27/24  5:42 AM  Result Value Ref Range   CRP 30.3 (H) <1.0 mg/dL  Glucose, capillary     Status: Abnormal   Collection Time: 01/27/24  8:23 AM  Result Value Ref Range   Glucose-Capillary 135 (H) 70 - 99 mg/dL    Imaging or Labs ordered: Joint aspiration culture ordered  Medical history and chart was reviewed and case discussed with attending provider.  Assessment/Plan: 78 year old male status post ORIF right periprosthetic proximal tibia fracture July 2025 with subsequent I&D and  hardware removal September 2025.  Presenting now for worsening right knee pain, swelling, wound drainage  I will plan to aspirate patient's right knee joint today and send for cell count and culture.  Would recommend continuing with IV antibiotics for suppression of infection.  We will see how patient does clinically over the next few days and if no improvement with IV antibiotics may require debridement in the OR.  This would not be done any earlier than Friday, 01/30/2024.  Will follow-up on Gram stain and culture results of arthrocentesis when available.  Lauraine PATRIC Moores PA-C [facility] [phone] (office) [email or web address]     [1]  Allergies Allergen Reactions   Losartan Other (See Comments)    Hyperkalemia (>5.5 multiple times on losartan 25mg )   Chicken Allergy Nausea And Vomiting   Poultry Meal Nausea And Vomiting

## 2024-01-28 DIAGNOSIS — M009 Pyogenic arthritis, unspecified: Secondary | ICD-10-CM | POA: Diagnosis not present

## 2024-01-28 DIAGNOSIS — E43 Unspecified severe protein-calorie malnutrition: Secondary | ICD-10-CM | POA: Insufficient documentation

## 2024-01-28 LAB — CBC WITH DIFFERENTIAL/PLATELET
Abs Immature Granulocytes: 0.06 K/uL (ref 0.00–0.07)
Basophils Absolute: 0 K/uL (ref 0.0–0.1)
Basophils Relative: 0 %
Eosinophils Absolute: 0.1 K/uL (ref 0.0–0.5)
Eosinophils Relative: 1 %
HCT: 26.1 % — ABNORMAL LOW (ref 39.0–52.0)
Hemoglobin: 8.1 g/dL — ABNORMAL LOW (ref 13.0–17.0)
Immature Granulocytes: 1 %
Lymphocytes Relative: 7 %
Lymphs Abs: 0.8 K/uL (ref 0.7–4.0)
MCH: 27.4 pg (ref 26.0–34.0)
MCHC: 31 g/dL (ref 30.0–36.0)
MCV: 88.2 fL (ref 80.0–100.0)
Monocytes Absolute: 1.2 K/uL — ABNORMAL HIGH (ref 0.1–1.0)
Monocytes Relative: 11 %
Neutro Abs: 8.8 K/uL — ABNORMAL HIGH (ref 1.7–7.7)
Neutrophils Relative %: 80 %
Platelets: 244 K/uL (ref 150–400)
RBC: 2.96 MIL/uL — ABNORMAL LOW (ref 4.22–5.81)
RDW: 14.4 % (ref 11.5–15.5)
WBC: 11 K/uL — ABNORMAL HIGH (ref 4.0–10.5)
nRBC: 0 % (ref 0.0–0.2)

## 2024-01-28 LAB — PROCALCITONIN: Procalcitonin: 0.96 ng/mL

## 2024-01-28 LAB — BASIC METABOLIC PANEL WITH GFR
Anion gap: 13 (ref 5–15)
BUN: 23 mg/dL (ref 8–23)
CO2: 18 mmol/L — ABNORMAL LOW (ref 22–32)
Calcium: 8.6 mg/dL — ABNORMAL LOW (ref 8.9–10.3)
Chloride: 103 mmol/L (ref 98–111)
Creatinine, Ser: 1.54 mg/dL — ABNORMAL HIGH (ref 0.61–1.24)
GFR, Estimated: 45 mL/min — ABNORMAL LOW
Glucose, Bld: 132 mg/dL — ABNORMAL HIGH (ref 70–99)
Potassium: 4 mmol/L (ref 3.5–5.1)
Sodium: 133 mmol/L — ABNORMAL LOW (ref 135–145)

## 2024-01-28 LAB — GLUCOSE, CAPILLARY
Glucose-Capillary: 198 mg/dL — ABNORMAL HIGH (ref 70–99)
Glucose-Capillary: 164 mg/dL — ABNORMAL HIGH (ref 70–99)
Glucose-Capillary: 101 mg/dL — ABNORMAL HIGH (ref 70–99)
Glucose-Capillary: 175 mg/dL — ABNORMAL HIGH (ref 70–99)

## 2024-01-28 LAB — MAGNESIUM: Magnesium: 1.9 mg/dL (ref 1.7–2.4)

## 2024-01-28 LAB — PHOSPHORUS: Phosphorus: 2.4 mg/dL — ABNORMAL LOW (ref 2.5–4.6)

## 2024-01-28 LAB — VITAMIN D 25 HYDROXY (VIT D DEFICIENCY, FRACTURES): Vit D, 25-Hydroxy: 12 ng/mL — ABNORMAL LOW (ref 30–100)

## 2024-01-28 LAB — C-REACTIVE PROTEIN: CRP: 25.9 mg/dL — ABNORMAL HIGH

## 2024-01-28 MED ORDER — VITAMIN D (ERGOCALCIFEROL) 1.25 MG (50000 UNIT) PO CAPS
50000.0000 [IU] | ORAL_CAPSULE | ORAL | Status: DC
  Administered 2024-01-28: 50000 [IU] via ORAL
  Filled 2024-01-28: qty 1

## 2024-01-28 NOTE — Plan of Care (Signed)
  Problem: Health Behavior/Discharge Planning: Goal: Ability to manage health-related needs will improve Outcome: Progressing   Problem: Clinical Measurements: Goal: Will remain free from infection Outcome: Progressing   Problem: Activity: Goal: Risk for activity intolerance will decrease Outcome: Progressing   Problem: Coping: Goal: Level of anxiety will decrease Outcome: Progressing   Problem: Pain Managment: Goal: General experience of comfort will improve and/or be controlled Outcome: Progressing   Problem: Skin Integrity: Goal: Risk for impaired skin integrity will decrease Outcome: Progressing

## 2024-01-28 NOTE — Progress Notes (Signed)
 "                                                                                                                                                                                                                                                                                PROGRESS NOTE     Patient Demographics:    Joe Coleman, is a 78 y.o. male, DOB - 1945-08-11, FMW:[id number]  Outpatient Primary MD for the patient is [facility]    LOS - 2  Admit date - 01/26/2024    No chief complaint on file.      Brief Narrative (HPI from H&P)    78 y.o. male with medical history significant of diabetes mellitus 2 on metformin , BPH on Flomax , CKD 3A, hypertension, GERD, COPD and history of DVT on Eliquis .  Patient was hospitalized back in September 2025 due to infected hardware in the right knee.  He underwent removal of hardware.  Wound cultures at that time positive for pansensitive Enterobacter and Klebsiella.  He was discharged home on linezolid  and doxycycline  as well as 25 days of IV ertapenem  to treat underlying osteomyelitis.  Readmitted to the hospital in November 2025 due to sepsis secondary to pneumonia.  He was discharged on Augmentin  as well as supportive care.    He states several days before he presented this time he followed up with his orthopedic team and follow-up surgical intervention was recommended.  He states he does have some memory issues and this may not be an accurate assessment.  He reports about 2 days ago he began having significant increase in pain and drainage and increased redness at the prior right lower extremity wound below the knee.  He had run out of his prior Percocet and was having difficulty managing his pain.  He presented to the ED where CT of the knee and lower extremity revealed a moderate to large thick-walled joint effusion as well as a comminuted proximal tibia fracture.  He was evaluated by Dr. Cristy with orthopedic team at [facility] and due to the complexity of his case it was recommended that he be transferred to [facility]    Subjective:  Joe Coleman today reports right knee pain.  Max 100.1.    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  Cultures during that admission were positive for pansensitive Enterobacter and Klebsiella.   - Antibiotics management per ID  - Hepatic input greatly appreciated, status post arthrocentesis 12/23, with cell count of 69,000, plan for I&D of right knee by [name] this Friday.     AKI on CKD IIIa baseline creatinine around 1.3, gentle hydration IV fluids, monitor bladder scans and BMP.   History of DVT on Eliquis  currently on Lovenox .   BPH Continue Flomax    Hypertension placed on low-dose Coreg .   COPD  Continue as needed albuterol  neb, On Trelegy at home-therapeutic substitution with Breztri , stable.   GERD  Continue PPI   Moderate protein calorie malnutrition Current BMI 19.49, add protein supplement  Diabetes mellitus 2 - SSI.  Lab Results  Component Value Date   HGBA1C 6.0 (H) 01/25/2024   CBG (last 3)  Recent Labs    01/27/24 2111 01/28/24 0844 01/28/24 1203  GLUCAP 115* 198* 164*       Condition - Extremely Guarded  Family Communication  : None at bedside  Code Status : Full code  Consults  : Orthopedics, ID  PUD Prophylaxis : PPI   Procedures  :      CT 01/24/24 - R Knee -   1. Knee arthroplasty in expected alignment. Comminuted proximal tibial fracture extends to the lateral aspect of the tibial tray as well as the cement adjacent to the tibial stem. Tibial metaphyseal and tibial shaft component demonstrates persistent fracture lucency and no bony bridging, suspicious for delayed/nonunion. 2. Proximal fibular fracture has incomplete surrounding callus  formation. 3. Moderate to large thick-walled joint effusion with synovial enhancement. This may be inflammatory or infectious, consider fluid sampling. 4. Generalized soft tissue edema. No focal fluid collection. No soft tissue gas.      Disposition Plan  :    Status is: Inpatient   DVT Prophylaxis  : Lovenox     Lab Results  Component Value Date   PLT 244 01/28/2024    Diet :  Diet Order             Diet NPO time specified  Diet effective midnight           Diet Carb Modified Room service appropriate? Yes  Diet effective now                    Inpatient Medications  Scheduled Meds:  (feeding supplement) PROSource Plus  30 mL Oral BID BM   budesonide -glycopyrrolate -formoterol   2 puff Inhalation BID   carvedilol   3.125 mg Oral BID WC   vitamin D3  1,000 Units Oral Daily   enoxaparin  (LOVENOX ) injection  60 mg Subcutaneous Q12H   feeding supplement (GLUCERNA SHAKE)  237 mL Oral TID BM   gabapentin   300 mg Oral QHS   insulin  aspart  0-5 Units Subcutaneous QHS   insulin  aspart  0-9 Units Subcutaneous TID WC   multivitamin with minerals  1 tablet Oral Daily   pantoprazole   40 mg Oral Daily   polyethylene glycol  17 g Oral Daily   sodium chloride  flush  3 mL Intravenous Q12H   tamsulosin   0.4 mg Oral Daily   thiamine   100 mg Per Tube  Daily   Vitamin D  (Ergocalciferol )  50,000 Units Oral Q7 days   Continuous Infusions:  DAPTOmycin  500 mg (01/27/24 1756)   ertapenem  1 g (01/27/24 1701)   PRN Meds:.acetaminophen  **OR** [DISCONTINUED] acetaminophen , methocarbamol , morphine  injection, ondansetron  **OR** ondansetron  (ZOFRAN ) IV, oxyCODONE   Antibiotics  :    Anti-infectives (From admission, onward)    Start     Dose/Rate Route Frequency Ordered Stop   01/27/24 1800  DAPTOmycin  (CUBICIN ) IVPB 500 mg/55mL premix        8 mg/kg  59 kg 100 mL/hr over 30 Minutes Intravenous Daily 01/27/24 1036     01/27/24 1600  ertapenem  (INVANZ ) 1 g in sodium chloride  0.9 % 100 mL  IVPB        1 g 200 mL/hr over 30 Minutes Intravenous Every 24 hours 01/27/24 1036     01/27/24 0915  ceFEPIme  (MAXIPIME ) 2 g in sodium chloride  0.9 % 100 mL IVPB  Status:  Discontinued        2 g 200 mL/hr over 30 Minutes Intravenous Every 12 hours 01/27/24 0825 01/27/24 1036   01/27/24 0500  vancomycin  (VANCOREADY) IVPB 750 mg/150 mL  Status:  Discontinued        750 mg 150 mL/hr over 60 Minutes Intravenous Every 24 hours 01/26/24 1616 01/27/24 1036   01/26/24 2200  cefTRIAXone  (ROCEPHIN ) 2 g in sodium chloride  0.9 % 100 mL IVPB  Status:  Discontinued        2 g 200 mL/hr over 30 Minutes Intravenous Daily at bedtime 01/26/24 1616 01/27/24 0825   01/26/24 1700  metroNIDAZOLE  (FLAGYL ) IVPB 500 mg  Status:  Discontinued        500 mg 100 mL/hr over 60 Minutes Intravenous Every 12 hours 01/26/24 1605 01/27/24 1036         Objective:   Vitals:   01/28/24 0845 01/28/24 0900 01/28/24 1100 01/28/24 1200  BP: 133/68   129/64  Pulse: 100  89 93  Resp: 17  19 (!) 21  Temp: 98 F (36.7 C) 98.8 F (37.1 C)  98.4 F (36.9 C)  TempSrc: Oral Oral  Oral  SpO2: 94%  93% 96%  Weight:      Height:        Wt Readings from Last 3 Encounters:  01/28/24 65.4 kg  01/24/24 59 kg  12/20/23 59 kg     Intake/Output Summary (Last 24 hours) at 01/28/2024 1554 Last data filed at 01/28/2024 1100 Gross per 24 hour  Intake 3 ml  Output 1850 ml  Net -1847 ml     Physical Exam  Awake Alert, Oriented X 3, frail, chronically ill-appearing CTAB RRR,No Gallops,Rubs or new Murmurs, No Parasternal Heave +ve B.Sounds, Abd Soft, No tenderness, No rebound - guarding or rigidity.   R Knee on 01/26/2024       Data Review:    Recent Labs  Lab 01/24/24 1510 01/26/24 0500 01/27/24 0320 01/28/24 0150  WBC 17.5* 16.3* 12.4* 11.0*  HGB 9.8* 9.0* 8.4* 8.1*  HCT 32.4* 29.5* 27.7* 26.1*  PLT 341 206 227 244  MCV 91.8 90.5 90.8 88.2  MCH 27.8 27.6 27.5 27.4  MCHC 30.2 30.5 30.3 31.0  RDW  15.5 14.8 14.8 14.4  LYMPHSABS 1.0  --   --  0.8  MONOABS 1.7*  --   --  1.2*  EOSABS 0.1  --   --  0.1  BASOSABS 0.1  --   --  0.0    Recent Labs  Lab  0000  01/24/24 1508 01/24/24 1510 01/24/24 1611 01/24/24 1837 01/24/24 2220 01/25/24 1140 01/25/24 1528 01/25/24 1825 01/26/24 0500 01/27/24 0320 01/27/24 0542 01/27/24 1309 01/28/24 0150  NA   < >  --  135  --  132* 132*  --  134*  --  135 133*  --   --  133*  K  --   --  6.4*   < > 6.6* 4.9  --  4.7  --  4.2 4.4  --   --  4.0  CL   < >  --  104  --  100 101  --  101  --  106 102  --   --  103  CO2   < >  --  23  --  19* 21*  --  23  --  21* 19*  --   --  18*  ANIONGAP   < >  --  8  --  13 10  --  10  --  9 13  --   --  13  GLUCOSE   < >  --  171*  --  229* 80  --  156*  --  149* 128*  --   --  132*  BUN   < >  --  18  --  18 16  --  20  --  21 24*  --   --  23  CREATININE   < >  --  1.31*  --  1.38* 1.32* 1.45* 1.39*  --  1.37* 1.54*  --   --  1.54*  AST  --   --  20  --   --   --   --   --   --   --   --   --   --   --   ALT  --   --  6  --   --   --   --   --   --   --   --   --   --   --   ALKPHOS  --   --  174*  --   --   --   --   --   --   --   --   --   --   --   BILITOT  --   --  0.5  --   --   --   --   --   --   --   --   --   --   --   ALBUMIN   --   --  3.8  --   --   --   --   --   --   --   --   --   --   --   CRP  --   --   --   --  9.1*  --   --   --   --   --   --  30.3*  --  25.9*  PROCALCITON  --   --   --   --  0.52  --   --   --   --   --   --  1.38  --  0.96  LATICACIDVEN  --  1.2  --   --  1.6  --   --   --   --   --   --   --   --   --   INR  --   --  1.0  --   --   --   --   --   --   --   --   --   --   --   HGBA1C  --   --   --   --   --   --   --   --  6.0*  --   --   --   --   --   MG  --   --   --   --   --   --   --   --   --   --   --   --  1.9 1.9  PHOS  --   --   --   --   --   --   --   --   --   --   --   --  3.0 2.4*  CALCIUM    < >  --  8.9  --  8.4* 8.6*  --  8.4*  --  7.7* 8.4*  --    --  8.6*   < > = values in this interval not displayed.      Recent Labs  Lab 01/24/24 1508 01/24/24 1510 01/24/24 1510 01/24/24 1837 01/24/24 2220 01/25/24 1528 01/25/24 1825 01/26/24 0500 01/27/24 0320 01/27/24 0542 01/27/24 1309 01/28/24 0150  CRP  --   --   --  9.1*  --   --   --   --   --  30.3*  --  25.9*  PROCALCITON  --   --   --  0.52  --   --   --   --   --  1.38  --  0.96  LATICACIDVEN 1.2  --   --  1.6  --   --   --   --   --   --   --   --   INR  --  1.0  --   --   --   --   --   --   --   --   --   --   HGBA1C  --   --   --   --   --   --  6.0*  --   --   --   --   --   MG  --   --   --   --   --   --   --   --   --   --  1.9 1.9  CALCIUM   --  8.9   < > 8.4* 8.6* 8.4*  --  7.7* 8.4*  --   --  8.6*   < > = values in this interval not displayed.    --------------------------------------------------------------------------------------------------------------- No results found for: CHOL, HDL, LDLCALC, LDLDIRECT, TRIG, CHOLHDL  Lab Results  Component Value Date   HGBA1C 6.0 (H) 01/25/2024   No results for input(s): TSH, T4TOTAL, FREET4, T3FREE, THYROIDAB in the last 72 hours. No results for input(s): VITAMINB12, FOLATE, FERRITIN, TIBC, IRON , RETICCTPCT in the last 72 hours. ------------------------------------------------------------------------------------------------------------------ Cardiac Enzymes No results for input(s): CKMB, TROPONINI, MYOGLOBIN in the last 168 hours.  Invalid input(s): CK  Micro Results Recent Results (from the past 240 hours)  Resp panel by RT-PCR (RSV, Flu A&B, Covid) Anterior Nasal Swab     Status: None   Collection Time: 01/24/24  3:08 PM   Specimen: Anterior Nasal Swab  Result Value Ref Range Status   SARS  Coronavirus 2 by RT PCR NEGATIVE NEGATIVE Final    Comment: (NOTE) SARS-CoV-2 target nucleic acids are NOT DETECTED.  The SARS-CoV-2 RNA is generally detectable in upper  respiratory specimens during the acute phase of infection. The lowest concentration of SARS-CoV-2 viral copies this assay can detect is 138 copies/mL. A negative result does not preclude SARS-Cov-2 infection and should not be used as the sole basis for treatment or other patient management decisions. A negative result may occur with  improper specimen collection/handling, submission of specimen other than nasopharyngeal swab, presence of viral mutation(s) within the areas targeted by this assay, and inadequate number of viral copies(<138 copies/mL). A negative result must be combined with clinical observations, patient history, and epidemiological information. The expected result is Negative.  Fact Sheet for Patients:  bloggercourse.com  Fact Sheet for Healthcare Providers:  seriousbroker.it  This test is no t yet approved or cleared by the [place]  FDA and  has been authorized for detection and/or diagnosis of SARS-CoV-2 by FDA under an Emergency Use Authorization (EUA). This EUA will remain  in effect (meaning this test can be used) for the duration of the COVID-19 declaration under Section 564(b)(1) of the Act, 21 U.S.C.section 360bbb-3(b)(1), unless the authorization is terminated  or revoked sooner.       Influenza A by PCR NEGATIVE NEGATIVE Final   Influenza B by PCR NEGATIVE NEGATIVE Final    Comment: (NOTE) The Xpert Xpress SARS-CoV-2/FLU/RSV plus assay is intended as an aid in the diagnosis of influenza from Nasopharyngeal swab specimens and should not be used as a sole basis for treatment. Nasal washings and aspirates are unacceptable for Xpert Xpress SARS-CoV-2/FLU/RSV testing.  Fact Sheet for Patients: bloggercourse.com  Fact Sheet for Healthcare Providers: seriousbroker.it  This test is not yet approved or cleared by the [place]  FDA and has been  authorized for detection and/or diagnosis of SARS-CoV-2 by FDA under an Emergency Use Authorization (EUA). This EUA will remain in effect (meaning this test can be used) for the duration of the COVID-19 declaration under Section 564(b)(1) of the Act, 21 U.S.C. section 360bbb-3(b)(1), unless the authorization is terminated or revoked.     Resp Syncytial Virus by PCR NEGATIVE NEGATIVE Final    Comment: (NOTE) Fact Sheet for Patients: bloggercourse.com  Fact Sheet for Healthcare Providers: seriousbroker.it  This test is not yet approved or cleared by the [place]  FDA and has been authorized for detection and/or diagnosis of SARS-CoV-2 by FDA under an Emergency Use Authorization (EUA). This EUA will remain in effect (meaning this test can be used) for the duration of the COVID-19 declaration under Section 564(b)(1) of the Act, 21 U.S.C. section 360bbb-3(b)(1), unless the authorization is terminated or revoked.  Performed at [facility], [address] [place], [place] [address]   Blood Culture (routine x 2)     Status: None (Preliminary result)   Collection Time: 01/24/24  3:20 PM   Specimen: BLOOD  Result Value Ref Range Status   Specimen Description BLOOD BLOOD LEFT HAND  Final   Special Requests   Final    Blood Culture results may not be optimal due to an inadequate volume of blood received in culture bottles   Culture   Final    NO GROWTH 4 DAYS Performed at [facility], [address], [place], [place] [address]    Report Status PENDING  Incomplete  Blood Culture (routine x 2)     Status: None (Preliminary result)   Collection Time:  01/24/24  3:20 PM   Specimen: BLOOD  Result Value Ref Range Status   Specimen Description BLOOD BLOOD RIGHT ARM  Final   Special Requests   Final    Blood Culture results may not be optimal due to an inadequate volume of blood received in culture bottles   Culture    Final    NO GROWTH 4 DAYS Performed at [facility], [address], [place], [place] [address]    Report Status PENDING  Incomplete  MRSA Next Gen by PCR, Nasal     Status: Abnormal   Collection Time: 01/27/24  6:02 AM   Specimen: Nasal Mucosa; Nasal Swab  Result Value Ref Range Status   MRSA by PCR Next Gen DETECTED (A) NOT DETECTED Final    Comment: RESULT CALLED TO, READ BACK BY AND VERIFIED WITH: RN JESSICA B [id number] FCP (NOTE) The GeneXpert MRSA Assay (FDA approved for NASAL specimens only), is one component of a comprehensive MRSA colonization surveillance program. It is not intended to diagnose MRSA infection nor to guide or monitor treatment for MRSA infections. Test performance is not FDA approved in patients less than 82 years old. Performed at [facility], [address] [place], [place] [address]   Body fluid culture w Gram Stain     Status: None (Preliminary result)   Collection Time: 01/27/24 10:18 AM   Specimen: Synovium; Body Fluid  Result Value Ref Range Status   Specimen Description SYNOVIAL  Final   Special Requests Normal  Final   Gram Stain   Final    ABUNDANT WBC PRESENT, PREDOMINANTLY PMN NO ORGANISMS SEEN    Culture   Final    RARE STAPHYLOCOCCUS AUREUS SUSCEPTIBILITIES TO FOLLOW CRITICAL RESULT CALLED TO, READ BACK BY AND VERIFIED WITH: RN MARLA GRAYSON [phone] FCP Performed at [facility], [address], [place], [place] [address]    Report Status PENDING  Incomplete    Radiology Report No results found.   Signature  -   Brayton Lye M.D on 01/28/2024 at 3:54 PM   -  To page go to [email or web address]   "

## 2024-01-28 NOTE — Progress Notes (Signed)
 Ortho Trauma Progress Note   Arthrocentesis right knee performed 01/27/24 revealed cell count of 69,000. We will plan for I&D of right knee with [name] on Friday. Please keep patient NPO after midnight 01/30/24. Appreciate assistance from infectious disease team. Continue IV antibiotics further recommendations. Patient may ultimately require an above knee amputation versus suppression with antibiotics until his tibia fracture fully heals, but we will discuss these options further with patient following the I&D.  Lauraine PATRIC Moores PA-C [facility] [phone] (office) [email or web address]

## 2024-01-28 NOTE — Plan of Care (Signed)

## 2024-01-29 DIAGNOSIS — M009 Pyogenic arthritis, unspecified: Secondary | ICD-10-CM | POA: Diagnosis not present

## 2024-01-29 LAB — GLUCOSE, CAPILLARY
Glucose-Capillary: 128 mg/dL — ABNORMAL HIGH (ref 70–99)
Glucose-Capillary: 204 mg/dL — ABNORMAL HIGH (ref 70–99)
Glucose-Capillary: 109 mg/dL — ABNORMAL HIGH (ref 70–99)
Glucose-Capillary: 266 mg/dL — ABNORMAL HIGH (ref 70–99)

## 2024-01-29 LAB — BODY FLUID CULTURE W GRAM STAIN: Special Requests: NORMAL

## 2024-01-29 LAB — BASIC METABOLIC PANEL WITH GFR
Anion gap: 8 (ref 5–15)
BUN: 20 mg/dL (ref 8–23)
CO2: 24 mmol/L (ref 22–32)
Calcium: 8.4 mg/dL — ABNORMAL LOW (ref 8.9–10.3)
Chloride: 103 mmol/L (ref 98–111)
Creatinine, Ser: 1.4 mg/dL — ABNORMAL HIGH (ref 0.61–1.24)
GFR, Estimated: 51 mL/min — ABNORMAL LOW
Glucose, Bld: 171 mg/dL — ABNORMAL HIGH (ref 70–99)
Potassium: 3.9 mmol/L (ref 3.5–5.1)
Sodium: 135 mmol/L (ref 135–145)

## 2024-01-29 LAB — CBC WITH DIFFERENTIAL/PLATELET
Abs Immature Granulocytes: 0.05 K/uL (ref 0.00–0.07)
Basophils Absolute: 0 K/uL (ref 0.0–0.1)
Basophils Relative: 1 %
Eosinophils Absolute: 0.2 K/uL (ref 0.0–0.5)
Eosinophils Relative: 2 %
HCT: 25.9 % — ABNORMAL LOW (ref 39.0–52.0)
Hemoglobin: 8 g/dL — ABNORMAL LOW (ref 13.0–17.0)
Immature Granulocytes: 1 %
Lymphocytes Relative: 10 %
Lymphs Abs: 0.9 K/uL (ref 0.7–4.0)
MCH: 27.2 pg (ref 26.0–34.0)
MCHC: 30.9 g/dL (ref 30.0–36.0)
MCV: 88.1 fL (ref 80.0–100.0)
Monocytes Absolute: 1 K/uL (ref 0.1–1.0)
Monocytes Relative: 12 %
Neutro Abs: 6.6 K/uL (ref 1.7–7.7)
Neutrophils Relative %: 74 %
Platelets: 242 K/uL (ref 150–400)
RBC: 2.94 MIL/uL — ABNORMAL LOW (ref 4.22–5.81)
RDW: 14.5 % (ref 11.5–15.5)
WBC: 8.8 K/uL (ref 4.0–10.5)
nRBC: 0 % (ref 0.0–0.2)

## 2024-01-29 LAB — C-REACTIVE PROTEIN: CRP: 18.6 mg/dL — ABNORMAL HIGH

## 2024-01-29 LAB — PHOSPHORUS: Phosphorus: 2.2 mg/dL — ABNORMAL LOW (ref 2.5–4.6)

## 2024-01-29 LAB — CULTURE, BLOOD (ROUTINE X 2)
Culture: NO GROWTH
Culture: NO GROWTH

## 2024-01-29 LAB — PROCALCITONIN: Procalcitonin: 0.55 ng/mL

## 2024-01-29 LAB — MAGNESIUM: Magnesium: 2 mg/dL (ref 1.7–2.4)

## 2024-01-29 MED ORDER — K PHOS MONO-SOD PHOS DI & MONO 155-852-130 MG PO TABS
500.0000 mg | ORAL_TABLET | Freq: Three times a day (TID) | ORAL | Status: AC
  Administered 2024-01-29 (×2): 500 mg via ORAL
  Filled 2024-01-29 (×3): qty 2

## 2024-01-29 MED ORDER — CYANOCOBALAMIN 1000 MCG/ML IJ SOLN: 1000.0000 ug | INTRAMUSCULAR | Status: DC

## 2024-01-29 MED ORDER — CYANOCOBALAMIN 1000 MCG/ML IJ SOLN
1000.0000 ug | Freq: Every day | INTRAMUSCULAR | Status: DC
  Administered 2024-01-29 – 2024-02-03 (×6): 1000 ug via SUBCUTANEOUS
  Filled 2024-01-29 (×4): qty 1

## 2024-01-29 NOTE — Plan of Care (Signed)

## 2024-01-29 NOTE — Plan of Care (Signed)
" °  Problem: Health Behavior/Discharge Planning: Goal: Ability to manage health-related needs will improve Outcome: Progressing   Problem: Clinical Measurements: Goal: Will remain free from infection Outcome: Progressing   Problem: Activity: Goal: Risk for activity intolerance will decrease Outcome: Progressing   Problem: Nutrition: Goal: Adequate nutrition will be maintained Outcome: Progressing   Problem: Pain Managment: Goal: General experience of comfort will improve and/or be controlled Outcome: Progressing   Problem: Skin Integrity: Goal: Risk for impaired skin integrity will decrease Outcome: Progressing   "

## 2024-01-29 NOTE — Progress Notes (Signed)
 "                                                                                                                                                                                                                                                                                PROGRESS NOTE     Patient Demographics:    Joe Coleman, is a 78 y.o. male, DOB - 17-Mar-1945, FMW:[id number]  Outpatient Primary MD for the patient is [facility]    LOS - 3  Admit date - 01/26/2024    No chief complaint on file.      Brief Narrative (HPI from H&P)     78 y.o. male with medical history significant of diabetes mellitus 2 on metformin , BPH on Flomax , CKD 3A, hypertension, GERD, COPD and history of DVT on Eliquis .  Patient was hospitalized back in September 2025 due to infected hardware in the right knee.  He underwent removal of hardware.  Wound cultures at that time positive for pansensitive Enterobacter and Klebsiella.  He was discharged home on linezolid  and doxycycline  as well as 25 days of IV ertapenem  to treat underlying osteomyelitis.  Readmitted to the hospital in November 2025 due to sepsis secondary to pneumonia.  He was discharged on Augmentin  as well as supportive care.    He states several days before he presented this time he followed up with his orthopedic team and follow-up surgical intervention was recommended.  He states he does have some memory issues and this may not be an accurate assessment.  He reports about 2 days ago he began having significant increase in pain and drainage and increased redness at the prior right lower extremity wound below the knee.  He had run out of his prior Percocet and was having difficulty managing his pain.  He presented to the ED where CT of the knee and lower extremity revealed a moderate to large thick-walled joint effusion as well as a comminuted proximal tibia fracture.  He was evaluated by Dr. Cristy with orthopedic team at [facility] and due to the complexity of his case it was recommended that he be transferred to [facility]    Subjective:  Joe Coleman denies any complaints, asking for ginger ale juice, reports knee pain is controlled, asking about the time his surgery tomorrow.    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  Cultures during that admission were positive for pansensitive Enterobacter and Klebsiella.   - Antibiotics management per ID  - Hepatic input greatly appreciated, status post arthrocentesis 12/23, with cell count of 69,000, plan for I&D of right knee by [name] tomorrow. - Will hold full dose Lovenox  after evening dose in anticipation for surgery tomorrow.      AKI on CKD IIIa baseline creatinine around 1.3, gentle hydration IV fluids, monitor bladder scans and BMP.   History of DVT on Eliquis , Eliquis  on hold and currently on full dose Lovenox  in anticipation.  Will hold over her evening dose as planned for surgery tomorrow.   BPH Continue Flomax    Hypertension placed on low-dose Coreg .   COPD  Continue as needed albuterol  neb, On Trelegy at home-therapeutic substitution with Breztri , stable.   GERD  Continue PPI   Severe protein calorie malnutrition Current BMI 19.49, add protein supplement  Hypophosphatemia - Replaced  Diabetes mellitus 2 - SSI.  Lab Results  Component Value Date   HGBA1C 6.0 (H) 01/25/2024   CBG (last 3)  Recent Labs    01/28/24 2058 01/29/24 0836 01/29/24 1129  GLUCAP 175* 128* 204*       Condition - Extremely Guarded  Family Communication  : None at bedside  Code Status : Full code  Consults  : Orthopedics, ID  PUD Prophylaxis : PPI   Procedures  :      CT 01/24/24 - R Knee -   1. Knee arthroplasty in expected alignment. Comminuted proximal tibial fracture  extends to the lateral aspect of the tibial tray as well as the cement adjacent to the tibial stem. Tibial metaphyseal and tibial shaft component demonstrates persistent fracture lucency and no bony bridging, suspicious for delayed/nonunion. 2. Proximal fibular fracture has incomplete surrounding callus formation. 3. Moderate to large thick-walled joint effusion with synovial enhancement. This may be inflammatory or infectious, consider fluid sampling. 4. Generalized soft tissue edema. No focal fluid collection. No soft tissue gas.      Disposition Plan  :    Status is: Inpatient   DVT Prophylaxis  : Lovenox     Lab Results  Component Value Date   PLT 242 01/29/2024    Diet :  Diet Order             Diet NPO time specified  Diet effective midnight           Diet Carb Modified Room service appropriate? Yes  Diet effective now                    Inpatient Medications  Scheduled Meds:  (feeding supplement) PROSource Plus  30 mL Oral BID BM   budesonide -glycopyrrolate -formoterol   2 puff Inhalation BID   carvedilol   3.125 mg Oral BID WC   vitamin D3  1,000 Units Oral Daily   cyanocobalamin   1,000 mcg Subcutaneous Daily   Followed by   [name] ON 02/05/2024] cyanocobalamin   1,000 mcg Subcutaneous Q30 days   enoxaparin  (LOVENOX ) injection  60 mg Subcutaneous Q12H   feeding supplement (GLUCERNA SHAKE)  237 mL Oral TID BM   gabapentin   300 mg  Oral QHS   insulin  aspart  0-5 Units Subcutaneous QHS   insulin  aspart  0-9 Units Subcutaneous TID WC   multivitamin with minerals  1 tablet Oral Daily   pantoprazole   40 mg Oral Daily   phosphorus  500 mg Oral TID   polyethylene glycol  17 g Oral Daily   sodium chloride  flush  3 mL Intravenous Q12H   tamsulosin   0.4 mg Oral Daily   thiamine   100 mg Per Tube Daily   Vitamin D  (Ergocalciferol )  50,000 Units Oral Q7 days   Continuous Infusions:  DAPTOmycin  500 mg (01/29/24 1429)   ertapenem  1 g (01/29/24 1001)   PRN  Meds:.acetaminophen  **OR** [DISCONTINUED] acetaminophen , methocarbamol , morphine  injection, ondansetron  **OR** ondansetron  (ZOFRAN ) IV, oxyCODONE   Antibiotics  :    Anti-infectives (From admission, onward)    Start     Dose/Rate Route Frequency Ordered Stop   01/27/24 1800  DAPTOmycin  (CUBICIN ) IVPB 500 mg/59mL premix        8 mg/kg  59 kg 100 mL/hr over 30 Minutes Intravenous Daily 01/27/24 1036     01/27/24 1600  ertapenem  (INVANZ ) 1 g in sodium chloride  0.9 % 100 mL IVPB        1 g 200 mL/hr over 30 Minutes Intravenous Every 24 hours 01/27/24 1036     01/27/24 0915  ceFEPIme  (MAXIPIME ) 2 g in sodium chloride  0.9 % 100 mL IVPB  Status:  Discontinued        2 g 200 mL/hr over 30 Minutes Intravenous Every 12 hours 01/27/24 0825 01/27/24 1036   01/27/24 0500  vancomycin  (VANCOREADY) IVPB 750 mg/150 mL  Status:  Discontinued        750 mg 150 mL/hr over 60 Minutes Intravenous Every 24 hours 01/26/24 1616 01/27/24 1036   01/26/24 2200  cefTRIAXone  (ROCEPHIN ) 2 g in sodium chloride  0.9 % 100 mL IVPB  Status:  Discontinued        2 g 200 mL/hr over 30 Minutes Intravenous Daily at bedtime 01/26/24 1616 01/27/24 0825   01/26/24 1700  metroNIDAZOLE  (FLAGYL ) IVPB 500 mg  Status:  Discontinued        500 mg 100 mL/hr over 60 Minutes Intravenous Every 12 hours 01/26/24 1605 01/27/24 1036         Objective:   Vitals:   01/29/24 0336 01/29/24 0806 01/29/24 0836 01/29/24 1200  BP: 130/64  (!) 121/58 (!) 112/54  Pulse: 87 84 92 87  Resp: 18 18 19 19   Temp: 97.9 F (36.6 C)  98 F (36.7 C) 98.2 F (36.8 C)  TempSrc: Oral  Oral Oral  SpO2: 99%  98% 100%  Weight:      Height:        Wt Readings from Last 3 Encounters:  01/28/24 65.4 kg  01/24/24 59 kg  12/20/23 59 kg     Intake/Output Summary (Last 24 hours) at 01/29/2024 1508 Last data filed at 01/29/2024 0300 Gross per 24 hour  Intake 3 ml  Output 1000 ml  Net -997 ml     Physical Exam  Awake Alert, Oriented X 3,  frail, chronically ill-appearing Good air entry by laterally Regular rate and rhythm +ve B.Sounds, Abd Soft, No tenderness, No rebound - guarding or rigidity.   R Knee on 01/26/2024       Data Review:    Recent Labs  Lab 01/24/24 1510 01/26/24 0500 01/27/24 0320 01/28/24 0150 01/29/24 0227  WBC 17.5* 16.3* 12.4* 11.0* 8.8  HGB 9.8* 9.0* 8.4*  8.1* 8.0*  HCT 32.4* 29.5* 27.7* 26.1* 25.9*  PLT 341 206 227 244 242  MCV 91.8 90.5 90.8 88.2 88.1  MCH 27.8 27.6 27.5 27.4 27.2  MCHC 30.2 30.5 30.3 31.0 30.9  RDW 15.5 14.8 14.8 14.4 14.5  LYMPHSABS 1.0  --   --  0.8 0.9  MONOABS 1.7*  --   --  1.2* 1.0  EOSABS 0.1  --   --  0.1 0.2  BASOSABS 0.1  --   --  0.0 0.0    Recent Labs  Lab  0000 01/24/24 1508 01/24/24 1510 01/24/24 1611 01/24/24 1837 01/24/24 2220 01/25/24 1528 01/25/24 1825 01/26/24 0500 01/27/24 0320 01/27/24 0542 01/27/24 1309 01/28/24 0150 01/29/24 0227  NA   < >  --  135  --  132*   < > 134*  --  135 133*  --   --  133* 135  K  --   --  6.4*   < > 6.6*   < > 4.7  --  4.2 4.4  --   --  4.0 3.9  CL   < >  --  104  --  100   < > 101  --  106 102  --   --  103 103  CO2   < >  --  23  --  19*   < > 23  --  21* 19*  --   --  18* 24  ANIONGAP   < >  --  8  --  13   < > 10  --  9 13  --   --  13 8  GLUCOSE   < >  --  171*  --  229*   < > 156*  --  149* 128*  --   --  132* 171*  BUN   < >  --  18  --  18   < > 20  --  21 24*  --   --  23 20  CREATININE   < >  --  1.31*  --  1.38*   < > 1.39*  --  1.37* 1.54*  --   --  1.54* 1.40*  AST  --   --  20  --   --   --   --   --   --   --   --   --   --   --   ALT  --   --  6  --   --   --   --   --   --   --   --   --   --   --   ALKPHOS  --   --  174*  --   --   --   --   --   --   --   --   --   --   --   BILITOT  --   --  0.5  --   --   --   --   --   --   --   --   --   --   --   ALBUMIN   --   --  3.8  --   --   --   --   --   --   --   --   --   --   --   CRP  --   --   --   --  9.1*  --   --   --   --   --   30.3*  --  25.9* 18.6*  PROCALCITON  --   --   --   --  0.52  --   --   --   --   --  1.38  --  0.96 0.55  LATICACIDVEN  --  1.2  --   --  1.6  --   --   --   --   --   --   --   --   --   INR  --   --  1.0  --   --   --   --   --   --   --   --   --   --   --   HGBA1C  --   --   --   --   --   --   --  6.0*  --   --   --   --   --   --   MG  --   --   --   --   --   --   --   --   --   --   --  1.9 1.9 2.0  PHOS  --   --   --   --   --   --   --   --   --   --   --  3.0 2.4* 2.2*  CALCIUM    < >  --  8.9  --  8.4*   < > 8.4*  --  7.7* 8.4*  --   --  8.6* 8.4*   < > = values in this interval not displayed.      Recent Labs  Lab 01/24/24 1508 01/24/24 1510 01/24/24 1510 01/24/24 1837 01/24/24 2220 01/25/24 1528 01/25/24 1825 01/26/24 0500 01/27/24 0320 01/27/24 0542 01/27/24 1309 01/28/24 0150 01/29/24 0227  CRP  --   --   --  9.1*  --   --   --   --   --  30.3*  --  25.9* 18.6*  PROCALCITON  --   --   --  0.52  --   --   --   --   --  1.38  --  0.96 0.55  LATICACIDVEN 1.2  --   --  1.6  --   --   --   --   --   --   --   --   --   INR  --  1.0  --   --   --   --   --   --   --   --   --   --   --   HGBA1C  --   --   --   --   --   --  6.0*  --   --   --   --   --   --   MG  --   --   --   --   --   --   --   --   --   --  1.9 1.9 2.0  CALCIUM   --  8.9   < > 8.4*   < > 8.4*  --  7.7* 8.4*  --   --  8.6* 8.4*   < > = values in this interval not displayed.    --------------------------------------------------------------------------------------------------------------- No results found  for: CHOL, HDL, LDLCALC, LDLDIRECT, TRIG, CHOLHDL  Lab Results  Component Value Date   HGBA1C 6.0 (H) 01/25/2024   No results for input(s): TSH, T4TOTAL, FREET4, T3FREE, THYROIDAB in the last 72 hours. No results for input(s): VITAMINB12, FOLATE, FERRITIN, TIBC, IRON , RETICCTPCT in the last 72  hours. ------------------------------------------------------------------------------------------------------------------ Cardiac Enzymes No results for input(s): CKMB, TROPONINI, MYOGLOBIN in the last 168 hours.  Invalid input(s): CK  Micro Results Recent Results (from the past 240 hours)  Resp panel by RT-PCR (RSV, Flu A&B, Covid) Anterior Nasal Swab     Status: None   Collection Time: 01/24/24  3:08 PM   Specimen: Anterior Nasal Swab  Result Value Ref Range Status   SARS Coronavirus 2 by RT PCR NEGATIVE NEGATIVE Final    Comment: (NOTE) SARS-CoV-2 target nucleic acids are NOT DETECTED.  The SARS-CoV-2 RNA is generally detectable in upper respiratory specimens during the acute phase of infection. The lowest concentration of SARS-CoV-2 viral copies this assay can detect is 138 copies/mL. A negative result does not preclude SARS-Cov-2 infection and should not be used as the sole basis for treatment or other patient management decisions. A negative result may occur with  improper specimen collection/handling, submission of specimen other than nasopharyngeal swab, presence of viral mutation(s) within the areas targeted by this assay, and inadequate number of viral copies(<138 copies/mL). A negative result must be combined with clinical observations, patient history, and epidemiological information. The expected result is Negative.  Fact Sheet for Patients:  bloggercourse.com  Fact Sheet for Healthcare Providers:  seriousbroker.it  This test is no t yet approved or cleared by the [place]  FDA and  has been authorized for detection and/or diagnosis of SARS-CoV-2 by FDA under an Emergency Use Authorization (EUA). This EUA will remain  in effect (meaning this test can be used) for the duration of the COVID-19 declaration under Section 564(b)(1) of the Act, 21 U.S.C.section 360bbb-3(b)(1), unless the authorization is  terminated  or revoked sooner.       Influenza A by PCR NEGATIVE NEGATIVE Final   Influenza B by PCR NEGATIVE NEGATIVE Final    Comment: (NOTE) The Xpert Xpress SARS-CoV-2/FLU/RSV plus assay is intended as an aid in the diagnosis of influenza from Nasopharyngeal swab specimens and should not be used as a sole basis for treatment. Nasal washings and aspirates are unacceptable for Xpert Xpress SARS-CoV-2/FLU/RSV testing.  Fact Sheet for Patients: bloggercourse.com  Fact Sheet for Healthcare Providers: seriousbroker.it  This test is not yet approved or cleared by the [place]  FDA and has been authorized for detection and/or diagnosis of SARS-CoV-2 by FDA under an Emergency Use Authorization (EUA). This EUA will remain in effect (meaning this test can be used) for the duration of the COVID-19 declaration under Section 564(b)(1) of the Act, 21 U.S.C. section 360bbb-3(b)(1), unless the authorization is terminated or revoked.     Resp Syncytial Virus by PCR NEGATIVE NEGATIVE Final    Comment: (NOTE) Fact Sheet for Patients: bloggercourse.com  Fact Sheet for Healthcare Providers: seriousbroker.it  This test is not yet approved or cleared by the [place]  FDA and has been authorized for detection and/or diagnosis of SARS-CoV-2 by FDA under an Emergency Use Authorization (EUA). This EUA will remain in effect (meaning this test can be used) for the duration of the COVID-19 declaration under Section 564(b)(1) of the Act, 21 U.S.C. section 360bbb-3(b)(1), unless the authorization is terminated or revoked.  Performed at [facility], [address], [place], [place] [address]  Blood Culture (routine x 2)     Status: None   Collection Time: 01/24/24  3:20 PM   Specimen: BLOOD  Result Value Ref Range Status   Specimen Description BLOOD BLOOD LEFT HAND  Final    Special Requests   Final    Blood Culture results may not be optimal due to an inadequate volume of blood received in culture bottles   Culture   Final    NO GROWTH 5 DAYS Performed at [facility], [address], [place], [place] [address]    Report Status 01/29/2024 FINAL  Final  Blood Culture (routine x 2)     Status: None   Collection Time: 01/24/24  3:20 PM   Specimen: BLOOD  Result Value Ref Range Status   Specimen Description BLOOD BLOOD RIGHT ARM  Final   Special Requests   Final    Blood Culture results may not be optimal due to an inadequate volume of blood received in culture bottles   Culture   Final    NO GROWTH 5 DAYS Performed at [facility], [address] [place], [place] [address]    Report Status 01/29/2024 FINAL  Final  MRSA Next Gen by PCR, Nasal     Status: Abnormal   Collection Time: 01/27/24  6:02 AM   Specimen: Nasal Mucosa; Nasal Swab  Result Value Ref Range Status   MRSA by PCR Next Gen DETECTED (A) NOT DETECTED Final    Comment: RESULT CALLED TO, READ BACK BY AND VERIFIED WITH: RN JESSICA B [id number] FCP (NOTE) The GeneXpert MRSA Assay (FDA approved for NASAL specimens only), is one component of a comprehensive MRSA colonization surveillance program. It is not intended to diagnose MRSA infection nor to guide or monitor treatment for MRSA infections. Test performance is not FDA approved in patients less than 23 years old. Performed at [facility], [address], [place], [place] [address]   Body fluid culture w Gram Stain     Status: None   Collection Time: 01/27/24 10:18 AM   Specimen: Synovium; Body Fluid  Result Value Ref Range Status   Specimen Description SYNOVIAL  Final   Special Requests Normal  Final   Gram Stain   Final    ABUNDANT WBC PRESENT, PREDOMINANTLY PMN NO ORGANISMS SEEN    Culture   Final    RARE STAPHYLOCOCCUS AUREUS CRITICAL RESULT CALLED TO, READ BACK BY AND VERIFIED WITH: RN MARLA GRAYSON [id number]  [id number] FCP Performed at [facility], [address] [place], [place] [address]    Report Status 01/29/2024 FINAL  Final   Organism ID, Bacteria STAPHYLOCOCCUS AUREUS  Final      Susceptibility   Staphylococcus aureus - MIC*    CIPROFLOXACIN  >=8 RESISTANT Resistant     ERYTHROMYCIN >=8 RESISTANT Resistant     GENTAMICIN <=0.5 SENSITIVE Sensitive     OXACILLIN 0.5 SENSITIVE Sensitive     TETRACYCLINE >=16 RESISTANT Resistant     VANCOMYCIN  <=0.5 SENSITIVE Sensitive     TRIMETH/SULFA <=10 SENSITIVE Sensitive     CLINDAMYCIN >=8 RESISTANT Resistant     RIFAMPIN <=0.5 SENSITIVE Sensitive     Inducible Clindamycin NEGATIVE Sensitive     LINEZOLID  2 SENSITIVE Sensitive     * RARE STAPHYLOCOCCUS AUREUS    Radiology Report No results found.   Signature  -   Brayton Lye M.D on 01/29/2024 at 3:08 PM   -  To page go to [email or web address]   "

## 2024-01-30 ENCOUNTER — Encounter (HOSPITAL_COMMUNITY)
Admission: AD | Disposition: A | Discharge status: Skilled Nursing Facility | Payer: Self-pay | Source: Other Acute Inpatient Hospital | Attending: Internal Medicine

## 2024-01-30 ENCOUNTER — Inpatient Hospital Stay (HOSPITAL_COMMUNITY): Admitting: Anesthesiology

## 2024-01-30 ENCOUNTER — Encounter (HOSPITAL_COMMUNITY): Payer: Self-pay | Admitting: Internal Medicine

## 2024-01-30 DIAGNOSIS — I129 Hypertensive chronic kidney disease with stage 1 through stage 4 chronic kidney disease, or unspecified chronic kidney disease: Secondary | ICD-10-CM | POA: Diagnosis not present

## 2024-01-30 DIAGNOSIS — Z87891 Personal history of nicotine dependence: Secondary | ICD-10-CM

## 2024-01-30 DIAGNOSIS — E1122 Type 2 diabetes mellitus with diabetic chronic kidney disease: Secondary | ICD-10-CM | POA: Diagnosis not present

## 2024-01-30 DIAGNOSIS — N1832 Chronic kidney disease, stage 3b: Secondary | ICD-10-CM | POA: Diagnosis not present

## 2024-01-30 DIAGNOSIS — M009 Pyogenic arthritis, unspecified: Secondary | ICD-10-CM | POA: Diagnosis not present

## 2024-01-30 HISTORY — PX: IRRIGATION AND DEBRIDEMENT KNEE: SHX5185

## 2024-01-30 LAB — CBC WITH DIFFERENTIAL/PLATELET
Abs Immature Granulocytes: 0.08 K/uL — ABNORMAL HIGH (ref 0.00–0.07)
Basophils Absolute: 0 K/uL (ref 0.0–0.1)
Basophils Relative: 1 %
Eosinophils Absolute: 0.2 K/uL (ref 0.0–0.5)
Eosinophils Relative: 2 %
HCT: 25.1 % — ABNORMAL LOW (ref 39.0–52.0)
Hemoglobin: 7.6 g/dL — ABNORMAL LOW (ref 13.0–17.0)
Immature Granulocytes: 1 %
Lymphocytes Relative: 14 %
Lymphs Abs: 1.1 K/uL (ref 0.7–4.0)
MCH: 27 pg (ref 26.0–34.0)
MCHC: 30.3 g/dL (ref 30.0–36.0)
MCV: 89 fL (ref 80.0–100.0)
Monocytes Absolute: 0.9 K/uL (ref 0.1–1.0)
Monocytes Relative: 12 %
Neutro Abs: 5.6 K/uL (ref 1.7–7.7)
Neutrophils Relative %: 70 %
Platelets: 258 K/uL (ref 150–400)
RBC: 2.82 MIL/uL — ABNORMAL LOW (ref 4.22–5.81)
RDW: 14.6 % (ref 11.5–15.5)
WBC: 7.9 K/uL (ref 4.0–10.5)
nRBC: 0 % (ref 0.0–0.2)

## 2024-01-30 LAB — ZINC: Zinc: 32 ug/dL — ABNORMAL LOW (ref 44–115)

## 2024-01-30 LAB — TYPE AND SCREEN
ABO/RH(D): O POS
Antibody Screen: NEGATIVE

## 2024-01-30 LAB — BASIC METABOLIC PANEL WITH GFR
Anion gap: 7 (ref 5–15)
BUN: 22 mg/dL (ref 8–23)
CO2: 26 mmol/L (ref 22–32)
Calcium: 8.4 mg/dL — ABNORMAL LOW (ref 8.9–10.3)
Chloride: 105 mmol/L (ref 98–111)
Creatinine, Ser: 1.42 mg/dL — ABNORMAL HIGH (ref 0.61–1.24)
GFR, Estimated: 50 mL/min — ABNORMAL LOW
Glucose, Bld: 123 mg/dL — ABNORMAL HIGH (ref 70–99)
Potassium: 4.4 mmol/L (ref 3.5–5.1)
Sodium: 138 mmol/L (ref 135–145)

## 2024-01-30 LAB — PHOSPHORUS: Phosphorus: 3.3 mg/dL (ref 2.5–4.6)

## 2024-01-30 LAB — GLUCOSE, CAPILLARY
Glucose-Capillary: 132 mg/dL — ABNORMAL HIGH (ref 70–99)
Glucose-Capillary: 146 mg/dL — ABNORMAL HIGH (ref 70–99)
Glucose-Capillary: 149 mg/dL — ABNORMAL HIGH (ref 70–99)
Glucose-Capillary: 397 mg/dL — ABNORMAL HIGH (ref 70–99)
Glucose-Capillary: 376 mg/dL — ABNORMAL HIGH (ref 70–99)

## 2024-01-30 LAB — PREPARE RBC (CROSSMATCH)

## 2024-01-30 LAB — MAGNESIUM: Magnesium: 2 mg/dL (ref 1.7–2.4)

## 2024-01-30 LAB — CK: Total CK: 27 U/L — ABNORMAL LOW (ref 49–397)

## 2024-01-30 LAB — PROCALCITONIN: Procalcitonin: 0.35 ng/mL

## 2024-01-30 LAB — C-REACTIVE PROTEIN: CRP: 15.5 mg/dL — ABNORMAL HIGH

## 2024-01-30 SURGERY — IRRIGATION AND DEBRIDEMENT KNEE
Anesthesia: General | Site: Knee | Laterality: Right

## 2024-01-30 MED ORDER — FENTANYL CITRATE (PF) 100 MCG/2ML IJ SOLN
INTRAMUSCULAR | Status: AC
  Filled 2024-01-30: qty 2

## 2024-01-30 MED ORDER — TOBRAMYCIN SULFATE 1.2 G IJ SOLR
INTRAMUSCULAR | Status: DC | PRN
  Administered 2024-01-30: 1.2 g via TOPICAL

## 2024-01-30 MED ORDER — PROPOFOL 10 MG/ML IV BOLUS
INTRAVENOUS | Status: DC | PRN
  Administered 2024-01-30: 70 mg via INTRAVENOUS

## 2024-01-30 MED ORDER — LACTATED RINGERS IV SOLN: INTRAVENOUS | Status: DC

## 2024-01-30 MED ORDER — MELATONIN 3 MG PO TABS: 3.0000 mg | ORAL_TABLET | Freq: Every day | ORAL | Status: DC

## 2024-01-30 MED ORDER — POLYETHYLENE GLYCOL 3350 17 G PO PACK: 17.0000 g | PACK | Freq: Every day | ORAL | Status: DC | PRN

## 2024-01-30 MED ORDER — ROCURONIUM BROMIDE 10 MG/ML (PF) SYRINGE
PREFILLED_SYRINGE | INTRAVENOUS | Status: DC | PRN
  Administered 2024-01-30: 40 mg via INTRAVENOUS

## 2024-01-30 MED ORDER — FENTANYL CITRATE (PF) 250 MCG/5ML IJ SOLN
INTRAMUSCULAR | Status: DC | PRN
  Administered 2024-01-30: 50 ug via INTRAVENOUS

## 2024-01-30 MED ORDER — TOBRAMYCIN SULFATE 1.2 G IJ SOLR
INTRAMUSCULAR | Status: AC
  Filled 2024-01-30: qty 1.2

## 2024-01-30 MED ORDER — FENTANYL CITRATE (PF) 100 MCG/2ML IJ SOLN
25.0000 ug | INTRAMUSCULAR | Status: DC | PRN
  Administered 2024-01-30: 25 ug via INTRAVENOUS

## 2024-01-30 MED ORDER — ACETAMINOPHEN 500 MG PO TABS: 1000.0000 mg | ORAL_TABLET | Freq: Once | ORAL | Status: DC

## 2024-01-30 MED ORDER — CHLORHEXIDINE GLUCONATE 0.12 % MT SOLN
OROMUCOSAL | Status: AC
  Administered 2024-01-30: 15 mL via OROMUCOSAL
  Filled 2024-01-30: qty 15

## 2024-01-30 MED ORDER — VANCOMYCIN HCL 1000 MG IV SOLR
INTRAVENOUS | Status: DC | PRN
  Administered 2024-01-30: 1000 mg via TOPICAL

## 2024-01-30 MED ORDER — MUPIROCIN 2 % EX OINT: 1.0000 | TOPICAL_OINTMENT | Freq: Two times a day (BID) | CUTANEOUS | 0 refills | Status: AC

## 2024-01-30 MED ORDER — CHLORHEXIDINE GLUCONATE 4 % EX SOLN: 1.0000 | CUTANEOUS | 1 refills | Status: AC

## 2024-01-30 MED ORDER — OXYCODONE HCL 5 MG PO TABS: 5.0000 mg | ORAL_TABLET | Freq: Once | ORAL | Status: DC | PRN

## 2024-01-30 MED ORDER — DROPERIDOL 2.5 MG/ML IJ SOLN: 0.6250 mg | Freq: Once | INTRAMUSCULAR | Status: DC | PRN

## 2024-01-30 MED ORDER — CEFAZOLIN SODIUM-DEXTROSE 2-4 GM/100ML-% IV SOLN
2.0000 g | Freq: Three times a day (TID) | INTRAVENOUS | Status: DC
  Administered 2024-01-30 – 2024-02-03 (×13): 2 g via INTRAVENOUS
  Filled 2024-01-30 (×8): qty 100

## 2024-01-30 MED ORDER — VANCOMYCIN HCL 1000 MG IV SOLR
INTRAVENOUS | Status: AC
  Filled 2024-01-30: qty 20

## 2024-01-30 MED ORDER — SODIUM CHLORIDE 0.9% IV SOLUTION: Freq: Once | INTRAVENOUS | Status: AC

## 2024-01-30 MED ORDER — ONDANSETRON HCL 4 MG/2ML IJ SOLN
INTRAMUSCULAR | Status: DC | PRN
  Administered 2024-01-30: 4 mg via INTRAVENOUS

## 2024-01-30 MED ORDER — CHLORHEXIDINE GLUCONATE 0.12 % MT SOLN: 15.0000 mL | Freq: Once | OROMUCOSAL | Status: AC

## 2024-01-30 MED ORDER — LACTATED RINGERS IV SOLN: INTRAVENOUS | Status: DC | PRN

## 2024-01-30 MED ORDER — 0.9 % SODIUM CHLORIDE (POUR BTL) OPTIME
TOPICAL | Status: DC | PRN
  Administered 2024-01-30: 1000 mL

## 2024-01-30 MED ORDER — LIDOCAINE 2% (20 MG/ML) 5 ML SYRINGE
INTRAMUSCULAR | Status: DC | PRN
  Administered 2024-01-30: 60 mg via INTRAVENOUS

## 2024-01-30 MED ORDER — APIXABAN 5 MG PO TABS
5.0000 mg | ORAL_TABLET | Freq: Two times a day (BID) | ORAL | Status: DC
  Administered 2024-01-30 – 2024-02-03 (×8): 5 mg via ORAL
  Filled 2024-01-30 (×5): qty 1

## 2024-01-30 MED ORDER — PHENYLEPHRINE 80 MCG/ML (10ML) SYRINGE FOR IV PUSH (FOR BLOOD PRESSURE SUPPORT)
PREFILLED_SYRINGE | INTRAVENOUS | Status: DC | PRN
  Administered 2024-01-30 (×3): 80 ug via INTRAVENOUS

## 2024-01-30 MED ORDER — METOCLOPRAMIDE HCL 10 MG PO TABS: 5.0000 mg | ORAL_TABLET | Freq: Three times a day (TID) | ORAL | Status: DC | PRN

## 2024-01-30 MED ORDER — DOCUSATE SODIUM 100 MG PO CAPS
100.0000 mg | ORAL_CAPSULE | Freq: Two times a day (BID) | ORAL | Status: DC
  Administered 2024-01-30 – 2024-02-03 (×7): 100 mg via ORAL
  Filled 2024-01-30 (×5): qty 1

## 2024-01-30 MED ORDER — OXYCODONE HCL 5 MG/5ML PO SOLN: 5.0000 mg | Freq: Once | ORAL | Status: DC | PRN

## 2024-01-30 MED ORDER — METOCLOPRAMIDE HCL 5 MG/ML IJ SOLN: 5.0000 mg | Freq: Three times a day (TID) | INTRAMUSCULAR | Status: DC | PRN

## 2024-01-30 MED ORDER — MELATONIN 3 MG PO TABS
3.0000 mg | ORAL_TABLET | Freq: Every evening | ORAL | Status: DC | PRN
  Administered 2024-01-30 – 2024-02-02 (×4): 3 mg via ORAL
  Filled 2024-01-30 (×3): qty 1

## 2024-01-30 MED ORDER — DEXAMETHASONE SOD PHOSPHATE PF 10 MG/ML IJ SOLN
INTRAMUSCULAR | Status: DC | PRN
  Administered 2024-01-30: 10 mg via INTRAVENOUS

## 2024-01-30 MED ORDER — SUGAMMADEX SODIUM 200 MG/2ML IV SOLN
INTRAVENOUS | Status: DC | PRN
  Administered 2024-01-30: 200 mg via INTRAVENOUS

## 2024-01-30 MED ORDER — ORAL CARE MOUTH RINSE: 15.0000 mL | Freq: Once | OROMUCOSAL | Status: AC

## 2024-01-30 SURGICAL SUPPLY — 42 items
BAG COUNTER SPONGE SURGICOUNT (BAG) ×1 IMPLANT
BNDG COHESIVE 4X5 TAN STRL LF (GAUZE/BANDAGES/DRESSINGS) ×1 IMPLANT
BNDG ELASTIC 6INX 5YD STR LF (GAUZE/BANDAGES/DRESSINGS) IMPLANT
BNDG GAUZE DERMACEA FLUFF 4 (GAUZE/BANDAGES/DRESSINGS) ×2 IMPLANT
BRUSH SCRUB EZ PLAIN DRY (MISCELLANEOUS) ×2 IMPLANT
CHLORAPREP W/TINT 26 (MISCELLANEOUS) ×1 IMPLANT
COVER MAYO STAND STRL (DRAPES) ×1 IMPLANT
COVER SURGICAL LIGHT HANDLE (MISCELLANEOUS) ×2 IMPLANT
DRAPE SURG 17X23 STRL (DRAPES) ×1 IMPLANT
DRAPE SURG ORHT 6 SPLT 77X108 (DRAPES) ×1 IMPLANT
DRAPE U-SHAPE 47X51 STRL (DRAPES) ×1 IMPLANT
DRSG ADAPTIC 3X8 NADH LF (GAUZE/BANDAGES/DRESSINGS) ×1 IMPLANT
DRSG MEPITEL 4X7.2 (GAUZE/BANDAGES/DRESSINGS) IMPLANT
ELECTRODE REM PT RTRN 9FT ADLT (ELECTROSURGICAL) IMPLANT
EVACUATOR 1/8 PVC DRAIN (DRAIN) IMPLANT
GAUZE PAD ABD 8X10 STRL (GAUZE/BANDAGES/DRESSINGS) IMPLANT
GAUZE SPONGE 4X4 12PLY STRL (GAUZE/BANDAGES/DRESSINGS) ×1 IMPLANT
GLOVE BIO SURGEON STRL SZ 6.5 (GLOVE) ×3 IMPLANT
GLOVE BIO SURGEON STRL SZ7.5 (GLOVE) ×4 IMPLANT
GLOVE BIOGEL PI IND STRL 6.5 (GLOVE) ×1 IMPLANT
GLOVE BIOGEL PI IND STRL 7.5 (GLOVE) ×1 IMPLANT
GOWN STRL REUS W/ TWL LRG LVL3 (GOWN DISPOSABLE) ×2 IMPLANT
KIT BASIN OR (CUSTOM PROCEDURE TRAY) ×1 IMPLANT
KIT TURNOVER KIT B (KITS) ×1 IMPLANT
MANIFOLD NEPTUNE II (INSTRUMENTS) ×1 IMPLANT
PACK ORTHO EXTREMITY (CUSTOM PROCEDURE TRAY) ×1 IMPLANT
PAD ARMBOARD POSITIONER FOAM (MISCELLANEOUS) ×2 IMPLANT
PADDING CAST COTTON 6X4 STRL (CAST SUPPLIES) ×1 IMPLANT
SET HNDPC FAN SPRY TIP SCT (DISPOSABLE) IMPLANT
SOLN 0.9% NACL POUR BTL 1000ML (IV SOLUTION) ×1 IMPLANT
SOLN STERILE WATER BTL 1000 ML (IV SOLUTION) ×1 IMPLANT
SPONGE T-LAP 18X18 ~~LOC~~+RFID (SPONGE) ×1 IMPLANT
SUT ETHILON 2 0 FS 18 (SUTURE) ×2 IMPLANT
SUT ETHILON 3 0 PS 1 (SUTURE) ×2 IMPLANT
SUT MON AB 2-0 CT1 36 (SUTURE) ×1 IMPLANT
SUT PDS AB 0 CT 36 (SUTURE) IMPLANT
SWAB CULTURE ESWAB REG 1ML (MISCELLANEOUS) IMPLANT
TOWEL GREEN STERILE (TOWEL DISPOSABLE) ×2 IMPLANT
TOWEL GREEN STERILE FF (TOWEL DISPOSABLE) ×1 IMPLANT
TUBE CONNECTING 12X1/4 (SUCTIONS) ×1 IMPLANT
UNDERPAD 30X36 HEAVY ABSORB (UNDERPADS AND DIAPERS) ×1 IMPLANT
YANKAUER SUCT BULB TIP NO VENT (SUCTIONS) ×1 IMPLANT

## 2024-01-30 NOTE — Interval H&P Note (Signed)
 History and Physical Interval Note:  01/30/2024 10:11 AM  Joe Coleman  has presented today for surgery, with the diagnosis of Right knee infection.  The various methods of treatment have been discussed with the patient and family. After consideration of risks, benefits and other options for treatment, the patient has consented to  Procedures: IRRIGATION AND DEBRIDEMENT KNEE (Right) as a surgical intervention.  The patient's history has been reviewed, patient examined, no change in status, stable for surgery.  I have reviewed the patient's chart and labs.  Questions were answered to the patient's satisfaction.     Veto Macqueen P Dayln Tug[place]

## 2024-01-30 NOTE — Anesthesia Procedure Notes (Signed)
 Procedure Name: Intubation Date/Time: 01/30/2024 10:54 AM  Performed by: Arvell Edsel HERO, CRNAPre-anesthesia Checklist: Emergency Drugs available, Suction available, Patient being monitored, Timeout performed and Patient identified Patient Re-evaluated:Patient Re-evaluated prior to induction Oxygen Delivery Method: Circle system utilized Preoxygenation: Pre-oxygenation with 100% oxygen Induction Type: IV induction Ventilation: Oral airway inserted - appropriate to patient size and Two handed mask ventilation required Laryngoscope Size: Mac and 3 Grade View: Grade II Tube type: Oral Tube size: 7.0 mm Number of attempts: 1 Airway Equipment and Method: Patient positioned with wedge pillow, Stylet and LMA fast track Placement Confirmation: ETT inserted through vocal cords under direct vision, positive ETCO2 and breath sounds checked- equal and bilateral Secured at: 22 cm Tube secured with: Tape Dental Injury: Teeth and Oropharynx as per pre-operative assessment

## 2024-01-30 NOTE — Progress Notes (Signed)
 PT Cancellation Note  Patient Details Name: Joe Coleman MRN: [id number] DOB: 04/30/1945   Cancelled Treatment:    Reason Eval/Treat Not Completed: (P) Patient at procedure or test/unavailable. Will plan to follow-up later if time permits.   Theo Ferretti, PT, DPT Acute Rehabilitation Services  Office: [phone]    Theo CHRISTELLA Ferretti 01/30/2024, 11:25 AM

## 2024-01-30 NOTE — Anesthesia Postprocedure Evaluation (Signed)
"   Anesthesia Post Note  Patient: Joe Coleman  Procedure(s) Performed: IRRIGATION AND DEBRIDEMENT KNEE (Right: Knee)     Patient location during evaluation: PACU Anesthesia Type: General Level of consciousness: sedated and patient cooperative Pain management: pain level controlled Vital Signs Assessment: post-procedure vital signs reviewed and stable Respiratory status: spontaneous breathing Cardiovascular status: stable Anesthetic complications: no   No notable events documented.  Last Vitals:  Vitals:   01/30/24 1215 01/30/24 1230  BP: (!) 133/58 134/65  Pulse: 73 73  Resp: 20 14  Temp:  36.5 C  SpO2: 94% 91%    Last Pain:  Vitals:   01/30/24 1230  TempSrc:   PainSc: 4                  Enisa Runyan      "

## 2024-01-30 NOTE — Progress Notes (Signed)
 "                                                                                                                                                                                                                                                                                PROGRESS NOTE     Patient Demographics:    Joe Coleman, is a 78 y.o. male, DOB - 04/04/1945, FMW:[id number]  Outpatient Primary MD for the patient is [facility]    LOS - 4  Admit date - 01/26/2024    No chief complaint on file.      Brief Narrative (HPI from H&P)     78 y.o. male with medical history significant of diabetes mellitus 2 on metformin , BPH on Flomax , CKD 3A, hypertension, GERD, COPD and history of DVT on Eliquis .  Patient was hospitalized back in September 2025 due to infected hardware in the right knee.  He underwent removal of hardware.  Wound cultures at that time positive for pansensitive Enterobacter and Klebsiella.  He was discharged home on linezolid  and doxycycline  as well as 25 days of IV ertapenem  to treat underlying osteomyelitis.  Readmitted to the hospital in November 2025 due to sepsis secondary to pneumonia.  He was discharged on Augmentin  as well as supportive care.    He states several days before he presented this time he followed up with his orthopedic team and follow-up surgical intervention was recommended.  He states he does have some memory issues and this may not be an accurate assessment.  He reports about 2 days ago he began having significant increase in pain and drainage and increased redness at the prior right lower extremity wound below the knee.  He had run out of his prior Percocet and was having difficulty managing his pain.  He presented to the ED where CT of the knee and lower extremity revealed a moderate to large thick-walled joint effusion as well as a comminuted proximal tibia fracture.  He was evaluated by Dr. Cristy with orthopedic team at [facility] and due to the complexity of his case it was recommended that he be transferred to [facility]    Subjective:  Patient was seen and examined this morning before his surgery.   Joe Coleman denies any complaints, but he is hungry and asking for something to eat or to drink, and I told him we have to wait till his surgery, he has been compliant with n.p.o.       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  Cultures during that admission were positive for pansensitive Enterobacter and Klebsiella.   - Antibiotics management per ID  - Hepatic input greatly appreciated, status post arthrocentesis 12/23, with cell count of 69,000, plan for I&D of right knee by [name], went for I&D today. -Postop recommendation by [name] Scull Op Plan/Instructions: Patient will be weightbearing as tolerated to the right lower extremity.  He will receive continued IV antibiotics.  Will have him mobilize with physical occupational therapy.  Will have him follow-up as an outpatient in 2 to 3 weeks for x-rays and suture removal. -Discussed with orthopedic if okay to resume him on Eliquis  this evening.     AKI on CKD IIIa baseline creatinine around 1.3, gentle hydration IV fluids, monitor bladder scans and BMP.   History of DVT on Eliquis ,  -Eliquis  on hold in anticipation for surgery, on full dose Lovenox , will discuss with Ortho is okay to resume back on Eliquis  this evening.  Anemia -Hemoglobin down to 7.6, will transfuse 1 unit in anticipation of surgery   BPH Continue Flomax    Hypertension placed on low-dose Coreg .   COPD  Continue as needed albuterol  neb, On Trelegy at home-therapeutic substitution with Breztri , stable.   GERD  Continue PPI   Severe protein calorie malnutrition Current BMI 19.49, add protein  supplement  Hypophosphatemia - Replaced  Diabetes mellitus 2 - SSI.  Lab Results  Component Value Date   HGBA1C 6.0 (H) 01/25/2024   CBG (last 3)  Recent Labs    01/30/24 0823 01/30/24 0953 01/30/24 1147  GLUCAP 132* 146* 149*       Condition - Extremely Guarded  Family Communication  : None at bedside  Code Status : Full code  Consults  : Orthopedics, ID  PUD Prophylaxis : PPI   Procedures  :      CT 01/24/24 - R Knee -   1. Knee arthroplasty in expected alignment. Comminuted proximal tibial fracture extends to the lateral aspect of the tibial tray as well as the cement adjacent to the tibial stem. Tibial metaphyseal and tibial shaft component demonstrates persistent fracture lucency and no bony bridging, suspicious for delayed/nonunion. 2. Proximal fibular fracture has incomplete surrounding callus formation. 3. Moderate to large thick-walled joint effusion with synovial enhancement. This may be inflammatory or infectious, consider fluid sampling. 4. Generalized soft tissue edema. No focal fluid collection. No soft tissue gas.      Disposition Plan  :    Status is: Inpatient   DVT Prophylaxis  : Lovenox     Lab Results  Component Value Date   PLT 258 01/30/2024    Diet :  Diet Order             Diet NPO time specified Except for: Sips with Meds  Diet effective midnight                    Inpatient Medications  Scheduled Meds:  [MAR Hold] (feeding supplement) PROSource Plus  30 mL Oral BID BM   acetaminophen   1,000 mg Oral Once   [MAR Hold] budesonide -glycopyrrolate -formoterol   2 puff Inhalation BID   [MAR Hold] carvedilol   3.125 mg Oral BID WC   [MAR Hold] vitamin D3  1,000 Units Oral Daily   [MAR Hold] cyanocobalamin   1,000 mcg Subcutaneous Daily   Followed by   [facility] Hold] cyanocobalamin   1,000 mcg Subcutaneous Q30 days   [MAR Hold] feeding supplement (GLUCERNA SHAKE)  237 mL Oral TID BM   [MAR Hold] gabapentin   300 mg Oral QHS   [MAR  Hold] insulin  aspart  0-5 Units Subcutaneous QHS   [MAR Hold] insulin  aspart  0-9 Units Subcutaneous TID WC   [MAR Hold] multivitamin with minerals  1 tablet Oral Daily   [MAR Hold] pantoprazole   40 mg Oral Daily   [MAR Hold] polyethylene glycol  17 g Oral Daily   [MAR Hold] sodium chloride  flush  3 mL Intravenous Q12H   [MAR Hold] tamsulosin   0.4 mg Oral Daily   [MAR Hold] thiamine   100 mg Per Tube Daily   [MAR Hold] Vitamin D  (Ergocalciferol )  50,000 Units Oral Q7 days   Continuous Infusions:  [MAR Hold] DAPTOmycin  500 mg (01/29/24 1429)   [MAR Hold] ertapenem  1 g (01/29/24 1001)   lactated ringers  10 mL/hr at 01/30/24 1014   PRN Meds:.[MAR Hold] acetaminophen  **OR** [DISCONTINUED] acetaminophen , droperidol , fentaNYL  (SUBLIMAZE ) injection, [MAR Hold] melatonin, [MAR Hold] methocarbamol , [MAR Hold]  morphine  injection, [MAR Hold] ondansetron  **OR** [MAR Hold] ondansetron  (ZOFRAN ) IV, [MAR Hold] oxyCODONE , oxyCODONE  **OR** oxyCODONE   Antibiotics  :    Anti-infectives (From admission, onward)    Start     Dose/Rate Route Frequency Ordered Stop   01/30/24 1129  tobramycin  (NEBCIN ) powder  Status:  Discontinued          As needed 01/30/24 1149 01/30/24 1149   01/30/24 1129  vancomycin  (VANCOCIN ) powder  Status:  Discontinued          As needed 01/30/24 1149 01/30/24 1149   01/27/24 1800  [MAR Hold]  DAPTOmycin  (CUBICIN ) IVPB 500 mg/30mL premix        (MAR Hold since Fri 01/30/2024 at 0939.Hold Reason: Transfer to a Procedural area)   8 mg/kg  59 kg 100 mL/hr over 30 Minutes Intravenous Daily 01/27/24 1036     01/27/24 1600  [MAR Hold]  ertapenem  (INVANZ ) 1 g in sodium chloride  0.9 % 100 mL IVPB        (MAR Hold since Fri 01/30/2024 at 0939.Hold Reason: Transfer to a Procedural area)   1 g 200 mL/hr over 30 Minutes Intravenous Every 24 hours 01/27/24 1036     01/27/24 0915  ceFEPIme  (MAXIPIME ) 2 g in sodium chloride  0.9 % 100 mL IVPB  Status:  Discontinued        2 g 200 mL/hr over 30  Minutes Intravenous Every 12 hours 01/27/24 0825 01/27/24 1036   01/27/24 0500  vancomycin  (VANCOREADY) IVPB 750 mg/150 mL  Status:  Discontinued        750 mg 150 mL/hr over 60 Minutes Intravenous Every 24 hours 01/26/24 1616 01/27/24 1036   01/26/24 2200  cefTRIAXone  (ROCEPHIN ) 2 g in sodium chloride  0.9 % 100 mL IVPB  Status:  Discontinued        2 g 200 mL/hr over 30 Minutes Intravenous Daily at bedtime 01/26/24 1616 01/27/24 0825   01/26/24 1700  metroNIDAZOLE  (FLAGYL ) IVPB 500 mg  Status:  Discontinued        500 mg  100 mL/hr over 60 Minutes Intravenous Every 12 hours 01/26/24 1605 01/27/24 1036         Objective:   Vitals:   01/30/24 0948 01/30/24 1145 01/30/24 1200 01/30/24 1215  BP: 131/62 (!) 157/80 (!) 141/62 (!) 133/58  Pulse: 74 77 75 73  Resp: (!) 21 17 17 20   Temp: 98.4 F (36.9 C) 98.6 F (37 C)    TempSrc: Oral     SpO2: 98% 100% 90% 94%  Weight: 61.2 kg     Height: 5' 8.5 (1.74 m)       Wt Readings from Last 3 Encounters:  01/30/24 61.2 kg  01/24/24 59 kg  12/20/23 59 kg     Intake/Output Summary (Last 24 hours) at 01/30/2024 1233 Last data filed at 01/30/2024 1146 Gross per 24 hour  Intake 503 ml  Output 1000 ml  Net -497 ml     Physical Exam  Awake Alert, Oriented X 3, frail, chronically ill-appearing Good air entry by laterally Regular rate and rhythm Abdomen soft Right lower extremity with mild edema, right knee with significant chronic changes       Data Review:    Recent Labs  Lab 01/24/24 1510 01/26/24 0500 01/27/24 0320 01/28/24 0150 01/29/24 0227 01/30/24 0532  WBC 17.5* 16.3* 12.4* 11.0* 8.8 7.9  HGB 9.8* 9.0* 8.4* 8.1* 8.0* 7.6*  HCT 32.4* 29.5* 27.7* 26.1* 25.9* 25.1*  PLT 341 206 227 244 242 258  MCV 91.8 90.5 90.8 88.2 88.1 89.0  MCH 27.8 27.6 27.5 27.4 27.2 27.0  MCHC 30.2 30.5 30.3 31.0 30.9 30.3  RDW 15.5 14.8 14.8 14.4 14.5 14.6  LYMPHSABS 1.0  --   --  0.8 0.9 1.1  MONOABS 1.7*  --   --  1.2* 1.0 0.9   EOSABS 0.1  --   --  0.1 0.2 0.2  BASOSABS 0.1  --   --  0.0 0.0 0.0    Recent Labs  Lab  0000 01/24/24 1508 01/24/24 1510 01/24/24 1611 01/24/24 1837 01/24/24 2220 01/25/24 1825 01/26/24 0500 01/27/24 0320 01/27/24 0542 01/27/24 1309 01/28/24 0150 01/29/24 0227 01/30/24 0532  NA   < >  --  135  --  132*   < >  --  135 133*  --   --  133* 135 138  K  --   --  6.4*   < > 6.6*   < >  --  4.2 4.4  --   --  4.0 3.9 4.4  CL   < >  --  104  --  100   < >  --  106 102  --   --  103 103 105  CO2   < >  --  23  --  19*   < >  --  21* 19*  --   --  18* 24 26  ANIONGAP   < >  --  8  --  13   < >  --  9 13  --   --  13 8 7   GLUCOSE   < >  --  171*  --  229*   < >  --  149* 128*  --   --  132* 171* 123*  BUN   < >  --  18  --  18   < >  --  21 24*  --   --  23 20 22   CREATININE   < >  --  1.31*  --  1.38*   < >  --  1.37* 1.54*  --   --  1.54* 1.40* 1.42*  AST  --   --  20  --   --   --   --   --   --   --   --   --   --   --   ALT  --   --  6  --   --   --   --   --   --   --   --   --   --   --   ALKPHOS  --   --  174*  --   --   --   --   --   --   --   --   --   --   --   BILITOT  --   --  0.5  --   --   --   --   --   --   --   --   --   --   --   ALBUMIN   --   --  3.8  --   --   --   --   --   --   --   --   --   --   --   CRP  --   --   --   --  9.1*  --   --   --   --  30.3*  --  25.9* 18.6* 15.5*  PROCALCITON  --   --   --   --  0.52  --   --   --   --  1.38  --  0.96 0.55 0.35  LATICACIDVEN  --  1.2  --   --  1.6  --   --   --   --   --   --   --   --   --   INR  --   --  1.0  --   --   --   --   --   --   --   --   --   --   --   HGBA1C  --   --   --   --   --   --  6.0*  --   --   --   --   --   --   --   MG  --   --   --   --   --   --   --   --   --   --  1.9 1.9 2.0 2.0  PHOS  --   --   --   --   --   --   --   --   --   --  3.0 2.4* 2.2* 3.3  CALCIUM    < >  --  8.9  --  8.4*   < >  --  7.7* 8.4*  --   --  8.6* 8.4* 8.4*   < > = values in this interval not displayed.       Recent Labs  Lab 01/24/24 1508 01/24/24 1510 01/24/24 1510 01/24/24 1837 01/24/24 2220 01/25/24 1825 01/26/24 0500 01/27/24 0320 01/27/24 0542 01/27/24 1309 01/28/24 0150 01/29/24 0227 01/30/24 0532  CRP  --   --   --  9.1*  --   --   --   --  30.3*  --  25.9* 18.6* 15.5*  PROCALCITON  --   --   --  0.52  --   --   --   --  1.38  --  0.96 0.55 0.35  LATICACIDVEN 1.2  --   --  1.6  --   --   --   --   --   --   --   --   --   INR  --  1.0  --   --   --   --   --   --   --   --   --   --   --   HGBA1C  --   --   --   --   --  6.0*  --   --   --   --   --   --   --   MG  --   --   --   --   --   --   --   --   --  1.9 1.9 2.0 2.0  CALCIUM   --  8.9   < > 8.4*   < >  --  7.7* 8.4*  --   --  8.6* 8.4* 8.4*   < > = values in this interval not displayed.    --------------------------------------------------------------------------------------------------------------- No results found for: CHOL, HDL, LDLCALC, LDLDIRECT, TRIG, CHOLHDL  Lab Results  Component Value Date   HGBA1C 6.0 (H) 01/25/2024   No results for input(s): TSH, T4TOTAL, FREET4, T3FREE, THYROIDAB in the last 72 hours. No results for input(s): VITAMINB12, FOLATE, FERRITIN, TIBC, IRON , RETICCTPCT in the last 72 hours. ------------------------------------------------------------------------------------------------------------------ Cardiac Enzymes No results for input(s): CKMB, TROPONINI, MYOGLOBIN in the last 168 hours.  Invalid input(s): CK  Micro Results Recent Results (from the past 240 hours)  Resp panel by RT-PCR (RSV, Flu A&B, Covid) Anterior Nasal Swab     Status: None   Collection Time: 01/24/24  3:08 PM   Specimen: Anterior Nasal Swab  Result Value Ref Range Status   SARS Coronavirus 2 by RT PCR NEGATIVE NEGATIVE Final    Comment: (NOTE) SARS-CoV-2 target nucleic acids are NOT DETECTED.  The SARS-CoV-2 RNA is generally detectable in upper  respiratory specimens during the acute phase of infection. The lowest concentration of SARS-CoV-2 viral copies this assay can detect is 138 copies/mL. A negative result does not preclude SARS-Cov-2 infection and should not be used as the sole basis for treatment or other patient management decisions. A negative result may occur with  improper specimen collection/handling, submission of specimen other than nasopharyngeal swab, presence of viral mutation(s) within the areas targeted by this assay, and inadequate number of viral copies(<138 copies/mL). A negative result must be combined with clinical observations, patient history, and epidemiological information. The expected result is Negative.  Fact Sheet for Patients:  bloggercourse.com  Fact Sheet for Healthcare Providers:  seriousbroker.it  This test is no t yet approved or cleared by the [place]  FDA and  has been authorized for detection and/or diagnosis of SARS-CoV-2 by FDA under an Emergency Use Authorization (EUA). This EUA will remain  in effect (meaning this test can be used) for the duration of the COVID-19 declaration under Section 564(b)(1) of the Act, 21 U.S.C.section 360bbb-3(b)(1), unless the authorization is terminated  or revoked sooner.       Influenza A by PCR NEGATIVE NEGATIVE Final   Influenza B by PCR NEGATIVE NEGATIVE Final    Comment: (NOTE) The Xpert Xpress SARS-CoV-2/FLU/RSV plus assay  is intended as an aid in the diagnosis of influenza from Nasopharyngeal swab specimens and should not be used as a sole basis for treatment. Nasal washings and aspirates are unacceptable for Xpert Xpress SARS-CoV-2/FLU/RSV testing.  Fact Sheet for Patients: bloggercourse.com  Fact Sheet for Healthcare Providers: seriousbroker.it  This test is not yet approved or cleared by the [place]  FDA and has been  authorized for detection and/or diagnosis of SARS-CoV-2 by FDA under an Emergency Use Authorization (EUA). This EUA will remain in effect (meaning this test can be used) for the duration of the COVID-19 declaration under Section 564(b)(1) of the Act, 21 U.S.C. section 360bbb-3(b)(1), unless the authorization is terminated or revoked.     Resp Syncytial Virus by PCR NEGATIVE NEGATIVE Final    Comment: (NOTE) Fact Sheet for Patients: bloggercourse.com  Fact Sheet for Healthcare Providers: seriousbroker.it  This test is not yet approved or cleared by the [place]  FDA and has been authorized for detection and/or diagnosis of SARS-CoV-2 by FDA under an Emergency Use Authorization (EUA). This EUA will remain in effect (meaning this test can be used) for the duration of the COVID-19 declaration under Section 564(b)(1) of the Act, 21 U.S.C. section 360bbb-3(b)(1), unless the authorization is terminated or revoked.  Performed at [facility], [address] [place], [place] [address]   Blood Culture (routine x 2)     Status: None   Collection Time: 01/24/24  3:20 PM   Specimen: BLOOD  Result Value Ref Range Status   Specimen Description BLOOD BLOOD LEFT HAND  Final   Special Requests   Final    Blood Culture results may not be optimal due to an inadequate volume of blood received in culture bottles   Culture   Final    NO GROWTH 5 DAYS Performed at [facility], [address], [place], [place] [address]    Report Status 01/29/2024 FINAL  Final  Blood Culture (routine x 2)     Status: None   Collection Time: 01/24/24  3:20 PM   Specimen: BLOOD  Result Value Ref Range Status   Specimen Description BLOOD BLOOD RIGHT ARM  Final   Special Requests   Final    Blood Culture results may not be optimal due to an inadequate volume of blood received in culture bottles   Culture   Final    NO GROWTH 5  DAYS Performed at [facility], [address] [place], [place] [address]    Report Status 01/29/2024 FINAL  Final  MRSA Next Gen by PCR, Nasal     Status: Abnormal   Collection Time: 01/27/24  6:02 AM   Specimen: Nasal Mucosa; Nasal Swab  Result Value Ref Range Status   MRSA by PCR Next Gen DETECTED (A) NOT DETECTED Final    Comment: RESULT CALLED TO, READ BACK BY AND VERIFIED WITH: RN JESSICA B 1103 [id number] FCP (NOTE) The GeneXpert MRSA Assay (FDA approved for NASAL specimens only), is one component of a comprehensive MRSA colonization surveillance program. It is not intended to diagnose MRSA infection nor to guide or monitor treatment for MRSA infections. Test performance is not FDA approved in patients less than 58 years old. Performed at [facility], [address], [place], [place] [address]   Body fluid culture w Gram Stain     Status: None   Collection Time: 01/27/24 10:18 AM   Specimen: Synovium; Body Fluid  Result Value Ref Range Status   Specimen Description  SYNOVIAL  Final   Special Requests Normal  Final   Gram Stain   Final    ABUNDANT WBC PRESENT, PREDOMINANTLY PMN NO ORGANISMS SEEN    Culture   Final    RARE STAPHYLOCOCCUS AUREUS CRITICAL RESULT CALLED TO, READ BACK BY AND VERIFIED WITH: RN MARLA GRAYSON [phone] FCP Performed at [facility], [address], [place], [place] [address]    Report Status 01/29/2024 FINAL  Final   Organism ID, Bacteria STAPHYLOCOCCUS AUREUS  Final      Susceptibility   Staphylococcus aureus - MIC*    CIPROFLOXACIN  >=8 RESISTANT Resistant     ERYTHROMYCIN >=8 RESISTANT Resistant     GENTAMICIN <=0.5 SENSITIVE Sensitive     OXACILLIN 0.5 SENSITIVE Sensitive     TETRACYCLINE >=16 RESISTANT Resistant     VANCOMYCIN  <=0.5 SENSITIVE Sensitive     TRIMETH/SULFA <=10 SENSITIVE Sensitive     CLINDAMYCIN >=8 RESISTANT Resistant     RIFAMPIN <=0.5 SENSITIVE Sensitive     Inducible Clindamycin NEGATIVE Sensitive      LINEZOLID  2 SENSITIVE Sensitive     * RARE STAPHYLOCOCCUS AUREUS    Radiology Report No results found.   Signature  -   Brayton Lye M.D on 01/30/2024 at 12:33 PM   -  To page go to [email or web address]   "

## 2024-01-30 NOTE — Transfer of Care (Signed)
 Immediate Anesthesia Transfer of Care Note  Patient: Joe Coleman  Procedure(s) Performed: IRRIGATION AND DEBRIDEMENT KNEE (Right: Knee)  Patient Location: PACU  Anesthesia Type:General  Level of Consciousness: awake, alert , and patient cooperative  Airway & Oxygen Therapy: Patient Spontanous Breathing and Patient connected to face mask oxygen  Post-op Assessment: Report given to RN, Post -op Vital signs reviewed and stable, Patient moving all extremities, and Patient moving all extremities X 4  Post vital signs: Reviewed and stable  Last Vitals:  Vitals Value Taken Time  BP 157/80 01/30/24 11:45  Temp    Pulse 74 01/30/24  11:45  Resp 12 01/30/24 11:47  SpO2 100 01/30/24  11:47  Vitals shown include unfiled device data.  Last Pain:  Vitals:   01/30/24 1010  TempSrc:   PainSc: 5       Patients Stated Pain Goal: 0 (01/28/24 1100)  Complications: No notable events documented.

## 2024-01-30 NOTE — Progress Notes (Signed)
 Ortho Progress Note  Patient seen and evaluated this morning.  I reviewed his lab work and studies.  I appreciate the hospitalist and infectious disease service with their assistance with his management.  He has a complex problem.  He underwent open reduction internal fixation of a comminuted complex proximal tibia fracture.  He did have a soft tissue injury associated with this.  He eventually had wound breakdown and infection requiring hardware removal in September.  We have been trying to treat him with antibiotics and suppression.  That cultures grew out Klebsiella and Enterobacter.  He returned in sepsis with drainage from his wound as well as a significant knee effusion.  We aspirated his knee on 1223 which showed 69,000 cells and a arthroplasty.  The cultures are growing out staph which is a different bacteria than the previous 2 that were identified on his postop infection.  Unfortunately I discussed his case with one of my arthroplasty colleagues and he felt that a explant and two-stage would not be appropriate due to his lack of union of his tibia.  As a result the 2 options are to perform an I&D and suppress his infection and his arthroplasty until he can heal his fracture and then perform a two-stage explant and revision surgery.  The other option is to perform an above-knee amputation.  I discussed both of these options with the patient today.  He would like to save his leg at all costs at this time.  We will plan to proceed with I&D today.  If he gets reinfected or we are not able to clear his infection enough to get the tibia fracture to heal we may have to pursue the above-knee amputation.  I discussed this over the phone with his niece as well.  They are in agreement.  Will plan to take cultures intraoperatively today.  Franky MYRTIS Light, MD [facility] [phone] (office) [email or web address]

## 2024-01-30 NOTE — Plan of Care (Signed)
  Problem: Health Behavior/Discharge Planning: Goal: Ability to manage health-related needs will improve Outcome: Progressing   Problem: Clinical Measurements: Goal: Will remain free from infection Outcome: Progressing Goal: Respiratory complications will improve Outcome: Progressing   Problem: Activity: Goal: Risk for activity intolerance will decrease Outcome: Progressing   Problem: Coping: Goal: Level of anxiety will decrease Outcome: Progressing   Problem: Pain Managment: Goal: General experience of comfort will improve and/or be controlled Outcome: Progressing   Problem: Safety: Goal: Ability to remain free from injury will improve Outcome: Progressing   Problem: Skin Integrity: Goal: Risk for impaired skin integrity will decrease Outcome: Progressing

## 2024-01-30 NOTE — Progress Notes (Signed)
 Orthopedic Tech Progress Note Patient Details:  Joe Coleman Nov 30, 1945 [id number]  Patient ID: Lamar LELON Candy, male   DOB: 02/23/1945, 78 y.o.   MRN: [id number] No OHF. Age restricted. Adine MARLA Blush 01/30/2024, 4:00 PM

## 2024-01-30 NOTE — Op Note (Signed)
 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 Right septic knee  Post-Op Diagnosis: Same  Procedures: CPT 27310-Incision and drainage of right septic knee CPT 14020-Rerrangement of open wound right knee with closure (6 sq cm)  Surgeons : Primary: [name] Franky SQUIBB, MD  Assistant: Lauraine Moores, PA-C  Location: OR 3   Anesthesia: General   Antibiotics: Scheduled IV antibiotics, 1 gm vancomycin  powder and 1.2gm tobramycin  powder placed topically   Tourniquet time: None    Estimated Blood Loss: 75 mL  Complications:* No complications entered in OR log *   Specimens: ID Type Source Tests Collected by Time Destination  A : Right Knee Synovial Fluid Tissue Soft Tissue, Other AEROBIC/ANAEROBIC CULTURE W GRAM STAIN (SURGICAL/DEEP WOUND) [name] Franky SQUIBB, MD 01/30/2024 1116   B : Right Knee Synovium Tissue Soft Tissue, Other AEROBIC/ANAEROBIC CULTURE W VONNE STAIN (SURGICAL/DEEP WOUND) [name] Franky SQUIBB, MD 01/30/2024 1117      Implants: * No implants in log *   Indications for Surgery: 78 year old male who sustained a right proximal tibia fracture in July of this year.  He developed a postoperative infection and was treated with removal of his hardware.  He has had a chronic wound over his proximal tibia that he has been treated with wound care and IV antibiotics.  He returned to the hospital with sepsis and a large knee effusion.  An aspiration was performed which showed positive cultures with cell count of 69,000.  He is indicated for I&D of his knee.  We discussed possible treatment option including resection arthroplasty and 2 staged approach but unfortunately his tibia fracture has not healed well enough for that.  We also discussed the possibility of above-knee amputation.  He wishes to pursue I&D and suppression with trying to get his fracture  to heal and to undergo a two-stage explant and reimplant of his prosthesis.  Risks and benefits were discussed with the patient.  He agreed to proceed with surgery and consent was obtained.  Operative Findings: 1.  I&D of right septic knee with cultures sent.  1 g of vancomycin  powder 1.2 g of tobramycin  powder placed into the knee joint. 2.  Chronic wound over proximal tibia treated with rearrangement of soft tissue and closure measuring 4 x 1.5 cm in size.  Procedure: The patient was identified in the preoperative holding area. Consent was confirmed with the patient and their family and all questions were answered. The operative extremity was marked after confirmation with the patient. he was then brought back to the operating room by our anesthesia colleagues.  He was placed under general anesthetic and carefully transferred over to radiolucent flattop table.  A bump was placed under his operative hip.  The right lower extremity was then prepped and draped in usual sterile fashion.  A timeout was performed to verify the patient, procedure, and the extremity.  Preoperative antibiotics were dosed.  I for started out by examining the wound.  He did have bone that was exposed in the wound which is about 4 x 1.5 cm.  I then used a Therapist, nutritional to undermine the anterior and posterior soft tissues to be able to approximate the skin edges together.  I was able to do this and approximate the skin edges for closure.  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  The  scar was more laterally based on the patella and so as a result I performed a 4 cm arthrotomy to enter the knee joint.  I encountered some murky looking synovial fluid which was sent for culture.  I then proceeded to debride the suprapatellar pouch and the synovium and sent this for culture.  I then used irrigation with 3 L normal saline to irrigate the knee joint.  I then placed  a gram of vancomycin  powder 1.2 g tobramycin  powder into the knee joint.  The capsule was closed with 0 PDS.  The skin was closed with 3-0 nylon.  The wound over his proximal tibia was closed with 3-0 nylon.  Sterile dressings were applied.  Patient was then awoke from anesthesia and taken to the PACU in stable condition.   Debridement type: Excisional Debridement  Side: right  Body Location: Knee  Tools used for debridement: rongeur  Pre-debridement Wound size (cm):   N/A  Post-debridement Wound size (cm):   N/A  Debridement depth beyond dead/damaged tissue down to healthy viable tissue: yes  Tissue layer involved: skin, subcutaneous tissue, muscle / fascia  Nature of tissue removed: Purulence  Irrigation volume: 3L     Irrigation fluid type: Normal Saline   Post Op Plan/Instructions: Patient will be weightbearing as tolerated to the right lower extremity.  He will receive continued IV antibiotics.  Will have him mobilize with physical occupational therapy.  Will have him follow-up as an outpatient in 2 to 3 weeks for x-rays and suture removal.  I was present and performed the entire surgery.  Lauraine Moores, PA-C did assist me throughout the case. An assistant was necessary given the difficulty in approach, maintenance of reduction and ability to instrument the fracture.   Franky Light, MD [facility]

## 2024-01-30 NOTE — H&P (View-Only) (Signed)
 Ortho Progress Note  Patient seen and evaluated this morning.  I reviewed his lab work and studies.  I appreciate the hospitalist and infectious disease service with their assistance with his management.  He has a complex problem.  He underwent open reduction internal fixation of a comminuted complex proximal tibia fracture.  He did have a soft tissue injury associated with this.  He eventually had wound breakdown and infection requiring hardware removal in September.  We have been trying to treat him with antibiotics and suppression.  That cultures grew out Klebsiella and Enterobacter.  He returned in sepsis with drainage from his wound as well as a significant knee effusion.  We aspirated his knee on 1223 which showed 69,000 cells and a arthroplasty.  The cultures are growing out staph which is a different bacteria than the previous 2 that were identified on his postop infection.  Unfortunately I discussed his case with one of my arthroplasty colleagues and he felt that a explant and two-stage would not be appropriate due to his lack of union of his tibia.  As a result the 2 options are to perform an I&D and suppress his infection and his arthroplasty until he can heal his fracture and then perform a two-stage explant and revision surgery.  The other option is to perform an above-knee amputation.  I discussed both of these options with the patient today.  He would like to save his leg at all costs at this time.  We will plan to proceed with I&D today.  If he gets reinfected or we are not able to clear his infection enough to get the tibia fracture to heal we may have to pursue the above-knee amputation.  I discussed this over the phone with his niece as well.  They are in agreement.  Will plan to take cultures intraoperatively today.  Joe MYRTIS Light, MD [facility] [phone] (office) [email or web address]

## 2024-01-30 NOTE — Anesthesia Preprocedure Evaluation (Addendum)
"                                    Anesthesia Evaluation  Patient identified by MRN, date of birth, ID band Patient awake    Reviewed: Allergy & Precautions, H&P , NPO status , Patient's Chart, lab work & pertinent test results  History of Anesthesia Complications Negative for: history of anesthetic complications  Airway Mallampati: III  TM Distance: >3 FB Neck ROM: Full    Dental  (+) Edentulous Upper, Dental Advisory Given, Chipped, Poor Dentition, Missing   Pulmonary shortness of breath, asthma , pneumonia, COPD, former smoker   Pulmonary exam normal breath sounds clear to auscultation       Cardiovascular hypertension, Normal cardiovascular exam Rhythm:Regular Rate:Normal     Neuro/Psych  PSYCHIATRIC DISORDERS  Depression    negative neurological ROS     GI/Hepatic Neg liver ROS,GERD  Medicated and Controlled,,Stomach / rectosigmoid cancer   Endo/Other  diabetes, Type 2    Renal/GU Renal disease     Musculoskeletal  (+) Arthritis ,  Hx of Tibial fracture s/p ORIF now infected   Abdominal   Peds  Hematology  (+) Blood dyscrasia, anemia   Anesthesia Other Findings   Reproductive/Obstetrics                              Anesthesia Physical Anesthesia Plan  ASA: 3  Anesthesia Plan: General   Post-op Pain Management: Tylenol  PO (pre-op)*   Induction: Intravenous  PONV Risk Score and Plan: 3 and Ondansetron , Dexamethasone  and Treatment may vary due to age or medical condition  Airway Management Planned: Oral ETT  Additional Equipment: None  Intra-op Plan:   Post-operative Plan: Extubation in OR  Informed Consent: I have reviewed the patients History and Physical, chart, labs and discussed the procedure including the risks, benefits and alternatives for the proposed anesthesia with the patient or authorized representative who has indicated his/her understanding and acceptance.     Dental advisory  given  Plan Discussed with: CRNA  Anesthesia Plan Comments:          Anesthesia Quick Evaluation  "

## 2024-01-30 NOTE — Plan of Care (Signed)

## 2024-01-30 NOTE — Discharge Instructions (Addendum)
 "  [facility] Discharge Instructions  Discharge Wound Care Instructions  Wound Care: You may remove your surgical dressing on Wednesday 02/04/24. Surgical incisions should be dressed daily. Use one layer of adaptic or Mepitel, then gauze, Kerlix, and an ace wrap. The adaptic can be discontinued once the draining has ceased  Once the incision is completely dry and without drainage, it may be left open to air out.  Showering may begin Wednesday 02/04/24.  Clean incision gently with soap and water .  Do NOT apply any ointments, solutions or lotions to pin sites or surgical wounds.  These prevent needed drainage and even though solutions like hydrogen peroxide kill bacteria, they also damage cells lining the pin sites that help fight infection.  Applying lotions or ointments can keep the wounds moist and can cause them to breakdown and open up as well. This can increase the risk for infection. When in doubt call the office.   General Discharge Instructions  WEIGHT BEARING STATUS:weightbearing as tolerated  RANGE OF MOTION/ACTIVITY: Knee motion as tolerated  DVT/PE treatment: Eliquis   Diet: as you were eating previously.  Can use over the counter stool softeners and bowel preparations, such as Miralax , to help with bowel movements.  Narcotics can be constipating.  Be sure to drink plenty of fluids  PAIN MEDICATION USE AND EXPECTATIONS  You have likely been given narcotic medications to help control your pain.  After a traumatic event that results in an fracture (broken bone) with or without surgery, it is ok to use narcotic pain medications to help control one's pain.  We understand that everyone responds to pain differently and each individual patient will be evaluated on a regular basis for the continued need for narcotic medications. Ideally, narcotic medication use should last no more than 6-8 weeks (coinciding with fracture healing).   As a patient it is your responsibility as  well to monitor narcotic medication use and report the amount and frequency you use these medications when you come to your office visit.   We would also advise that if you are using narcotic medications, you should take a dose prior to therapy to maximize you participation.  IF YOU ARE ON NARCOTIC MEDICATIONS IT IS NOT PERMISSIBLE TO OPERATE A MOTOR VEHICLE (MOTORCYCLE/CAR/TRUCK/MOPED) OR HEAVY MACHINERY DO NOT MIX NARCOTICS WITH OTHER CNS (CENTRAL NERVOUS SYSTEM) DEPRESSANTS SUCH AS ALCOHOL  POST-OPERATIVE OPIOID TAPER INSTRUCTIONS: It is important to wean off of your opioid medication as soon as possible. If you do not need pain medication after your surgery it is ok to stop day one. Opioids include: Codeine , Hydrocodone (Norco, Vicodin), Oxycodone (Percocet, oxycontin ) and hydromorphone  amongst others.  Long term and even short term use of opiods can cause: Increased pain response Dependence Constipation Depression Respiratory depression And more.  Withdrawal symptoms can include Flu like symptoms Nausea, vomiting And more Techniques to manage these symptoms Hydrate well Eat regular healthy meals Stay active Use relaxation techniques(deep breathing, meditating, yoga) Do Not substitute Alcohol to help with tapering If you have been on opioids for less than two weeks and do not have pain than it is ok to stop all together.  Plan to wean off of opioids This plan should start within one week post op of your fracture surgery  Maintain the same interval or time between taking each dose and first decrease the dose.  Cut the total daily intake of opioids by one tablet each day Next start to increase the time between doses. The last dose that should  be eliminated is the evening dose.    STOP SMOKING OR USING NICOTINE  PRODUCTS!!!!  As discussed nicotine  severely impairs your body's ability to heal surgical and traumatic wounds but also impairs bone healing.  Wounds and bone heal by  forming microscopic blood vessels (angiogenesis) and nicotine  is a vasoconstrictor (essentially, shrinks blood vessels).  Therefore, if vasoconstriction occurs to these microscopic blood vessels they essentially disappear and are unable to deliver necessary nutrients to the healing tissue.  This is one modifiable factor that you can do to dramatically increase your chances of healing your injury.  (This means no smoking, no nicotine  gum, patches, etc)  DO NOT USE NONSTEROIDAL ANTI-INFLAMMATORY DRUGS (NSAID'S)  Using products such as Advil  (ibuprofen ), Aleve (naproxen), Motrin  (ibuprofen ) for additional pain control during fracture healing can delay and/or prevent the healing response.  If you would like to take over the counter (OTC) medication, Tylenol  (acetaminophen ) is ok.  However, some narcotic medications that are given for pain control contain acetaminophen  as well. Therefore, you should not exceed more than 4000 mg of tylenol  in a day if you do not have liver disease.  Also note that there are may OTC medicines, such as cold medicines and allergy medicines that my contain tylenol  as well.  If you have any questions about medications and/or interactions please ask your doctor/PA or your pharmacist.      ICE AND ELEVATE INJURED/OPERATIVE EXTREMITY  Using ice and elevating the injured extremity above your heart can help with swelling and pain control.  Icing in a pulsatile fashion, such as 20 minutes on and 20 minutes off, can be followed.    Do not place ice directly on skin. Make sure there is a barrier between to skin and the ice pack.    Using frozen items such as frozen peas works well as the conform nicely to the are that needs to be iced.  USE AN ACE WRAP OR TED HOSE FOR SWELLING CONTROL  In addition to icing and elevation, Ace wraps or TED hose are used to help limit and resolve swelling.  It is recommended to use Ace wraps or TED hose until you are informed to stop.    When using Ace Wraps  start the wrapping distally (farthest away from the body) and wrap proximally (closer to the body)   Example: If you had surgery on your leg or thing and you do not have a splint on, start the ace wrap at the toes and work your way up to the thigh        If you had surgery on your upper extremity and do not have a splint on, start the ace wrap at your fingers and work your way up to the upper arm   CALL THE OFFICE FOR MEDICATION REFILLS OR WITH ANY QUESTIONS/CONCERNS: [phone]   VISIT OUR WEBSITE FOR ADDITIONAL INFORMATION: [email or web address]    Call office for the following: Temperature greater than 101F Persistent nausea and vomiting Severe uncontrolled pain Redness, tenderness, or signs of infection (pain, swelling, redness, odor or green/yellow discharge around the site) Difficulty breathing, headache or visual disturbances Hives Persistent dizziness or light-headedness Extreme fatigue Any other questions or concerns you may have after discharge  In an emergency, call 911 or go to an Emergency Department at a nearby hospital  OTHER HELPFUL INFORMATION  If you had a block, it will wear off between 8-24 hrs postop typically.  This is period when your pain may go from nearly zero to the  pain you would have had postop without the block.  This is an abrupt transition but nothing dangerous is happening.  You may take an extra dose of narcotic when this happens.  You should wean off your narcotic medicines as soon as you are able.  Most patients will be off or using minimal narcotics before their first postop appointment.   We suggest you use the pain medication the first night prior to going to bed, in order to ease any pain when the anesthesia wears off. You should avoid taking pain medications on an empty stomach as it will make you nauseous.  Do not drink alcoholic beverages or take illicit drugs when taking pain medications.  In most states it is against the law to drive while  you are in a splint or sling.  And certainly against the law to drive while taking narcotics.  You may return to work/school in the next couple of days when you feel up to it.   Pain medication may make you constipated.  Below are a few solutions to try in this order: Decrease the amount of pain medication if you arent having pain. Drink lots of decaffeinated fluids. Drink prune juice and/or each dried prunes  If the first 3 dont work start with additional solutions Take Colace - an over-the-counter stool softener Take Senokot - an over-the-counter laxative Take Miralax  - a stronger over-the-counter laxative     "

## 2024-01-31 ENCOUNTER — Encounter (HOSPITAL_COMMUNITY): Payer: Self-pay | Admitting: Student

## 2024-01-31 ENCOUNTER — Other Ambulatory Visit (HOSPITAL_COMMUNITY): Payer: Self-pay

## 2024-01-31 DIAGNOSIS — M009 Pyogenic arthritis, unspecified: Secondary | ICD-10-CM | POA: Diagnosis not present

## 2024-01-31 LAB — BASIC METABOLIC PANEL WITH GFR
Anion gap: 9 (ref 5–15)
BUN: 26 mg/dL — ABNORMAL HIGH (ref 8–23)
CO2: 23 mmol/L (ref 22–32)
Calcium: 8.4 mg/dL — ABNORMAL LOW (ref 8.9–10.3)
Chloride: 104 mmol/L (ref 98–111)
Creatinine, Ser: 1.43 mg/dL — ABNORMAL HIGH (ref 0.61–1.24)
GFR, Estimated: 50 mL/min — ABNORMAL LOW
Glucose, Bld: 314 mg/dL — ABNORMAL HIGH (ref 70–99)
Potassium: 4.7 mmol/L (ref 3.5–5.1)
Sodium: 136 mmol/L (ref 135–145)

## 2024-01-31 LAB — CBC WITH DIFFERENTIAL/PLATELET
Abs Immature Granulocytes: 0.08 K/uL — ABNORMAL HIGH (ref 0.00–0.07)
Basophils Absolute: 0 K/uL (ref 0.0–0.1)
Basophils Relative: 0 %
Eosinophils Absolute: 0 K/uL (ref 0.0–0.5)
Eosinophils Relative: 0 %
HCT: 29.1 % — ABNORMAL LOW (ref 39.0–52.0)
Hemoglobin: 9.2 g/dL — ABNORMAL LOW (ref 13.0–17.0)
Immature Granulocytes: 1 %
Lymphocytes Relative: 7 %
Lymphs Abs: 0.7 K/uL (ref 0.7–4.0)
MCH: 27.7 pg (ref 26.0–34.0)
MCHC: 31.6 g/dL (ref 30.0–36.0)
MCV: 87.7 fL (ref 80.0–100.0)
Monocytes Absolute: 0.4 K/uL (ref 0.1–1.0)
Monocytes Relative: 4 %
Neutro Abs: 9 K/uL — ABNORMAL HIGH (ref 1.7–7.7)
Neutrophils Relative %: 88 %
Platelets: 276 K/uL (ref 150–400)
RBC: 3.32 MIL/uL — ABNORMAL LOW (ref 4.22–5.81)
RDW: 14.1 % (ref 11.5–15.5)
WBC: 10.2 K/uL (ref 4.0–10.5)
nRBC: 0 % (ref 0.0–0.2)

## 2024-01-31 LAB — GLUCOSE, CAPILLARY
Glucose-Capillary: 297 mg/dL — ABNORMAL HIGH (ref 70–99)
Glucose-Capillary: 221 mg/dL — ABNORMAL HIGH (ref 70–99)
Glucose-Capillary: 218 mg/dL — ABNORMAL HIGH (ref 70–99)
Glucose-Capillary: 264 mg/dL — ABNORMAL HIGH (ref 70–99)

## 2024-01-31 LAB — PROCALCITONIN: Procalcitonin: 0.24 ng/mL

## 2024-01-31 LAB — BPAM RBC
Blood Product Expiration Date: 202601232359
ISSUE DATE / TIME: 202512260831
Unit Type and Rh: 5100

## 2024-01-31 LAB — TYPE AND SCREEN
ABO/RH(D): O POS
Antibody Screen: NEGATIVE
Unit division: 0

## 2024-01-31 LAB — C-REACTIVE PROTEIN: CRP: 11.9 mg/dL — ABNORMAL HIGH

## 2024-01-31 LAB — MAGNESIUM: Magnesium: 2.1 mg/dL (ref 1.7–2.4)

## 2024-01-31 NOTE — Progress Notes (Signed)
" ° ° ° °  1 Day Post-Op Procedures (LRB): IRRIGATION AND DEBRIDEMENT KNEE (Right) Subjective:  Patient reports pain as mild to moderate.  Requesting water .  Slept okay.  No overnight events.  Mobilize with PT today.  Denies chest pain, ShOB, N/V.  Denies numbness or tingling.  No other complaints at this time.  Objective:   VITALS:   Vitals:   01/31/24 0021 01/31/24 0200 01/31/24 0400 01/31/24 0800  BP: 121/71  138/65 (!) 166/71  Pulse:  67 60 71  Resp:  17 18 20   Temp: (!) 97.3 F (36.3 C)  (!) 97.1 F (36.2 C) 97.9 F (36.6 C)  TempSrc: Oral  Oral Oral  SpO2:  100% 100% 100%  Weight:      Height:        Resting in bed in NAD Neurologically intact Sensation intact distally Intact pulses distally Dorsiflexion/Plantar flexion intact Incision: dressing C/D/I Compartment soft Wiggles toes appropriately   Lab Results  Component Value Date   WBC 10.2 01/31/2024   HGB 9.2 (L) 01/31/2024   HCT 29.1 (L) 01/31/2024   MCV 87.7 01/31/2024   PLT 276 01/31/2024   BMET    Component Value Date/Time   NA 136 01/31/2024 0546   NA 141 10/14/2013 1508   K 4.7 01/31/2024 0546   K 3.9 10/14/2013 1508   CL 104 01/31/2024 0546   CL 111 (H) 10/14/2013 1508   CO2 23 01/31/2024 0546   CO2 23 10/14/2013 1508   GLUCOSE 314 (H) 01/31/2024 0546   GLUCOSE 172 (H) 10/14/2013 1508   BUN 26 (H) 01/31/2024 0546   BUN 22 (H) 10/14/2013 1508   CREATININE 1.43 (H) 01/31/2024 0546   CREATININE 1.83 (H) 12/10/2023 1017   CALCIUM  8.4 (L) 01/31/2024 0546   CALCIUM  8.6 10/14/2013 1508   GFRNONAA 50 (L) 01/31/2024 0546   GFRNONAA 52 (L) 10/14/2013 1508    Yesterday's total administered Morphine  Milligram Equivalents: 28.5  Xray: None  Assessment/Plan: 1 Day Post-Op   Principal Problem:   Septic arthritis of knee, right (HCC) Active Problems:   Protein-calorie malnutrition, severe   Procedures (LRB): IRRIGATION AND DEBRIDEMENT KNEE (Right)   Narrative: 01/31/2024:    Plan: Up  with therapy Incentive Spirometry Elevate and Apply ice Hgb mild post-operative anemia, improving to 9.2 today Follow up on cultures -- NGTD, though one specimen with abundant WBC  Weightbearing: WBAT RLE ROM/Precautions:  No precautions Mobility: Continue with PT/OT Insicional and dressing care: Reinforce dressings PRN Pain management: multimodal  Antibiotics: Per infectious disease Diet: Advance as tolerated VTE prophylaxis: Resume home Eliquis  Dispo: per medicine Follow - up plan: follow up in office in 2-3 weeks with [name] Hila Joe Coleman 01/31/2024, 12:13 PM   Contact information:   T[facility] 7am-5pm epic message Dr. Edna, or call office for patient follow up: [phone] After hours and holidays please check [email or web address] for group call information for [facility]    "

## 2024-01-31 NOTE — Evaluation (Signed)
 Occupational Therapy Evaluation Patient Details Name: Joe Coleman MRN: [id number] DOB: December 03, 1945 Today's Date: 01/31/2024   History of Present Illness   Pt is 78 y.o. male transfered from [facility] ED to [facility] 01/26/24 due to increased pain, redness, and drainage at prior R LE wound below knee with CT of R LE in [facility] ED revealing a moderate to large thick-walled joint effusion as well as a comminuted proximal tibia fracture. S/p R knee aspiration on 12/23 and R knee I&D on 12/26. PMHx: T2DM, COPD, HTN, CKD IIIa, BPH, GERD, past tobacco abuse, and hx R tib fx s/p ORIF (7/25), wound infection (8/28), wound dehiscence with surgical hardware visible (9/9), s/p removal of hardware, I&D, skin graft, and wound vac placement (9/10), wound vac change (9/23). Hospitalization 9/9-9/19 for right leg wound with infected hardware d/t Enterobacter & Klebsiella, placed on IV antibiotics as well as acute LLE distal DVT started on Eliquis . Hospitalization 9/24-9/30 with AMS, AKI, and rhabdomyolysis     Clinical Impressions At baseline, pt performs ADLs Ind to Mod I, receives assistance for IADLs, and performs functional mobility with a RW. Pt now presents with decreased activity tolerance, R LE pain affecting functional level, decreased balance, and decreased safety and independence with functional tasks. Pt currently demonstrating ability to complete ADLs largely with Set up to Max assist +2, bed mobility with Min to Mod assist, and functional transfers with a RW with Mod assist +2. Pt WBAT in R LE, but pt with significant lean to the Left in standing/stepping due to attempting to offload R LE with pt requiring cues to correct. Pt participated well in session and is motivated to return to PLOF. Suspect pt will progress well with improved pain management. Pt will benefit from acute skilled OT services to address deficits and increase safety and independence with functional tasks. Post acute discharge, pt will benefit from  intensive inpatient skilled rehab services < 3 hours per day to maximize rehab potential, decrease risk of falls, and decrease risk of rehospitalization.      If plan is discharge home, recommend the following:   Two people to help with walking and/or transfers;Two people to help with bathing/dressing/bathroom;Assistance with cooking/housework;Direct supervision/assist for medications management;Assist for transportation;Help with stairs or ramp for entrance     Functional Status Assessment   Patient has had a recent decline in their functional status and demonstrates the ability to make significant improvements in function in a reasonable and predictable amount of time.     Equipment Recommendations   Tub/shower bench;Other (comment) (RW)     Recommendations for Other Services         Precautions/Restrictions   Precautions Precautions: Fall Restrictions Weight Bearing Restrictions Per Provider Order: Yes RLE Weight Bearing Per Provider Order: Weight bearing as tolerated     Mobility Bed Mobility Overal bed mobility: Needs Assistance Bed Mobility: Supine to Sit     Supine to sit: Min assist, Mod assist, HOB elevated     General bed mobility comments: assist to manage R LE, with increased time; with cue for hand placement/technique    Transfers Overall transfer level: Needs assistance Equipment used: Rolling walker (2 wheels) Transfers: Sit to/from Stand, Bed to chair/wheelchair/BSC Sit to Stand: Mod assist, +2 physical assistance, +2 safety/equipment     Step pivot transfers: Mod assist, +2 physical assistance, +2 safety/equipment     General transfer comment: Mod assist +2 to power up from EOB on lowest setting; cues for hand placement/technique with RW; assist to guide  RW during transfer      Balance Overall balance assessment: Needs assistance Sitting-balance support: Single extremity supported, No upper extremity supported, Feet supported Sitting  balance-Leahy Scale: Good     Standing balance support: Bilateral upper extremity supported, During functional activity, Reliant on assistive device for balance Standing balance-Leahy Scale: Poor Standing balance comment: Reliant on B UE support. Pt with R LE WBAT, but with pt presetning with heavy lean to Left in attempt to offload R LE in standing and stepping.                           ADL either performed or assessed with clinical judgement   ADL Overall ADL's : Needs assistance/impaired Eating/Feeding: Set up;Sitting   Grooming: Set up;Sitting   Upper Body Bathing: Supervision/ safety;Contact guard assist;Sitting   Lower Body Bathing: Maximal assistance;Sit to/from stand;+2 for safety/equipment;Cueing for compensatory techniques   Upper Body Dressing : Contact guard assist;Sitting   Lower Body Dressing: Maximal assistance;Sit to/from stand;+2 for safety/equipment   Toilet Transfer: Moderate assistance;+2 for physical assistance;+2 for safety/equipment;Rolling walker (2 wheels);BSC/3in1 (step-pivot transfer) Toilet Transfer Details (indicate cue type and reason): simulated bed to recliner Toileting- Clothing Manipulation and Hygiene: Maximal assistance;Sit to/from stand;+2 for safety/equipment         General ADL Comments: Pt with decreased activity tolerance and pain affecting functional level     Vision Baseline Vision/History: 1 Wears glasses Ability to See in Adequate Light: 0 Adequate Patient Visual Report: No change from baseline Vision Assessment?: No apparent visual deficits Additional Comments: Vision WFL with glassesfor tasks assessed; not formally screened or evaluated     Perception         Praxis         Pertinent Vitals/Pain Pain Assessment Pain Assessment: Faces Faces Pain Scale: Hurts even more Pain Location: R knee, worse in standing and stepping Pain Descriptors / Indicators: Aching, Discomfort, Grimacing, Guarding Pain  Intervention(s): Limited activity within patient's tolerance, Monitored during session, Repositioned     Extremity/Trunk Assessment Upper Extremity Assessment Upper Extremity Assessment: Right hand dominant;Generalized weakness;RUE deficits/detail;LUE deficits/detail   Lower Extremity Assessment Lower Extremity Assessment: Defer to PT evaluation       Communication Communication Communication: Impaired Factors Affecting Communication: Reduced clarity of speech   Cognition Arousal: Alert Behavior During Therapy: WFL for tasks assessed/performed Cognition: Cognition impaired, No family/caregiver present to determine baseline     Awareness: Intellectual awareness intact, Online awareness intact (intermittent online awareness) Memory impairment (select all impairments): Short-term memory Attention impairment (select first level of impairment): Divided attention Executive functioning impairment (select all impairments): Problem solving OT - Cognition Comments: Pt AAOx4 and pleasant throughout session. Pt cognition largely [facility] for tasks assessed with mild deficits noted above. Cognition not formally screened or evaluated                 Following commands: Intact       Cueing  General Comments   Cueing Techniques: Verbal cues;Gestural cues;Tactile cues;Visual cues  VSS on RA throughout session. RN present briefly toward beginning of session to administer medication.   Exercises     Shoulder Instructions      Home Living Family/patient expects to be discharged to:: Private residence Living Arrangements: Alone Available Help at Discharge: Personal care attendant;Available PRN/intermittently;Family (Has a person who comes to clean and do laundry for a few hours every other morning; niece lives nearby and can assist PRN) Type of Home: Apartment Home Access:  Stairs to enter [employer] of Steps: 1+1 (curb and then 1 step into apartment) Entrance Stairs-Rails:  None Home Layout: One level     Bathroom Shower/Tub: Chief Strategy Officer: Standard     Home Equipment: Agricultural Consultant (2 wheels);Rollator (4 wheels);Wheelchair - manual;BSC/3in1;Shower seat;Grab bars - tub/shower;Hand held shower head   Additional Comments: Pt reports his RW is not in good condition      Prior Functioning/Environment Prior Level of Function : Independent/Modified Independent             Mobility Comments: Using a RW ADLs Comments: Ind to Mod I with ADLs, assist with IADLs    OT Problem List: Decreased strength;Decreased activity tolerance;Impaired balance (sitting and/or standing);Decreased knowledge of use of DME or AE;Decreased knowledge of precautions;Pain   OT Treatment/Interventions: Self-care/ADL training;Therapeutic exercise;Energy conservation;DME and/or AE instruction;Therapeutic activities;Patient/family education;Balance training      OT Goals(Current goals can be found in the care plan section)   Acute Rehab OT Goals Patient Stated Goal: to have less pain, stay as independent as possible, and be able to return home OT Goal Formulation: With patient Time For Goal Achievement: 02/14/24 Potential to Achieve Goals: Good ADL Goals Pt Will Perform Lower Body Bathing: with contact guard assist;sitting/lateral leans;sit to/from stand (with adaptive equipment as needed) Pt Will Perform Lower Body Dressing: with contact guard assist;sitting/lateral leans;sit to/from stand (with adaptive equipment as needed) Pt Will Transfer to Toilet: with contact guard assist;ambulating;bedside commode (with least restrictive AD) Pt Will Perform Toileting - Clothing Manipulation and hygiene: with contact guard assist;sitting/lateral leans;sit to/from stand Pt Will Perform Tub/Shower Transfer: Tub transfer;with contact guard assist;ambulating;tub bench (with least restrictive AD) Pt/caregiver will Perform Home Exercise Program: Increased strength;Both  right and left upper extremity;With theraband;Independently;With written HEP provided   OT Frequency:  Min 2X/week    Co-evaluation PT/OT/SLP Co-Evaluation/Treatment: Yes Reason for Co-Treatment: For patient/therapist safety;To address functional/ADL transfers PT goals addressed during session: Mobility/safety with mobility;Proper use of DME;Balance OT goals addressed during session: ADL's and self-care      AM-PAC OT 6 Clicks Daily Activity     Outcome Measure Help from another person eating meals?: A Little Help from another person taking care of personal grooming?: A Little Help from another person toileting, which includes using toliet, bedpan, or urinal?: A Lot Help from another person bathing (including washing, rinsing, drying)?: A Lot Help from another person to put on and taking off regular upper body clothing?: A Little Help from another person to put on and taking off regular lower body clothing?: A Lot 6 Click Score: 15   End of Session Equipment Utilized During Treatment: Rolling walker (2 wheels) Nurse Communication: Mobility status;Other (comment);Weight bearing status (Pt up in chair with call bell in reach and alarm on. Pt requires +2 assist for step-pivot transfers with RW.)  Activity Tolerance: Patient tolerated treatment well Patient left: in chair;with call bell/phone within reach;with chair alarm set  OT Visit Diagnosis: Unsteadiness on feet (R26.81);Other abnormalities of gait and mobility (R26.89);Muscle weakness (generalized) (M62.81);Pain                Time: 1332-1405 OT Time Calculation (min): 33 min Charges:  OT General Charges $OT Visit: 1 Visit OT Evaluation $OT Eval Moderate Complexity: 1 Mod  Joe Rockey HERO., OTR/L, MA Acute Rehab [phone]   Joe Coleman 01/31/2024, 2:53 PM

## 2024-01-31 NOTE — Progress Notes (Addendum)
 "                                                                                                                                                                                                                                                                                PROGRESS NOTE     Patient Demographics:    Joe Coleman, is a 78 y.o. male, DOB - Apr 01, 1945, FMW:[id number]  Outpatient Primary MD for the patient is [facility]    LOS - 5  Admit date - 01/26/2024    No chief complaint on file.      Brief Narrative (HPI from H&P)     78 y.o. male with medical history significant of diabetes mellitus 2 on metformin , BPH on Flomax , CKD 3A, hypertension, GERD, COPD and history of DVT on Eliquis .  Patient was hospitalized back in September 2025 due to infected hardware in the right knee.  He underwent removal of hardware.  Wound cultures at that time positive for pansensitive Enterobacter and Klebsiella.  He was discharged home on linezolid  and doxycycline  as well as 25 days of IV ertapenem  to treat underlying osteomyelitis.  Readmitted to the hospital in November 2025 due to sepsis secondary to pneumonia.  He was discharged on Augmentin  as well as supportive care.    He states several days before he presented this time he followed up with his orthopedic team and follow-up surgical intervention was recommended.  He states he does have some memory issues and this may not be an accurate assessment.  He reports about 2 days ago he began having significant increase in pain and drainage and increased redness at the prior right lower extremity wound below the knee.  He had run out of his prior Percocet and was having difficulty managing his pain.  He presented to the ED where CT of the knee and lower extremity revealed a moderate to large thick-walled joint effusion as well as a comminuted proximal tibia fracture.  He was evaluated by Dr. Cristy with orthopedic team at [facility] and due to the complexity of his case it was recommended that he be transferred to [facility]    Subjective:  Patient was seen and examined this morning before his surgery.   He reports pain postop in right knee area, but controlled, good night sleep,    Assessment  & Plan :   Septic right knee with effusion Previous infection with osteomyelitis requiring removal of prior knee hardware in September 2025 and was treated with prolonged oral and IV antibiotics.  Cultures during that admission were positive for pansensitive Enterobacter and Klebsiella.   - Antibiotics management per ID  - Hepatic input greatly appreciated, status post arthrocentesis 12/23, with cell count of 69,000, plan for I&D of right knee by [name], went for I&D 01/30/2024. -Intraoperative cultures with no growth seen, but arthrocentesis fluid growing MSSA, antibiotic management per ID, currently on cefazolin . -Postop recommendation by [name] Scull Op Plan/Instructions: Patient will be weightbearing as tolerated to the right lower extremity.  He will receive continued IV antibiotics.  Will have him mobilize with physical occupational therapy.  Will have him follow-up as an outpatient in 2 to 3 weeks for x-rays and suture removal. -Discussed with orthopedic if okay to resume him on Eliquis  this evening.     AKI on CKD IIIa baseline creatinine around 1.3, gentle hydration IV fluids, monitor bladder scans and BMP.   History of DVT on Eliquis ,  Initially on full dose Lovenox  in anticipation for surgery, now postop he is back on Eliquis .  Anemia - CV 1 unit PRBC yesterday for hemoglobin of 7.6 and plan for surgery, hemoglobin stable this morning at 9.2.   BPH Continue Flomax    Hypertension placed on low-dose Coreg .   COPD  Continue as needed albuterol  neb, On Trelegy at home-therapeutic substitution with Breztri , stable.   GERD  Continue PPI   Severe protein calorie malnutrition Current BMI 19.49,  add protein supplement  Hypophosphatemia - Replaced  Diabetes mellitus 2 - SSI.  Lab Results  Component Value Date   HGBA1C 6.0 (H) 01/25/2024   CBG (last 3)  Recent Labs    01/30/24 2106 01/31/24 0809 01/31/24 1240  GLUCAP 376* 297* 221*       Condition - Extremely Guarded  Family Communication  : None at bedside  Code Status : Full code  Consults  : Orthopedics, ID  PUD Prophylaxis : PPI   Procedures  :      CT 01/24/24 - R Knee -   1. Knee arthroplasty in expected alignment. Comminuted proximal tibial fracture extends to the lateral aspect of the tibial tray as well as the cement adjacent to the tibial stem. Tibial metaphyseal and tibial shaft component demonstrates persistent fracture lucency and no bony bridging, suspicious for delayed/nonunion. 2. Proximal fibular fracture has incomplete surrounding callus formation. 3. Moderate to large thick-walled joint effusion with synovial enhancement. This may be inflammatory or infectious, consider fluid sampling. 4. Generalized soft tissue edema. No focal fluid collection. No soft tissue gas.      Disposition Plan  :    Status is: Inpatient   DVT Prophylaxis  : Lovenox     Lab Results  Component Value Date   PLT 276 01/31/2024    Diet :  Diet Order             Diet regular Room service appropriate? Yes; Fluid consistency: Thin  Diet effective now                    Inpatient Medications  Scheduled Meds:  (feeding supplement) PROSource Plus  30 mL Oral BID BM   apixaban   5 mg Oral BID   budesonide -glycopyrrolate -formoterol   2 puff Inhalation BID   carvedilol   3.125 mg Oral BID WC   vitamin D3  1,000 Units Oral Daily   cyanocobalamin   1,000 mcg Subcutaneous Daily   Followed by   [name] ON 02/05/2024] cyanocobalamin   1,000 mcg Subcutaneous Q30 days   docusate sodium   100 mg Oral BID   feeding supplement (GLUCERNA SHAKE)  237 mL Oral TID BM   gabapentin   300 mg Oral QHS   insulin  aspart  0-5  Units Subcutaneous QHS   insulin  aspart  0-9 Units Subcutaneous TID WC   multivitamin with minerals  1 tablet Oral Daily   pantoprazole   40 mg Oral Daily   polyethylene glycol  17 g Oral Daily   sodium chloride  flush  3 mL Intravenous Q12H   tamsulosin   0.4 mg Oral Daily   thiamine   100 mg Per Tube Daily   Vitamin D  (Ergocalciferol )  50,000 Units Oral Q7 days   Continuous Infusions:   ceFAZolin  (ANCEF ) IV 2 g (01/31/24 1344)   PRN Meds:.acetaminophen  **OR** [DISCONTINUED] acetaminophen , melatonin, methocarbamol , metoCLOPramide  **OR** metoCLOPramide  (REGLAN ) injection, morphine  injection, ondansetron  **OR** ondansetron  (ZOFRAN ) IV, oxyCODONE , polyethylene glycol  Antibiotics  :    Anti-infectives (From admission, onward)    Start     Dose/Rate Route Frequency Ordered Stop   01/30/24 1430  ceFAZolin  (ANCEF ) IVPB 2g/100 mL premix        2 g 200 mL/hr over 30 Minutes Intravenous Every 8 hours 01/30/24 1344     01/30/24 1129  tobramycin  (NEBCIN ) powder  Status:  Discontinued          As needed 01/30/24 1149 01/30/24 1149   01/30/24 1129  vancomycin  (VANCOCIN ) powder  Status:  Discontinued          As needed 01/30/24 1149 01/30/24 1149   01/27/24 1800  DAPTOmycin  (CUBICIN ) IVPB 500 mg/50mL premix  Status:  Discontinued        8 mg/kg  59 kg 100 mL/hr over 30 Minutes Intravenous Daily 01/27/24 1036 01/30/24 1344   01/27/24 1600  ertapenem  (INVANZ ) 1 g in sodium chloride  0.9 % 100 mL IVPB  Status:  Discontinued        1 g 200 mL/hr over 30 Minutes Intravenous Every 24 hours 01/27/24 1036 01/30/24 1344   01/27/24 0915  ceFEPIme  (MAXIPIME ) 2 g in sodium chloride  0.9 % 100 mL IVPB  Status:  Discontinued        2 g 200 mL/hr over 30 Minutes Intravenous Every 12 hours 01/27/24 0825 01/27/24 1036   01/27/24 0500  vancomycin  (VANCOREADY) IVPB 750 mg/150 mL  Status:  Discontinued        750 mg 150 mL/hr over 60 Minutes Intravenous Every 24 hours 01/26/24 1616 01/27/24 1036   01/26/24 2200   cefTRIAXone  (ROCEPHIN ) 2 g in sodium chloride  0.9 % 100 mL IVPB  Status:  Discontinued        2 g 200 mL/hr over 30 Minutes Intravenous Daily at bedtime 01/26/24 1616 01/27/24 0825   01/26/24 1700  metroNIDAZOLE  (FLAGYL ) IVPB 500 mg  Status:  Discontinued        500 mg 100 mL/hr over 60 Minutes Intravenous Every 12 hours 01/26/24 1605 01/27/24 1036         Objective:   Vitals:   01/31/24 0200 01/31/24 0400 01/31/24 0800 01/31/24 1241  BP:  138/65 (!) 166/71 106/66  Pulse: 67 60 71 66  Resp: 17 18 20 16   Temp:  ROLLEN)  97.1 F (36.2 C) 97.9 F (36.6 C) 98 F (36.7 C)  TempSrc:  Oral Oral Rectal  SpO2: 100% 100% 100% 99%  Weight:      Height:        Wt Readings from Last 3 Encounters:  01/30/24 61.2 kg  01/24/24 59 kg  12/20/23 59 kg     Intake/Output Summary (Last 24 hours) at 01/31/2024 1514 Last data filed at 01/31/2024 0023 Gross per 24 hour  Intake --  Output 600 ml  Net -600 ml     Physical Exam  Awake Alert, Oriented X 3, frail, chronically ill-appearing Good air entry by laterally Regular rate and rhythm Abdomen soft Right lower extremity bandaged       Data Review:    Recent Labs  Lab 01/27/24 0320 01/28/24 0150 01/29/24 0227 01/30/24 0532 01/31/24 0546  WBC 12.4* 11.0* 8.8 7.9 10.2  HGB 8.4* 8.1* 8.0* 7.6* 9.2*  HCT 27.7* 26.1* 25.9* 25.1* 29.1*  PLT 227 244 242 258 276  MCV 90.8 88.2 88.1 89.0 87.7  MCH 27.5 27.4 27.2 27.0 27.7  MCHC 30.3 31.0 30.9 30.3 31.6  RDW 14.8 14.4 14.5 14.6 14.1  LYMPHSABS  --  0.8 0.9 1.1 0.7  MONOABS  --  1.2* 1.0 0.9 0.4  EOSABS  --  0.1 0.2 0.2 0.0  BASOSABS  --  0.0 0.0 0.0 0.0    Recent Labs  Lab 01/24/24 1837 01/24/24 2220 01/25/24 1825 01/26/24 0500 01/27/24 0320 01/27/24 0542 01/27/24 1309 01/28/24 0150 01/29/24 0227 01/30/24 0532 01/31/24 0546  NA 132*   < >  --    < > 133*  --   --  133* 135 138 136  K 6.6*   < >  --    < > 4.4  --   --  4.0 3.9 4.4 4.7  CL 100   < >  --    < > 102   --   --  103 103 105 104  CO2 19*   < >  --    < > 19*  --   --  18* 24 26 23   ANIONGAP 13   < >  --    < > 13  --   --  13 8 7 9   GLUCOSE 229*   < >  --    < > 128*  --   --  132* 171* 123* 314*  BUN 18   < >  --    < > 24*  --   --  23 20 22  26*  CREATININE 1.38*   < >  --    < > 1.54*  --   --  1.54* 1.40* 1.42* 1.43*  CRP 9.1*  --   --   --   --  30.3*  --  25.9* 18.6* 15.5* 11.9*  PROCALCITON 0.52  --   --   --   --  1.38  --  0.96 0.55 0.35 0.24  LATICACIDVEN 1.6  --   --   --   --   --   --   --   --   --   --   HGBA1C  --   --  6.0*  --   --   --   --   --   --   --   --   MG  --   --   --   --   --   --  1.9  1.9 2.0 2.0 2.1  PHOS  --   --   --   --   --   --  3.0 2.4* 2.2* 3.3  --   CALCIUM  8.4*   < >  --    < > 8.4*  --   --  8.6* 8.4* 8.4* 8.4*   < > = values in this interval not displayed.      Recent Labs  Lab 01/24/24 1837 01/24/24 2220 01/25/24 1825 01/26/24 0500 01/27/24 0320 01/27/24 0542 01/27/24 1309 01/28/24 0150 01/29/24 0227 01/30/24 0532 01/31/24 0546  CRP 9.1*  --   --   --   --  30.3*  --  25.9* 18.6* 15.5* 11.9*  PROCALCITON 0.52  --   --   --   --  1.38  --  0.96 0.55 0.35 0.24  LATICACIDVEN 1.6  --   --   --   --   --   --   --   --   --   --   HGBA1C  --   --  6.0*  --   --   --   --   --   --   --   --   MG  --   --   --   --   --   --  1.9 1.9 2.0 2.0 2.1  CALCIUM  8.4*   < >  --    < > 8.4*  --   --  8.6* 8.4* 8.4* 8.4*   < > = values in this interval not displayed.    --------------------------------------------------------------------------------------------------------------- No results found for: CHOL, HDL, LDLCALC, LDLDIRECT, TRIG, CHOLHDL  Lab Results  Component Value Date   HGBA1C 6.0 (H) 01/25/2024   No results for input(s): TSH, T4TOTAL, FREET4, T3FREE, THYROIDAB in the last 72 hours. No results for input(s): VITAMINB12, FOLATE, FERRITIN, TIBC, IRON , RETICCTPCT in the last 72  hours. ------------------------------------------------------------------------------------------------------------------ Cardiac Enzymes No results for input(s): CKMB, TROPONINI, MYOGLOBIN in the last 168 hours.  Invalid input(s): CK  Micro Results Recent Results (from the past 240 hours)  Resp panel by RT-PCR (RSV, Flu A&B, Covid) Anterior Nasal Swab     Status: None   Collection Time: 01/24/24  3:08 PM   Specimen: Anterior Nasal Swab  Result Value Ref Range Status   SARS Coronavirus 2 by RT PCR NEGATIVE NEGATIVE Final    Comment: (NOTE) SARS-CoV-2 target nucleic acids are NOT DETECTED.  The SARS-CoV-2 RNA is generally detectable in upper respiratory specimens during the acute phase of infection. The lowest concentration of SARS-CoV-2 viral copies this assay can detect is 138 copies/mL. A negative result does not preclude SARS-Cov-2 infection and should not be used as the sole basis for treatment or other patient management decisions. A negative result may occur with  improper specimen collection/handling, submission of specimen other than nasopharyngeal swab, presence of viral mutation(s) within the areas targeted by this assay, and inadequate number of viral copies(<138 copies/mL). A negative result must be combined with clinical observations, patient history, and epidemiological information. The expected result is Negative.  Fact Sheet for Patients:  bloggercourse.com  Fact Sheet for Healthcare Providers:  seriousbroker.it  This test is no t yet approved or cleared by the [place]  FDA and  has been authorized for detection and/or diagnosis of SARS-CoV-2 by FDA under an Emergency Use Authorization (EUA). This EUA will remain  in effect (meaning this test can be used) for the duration of the COVID-19 declaration under Section  564(b)(1) of the Act, 21 U.S.C.section 360bbb-3(b)(1), unless the authorization is  terminated  or revoked sooner.       Influenza A by PCR NEGATIVE NEGATIVE Final   Influenza B by PCR NEGATIVE NEGATIVE Final    Comment: (NOTE) The Xpert Xpress SARS-CoV-2/FLU/RSV plus assay is intended as an aid in the diagnosis of influenza from Nasopharyngeal swab specimens and should not be used as a sole basis for treatment. Nasal washings and aspirates are unacceptable for Xpert Xpress SARS-CoV-2/FLU/RSV testing.  Fact Sheet for Patients: bloggercourse.com  Fact Sheet for Healthcare Providers: seriousbroker.it  This test is not yet approved or cleared by the [place]  FDA and has been authorized for detection and/or diagnosis of SARS-CoV-2 by FDA under an Emergency Use Authorization (EUA). This EUA will remain in effect (meaning this test can be used) for the duration of the COVID-19 declaration under Section 564(b)(1) of the Act, 21 U.S.C. section 360bbb-3(b)(1), unless the authorization is terminated or revoked.     Resp Syncytial Virus by PCR NEGATIVE NEGATIVE Final    Comment: (NOTE) Fact Sheet for Patients: bloggercourse.com  Fact Sheet for Healthcare Providers: seriousbroker.it  This test is not yet approved or cleared by the [place]  FDA and has been authorized for detection and/or diagnosis of SARS-CoV-2 by FDA under an Emergency Use Authorization (EUA). This EUA will remain in effect (meaning this test can be used) for the duration of the COVID-19 declaration under Section 564(b)(1) of the Act, 21 U.S.C. section 360bbb-3(b)(1), unless the authorization is terminated or revoked.  Performed at [facility], [address] [place], [place] [address]   Blood Culture (routine x 2)     Status: None   Collection Time: 01/24/24  3:20 PM   Specimen: BLOOD  Result Value Ref Range Status   Specimen Description BLOOD BLOOD LEFT HAND  Final    Special Requests   Final    Blood Culture results may not be optimal due to an inadequate volume of blood received in culture bottles   Culture   Final    NO GROWTH 5 DAYS Performed at [facility], [address], [place], [place] [address]    Report Status 01/29/2024 FINAL  Final  Blood Culture (routine x 2)     Status: None   Collection Time: 01/24/24  3:20 PM   Specimen: BLOOD  Result Value Ref Range Status   Specimen Description BLOOD BLOOD RIGHT ARM  Final   Special Requests   Final    Blood Culture results may not be optimal due to an inadequate volume of blood received in culture bottles   Culture   Final    NO GROWTH 5 DAYS Performed at [facility], [address] [place], [place] [address]    Report Status 01/29/2024 FINAL  Final  MRSA Next Gen by PCR, Nasal     Status: Abnormal   Collection Time: 01/27/24  6:02 AM   Specimen: Nasal Mucosa; Nasal Swab  Result Value Ref Range Status   MRSA by PCR Next Gen DETECTED (A) NOT DETECTED Final    Comment: RESULT CALLED TO, READ BACK BY AND VERIFIED WITH: RN JESSICA B [id number] FCP (NOTE) The GeneXpert MRSA Assay (FDA approved for NASAL specimens only), is one component of a comprehensive MRSA colonization surveillance program. It is not intended to diagnose MRSA infection nor to guide or monitor treatment for MRSA infections. Test performance is not FDA approved in patients less than 53 years old. Performed  at [facility], [address] [place], [place] [address]   Body fluid culture w Gram Stain     Status: None   Collection Time: 01/27/24 10:18 AM   Specimen: Synovium; Body Fluid  Result Value Ref Range Status   Specimen Description SYNOVIAL  Final   Special Requests Normal  Final   Gram Stain   Final    ABUNDANT WBC PRESENT, PREDOMINANTLY PMN NO ORGANISMS SEEN    Culture   Final    RARE STAPHYLOCOCCUS AUREUS CRITICAL RESULT CALLED TO, READ BACK BY AND VERIFIED WITH: RN MARLA GRAYSON [id number]  [id number] FCP Performed at [facility], [address] [place], [place] [address]    Report Status 01/29/2024 FINAL  Final   Organism ID, Bacteria STAPHYLOCOCCUS AUREUS  Final      Susceptibility   Staphylococcus aureus - MIC*    CIPROFLOXACIN  >=8 RESISTANT Resistant     ERYTHROMYCIN >=8 RESISTANT Resistant     GENTAMICIN <=0.5 SENSITIVE Sensitive     OXACILLIN 0.5 SENSITIVE Sensitive     TETRACYCLINE >=16 RESISTANT Resistant     VANCOMYCIN  <=0.5 SENSITIVE Sensitive     TRIMETH/SULFA <=10 SENSITIVE Sensitive     CLINDAMYCIN >=8 RESISTANT Resistant     RIFAMPIN <=0.5 SENSITIVE Sensitive     Inducible Clindamycin NEGATIVE Sensitive     LINEZOLID  2 SENSITIVE Sensitive     * RARE STAPHYLOCOCCUS AUREUS  Aerobic/Anaerobic Culture w Gram Stain (surgical/deep wound)     Status: None (Preliminary result)   Collection Time: 01/30/24 11:16 AM   Specimen: Soft Tissue, Other  Result Value Ref Range Status   Specimen Description SYNOVIAL  Final   Special Requests RIGHT KNEE SYNOVIAL  Final   Gram Stain   Final    ABUNDANT WBC PRESENT, PREDOMINANTLY PMN NO ORGANISMS SEEN    Culture   Final    NO GROWTH < 24 HOURS Performed at [facility], [address] [place], [place] [address]    Report Status PENDING  Incomplete  Aerobic/Anaerobic Culture w Gram Stain (surgical/deep wound)     Status: None (Preliminary result)   Collection Time: 01/30/24 11:17 AM   Specimen: Soft Tissue, Other  Result Value Ref Range Status   Specimen Description SYNOVIAL  Final   Special Requests RIGHT KNEE SYNOVIUM  Final   Gram Stain   Final    RARE WBC PRESENT, PREDOMINANTLY PMN NO ORGANISMS SEEN    Culture   Final    NO GROWTH < 24 HOURS Performed at [facility], [address] [place], [place] [address]    Report Status PENDING  Incomplete    Radiology Report No results found.   Signature  -   Brayton Lye M.D on 01/31/2024 at 3:14 PM   -  To page go to [email or web address]   "

## 2024-01-31 NOTE — Plan of Care (Signed)

## 2024-01-31 NOTE — Progress Notes (Signed)
 ID brief note Aspirate grew MSSA, transition to cefazolin 

## 2024-01-31 NOTE — Evaluation (Signed)
 Physical Therapy Evaluation Patient Details Name: Joe Coleman MRN: [id number] DOB: 1945-05-15 Today's Date: 01/31/2024  History of Present Illness  Pt is 78 y.o. male transfered from [facility] ED to [place] 01/26/24 due to increased pain, redness, and drainage at prior R LE wound below knee with CT of R LE in [id number] [facility] ED revealing a moderate to large thick-walled joint effusion as well as a comminuted proximal tibia fracture. S/p R knee aspiration on 12/23 and R knee I&D on 12/26. PMHx: T2DM, COPD, HTN, CKD IIIa, BPH, GERD, past tobacco abuse, and hx R tib fx s/p ORIF (7/25), wound infection (8/28), wound dehiscence with surgical hardware visible (9/9), s/p removal of hardware, I&D, skin graft, and wound vac placement (9/10), wound vac change (9/23). Hospitalization 9/9-9/19 for right leg wound with infected hardware d/t Enterobacter & Klebsiella, placed on IV antibiotics as well as acute LLE distal DVT started on Eliquis . Hospitalization 9/24-9/30 with AMS, AKI, and rhabdomyolysis   Clinical Impression  Pt is currently mobilizing below his baseline due to pain, weakness, and fatigue s/p R knee I&D on 12/26. Pt demonstrates difficulty lifting R LE against gravity and requires LE assist for bed mobility. Pt tolerates gentle knee flexion needed to sit EOB. Pt requires modAx2 for STS and step pivot transfer to chair with assist for rolling walker management. Pt demonstrates decreased weight shift on R LE when standing due to pain. Pt educated on WBAT precautions. Pt initially on oxygen on arrival, tolerating well on room air, RN notified. Pt would benefit from continued PT services focused on strength, ROM, transfers, and gait to promote tolerance and independence with functional mobility.        If plan is discharge home, recommend the following: A lot of help with walking and/or transfers;A little help with bathing/dressing/bathroom;Assistance with cooking/housework;Assist for transportation;Help with stairs or  ramp for entrance   Can travel by private vehicle   No    Equipment Recommendations Rolling walker (2 wheels);BSC/3in1  Recommendations for Other Services       Functional Status Assessment Patient has had a recent decline in their functional status and demonstrates the ability to make significant improvements in function in a reasonable and predictable amount of time.     Precautions / Restrictions Precautions Precautions: Fall Recall of Precautions/Restrictions: Intact Restrictions Weight Bearing Restrictions Per Provider Order: Yes RLE Weight Bearing Per Provider Order: Weight bearing as tolerated      Mobility  Bed Mobility Overal bed mobility: Needs Assistance Bed Mobility: Supine to Sit     Supine to sit: Min assist, Mod assist, HOB elevated     General bed mobility comments: assist to manage R LE, with increased time; with cue for hand placement/technique, steady upon sitting EOB, tolerates gentle flexion of knee well to sit EOB    Transfers Overall transfer level: Needs assistance Equipment used: Rolling walker (2 wheels) Transfers: Sit to/from Stand, Bed to chair/wheelchair/BSC Sit to Stand: Mod assist, +2 physical assistance, +2 safety/equipment   Step pivot transfers: Mod assist, +2 physical assistance, +2 safety/equipment       General transfer comment: Mod assist +2 to power up from EOB on lowest setting; cues for hand placement/technique with RW; assist to guide RW during transfer. Weight shifted towards L to offload R LE despite WBAT precautions.    Ambulation/Gait                  Careers Information Officer  Tilt Bed    Modified Rankin (Stroke Patients Only)       Balance Overall balance assessment: Needs assistance Sitting-balance support: No upper extremity supported, Feet supported Sitting balance-Leahy Scale: Good Sitting balance - Comments: No sitting balance concerns. Improved with both feet planted on  the ground.   Standing balance support: Bilateral upper extremity supported, During functional activity, Reliant on assistive device for balance Standing balance-Leahy Scale: Poor Standing balance comment: Reliant on B UE support. Pt with R LE WBAT, but with pt presetning with heavy lean to Left in attempt to offload R LE in standing and stepping. No knee buckling or overt LOB noted during session. Tolerates short bouts of stance due to pain and fatigue.                             Pertinent Vitals/Pain Pain Assessment Pain Assessment: Faces Faces Pain Scale: Hurts even more Pain Location: R knee Pain Descriptors / Indicators: Aching, Grimacing, Guarding Pain Intervention(s): Limited activity within patient's tolerance, Repositioned, Monitored during session    Home Living Family/patient expects to be discharged to:: Private residence Living Arrangements: Alone Available Help at Discharge: Personal care attendant;Available PRN/intermittently;Family (Has a person who comes to clean and do laundry for a few hours every other morning; niece lives nearby and can assist PRN) Type of Home: Apartment Home Access: Stairs to enter Entrance Stairs-Rails: None Entrance Stairs-Number of Steps: 1+1 (curb and then 1 step into apartment)   Home Layout: One level Home Equipment: Agricultural Consultant (2 wheels);Rollator (4 wheels);Wheelchair - manual;BSC/3in1;Shower seat;Grab bars - tub/shower;Hand held shower head Additional Comments: Pt reports his RW is not in good condition    Prior Function Prior Level of Function : Independent/Modified Independent             Mobility Comments: Using a RW ADLs Comments: Ind to Mod I with ADLs, assist with IADLs     Extremity/Trunk Assessment   Upper Extremity Assessment Upper Extremity Assessment: Defer to OT evaluation    Lower Extremity Assessment Lower Extremity Assessment: RLE deficits/detail;LLE deficits/detail RLE Deficits / Details:  Increased swelling, redness, and pain noted in R LE. Unable to lift against gravity for bed mobility. Ankle dorsiflexion/plantarflection WNL. Difficulty weight bearing due to pain. Surgical site bandaged. RLE Sensation: WNL LLE Deficits / Details: LLE Strength WNL LLE Sensation: WNL LLE Coordination: WNL    Cervical / Trunk Assessment Cervical / Trunk Assessment: Normal  Communication   Communication Communication: Impaired Factors Affecting Communication: Reduced clarity of speech    Cognition Arousal: Alert Behavior During Therapy: WFL for tasks assessed/performed   PT - Cognitive impairments: No apparent impairments                         Following commands: Intact       Cueing Cueing Techniques: Verbal cues, Gestural cues, Tactile cues, Visual cues     General Comments General comments (skin integrity, edema, etc.): VSS througout. R LE red with edema. Surgical bandage C,D,I.    Exercises     Assessment/Plan    PT Assessment Patient needs continued PT services  PT Problem List Decreased strength;Decreased mobility;Decreased safety awareness;Decreased range of motion;Decreased coordination;Decreased knowledge of precautions;Decreased activity tolerance;Decreased balance;Decreased knowledge of use of DME;Decreased skin integrity       PT Treatment Interventions DME instruction;Therapeutic exercise;Gait training;Balance training;Stair training;Neuromuscular re-education;Functional mobility training;Therapeutic activities;Patient/family education    PT Goals (Current goals can  be found in the Care Plan section)  Acute Rehab PT Goals Patient Stated Goal: Decrease pain PT Goal Formulation: With patient Time For Goal Achievement: 02/14/24 Potential to Achieve Goals: Good    Frequency Min 2X/week     Co-evaluation PT/OT/SLP Co-Evaluation/Treatment: Yes Reason for Co-Treatment: Complexity of the patient's impairments (multi-system involvement);For  patient/therapist safety;To address functional/ADL transfers PT goals addressed during session: Mobility/safety with mobility;Balance;Proper use of DME OT goals addressed during session: ADL's and self-care;Proper use of Adaptive equipment and DME       AM-PAC PT 6 Clicks Mobility  Outcome Measure Help needed turning from your back to your side while in a flat bed without using bedrails?: None Help needed moving from lying on your back to sitting on the side of a flat bed without using bedrails?: A Little Help needed moving to and from a bed to a chair (including a wheelchair)?: A Lot Help needed standing up from a chair using your arms (e.g., wheelchair or bedside chair)?: A Lot Help needed to walk in hospital room?: Total Help needed climbing 3-5 steps with a railing? : Total 6 Click Score: 13    End of Session   Activity Tolerance: Patient limited by pain;Patient limited by fatigue Patient left: in chair;with call bell/phone within reach;with chair alarm set Nurse Communication: Mobility status PT Visit Diagnosis: Unsteadiness on feet (R26.81);Muscle weakness (generalized) (M62.81);Pain Pain - Right/Left: Right Pain - part of body: Knee    Time: 1335-1407 PT Time Calculation (min) (ACUTE ONLY): 32 min   Charges:   PT Evaluation $PT Eval Moderate Complexity: 1 Mod PT Treatments $Therapeutic Activity: 8-22 mins PT General Charges $$ ACUTE PT VISIT: 1 Visit         Joe Coleman, PT, DPT  Acute Rehabilitation Services         Office: [phone]    Joe Coleman 01/31/2024, 4:07 PM

## 2024-02-01 DIAGNOSIS — M009 Pyogenic arthritis, unspecified: Secondary | ICD-10-CM | POA: Diagnosis not present

## 2024-02-01 LAB — PROCALCITONIN: Procalcitonin: 0.19 ng/mL

## 2024-02-01 LAB — BASIC METABOLIC PANEL WITH GFR
Anion gap: 8 (ref 5–15)
BUN: 30 mg/dL — ABNORMAL HIGH (ref 8–23)
CO2: 25 mmol/L (ref 22–32)
Calcium: 8.1 mg/dL — ABNORMAL LOW (ref 8.9–10.3)
Chloride: 102 mmol/L (ref 98–111)
Creatinine, Ser: 1.58 mg/dL — ABNORMAL HIGH (ref 0.61–1.24)
GFR, Estimated: 44 mL/min — ABNORMAL LOW
Glucose, Bld: 181 mg/dL — ABNORMAL HIGH (ref 70–99)
Potassium: 4.5 mmol/L (ref 3.5–5.1)
Sodium: 135 mmol/L (ref 135–145)

## 2024-02-01 LAB — CBC WITH DIFFERENTIAL/PLATELET
Abs Immature Granulocytes: 0.14 K/uL — ABNORMAL HIGH (ref 0.00–0.07)
Basophils Absolute: 0 K/uL (ref 0.0–0.1)
Basophils Relative: 0 %
Eosinophils Absolute: 0 K/uL (ref 0.0–0.5)
Eosinophils Relative: 0 %
HCT: 27.3 % — ABNORMAL LOW (ref 39.0–52.0)
Hemoglobin: 8.6 g/dL — ABNORMAL LOW (ref 13.0–17.0)
Immature Granulocytes: 1 %
Lymphocytes Relative: 11 %
Lymphs Abs: 1.5 K/uL (ref 0.7–4.0)
MCH: 28 pg (ref 26.0–34.0)
MCHC: 31.5 g/dL (ref 30.0–36.0)
MCV: 88.9 fL (ref 80.0–100.0)
Monocytes Absolute: 0.9 K/uL (ref 0.1–1.0)
Monocytes Relative: 7 %
Neutro Abs: 10.9 K/uL — ABNORMAL HIGH (ref 1.7–7.7)
Neutrophils Relative %: 81 %
Platelets: 290 K/uL (ref 150–400)
RBC: 3.07 MIL/uL — ABNORMAL LOW (ref 4.22–5.81)
RDW: 14.6 % (ref 11.5–15.5)
WBC: 13.5 K/uL — ABNORMAL HIGH (ref 4.0–10.5)
nRBC: 0 % (ref 0.0–0.2)

## 2024-02-01 LAB — VITAMIN A: Vitamin A (Retinoic Acid): 13.1 ug/dL — ABNORMAL LOW (ref 22.0–69.5)

## 2024-02-01 LAB — GLUCOSE, CAPILLARY
Glucose-Capillary: 158 mg/dL — ABNORMAL HIGH (ref 70–99)
Glucose-Capillary: 128 mg/dL — ABNORMAL HIGH (ref 70–99)
Glucose-Capillary: 127 mg/dL — ABNORMAL HIGH (ref 70–99)
Glucose-Capillary: 183 mg/dL — ABNORMAL HIGH (ref 70–99)

## 2024-02-01 MED ORDER — CALCIUM CARBONATE ANTACID 500 MG PO CHEW
1.0000 | CHEWABLE_TABLET | Freq: Once | ORAL | Status: AC | PRN
  Administered 2024-02-01: 200 mg via ORAL
  Filled 2024-02-01: qty 1

## 2024-02-01 MED ORDER — ALUM & MAG HYDROXIDE-SIMETH 200-200-20 MG/5ML PO SUSP
30.0000 mL | ORAL | Status: DC | PRN
  Administered 2024-02-01: 30 mL via ORAL
  Filled 2024-02-01: qty 30

## 2024-02-01 MED ORDER — INSULIN GLARGINE 100 UNIT/ML ~~LOC~~ SOLN
5.0000 [IU] | Freq: Every day | SUBCUTANEOUS | Status: DC
  Administered 2024-02-01 – 2024-02-03 (×3): 5 [IU] via SUBCUTANEOUS
  Filled 2024-02-01 (×2): qty 0.05

## 2024-02-01 NOTE — Plan of Care (Signed)

## 2024-02-01 NOTE — Progress Notes (Signed)
 Orthopaedic Progress Note  S: Patient resting calmly bed.  Continues with pain in his right knee  O:  Vitals:   02/01/24 0400 02/01/24 0800  BP: (!) 114/58 129/74  Pulse: 61 64  Resp: 15 19  Temp: (!) 97.3 F (36.3 C) 97.6 F (36.4 C)  SpO2: 96% 96%    Clean dressing on the right knee.  No erythema swelling or effusions are noted.  Intact station saphenous, sural, tibial, peroneal nerve distributions 4/5 strength EHL and FHL  Intraoperative cultures continue show no growth to date.  Aspiration from prior surgery shows Staph aureus  Labs:  Results for orders placed or performed during the hospital encounter of 01/26/24 (from the past 24 hours)  Glucose, capillary     Status: Abnormal   Collection Time: 01/31/24 12:40 PM  Result Value Ref Range   Glucose-Capillary 221 (H) 70 - 99 mg/dL  Glucose, capillary     Status: Abnormal   Collection Time: 01/31/24  4:19 PM  Result Value Ref Range   Glucose-Capillary 218 (H) 70 - 99 mg/dL  Glucose, capillary     Status: Abnormal   Collection Time: 01/31/24  9:09 PM  Result Value Ref Range   Glucose-Capillary 264 (H) 70 - 99 mg/dL  CBC with Differential/Platelet     Status: Abnormal   Collection Time: 02/01/24  5:40 AM  Result Value Ref Range   WBC 13.5 (H) 4.0 - 10.5 K/uL   RBC 3.07 (L) 4.22 - 5.81 MIL/uL   Hemoglobin 8.6 (L) 13.0 - 17.0 g/dL   HCT 72.6 (L) 60.9 - 47.9 %   MCV 88.9 80.0 - 100.0 fL   MCH 28.0 26.0 - 34.0 pg   MCHC 31.5 30.0 - 36.0 g/dL   RDW 85.3 88.4 - 84.4 %   Platelets 290 150 - 400 K/uL   nRBC 0.0 0.0 - 0.2 %   Neutrophils Relative % 81 %   Neutro Abs 10.9 (H) 1.7 - 7.7 K/uL   Lymphocytes Relative 11 %   Lymphs Abs 1.5 0.7 - 4.0 K/uL   Monocytes Relative 7 %   Monocytes Absolute 0.9 0.1 - 1.0 K/uL   Eosinophils Relative 0 %   Eosinophils Absolute 0.0 0.0 - 0.5 K/uL   Basophils Relative 0 %   Basophils Absolute 0.0 0.0 - 0.1 K/uL   Immature Granulocytes 1 %   Abs Immature Granulocytes 0.14 (H) 0.00 -  0.07 K/uL  Basic metabolic panel with GFR     Status: Abnormal   Collection Time: 02/01/24  5:40 AM  Result Value Ref Range   Sodium 135 135 - 145 mmol/L   Potassium 4.5 3.5 - 5.1 mmol/L   Chloride 102 98 - 111 mmol/L   CO2 25 22 - 32 mmol/L   Glucose, Bld 181 (H) 70 - 99 mg/dL   BUN 30 (H) 8 - 23 mg/dL   Creatinine, Ser 8.41 (H) 0.61 - 1.24 mg/dL   Calcium  8.1 (L) 8.9 - 10.3 mg/dL   GFR, Estimated 44 (L) >60 mL/min   Anion gap 8 5 - 15  Procalcitonin     Status: None   Collection Time: 02/01/24  5:40 AM  Result Value Ref Range   Procalcitonin 0.19 ng/mL  Glucose, capillary     Status: Abnormal   Collection Time: 02/01/24  8:49 AM  Result Value Ref Range   Glucose-Capillary 158 (H) 70 - 99 mg/dL    Assessment: Postop day 2 status post I&D right septic knee  Patient  is doing relatively well.  Continues to have pain but this is expected.  Will continue with IV antibiotics per medicine/ID.  He may work with physical therapy.  He may weight-bear as tolerated.  He can resume his home Eliquis  for DVT prophylaxis.     Cordella Rhein, MD, MS [facility] / [place] [phone]

## 2024-02-01 NOTE — Progress Notes (Signed)
 "                                                                                                                                                                                                                                                                                PROGRESS NOTE     Patient Demographics:    Joe Coleman, is a 78 y.o. male, DOB - Apr 07, 1945, FMW:[id number]  Outpatient Primary MD for the patient is [facility]    LOS - 6  Admit date - 01/26/2024    No chief complaint on file.      Brief Narrative (HPI from H&P)     78 y.o. male with medical history significant of diabetes mellitus 2 on metformin , BPH on Flomax , CKD 3A, hypertension, GERD, COPD and history of DVT on Eliquis .  Patient was hospitalized back in September 2025 due to infected hardware in the right knee.  He underwent removal of hardware.  Wound cultures at that time positive for pansensitive Enterobacter and Klebsiella.  He was discharged home on linezolid  and doxycycline  as well as 25 days of IV ertapenem  to treat underlying osteomyelitis.  Readmitted to the hospital in November 2025 due to sepsis secondary to pneumonia.  He was discharged on Augmentin  as well as supportive care.    He states several days before he presented this time he followed up with his orthopedic team and follow-up surgical intervention was recommended.  He states he does have some memory issues and this may not be an accurate assessment.  He reports about 2 days ago he began having significant increase in pain and drainage and increased redness at the prior right lower extremity wound below the knee.  He had run out of his prior Percocet and was having difficulty managing his pain.  He presented to the ED where CT of the knee and lower extremity revealed a moderate to large thick-walled joint effusion as well as a comminuted proximal tibia fracture.  He was evaluated by Dr. Cristy with orthopedic team at [facility] and due to the complexity of his case it was recommended that he be transferred to [facility]    Subjective:  Reports postop pain which is expected, otherwise denies any complaints.    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  Cultures during that admission were positive for pansensitive Enterobacter and Klebsiella.   - Antibiotics management per ID  - Hepatic input greatly appreciated, status post arthrocentesis 12/23, with cell count of 69,000, plan for I&D of right knee by [name], went for I&D 01/30/2024. -Intraoperative cultures with no growth seen, but arthrocentesis fluid growing MSSA, antibiotic management per ID, currently on cefazolin . - Postop recommendation per orthopedic, patient can work with PT, OT, okay to resume his Eliquis , weightbearing as tolerated    AKI on CKD IIIa baseline creatinine around 1.3, gentle hydration IV fluids, monitor bladder scans and BMP.   History of DVT on Eliquis ,  Initially on full dose Lovenox  in anticipation for surgery, now postop he is back on Eliquis .  Anemia - Transfuse 1 unit PRBC for hemoglobin of 7.6, hemoglobin stable posttransfusion  BPH Continue Flomax    Hypertension placed on low-dose Coreg .   COPD  Continue as needed albuterol  neb, On Trelegy at home-therapeutic substitution with Breztri , stable.   GERD  Continue PPI   Severe protein calorie malnutrition Current BMI 19.49, add protein supplement, will consult nutritionist  Hypophosphatemia - Replaced  Diabetes mellitus 2 - SSI.  Remains elevated so we will start on low-dose Semglee   Lab Results  Component Value Date   HGBA1C 6.0 (H) 01/25/2024   CBG (last 3)  Recent Labs    01/31/24 2109 02/01/24 0849 02/01/24 1217  GLUCAP 264* 158* 128*       Condition - Extremely  Guarded  Family Communication  : None at bedside  Code Status : Full code  Consults  : Orthopedics, ID  PUD Prophylaxis : PPI   Procedures  :      CT 01/24/24 - R Knee -   1. Knee arthroplasty in expected alignment. Comminuted proximal tibial fracture extends to the lateral aspect of the tibial tray as well as the cement adjacent to the tibial stem. Tibial metaphyseal and tibial shaft component demonstrates persistent fracture lucency and no bony bridging, suspicious for delayed/nonunion. 2. Proximal fibular fracture has incomplete surrounding callus formation. 3. Moderate to large thick-walled joint effusion with synovial enhancement. This may be inflammatory or infectious, consider fluid sampling. 4. Generalized soft tissue edema. No focal fluid collection. No soft tissue gas.      Disposition Plan  :    Status is: Inpatient   DVT Prophylaxis  : On Eliquis     Lab Results  Component Value Date   PLT 290 02/01/2024    Diet :  Diet Order             Diet Carb Modified Room service appropriate? Yes  Diet effective now                    Inpatient Medications  Scheduled Meds:  (feeding supplement) PROSource Plus  30 mL Oral BID BM   apixaban   5 mg Oral BID   budesonide -glycopyrrolate -formoterol   2 puff Inhalation BID   carvedilol   3.125 mg Oral BID WC   vitamin D3  1,000 Units Oral Daily   cyanocobalamin   1,000 mcg Subcutaneous Daily   Followed by   [name] ON 02/05/2024] cyanocobalamin   1,000 mcg Subcutaneous Q30 days   docusate sodium   100 mg Oral BID   feeding supplement (GLUCERNA SHAKE)  237 mL Oral TID BM   gabapentin   300 mg Oral QHS   insulin  aspart  0-5 Units Subcutaneous QHS   insulin  aspart  0-9 Units Subcutaneous TID WC   insulin  glargine  5 Units Subcutaneous Daily   multivitamin with minerals  1 tablet Oral Daily   pantoprazole   40 mg Oral Daily   polyethylene glycol  17 g Oral Daily   sodium chloride  flush  3 mL Intravenous Q12H   tamsulosin    0.4 mg Oral Daily   thiamine   100 mg Per Tube Daily   Vitamin D  (Ergocalciferol )  50,000 Units Oral Q7 days   Continuous Infusions:   ceFAZolin  (ANCEF ) IV 2 g (02/01/24 0645)   PRN Meds:.acetaminophen  **OR** [DISCONTINUED] acetaminophen , melatonin, methocarbamol , metoCLOPramide  **OR** metoCLOPramide  (REGLAN ) injection, morphine  injection, ondansetron  **OR** ondansetron  (ZOFRAN ) IV, oxyCODONE , polyethylene glycol  Antibiotics  :    Anti-infectives (From admission, onward)    Start     Dose/Rate Route Frequency Ordered Stop   01/30/24 1430  ceFAZolin  (ANCEF ) IVPB 2g/100 mL premix        2 g 200 mL/hr over 30 Minutes Intravenous Every 8 hours 01/30/24 1344     01/30/24 1129  tobramycin  (NEBCIN ) powder  Status:  Discontinued          As needed 01/30/24 1149 01/30/24 1149   01/30/24 1129  vancomycin  (VANCOCIN ) powder  Status:  Discontinued          As needed 01/30/24 1149 01/30/24 1149   01/27/24 1800  DAPTOmycin  (CUBICIN ) IVPB 500 mg/47mL premix  Status:  Discontinued        8 mg/kg  59 kg 100 mL/hr over 30 Minutes Intravenous Daily 01/27/24 1036 01/30/24 1344   01/27/24 1600  ertapenem  (INVANZ ) 1 g in sodium chloride  0.9 % 100 mL IVPB  Status:  Discontinued        1 g 200 mL/hr over 30 Minutes Intravenous Every 24 hours 01/27/24 1036 01/30/24 1344   01/27/24 0915  ceFEPIme  (MAXIPIME ) 2 g in sodium chloride  0.9 % 100 mL IVPB  Status:  Discontinued        2 g 200 mL/hr over 30 Minutes Intravenous Every 12 hours 01/27/24 0825 01/27/24 1036   01/27/24 0500  vancomycin  (VANCOREADY) IVPB 750 mg/150 mL  Status:  Discontinued        750 mg 150 mL/hr over 60 Minutes Intravenous Every 24 hours 01/26/24 1616 01/27/24 1036   01/26/24 2200  cefTRIAXone  (ROCEPHIN ) 2 g in sodium chloride  0.9 % 100 mL IVPB  Status:  Discontinued        2 g 200 mL/hr over 30 Minutes Intravenous Daily at bedtime 01/26/24 1616 01/27/24 0825   01/26/24 1700  metroNIDAZOLE  (FLAGYL ) IVPB 500 mg  Status:  Discontinued         500 mg 100 mL/hr over 60 Minutes Intravenous Every 12 hours 01/26/24 1605 01/27/24 1036         Objective:   Vitals:   02/01/24 0200 02/01/24 0400 02/01/24 0800 02/01/24 1200  BP:  (!) 114/58 129/74 105/60  Pulse: 63 61 64 71  Resp: (!) 21 15 19 19   Temp:  (!) 97.3 F (36.3 C) 97.6 F (36.4 C) (!) 97.4 F (36.3 C)  TempSrc:  Axillary Oral Oral  SpO2: 96% 96% 96% 95%  Weight:      Height:        Wt Readings from Last 3 Encounters:  01/30/24 61.2 kg  01/24/24 59 kg  12/20/23 59 kg     Intake/Output Summary (Last 24 hours) at 02/01/2024 1435 Last data filed at 02/01/2024 0448 Gross per 24 hour  Intake --  Output 200 ml  Net -200 ml     Physical Exam  Awake Alert, Oriented X 3, frail, chronically ill-appearing Good air entry bilaterally Regular rate and rhythm Abdomen soft Right lower extremity bandaged       Data Review:    Recent Labs  Lab 01/28/24 0150 01/29/24 0227 01/30/24 0532 01/31/24 0546 02/01/24 0540  WBC 11.0* 8.8 7.9 10.2 13.5*  HGB 8.1* 8.0* 7.6* 9.2* 8.6*  HCT 26.1* 25.9* 25.1* 29.1* 27.3*  PLT 244 242 258 276 290  MCV 88.2 88.1 89.0 87.7 88.9  MCH 27.4 27.2 27.0 27.7 28.0  MCHC 31.0 30.9 30.3 31.6 31.5  RDW 14.4 14.5 14.6 14.1 14.6  LYMPHSABS 0.8 0.9 1.1 0.7 1.5  MONOABS 1.2* 1.0 0.9 0.4 0.9  EOSABS 0.1 0.2 0.2 0.0 0.0  BASOSABS 0.0 0.0 0.0 0.0 0.0    Recent Labs  Lab 01/25/24 1825 01/26/24 0500 01/27/24 0542 01/27/24 1309 01/28/24 0150 01/29/24 0227 01/30/24 0532 01/31/24 0546 02/01/24 0540  NA  --    < >  --   --  133* 135 138 136 135  K  --    < >  --   --  4.0 3.9 4.4 4.7 4.5  CL  --    < >  --   --  103 103 105 104 102  CO2  --    < >  --   --  18* 24 26 23 25   ANIONGAP  --    < >  --   --  13 8 7 9 8   GLUCOSE  --    < >  --   --  132* 171* 123* 314* 181*  BUN  --    < >  --   --  23 20 22  26* 30*  CREATININE  --    < >  --   --  1.54* 1.40* 1.42* 1.43* 1.58*  CRP  --   --  30.3*  --  25.9* 18.6* 15.5*  11.9*  --   PROCALCITON  --    < > 1.38  --  0.96 0.55 0.35 0.24 0.19  HGBA1C 6.0*  --   --   --   --   --   --   --   --   MG  --   --   --  1.9 1.9 2.0 2.0 2.1  --   PHOS  --   --   --  3.0 2.4* 2.2* 3.3  --   --   CALCIUM   --    < >  --   --  8.6* 8.4* 8.4* 8.4* 8.1*   < > = values in this interval not displayed.      Recent Labs  Lab 01/25/24 1825 01/26/24 0500 01/27/24 0542 01/27/24 1309 01/28/24 0150 01/29/24 0227 01/30/24 0532 01/31/24 0546 02/01/24 0540  CRP  --   --  30.3*  --  25.9* 18.6* 15.5* 11.9*  --   PROCALCITON  --    < > 1.38  --  0.96 0.55 0.35 0.24 0.19  HGBA1C 6.0*  --   --   --   --   --   --   --   --   MG  --   --   --  1.9 1.9 2.0 2.0 2.1  --   CALCIUM   --    < >  --   --  8.6* 8.4* 8.4* 8.4* 8.1*   < > = values in this interval not displayed.    --------------------------------------------------------------------------------------------------------------- No results found for: CHOL, HDL, LDLCALC, LDLDIRECT, TRIG, CHOLHDL  Lab Results  Component Value Date   HGBA1C 6.0 (H) 01/25/2024   No results for input(s): TSH, T4TOTAL, FREET4, T3FREE, THYROIDAB in the last 72 hours. No results for input(s): VITAMINB12, FOLATE, FERRITIN, TIBC, IRON , RETICCTPCT in the last 72 hours. ------------------------------------------------------------------------------------------------------------------ Cardiac Enzymes No results for input(s): CKMB, TROPONINI, MYOGLOBIN in the last 168 hours.  Invalid input(s): CK  Micro Results Recent Results (from the past 240 hours)  Resp panel by RT-PCR (RSV, Flu A&B, Covid) Anterior Nasal Swab     Status: None   Collection Time: 01/24/24  3:08 PM   Specimen: Anterior Nasal Swab  Result Value Ref Range Status   SARS Coronavirus 2 by RT PCR NEGATIVE NEGATIVE Final    Comment: (NOTE) SARS-CoV-2 target nucleic acids are NOT DETECTED.  The SARS-CoV-2 RNA is generally detectable in  upper respiratory specimens during the acute phase of infection. The lowest concentration of SARS-CoV-2 viral copies this assay can detect is 138 copies/mL. A negative result does not preclude SARS-Cov-2 infection and should not be used as the sole basis for treatment or other patient management decisions. A negative result may occur with  improper specimen collection/handling, submission of specimen other than nasopharyngeal swab, presence of viral mutation(s) within the areas targeted by this assay, and inadequate number of viral copies(<138 copies/mL). A negative result must be combined with clinical observations, patient history, and epidemiological information. The expected result is Negative.  Fact Sheet for Patients:  bloggercourse.com  Fact Sheet for Healthcare Providers:  seriousbroker.it  This test is no t yet approved or cleared by the [place]  FDA and  has been authorized for detection and/or diagnosis of SARS-CoV-2 by FDA under an Emergency Use Authorization (EUA). This EUA will remain  in effect (meaning this test can be used) for the duration of the COVID-19 declaration under Section 564(b)(1) of the Act, 21 U.S.C.section 360bbb-3(b)(1), unless the authorization is terminated  or revoked sooner.       Influenza A by PCR NEGATIVE NEGATIVE Final   Influenza B by PCR NEGATIVE NEGATIVE Final    Comment: (NOTE) The Xpert Xpress SARS-CoV-2/FLU/RSV plus assay is intended as an aid in the diagnosis of influenza from Nasopharyngeal swab specimens and should not be used as a sole basis for treatment. Nasal washings and aspirates are unacceptable for Xpert Xpress SARS-CoV-2/FLU/RSV testing.  Fact Sheet for Patients: bloggercourse.com  Fact Sheet for Healthcare Providers: seriousbroker.it  This test is not yet approved or cleared by the [place]  FDA and has been  authorized for detection and/or diagnosis of SARS-CoV-2 by FDA under an Emergency Use Authorization (EUA). This EUA will remain in effect (meaning this test can be used) for the duration of the COVID-19 declaration under Section 564(b)(1) of the Act, 21 U.S.C. section 360bbb-3(b)(1), unless the authorization is terminated or revoked.     Resp Syncytial Virus by PCR NEGATIVE NEGATIVE Final    Comment: (NOTE) Fact Sheet for Patients: bloggercourse.com  Fact Sheet for Healthcare Providers: seriousbroker.it  This test is not yet approved or cleared by the [place]  FDA and has been authorized for detection and/or diagnosis of SARS-CoV-2 by FDA under an Emergency Use Authorization (EUA). This EUA will remain  in effect (meaning this test can be used) for the duration of the COVID-19 declaration under Section 564(b)(1) of the Act, 21 U.S.C. section 360bbb-3(b)(1), unless the authorization is terminated or revoked.  Performed at [facility], [address] [place], [place] [address]   Blood Culture (routine x 2)     Status: None   Collection Time: 01/24/24  3:20 PM   Specimen: BLOOD  Result Value Ref Range Status   Specimen Description BLOOD BLOOD LEFT HAND  Final   Special Requests   Final    Blood Culture results may not be optimal due to an inadequate volume of blood received in culture bottles   Culture   Final    NO GROWTH 5 DAYS Performed at [facility], [address], [place], [place] [address]    Report Status 01/29/2024 FINAL  Final  Blood Culture (routine x 2)     Status: None   Collection Time: 01/24/24  3:20 PM   Specimen: BLOOD  Result Value Ref Range Status   Specimen Description BLOOD BLOOD RIGHT ARM  Final   Special Requests   Final    Blood Culture results may not be optimal due to an inadequate volume of blood received in culture bottles   Culture   Final    NO GROWTH 5  DAYS Performed at [facility], [address] [place], [place] [address]    Report Status 01/29/2024 FINAL  Final  MRSA Next Gen by PCR, Nasal     Status: Abnormal   Collection Time: 01/27/24  6:02 AM   Specimen: Nasal Mucosa; Nasal Swab  Result Value Ref Range Status   MRSA by PCR Next Gen DETECTED (A) NOT DETECTED Final    Comment: RESULT CALLED TO, READ BACK BY AND VERIFIED WITH: RN JESSICA B [id number] FCP (NOTE) The GeneXpert MRSA Assay (FDA approved for NASAL specimens only), is one component of a comprehensive MRSA colonization surveillance program. It is not intended to diagnose MRSA infection nor to guide or monitor treatment for MRSA infections. Test performance is not FDA approved in patients less than 65 years old. Performed at [facility], [address], [place], [place] [address]   Body fluid culture w Gram Stain     Status: None   Collection Time: 01/27/24 10:18 AM   Specimen: Synovium; Body Fluid  Result Value Ref Range Status   Specimen Description SYNOVIAL  Final   Special Requests Normal  Final   Gram Stain   Final    ABUNDANT WBC PRESENT, PREDOMINANTLY PMN NO ORGANISMS SEEN    Culture   Final    RARE STAPHYLOCOCCUS AUREUS CRITICAL RESULT CALLED TO, READ BACK BY AND VERIFIED WITH: RN MARLA GRAYSON [phone] FCP Performed at [facility], [address], [place], [place] [address]    Report Status 01/29/2024 FINAL  Final   Organism ID, Bacteria STAPHYLOCOCCUS AUREUS  Final      Susceptibility   Staphylococcus aureus - MIC*    CIPROFLOXACIN  >=8 RESISTANT Resistant     ERYTHROMYCIN >=8 RESISTANT Resistant     GENTAMICIN <=0.5 SENSITIVE Sensitive     OXACILLIN 0.5 SENSITIVE Sensitive     TETRACYCLINE >=16 RESISTANT Resistant     VANCOMYCIN  <=0.5 SENSITIVE Sensitive     TRIMETH/SULFA <=10 SENSITIVE Sensitive     CLINDAMYCIN >=8 RESISTANT Resistant     RIFAMPIN <=0.5 SENSITIVE Sensitive     Inducible Clindamycin NEGATIVE Sensitive  LINEZOLID  2 SENSITIVE Sensitive     * RARE STAPHYLOCOCCUS AUREUS  Aerobic/Anaerobic Culture w Gram Stain (surgical/deep wound)     Status: None (Preliminary result)   Collection Time: 01/30/24 11:16 AM   Specimen: Soft Tissue, Other  Result Value Ref Range Status   Specimen Description SYNOVIAL  Final   Special Requests RIGHT KNEE SYNOVIAL  Final   Gram Stain   Final    ABUNDANT WBC PRESENT, PREDOMINANTLY PMN NO ORGANISMS SEEN    Culture   Final    NO GROWTH 2 DAYS NO ANAEROBES ISOLATED; CULTURE IN PROGRESS FOR 5 DAYS Performed at [facility], [address] [place], [place] [address]    Report Status PENDING  Incomplete  Aerobic/Anaerobic Culture w Gram Stain (surgical/deep wound)     Status: None (Preliminary result)   Collection Time: 01/30/24 11:17 AM   Specimen: Soft Tissue, Other  Result Value Ref Range Status   Specimen Description SYNOVIAL  Final   Special Requests RIGHT KNEE SYNOVIUM  Final   Gram Stain   Final    RARE WBC PRESENT, PREDOMINANTLY PMN NO ORGANISMS SEEN    Culture   Final    NO GROWTH 2 DAYS NO ANAEROBES ISOLATED; CULTURE IN PROGRESS FOR 5 DAYS Performed at [facility], [address] [place], [place] [address]    Report Status PENDING  Incomplete    Radiology Report No results found.   Signature  -   Brayton Lye M.D on 02/01/2024 at 2:35 PM   -  To page go to [email or web address]   "

## 2024-02-01 NOTE — Plan of Care (Signed)
 " Problem: Education: Goal: Knowledge of General Education information will improve Description: Including pain rating scale, medication(s)/side effects and non-pharmacologic comfort measures 02/01/2024 0701 by Elnor Zachary CROME, RN Outcome: Progressing 02/01/2024 0700 by Elnor Zachary CROME, RN Outcome: Progressing   Problem: Health Behavior/Discharge Planning: Goal: Ability to manage health-related needs will improve 02/01/2024 0701 by Elnor Zachary CROME, RN Outcome: Progressing 02/01/2024 0700 by Elnor Zachary CROME, RN Outcome: Progressing   Problem: Clinical Measurements: Goal: Ability to maintain clinical measurements within normal limits will improve 02/01/2024 0701 by Elnor Zachary CROME, RN Outcome: Progressing 02/01/2024 0700 by Elnor Zachary CROME, RN Outcome: Progressing Goal: Will remain free from infection 02/01/2024 0701 by Elnor Zachary CROME, RN Outcome: Progressing 02/01/2024 0700 by Elnor Zachary CROME, RN Outcome: Progressing Goal: Diagnostic test results will improve 02/01/2024 0701 by Elnor Zachary CROME, RN Outcome: Progressing 02/01/2024 0700 by Elnor Zachary CROME, RN Outcome: Progressing Goal: Respiratory complications will improve 02/01/2024 0701 by Elnor Zachary CROME, RN Outcome: Progressing 02/01/2024 0700 by Elnor Zachary CROME, RN Outcome: Progressing Goal: Cardiovascular complication will be avoided 02/01/2024 0701 by Elnor Zachary CROME, RN Outcome: Progressing 02/01/2024 0700 by Elnor Zachary CROME, RN Outcome: Progressing   Problem: Activity: Goal: Risk for activity intolerance will decrease 02/01/2024 0701 by Elnor Zachary CROME, RN Outcome: Progressing 02/01/2024 0700 by Elnor Zachary CROME, RN Outcome: Progressing   Problem: Nutrition: Goal: Adequate nutrition will be maintained 02/01/2024 0701 by Elnor Zachary CROME, RN Outcome: Progressing 02/01/2024 0700 by Elnor Zachary CROME, RN Outcome: Progressing   Problem: Coping: Goal: Level of anxiety will decrease 02/01/2024 0701 by Elnor Zachary CROME, RN Outcome:  Progressing 02/01/2024 0700 by Elnor Zachary CROME, RN Outcome: Progressing   Problem: Elimination: Goal: Will not experience complications related to bowel motility 02/01/2024 0701 by Elnor Zachary CROME, RN Outcome: Progressing 02/01/2024 0700 by Elnor Zachary CROME, RN Outcome: Progressing Goal: Will not experience complications related to urinary retention 02/01/2024 0701 by Elnor Zachary CROME, RN Outcome: Progressing 02/01/2024 0700 by Elnor Zachary CROME, RN Outcome: Progressing   Problem: Pain Managment: Goal: General experience of comfort will improve and/or be controlled 02/01/2024 0701 by Elnor Zachary CROME, RN Outcome: Progressing 02/01/2024 0700 by Elnor Zachary CROME, RN Outcome: Progressing   Problem: Safety: Goal: Ability to remain free from injury will improve 02/01/2024 0701 by Elnor Zachary CROME, RN Outcome: Progressing 02/01/2024 0700 by Elnor Zachary CROME, RN Outcome: Progressing   Problem: Skin Integrity: Goal: Risk for impaired skin integrity will decrease 02/01/2024 0701 by Elnor Zachary CROME, RN Outcome: Progressing 02/01/2024 0700 by Elnor Zachary CROME, RN Outcome: Progressing   Problem: Education: Goal: Ability to describe self-care measures that may prevent or decrease complications (Diabetes Survival Skills Education) will improve 02/01/2024 0701 by Elnor Zachary CROME, RN Outcome: Progressing 02/01/2024 0700 by Elnor Zachary CROME, RN Outcome: Progressing Goal: Individualized Educational Video(s) 02/01/2024 0701 by Elnor Zachary CROME, RN Outcome: Progressing 02/01/2024 0700 by Elnor Zachary CROME, RN Outcome: Progressing   Problem: Coping: Goal: Ability to adjust to condition or change in health will improve 02/01/2024 0701 by Elnor Zachary CROME, RN Outcome: Progressing 02/01/2024 0700 by Elnor Zachary CROME, RN Outcome: Progressing   Problem: Fluid Volume: Goal: Ability to maintain a balanced intake and output will improve 02/01/2024 0701 by Elnor Zachary CROME, RN Outcome: Progressing 02/01/2024 0700 by  Elnor Zachary CROME, RN Outcome: Progressing   Problem: Health Behavior/Discharge Planning: Goal: Ability to identify and utilize available resources and services will improve 02/01/2024 0701 by Elnor Zachary CROME, RN Outcome: Progressing 02/01/2024 0700  by Elnor Zachary CROME, RN Outcome: Progressing Goal: Ability to manage health-related needs will improve 02/01/2024 0701 by Elnor Zachary CROME, RN Outcome: Progressing 02/01/2024 0700 by Elnor Zachary CROME, RN Outcome: Progressing   Problem: Metabolic: Goal: Ability to maintain appropriate glucose levels will improve 02/01/2024 0701 by Elnor Zachary CROME, RN Outcome: Progressing 02/01/2024 0700 by Elnor Zachary CROME, RN Outcome: Progressing   Problem: Nutritional: Goal: Maintenance of adequate nutrition will improve 02/01/2024 0701 by Elnor Zachary CROME, RN Outcome: Progressing 02/01/2024 0700 by Elnor Zachary CROME, RN Outcome: Progressing Goal: Progress toward achieving an optimal weight will improve 02/01/2024 0701 by Elnor Zachary CROME, RN Outcome: Progressing 02/01/2024 0700 by Elnor Zachary CROME, RN Outcome: Progressing   Problem: Skin Integrity: Goal: Risk for impaired skin integrity will decrease 02/01/2024 0701 by Elnor Zachary CROME, RN Outcome: Progressing 02/01/2024 0700 by Elnor Zachary CROME, RN Outcome: Progressing   Problem: Tissue Perfusion: Goal: Adequacy of tissue perfusion will improve 02/01/2024 0701 by Elnor Zachary CROME, RN Outcome: Progressing 02/01/2024 0700 by Elnor Zachary CROME, RN Outcome: Progressing   "

## 2024-02-02 ENCOUNTER — Other Ambulatory Visit: Payer: Self-pay

## 2024-02-02 ENCOUNTER — Other Ambulatory Visit (HOSPITAL_COMMUNITY): Payer: Self-pay

## 2024-02-02 DIAGNOSIS — M009 Pyogenic arthritis, unspecified: Secondary | ICD-10-CM | POA: Diagnosis not present

## 2024-02-02 LAB — CBC
HCT: 30.5 % — ABNORMAL LOW (ref 39.0–52.0)
Hemoglobin: 9.3 g/dL — ABNORMAL LOW (ref 13.0–17.0)
MCH: 27.3 pg (ref 26.0–34.0)
MCHC: 30.5 g/dL (ref 30.0–36.0)
MCV: 89.4 fL (ref 80.0–100.0)
Platelets: 365 K/uL (ref 150–400)
RBC: 3.41 MIL/uL — ABNORMAL LOW (ref 4.22–5.81)
RDW: 14.6 % (ref 11.5–15.5)
WBC: 11.8 K/uL — ABNORMAL HIGH (ref 4.0–10.5)
nRBC: 0 % (ref 0.0–0.2)

## 2024-02-02 LAB — BASIC METABOLIC PANEL WITH GFR
Anion gap: 6 (ref 5–15)
BUN: 28 mg/dL — ABNORMAL HIGH (ref 8–23)
CO2: 29 mmol/L (ref 22–32)
Calcium: 8.5 mg/dL — ABNORMAL LOW (ref 8.9–10.3)
Chloride: 101 mmol/L (ref 98–111)
Creatinine, Ser: 1.45 mg/dL — ABNORMAL HIGH (ref 0.61–1.24)
GFR, Estimated: 49 mL/min — ABNORMAL LOW
Glucose, Bld: 148 mg/dL — ABNORMAL HIGH (ref 70–99)
Potassium: 4.7 mmol/L (ref 3.5–5.1)
Sodium: 136 mmol/L (ref 135–145)

## 2024-02-02 LAB — GLUCOSE, CAPILLARY
Glucose-Capillary: 149 mg/dL — ABNORMAL HIGH (ref 70–99)
Glucose-Capillary: 151 mg/dL — ABNORMAL HIGH (ref 70–99)
Glucose-Capillary: 107 mg/dL — ABNORMAL HIGH (ref 70–99)
Glucose-Capillary: 159 mg/dL — ABNORMAL HIGH (ref 70–99)

## 2024-02-02 MED ORDER — SODIUM CHLORIDE 0.9% FLUSH
10.0000 mL | Freq: Two times a day (BID) | INTRAVENOUS | Status: DC
  Administered 2024-02-02: 10 mL

## 2024-02-02 MED ORDER — SODIUM CHLORIDE 0.9% FLUSH: 10.0000 mL | INTRAVENOUS | Status: DC | PRN

## 2024-02-02 MED ORDER — GUAIFENESIN-DM 100-10 MG/5ML PO SYRP
5.0000 mL | ORAL_SOLUTION | ORAL | Status: DC | PRN
  Administered 2024-02-02 – 2024-02-03 (×3): 5 mL via ORAL
  Filled 2024-02-02 (×3): qty 5

## 2024-02-02 MED ORDER — OXYCODONE HCL 5 MG PO TABS
5.0000 mg | ORAL_TABLET | ORAL | Status: DC | PRN
  Administered 2024-02-02 – 2024-02-03 (×8): 5 mg via ORAL
  Filled 2024-02-02 (×10): qty 1

## 2024-02-02 MED ORDER — CHLORHEXIDINE GLUCONATE CLOTH 2 % EX PADS
6.0000 | MEDICATED_PAD | Freq: Every day | CUTANEOUS | Status: DC
  Administered 2024-02-02 – 2024-02-03 (×2): 6 via TOPICAL

## 2024-02-02 NOTE — Progress Notes (Signed)
 PHARMACY CONSULT NOTE FOR:  OUTPATIENT  PARENTERAL ANTIBIOTIC THERAPY (OPAT)  Indication: Right septic knee Regimen: cefazolin  2g IV q8h End date: 03/12/2024  IV antibiotic discharge orders are pended. To discharging provider:  please sign these orders via discharge navigator,  Select New Orders & click on the button choice - Manage This Unsigned Work.     Thank you for allowing pharmacy to be a part of this patient's care.  Joe Coleman 02/02/2024, 11:37 AM

## 2024-02-02 NOTE — Progress Notes (Signed)
 Peripherally Inserted Central Catheter Placement  The IV Nurse has discussed with the patient and/or persons authorized to consent for the patient, the purpose of this procedure and the potential benefits and risks involved with this procedure.  The benefits include less needle sticks, lab draws from the catheter, and the patient may be discharged home with the catheter. Risks include, but not limited to, infection, bleeding, blood clot (thrombus formation), and puncture of an artery; nerve damage and irregular heartbeat and possibility to perform a PICC exchange if needed/ordered by physician.  Alternatives to this procedure were also discussed.  Bard Power PICC patient education guide, fact sheet on infection prevention and patient information card has been provided to patient /or left at bedside.    PICC Placement Documentation  PICC Single Lumen 02/02/24 Right Brachial 40 cm 0 cm (Active)  Indication for Insertion or Continuance of Line Prolonged intravenous therapies 02/02/24 1850  Exposed Catheter (cm) 0 cm 02/02/24 1850  Site Assessment Clean, Dry, Intact 02/02/24 1850  Line Status Flushed;Saline locked;Blood return noted 02/02/24 1850  Dressing Type Transparent;Securing device 02/02/24 1850  Dressing Status Antimicrobial disc/dressing in place 02/02/24 1850  Line Care Connections checked and tightened 02/02/24 1850  Line Adjustment (NICU/IV Team Only) No 02/02/24 1850  Dressing Intervention New dressing;Adhesive placed at insertion site (IV team only) 02/02/24 1850  Dressing Change Due 02/09/24 02/02/24 1850       Leita Shipper 02/02/2024, 6:51 PM

## 2024-02-02 NOTE — TOC Progression Note (Addendum)
 Transition of Care [facility]) - Progression Note    Patient Details  Name: Joe Coleman MRN: [id number] Date of Birth: 09-11-1945  Transition of Care [facility] CM/SW Contact  Inocente GORMAN Kindle, LCSW Phone Number: 02/02/2024, 12:43 PM  Clinical Narrative:    CSW met with patient and patient reported he would like to go to [facility] for rehab and will require IV antibiotics. CSW confirmed [facility] can accept patient pending insurance approval. CSW explained to patient that he is likely in copay status at [facility] but that it would be billed to him later. He reported understanding. He requested a ginger ale; CSW confirmed with RN and provided to him.   CSW started insurance process, Ref# [id number].    Expected Discharge Plan: Skilled Nursing Facility Barriers to Discharge: Continued Medical Work up, English As A Second Language Teacher               Expected Discharge Plan and Services In-house Referral: Clinical Social Work   Post Acute Care Choice: Skilled Nursing Facility Living arrangements for the past 2 months: Apartment                                       Social Drivers of Health (SDOH) Interventions SDOH Screenings   Food Insecurity: No Food Insecurity (01/26/2024)  Housing: Low Risk (01/26/2024)  Transportation Needs: No Transportation Needs (01/26/2024)  Utilities: Not At Risk (01/26/2024)  Financial Resource Strain: Medium Risk (05/02/2021)   Received from [facility]  Social Connections: Unknown (01/26/2024)  Tobacco Use: Medium Risk (01/30/2024)  Health Literacy: High Risk (05/04/2022)   Received from [facility]    Readmission Risk Interventions    02/02/2024   12:42 PM 09/01/2023   12:00 PM  Readmission Risk Prevention Plan  Transportation Screening Complete Complete  Medication Review Oceanographer) Complete Complete  PCP or Specialist appointment within 3-5 days of discharge Complete Complete  HRI or Home Care Consult Complete Complete  SW  Recovery Care/Counseling Consult Complete Complete  Palliative Care Screening Not Applicable Not Applicable  Skilled Nursing Facility Complete Complete

## 2024-02-02 NOTE — Progress Notes (Signed)
 "        [facility]  Date of Admission:  01/26/2024      Total days of antibiotics 7   Cefazolin           ASSESSMENT: Joe Coleman is a 78 y.o. male admitted with:   MSSA Septic Arthritis/Osteomyelitis, Nonunion -  Last I&D 12/26 with [name]; no growth on OR cultures x 2. MSSA growing from synovial fluid in the setting of chronic open wound. Struggled with this since the summer. 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  CRP downtrending 18.6 >> 11.9  - EOT 03/13/23 Cefazolin  - ID follow up set    Vascular Access -  -PICC ordered - D/W Dr. Sherlon and eGFR ok for conventional PICC -Home health orders to maintain PICC line care and education for patient described below   Discharge Planning / Coordination of Care -  -Outpatient antibiotics set - D/C to SNF planned   Medication Monitoring -  -Safety labs ordered and detailed below to be followed in OPAT clinic  I spoke with his niece over the phone and answered questions to her satisfaction.   ID will sign off - please call back with any questions/concerns or if we can be of further assistance.     PLAN: PICC ordered  IV cefazolin  EOT 2/6   OPAT ORDERS:  Diagnosis: Septic Arthritis knee / OM   Culture Result: MSSA   Allergies[1]   Discharge antibiotics to be given via PICC line:  Cefazolin  2 gm IV q8h    Duration: 6 weeks   End Date: Feb 6   [facility] Care Per Protocol with Biopatch Use: Home health RN for IV administration and teaching, line care and labs.    Labs weekly while on IV antibiotics: _x_ CBC with differential __ BMP **TWICE WEEKLY ON VANCOMYCIN   _x_ CMP _x_ CRP _x_ ESR __ Vancomycin  trough TWICE WEEKLY __ CK  _x_ Please pull PIC at completion of IV antibiotics __ Please leave PIC in place until doctor has seen patient or been notified  Fax weekly labs to [phone]  Clinic Follow  Up Appt: 02/24/2024 @ 9:45 am with Dr. Dennise     Principal Problem:   Septic arthritis of knee, right (HCC) Active Problems:   Protein-calorie malnutrition, severe    (feeding supplement) PROSource Plus  30 mL Oral BID BM   apixaban   5 mg Oral BID   budesonide -glycopyrrolate -formoterol   2 puff Inhalation BID   carvedilol   3.125 mg Oral BID WC   vitamin D3  1,000 Units Oral Daily   cyanocobalamin   1,000 mcg Subcutaneous Daily   Followed by   [name] ON 02/05/2024] cyanocobalamin   1,000 mcg Subcutaneous Q30 days   docusate sodium   100 mg Oral BID   feeding supplement (GLUCERNA SHAKE)  237 mL Oral TID BM   gabapentin   300 mg Oral QHS   insulin  aspart  0-5 Units Subcutaneous QHS   insulin  aspart  0-9 Units Subcutaneous TID WC   insulin  glargine  5 Units Subcutaneous Daily   multivitamin with minerals  1 tablet Oral Daily   pantoprazole   40 mg Oral Daily   polyethylene glycol  17 g Oral Daily   sodium chloride  flush  3 mL Intravenous Q12H   tamsulosin   0.4 mg Oral Daily   thiamine   100 mg Per Tube Daily   Vitamin D  (Ergocalciferol )  50,000 Units Oral Q7 days  SUBJECTIVE: Doing OK w/o complaints   Review of Systems: Review of Systems  Constitutional:  Negative for chills, fever and malaise/fatigue.  Gastrointestinal:  Negative for abdominal pain, diarrhea and vomiting.  Musculoskeletal:  Positive for joint pain.  Skin:  Negative for rash.    Allergies[2]  OBJECTIVE: Vitals:   02/02/24 0400 02/02/24 0800 02/02/24 0846 02/02/24 0900  BP: 119/66 (!) 140/62    Pulse: 68 67  73  Resp: 18 20  11   Temp: 98.7 F (37.1 C) 97.9 F (36.6 C)    TempSrc: Oral Oral    SpO2: 91% 91% 90% 90%  Weight:      Height:       Body mass index is 20.23 kg/m.  Physical Exam Constitutional:      Appearance: Normal appearance. He is not ill-appearing.  HENT:     Head: Normocephalic.     Mouth/Throat:     Mouth: Mucous membranes are moist.     Pharynx: Oropharynx is clear.  Eyes:      General: No scleral icterus. Cardiovascular:     Rate and Rhythm: Normal rate.  Pulmonary:     Effort: Pulmonary effort is normal.  Musculoskeletal:        General: Normal range of motion.     Cervical back: Normal range of motion.  Skin:    Coloration: Skin is not jaundiced or pale.  Neurological:     Mental Status: He is alert and oriented to person, place, and time.  Psychiatric:        Mood and Affect: Mood normal.        Judgment: Judgment normal.     Lab Results Lab Results  Component Value Date   WBC 11.8 (H) 02/02/2024   HGB 9.3 (L) 02/02/2024   HCT 30.5 (L) 02/02/2024   MCV 89.4 02/02/2024   PLT 365 02/02/2024    Lab Results  Component Value Date   CREATININE 1.45 (H) 02/02/2024   BUN 28 (H) 02/02/2024   NA 136 02/02/2024   K 4.7 02/02/2024   CL 101 02/02/2024   CO2 29 02/02/2024    Lab Results  Component Value Date   ALT 6 01/24/2024   AST 20 01/24/2024   ALKPHOS 174 (H) 01/24/2024   BILITOT 0.5 01/24/2024     Microbiology: Recent Results (from the past 240 hours)  Resp panel by RT-PCR (RSV, Flu A&B, Covid) Anterior Nasal Swab     Status: None   Collection Time: 01/24/24  3:08 PM   Specimen: Anterior Nasal Swab  Result Value Ref Range Status   SARS Coronavirus 2 by RT PCR NEGATIVE NEGATIVE Final    Comment: (NOTE) SARS-CoV-2 target nucleic acids are NOT DETECTED.  The SARS-CoV-2 RNA is generally detectable in upper respiratory specimens during the acute phase of infection. The lowest concentration of SARS-CoV-2 viral copies this assay can detect is 138 copies/mL. A negative result does not preclude SARS-Cov-2 infection and should not be used as the sole basis for treatment or other patient management decisions. A negative result may occur with  improper specimen collection/handling, submission of specimen other than nasopharyngeal swab, presence of viral mutation(s) within the areas targeted by this assay, and inadequate number of  viral copies(<138 copies/mL). A negative result must be combined with clinical observations, patient history, and epidemiological information. The expected result is Negative.  Fact Sheet for Patients:  bloggercourse.com  Fact Sheet for Healthcare Providers:  seriousbroker.it  This test is no t yet approved or cleared  by the [place]  FDA and  has been authorized for detection and/or diagnosis of SARS-CoV-2 by FDA under an Emergency Use Authorization (EUA). This EUA will remain  in effect (meaning this test can be used) for the duration of the COVID-19 declaration under Section 564(b)(1) of the Act, 21 U.S.C.section 360bbb-3(b)(1), unless the authorization is terminated  or revoked sooner.       Influenza A by PCR NEGATIVE NEGATIVE Final   Influenza B by PCR NEGATIVE NEGATIVE Final    Comment: (NOTE) The Xpert Xpress SARS-CoV-2/FLU/RSV plus assay is intended as an aid in the diagnosis of influenza from Nasopharyngeal swab specimens and should not be used as a sole basis for treatment. Nasal washings and aspirates are unacceptable for Xpert Xpress SARS-CoV-2/FLU/RSV testing.  Fact Sheet for Patients: bloggercourse.com  Fact Sheet for Healthcare Providers: seriousbroker.it  This test is not yet approved or cleared by the [place]  FDA and has been authorized for detection and/or diagnosis of SARS-CoV-2 by FDA under an Emergency Use Authorization (EUA). This EUA will remain in effect (meaning this test can be used) for the duration of the COVID-19 declaration under Section 564(b)(1) of the Act, 21 U.S.C. section 360bbb-3(b)(1), unless the authorization is terminated or revoked.     Resp Syncytial Virus by PCR NEGATIVE NEGATIVE Final    Comment: (NOTE) Fact Sheet for Patients: bloggercourse.com  Fact Sheet for Healthcare  Providers: seriousbroker.it  This test is not yet approved or cleared by the [place]  FDA and has been authorized for detection and/or diagnosis of SARS-CoV-2 by FDA under an Emergency Use Authorization (EUA). This EUA will remain in effect (meaning this test can be used) for the duration of the COVID-19 declaration under Section 564(b)(1) of the Act, 21 U.S.C. section 360bbb-3(b)(1), unless the authorization is terminated or revoked.  Performed at [facility], [address] [place], [place] [address]   Blood Culture (routine x 2)     Status: None   Collection Time: 01/24/24  3:20 PM   Specimen: BLOOD  Result Value Ref Range Status   Specimen Description BLOOD BLOOD LEFT HAND  Final   Special Requests   Final    Blood Culture results may not be optimal due to an inadequate volume of blood received in culture bottles   Culture   Final    NO GROWTH 5 DAYS Performed at [facility], [address] [place], [place] [address]    Report Status 01/29/2024 FINAL  Final  Blood Culture (routine x 2)     Status: None   Collection Time: 01/24/24  3:20 PM   Specimen: BLOOD  Result Value Ref Range Status   Specimen Description BLOOD BLOOD RIGHT ARM  Final   Special Requests   Final    Blood Culture results may not be optimal due to an inadequate volume of blood received in culture bottles   Culture   Final    NO GROWTH 5 DAYS Performed at [facility], [address] [place], [place] [address]    Report Status 01/29/2024 FINAL  Final  MRSA Next Gen by PCR, Nasal     Status: Abnormal   Collection Time: 01/27/24  6:02 AM   Specimen: Nasal Mucosa; Nasal Swab  Result Value Ref Range Status   MRSA by PCR Next Gen DETECTED (A) NOT DETECTED Final    Comment: RESULT CALLED TO, READ BACK BY AND VERIFIED WITH: RN JESSICA B [id number] FCP (NOTE) The GeneXpert MRSA Assay (FDA approved  for NASAL specimens only), is one component of  a comprehensive MRSA colonization surveillance program. It is not intended to diagnose MRSA infection nor to guide or monitor treatment for MRSA infections. Test performance is not FDA approved in patients less than 16 years old. Performed at [facility], [address], [place], [place] [address]   Body fluid culture w Gram Stain     Status: None   Collection Time: 01/27/24 10:18 AM   Specimen: Synovium; Body Fluid  Result Value Ref Range Status   Specimen Description SYNOVIAL  Final   Special Requests Normal  Final   Gram Stain   Final    ABUNDANT WBC PRESENT, PREDOMINANTLY PMN NO ORGANISMS SEEN    Culture   Final    RARE STAPHYLOCOCCUS AUREUS CRITICAL RESULT CALLED TO, READ BACK BY AND VERIFIED WITH: RN MARLA GRAYSON [phone] FCP Performed at [facility], [address] [place], [place] [address]    Report Status 01/29/2024 FINAL  Final   Organism ID, Bacteria STAPHYLOCOCCUS AUREUS  Final      Susceptibility   Staphylococcus aureus - MIC*    CIPROFLOXACIN  >=8 RESISTANT Resistant     ERYTHROMYCIN >=8 RESISTANT Resistant     GENTAMICIN <=0.5 SENSITIVE Sensitive     OXACILLIN 0.5 SENSITIVE Sensitive     TETRACYCLINE >=16 RESISTANT Resistant     VANCOMYCIN  <=0.5 SENSITIVE Sensitive     TRIMETH/SULFA <=10 SENSITIVE Sensitive     CLINDAMYCIN >=8 RESISTANT Resistant     RIFAMPIN <=0.5 SENSITIVE Sensitive     Inducible Clindamycin NEGATIVE Sensitive     LINEZOLID  2 SENSITIVE Sensitive     * RARE STAPHYLOCOCCUS AUREUS  Aerobic/Anaerobic Culture w Gram Stain (surgical/deep wound)     Status: None (Preliminary result)   Collection Time: 01/30/24 11:16 AM   Specimen: Soft Tissue, Other  Result Value Ref Range Status   Specimen Description SYNOVIAL  Final   Special Requests RIGHT KNEE SYNOVIAL  Final   Gram Stain   Final    ABUNDANT WBC PRESENT, PREDOMINANTLY PMN NO ORGANISMS SEEN    Culture   Final    NO GROWTH 3 DAYS NO ANAEROBES ISOLATED; CULTURE IN PROGRESS FOR  5 DAYS Performed at [facility], [address] [place], [place] [address]    Report Status PENDING  Incomplete  Aerobic/Anaerobic Culture w Gram Stain (surgical/deep wound)     Status: None (Preliminary result)   Collection Time: 01/30/24 11:17 AM   Specimen: Soft Tissue, Other  Result Value Ref Range Status   Specimen Description SYNOVIAL  Final   Special Requests RIGHT KNEE SYNOVIUM  Final   Gram Stain   Final    RARE WBC PRESENT, PREDOMINANTLY PMN NO ORGANISMS SEEN    Culture   Final    NO GROWTH 3 DAYS NO ANAEROBES ISOLATED; CULTURE IN PROGRESS FOR 5 DAYS Performed at [facility], [address], [place], [place] [address]    Report Status PENDING  Incomplete     Corean Fireman, MSN, NP-C [facility]  [email or web address] Pager: [phone] Office: [phone] [facility] Main Line: [phone] *Secure Chat Communication Welcome  Total Encounter Time:      [1]  Allergies Allergen Reactions   Losartan Other (See Comments)    Hyperkalemia (>5.5 multiple times on losartan 25mg )   Chicken Allergy Nausea And Vomiting   Poultry Meal Nausea And Vomiting  [2]  Allergies Allergen Reactions  Losartan Other (See Comments)    Hyperkalemia (>5.5 multiple times on losartan 25mg )   Chicken Allergy Nausea And Vomiting   Poultry Meal Nausea And Vomiting   "

## 2024-02-02 NOTE — Progress Notes (Signed)
 "                                                                                                                                                                                                                                                                                PROGRESS NOTE     Patient Demographics:    Joe Coleman, is a 78 y.o. male, DOB - 23-May-1945, FMW:[id number]  Outpatient Primary MD for the patient is [facility]    LOS - 7  Admit date - 01/26/2024    No chief complaint on file.      Brief Narrative (HPI from H&P)     78 y.o. male with medical history significant of diabetes mellitus 2 on metformin , BPH on Flomax , CKD 3A, hypertension, GERD, COPD and history of DVT on Eliquis .  Patient was hospitalized back in September 2025 due to infected hardware in the right knee.  He underwent removal of hardware.  Wound cultures at that time positive for pansensitive Enterobacter and Klebsiella.  He was discharged home on linezolid  and doxycycline  as well as 25 days of IV ertapenem  to treat underlying osteomyelitis.  Readmitted to the hospital in November 2025 due to sepsis secondary to pneumonia.  He was discharged on Augmentin  as well as supportive care.    He states several days before he presented this time he followed up with his orthopedic team and follow-up surgical intervention was recommended.  He states he does have some memory issues and this may not be an accurate assessment.  He reports about 2 days ago he began having significant increase in pain and drainage and increased redness at the prior right lower extremity wound below the knee.  He had run out of his prior Percocet and was having difficulty managing his pain.  He presented to the ED where CT of the knee and lower extremity revealed a moderate to large thick-walled joint effusion as well as a comminuted proximal tibia fracture.  He was evaluated by Dr. Cristy with orthopedic team at [facility] and due to the complexity of his case it was recommended that he be transferred to [facility]    Subjective:  Reports pain in right knee area, controlled    Assessment  & Plan :   Septic right knee with effusion Previous infection with osteomyelitis requiring removal of prior knee hardware in September 2025 and was treated with prolonged oral and IV antibiotics.  Cultures during that admission were positive for pansensitive Enterobacter and Klebsiella.   - Antibiotics management per ID  - Hepatic input greatly appreciated, status post arthrocentesis 12/23, with cell count of 69,000, plan for I&D of right knee by [name], went for I&D 01/30/2024. -Intraoperative cultures with no growth seen, but arthrocentesis fluid growing MSSA, antibiotic management per ID, currently on cefazolin . - Postop recommendation per orthopedic, patient can work with PT, OT, okay to resume his Eliquis , weightbearing as tolerated - For PICC line insertion per ID discharge on IV antibiotics.    AKI on CKD IIIa baseline creatinine around 1.3, gentle hydration IV fluids, monitor bladder scans and BMP.   History of DVT on Eliquis ,  Initially on full dose Lovenox  in anticipation for surgery, now postop he is back on Eliquis .  Anemia - Transfuse 1 unit PRBC for hemoglobin of 7.6, hemoglobin stable posttransfusion  BPH Continue Flomax    Hypertension placed on low-dose Coreg .   COPD  Continue as needed albuterol  neb, On Trelegy at home-therapeutic substitution with Breztri , stable.   GERD  Continue PPI   Severe protein calorie malnutrition Current BMI 19.49, add protein supplement, will consult nutritionist  Hypophosphatemia - Replaced  Diabetes mellitus 2 - SSI.  Remains elevated so we will start on low-dose Semglee   Lab Results  Component Value Date   HGBA1C 6.0 (H) 01/25/2024   CBG (last 3)  Recent Labs    02/01/24 2103 02/02/24 0830 02/02/24 1206  GLUCAP 183* 149* 151*        Condition - Extremely Guarded  Family Communication  : None at bedside  Code Status : Full code  Consults  : Orthopedics, ID  PUD Prophylaxis : PPI   Procedures  :      CT 01/24/24 - R Knee -   1. Knee arthroplasty in expected alignment. Comminuted proximal tibial fracture extends to the lateral aspect of the tibial tray as well as the cement adjacent to the tibial stem. Tibial metaphyseal and tibial shaft component demonstrates persistent fracture lucency and no bony bridging, suspicious for delayed/nonunion. 2. Proximal fibular fracture has incomplete surrounding callus formation. 3. Moderate to large thick-walled joint effusion with synovial enhancement. This may be inflammatory or infectious, consider fluid sampling. 4. Generalized soft tissue edema. No focal fluid collection. No soft tissue gas.      Disposition Plan  :    Status is: Inpatient   DVT Prophylaxis  : On Eliquis     Lab Results  Component Value Date   PLT 365 02/02/2024    Diet :  Diet Order             Diet Carb Modified Room service appropriate? Yes  Diet effective now                    Inpatient Medications  Scheduled Meds:  (feeding supplement) PROSource Plus  30 mL Oral BID BM   apixaban   5 mg Oral BID   budesonide -glycopyrrolate -formoterol   2 puff Inhalation BID   carvedilol   3.125 mg Oral BID WC   vitamin D3  1,000 Units Oral Daily   cyanocobalamin   1,000 mcg Subcutaneous Daily   Followed by   [name] ON 02/05/2024] cyanocobalamin   1,000 mcg  Subcutaneous Q30 days   docusate sodium   100 mg Oral BID   feeding supplement (GLUCERNA SHAKE)  237 mL Oral TID BM   gabapentin   300 mg Oral QHS   insulin  aspart  0-5 Units Subcutaneous QHS   insulin  aspart  0-9 Units Subcutaneous TID WC   insulin  glargine  5 Units Subcutaneous Daily   multivitamin with minerals  1 tablet Oral Daily   pantoprazole   40 mg Oral Daily   polyethylene glycol  17 g Oral Daily   sodium chloride  flush  3 mL  Intravenous Q12H   tamsulosin   0.4 mg Oral Daily   thiamine   100 mg Per Tube Daily   Vitamin D  (Ergocalciferol )  50,000 Units Oral Q7 days   Continuous Infusions:   ceFAZolin  (ANCEF ) IV 2 g (02/02/24 0455)   PRN Meds:.acetaminophen  **OR** [DISCONTINUED] acetaminophen , alum & mag hydroxide-simeth, guaiFENesin -dextromethorphan , melatonin, methocarbamol , metoCLOPramide  **OR** metoCLOPramide  (REGLAN ) injection, ondansetron  **OR** ondansetron  (ZOFRAN ) IV, oxyCODONE , polyethylene glycol  Antibiotics  :    Anti-infectives (From admission, onward)    Start     Dose/Rate Route Frequency Ordered Stop   01/30/24 1430  ceFAZolin  (ANCEF ) IVPB 2g/100 mL premix        2 g 200 mL/hr over 30 Minutes Intravenous Every 8 hours 01/30/24 1344     01/30/24 1129  tobramycin  (NEBCIN ) powder  Status:  Discontinued          As needed 01/30/24 1149 01/30/24 1149   01/30/24 1129  vancomycin  (VANCOCIN ) powder  Status:  Discontinued          As needed 01/30/24 1149 01/30/24 1149   01/27/24 1800  DAPTOmycin  (CUBICIN ) IVPB 500 mg/37mL premix  Status:  Discontinued        8 mg/kg  59 kg 100 mL/hr over 30 Minutes Intravenous Daily 01/27/24 1036 01/30/24 1344   01/27/24 1600  ertapenem  (INVANZ ) 1 g in sodium chloride  0.9 % 100 mL IVPB  Status:  Discontinued        1 g 200 mL/hr over 30 Minutes Intravenous Every 24 hours 01/27/24 1036 01/30/24 1344   01/27/24 0915  ceFEPIme  (MAXIPIME ) 2 g in sodium chloride  0.9 % 100 mL IVPB  Status:  Discontinued        2 g 200 mL/hr over 30 Minutes Intravenous Every 12 hours 01/27/24 0825 01/27/24 1036   01/27/24 0500  vancomycin  (VANCOREADY) IVPB 750 mg/150 mL  Status:  Discontinued        750 mg 150 mL/hr over 60 Minutes Intravenous Every 24 hours 01/26/24 1616 01/27/24 1036   01/26/24 2200  cefTRIAXone  (ROCEPHIN ) 2 g in sodium chloride  0.9 % 100 mL IVPB  Status:  Discontinued        2 g 200 mL/hr over 30 Minutes Intravenous Daily at bedtime 01/26/24 1616 01/27/24 0825    01/26/24 1700  metroNIDAZOLE  (FLAGYL ) IVPB 500 mg  Status:  Discontinued        500 mg 100 mL/hr over 60 Minutes Intravenous Every 12 hours 01/26/24 1605 01/27/24 1036         Objective:   Vitals:   02/02/24 0400 02/02/24 0800 02/02/24 0846 02/02/24 0900  BP: 119/66 (!) 140/62    Pulse: 68 67  73  Resp: 18 20  11   Temp: 98.7 F (37.1 C) 97.9 F (36.6 C)    TempSrc: Oral Oral    SpO2: 91% 91% 90% 90%  Weight:      Height:        Wt Readings  from Last 3 Encounters:  01/30/24 61.2 kg  01/24/24 59 kg  12/20/23 59 kg     Intake/Output Summary (Last 24 hours) at 02/02/2024 1307 Last data filed at 02/02/2024 9166 Gross per 24 hour  Intake --  Output 2400 ml  Net -2400 ml     Physical Exam  Awake Alert, Oriented X 3, frail, chronically ill-appearing Good air entry Regular rate and rhythm Abdomen soft Lower extremity bandaged, edema present      Data Review:    Recent Labs  Lab 01/28/24 0150 01/29/24 0227 01/30/24 0532 01/31/24 0546 02/01/24 0540 02/02/24 0814  WBC 11.0* 8.8 7.9 10.2 13.5* 11.8*  HGB 8.1* 8.0* 7.6* 9.2* 8.6* 9.3*  HCT 26.1* 25.9* 25.1* 29.1* 27.3* 30.5*  PLT 244 242 258 276 290 365  MCV 88.2 88.1 89.0 87.7 88.9 89.4  MCH 27.4 27.2 27.0 27.7 28.0 27.3  MCHC 31.0 30.9 30.3 31.6 31.5 30.5  RDW 14.4 14.5 14.6 14.1 14.6 14.6  LYMPHSABS 0.8 0.9 1.1 0.7 1.5  --   MONOABS 1.2* 1.0 0.9 0.4 0.9  --   EOSABS 0.1 0.2 0.2 0.0 0.0  --   BASOSABS 0.0 0.0 0.0 0.0 0.0  --     Recent Labs  Lab 01/27/24 0542 01/27/24 1309 01/28/24 0150 01/29/24 0227 01/30/24 0532 01/31/24 0546 02/01/24 0540 02/02/24 0814  NA  --   --  133* 135 138 136 135 136  K  --   --  4.0 3.9 4.4 4.7 4.5 4.7  CL  --   --  103 103 105 104 102 101  CO2  --   --  18* 24 26 23 25 29   ANIONGAP  --   --  13 8 7 9 8 6   GLUCOSE  --   --  132* 171* 123* 314* 181* 148*  BUN  --   --  23 20 22  26* 30* 28*  CREATININE  --   --  1.54* 1.40* 1.42* 1.43* 1.58* 1.45*  CRP 30.3*   --  25.9* 18.6* 15.5* 11.9*  --   --   PROCALCITON 1.38  --  0.96 0.55 0.35 0.24 0.19  --   MG  --  1.9 1.9 2.0 2.0 2.1  --   --   PHOS  --  3.0 2.4* 2.2* 3.3  --   --   --   CALCIUM   --   --  8.6* 8.4* 8.4* 8.4* 8.1* 8.5*      Recent Labs  Lab 01/27/24 0542 01/27/24 1309 01/28/24 0150 01/29/24 0227 01/30/24 0532 01/31/24 0546 02/01/24 0540 02/02/24 0814  CRP 30.3*  --  25.9* 18.6* 15.5* 11.9*  --   --   PROCALCITON 1.38  --  0.96 0.55 0.35 0.24 0.19  --   MG  --  1.9 1.9 2.0 2.0 2.1  --   --   CALCIUM   --   --  8.6* 8.4* 8.4* 8.4* 8.1* 8.5*    --------------------------------------------------------------------------------------------------------------- No results found for: CHOL, HDL, LDLCALC, LDLDIRECT, TRIG, CHOLHDL  Lab Results  Component Value Date   HGBA1C 6.0 (H) 01/25/2024   No results for input(s): TSH, T4TOTAL, FREET4, T3FREE, THYROIDAB in the last 72 hours. No results for input(s): VITAMINB12, FOLATE, FERRITIN, TIBC, IRON , RETICCTPCT in the last 72 hours. ------------------------------------------------------------------------------------------------------------------ Cardiac Enzymes No results for input(s): CKMB, TROPONINI, MYOGLOBIN in the last 168 hours.  Invalid input(s): CK  Micro Results Recent Results (from the past 240 hours)  Resp panel by RT-PCR (RSV,  Flu A&B, Covid) Anterior Nasal Swab     Status: None   Collection Time: 01/24/24  3:08 PM   Specimen: Anterior Nasal Swab  Result Value Ref Range Status   SARS Coronavirus 2 by RT PCR NEGATIVE NEGATIVE Final    Comment: (NOTE) SARS-CoV-2 target nucleic acids are NOT DETECTED.  The SARS-CoV-2 RNA is generally detectable in upper respiratory specimens during the acute phase of infection. The lowest concentration of SARS-CoV-2 viral copies this assay can detect is 138 copies/mL. A negative result does not preclude SARS-Cov-2 infection and should not be used  as the sole basis for treatment or other patient management decisions. A negative result may occur with  improper specimen collection/handling, submission of specimen other than nasopharyngeal swab, presence of viral mutation(s) within the areas targeted by this assay, and inadequate number of viral copies(<138 copies/mL). A negative result must be combined with clinical observations, patient history, and epidemiological information. The expected result is Negative.  Fact Sheet for Patients:  bloggercourse.com  Fact Sheet for Healthcare Providers:  seriousbroker.it  This test is no t yet approved or cleared by the [place]  FDA and  has been authorized for detection and/or diagnosis of SARS-CoV-2 by FDA under an Emergency Use Authorization (EUA). This EUA will remain  in effect (meaning this test can be used) for the duration of the COVID-19 declaration under Section 564(b)(1) of the Act, 21 U.S.C.section 360bbb-3(b)(1), unless the authorization is terminated  or revoked sooner.       Influenza A by PCR NEGATIVE NEGATIVE Final   Influenza B by PCR NEGATIVE NEGATIVE Final    Comment: (NOTE) The Xpert Xpress SARS-CoV-2/FLU/RSV plus assay is intended as an aid in the diagnosis of influenza from Nasopharyngeal swab specimens and should not be used as a sole basis for treatment. Nasal washings and aspirates are unacceptable for Xpert Xpress SARS-CoV-2/FLU/RSV testing.  Fact Sheet for Patients: bloggercourse.com  Fact Sheet for Healthcare Providers: seriousbroker.it  This test is not yet approved or cleared by the [place]  FDA and has been authorized for detection and/or diagnosis of SARS-CoV-2 by FDA under an Emergency Use Authorization (EUA). This EUA will remain in effect (meaning this test can be used) for the duration of the COVID-19 declaration under Section 564(b)(1)  of the Act, 21 U.S.C. section 360bbb-3(b)(1), unless the authorization is terminated or revoked.     Resp Syncytial Virus by PCR NEGATIVE NEGATIVE Final    Comment: (NOTE) Fact Sheet for Patients: bloggercourse.com  Fact Sheet for Healthcare Providers: seriousbroker.it  This test is not yet approved or cleared by the [place]  FDA and has been authorized for detection and/or diagnosis of SARS-CoV-2 by FDA under an Emergency Use Authorization (EUA). This EUA will remain in effect (meaning this test can be used) for the duration of the COVID-19 declaration under Section 564(b)(1) of the Act, 21 U.S.C. section 360bbb-3(b)(1), unless the authorization is terminated or revoked.  Performed at [facility], [address] [place], [place] [address]   Blood Culture (routine x 2)     Status: None   Collection Time: 01/24/24  3:20 PM   Specimen: BLOOD  Result Value Ref Range Status   Specimen Description BLOOD BLOOD LEFT HAND  Final   Special Requests   Final    Blood Culture results may not be optimal due to an inadequate volume of blood received in culture bottles   Culture   Final    NO GROWTH 5 DAYS Performed at [facility],  [address], [place], [place] [address]    Report Status 01/29/2024 FINAL  Final  Blood Culture (routine x 2)     Status: None   Collection Time: 01/24/24  3:20 PM   Specimen: BLOOD  Result Value Ref Range Status   Specimen Description BLOOD BLOOD RIGHT ARM  Final   Special Requests   Final    Blood Culture results may not be optimal due to an inadequate volume of blood received in culture bottles   Culture   Final    NO GROWTH 5 DAYS Performed at [facility], [address], [place], [place] [address]    Report Status 01/29/2024 FINAL  Final  MRSA Next Gen by PCR, Nasal     Status: Abnormal   Collection Time: 01/27/24  6:02 AM   Specimen: Nasal Mucosa; Nasal Swab   Result Value Ref Range Status   MRSA by PCR Next Gen DETECTED (A) NOT DETECTED Final    Comment: RESULT CALLED TO, READ BACK BY AND VERIFIED WITH: RN JESSICA B [id number] FCP (NOTE) The GeneXpert MRSA Assay (FDA approved for NASAL specimens only), is one component of a comprehensive MRSA colonization surveillance program. It is not intended to diagnose MRSA infection nor to guide or monitor treatment for MRSA infections. Test performance is not FDA approved in patients less than 58 years old. Performed at [facility], [address] [place], [place] [address]   Body fluid culture w Gram Stain     Status: None   Collection Time: 01/27/24 10:18 AM   Specimen: Synovium; Body Fluid  Result Value Ref Range Status   Specimen Description SYNOVIAL  Final   Special Requests Normal  Final   Gram Stain   Final    ABUNDANT WBC PRESENT, PREDOMINANTLY PMN NO ORGANISMS SEEN    Culture   Final    RARE STAPHYLOCOCCUS AUREUS CRITICAL RESULT CALLED TO, READ BACK BY AND VERIFIED WITH: RN MARLA GRAYSON [phone] FCP Performed at [facility], [address] [place], [place] [address]    Report Status 01/29/2024 FINAL  Final   Organism ID, Bacteria STAPHYLOCOCCUS AUREUS  Final      Susceptibility   Staphylococcus aureus - MIC*    CIPROFLOXACIN  >=8 RESISTANT Resistant     ERYTHROMYCIN >=8 RESISTANT Resistant     GENTAMICIN <=0.5 SENSITIVE Sensitive     OXACILLIN 0.5 SENSITIVE Sensitive     TETRACYCLINE >=16 RESISTANT Resistant     VANCOMYCIN  <=0.5 SENSITIVE Sensitive     TRIMETH/SULFA <=10 SENSITIVE Sensitive     CLINDAMYCIN >=8 RESISTANT Resistant     RIFAMPIN <=0.5 SENSITIVE Sensitive     Inducible Clindamycin NEGATIVE Sensitive     LINEZOLID  2 SENSITIVE Sensitive     * RARE STAPHYLOCOCCUS AUREUS  Aerobic/Anaerobic Culture w Gram Stain (surgical/deep wound)     Status: None (Preliminary result)   Collection Time: 01/30/24 11:16 AM   Specimen: Soft Tissue, Other  Result Value  Ref Range Status   Specimen Description SYNOVIAL  Final   Special Requests RIGHT KNEE SYNOVIAL  Final   Gram Stain   Final    ABUNDANT WBC PRESENT, PREDOMINANTLY PMN NO ORGANISMS SEEN    Culture   Final    NO GROWTH 3 DAYS NO ANAEROBES ISOLATED; CULTURE IN PROGRESS FOR 5 DAYS Performed at [facility], [address] [place], [place] [address]    Report Status PENDING  Incomplete  Aerobic/Anaerobic Culture w Gram Stain (  surgical/deep wound)     Status: None (Preliminary result)   Collection Time: 01/30/24 11:17 AM   Specimen: Soft Tissue, Other  Result Value Ref Range Status   Specimen Description SYNOVIAL  Final   Special Requests RIGHT KNEE SYNOVIUM  Final   Gram Stain   Final    RARE WBC PRESENT, PREDOMINANTLY PMN NO ORGANISMS SEEN    Culture   Final    NO GROWTH 3 DAYS NO ANAEROBES ISOLATED; CULTURE IN PROGRESS FOR 5 DAYS Performed at [facility], [address] [place], [place] [address]    Report Status PENDING  Incomplete    Radiology Report US  EKG SITE RITE Result Date: 02/02/2024 If Site Rite image not attached, placement could not be confirmed due to current cardiac rhythm.    Signature  -   Brayton Lye M.D on 02/02/2024 at 1:07 PM   -  To page go to [email or web address]   "

## 2024-02-02 NOTE — Progress Notes (Signed)
 I personally performed substantive portion of this visit, including history, exam, labs/imaging review, and decision making. I have formulated independent complex medical decision making as indicated in documentation. Any addendum/correction included   Please see 12/29 Corean Fireman note for detail   Patient no complaint 12/26 I&D right knee cx negative but 12/23 right knee aspirate mssa  Previous orif associated infection with enterobacter cloacae and kleb pna s/p cefepime  and dapto and removal hardware 10/15/23. Transitioned to doxy/cipro  finished late 12/2023  Admitted with right knee infection and nonhealing wound rle  12/26 I&D right knee; wound closure    Exam: No distress Alert/oriented Normocephalic Nice long beard Abd s/nt Cv rrr no mrg Lungs normal respiratory effort Rle in dressing c/d Neuro nonfocal    A/p Native pji -- mssa S/p I&D 12/25 with closure chronic rle wound   -finish 6 weeks cefazolin  from 01/31/27 -opat as outlined -id clinic f/u -ok to discharge when cleared by ortho -maintain standard isolation precaution -id will sign off

## 2024-02-02 NOTE — Progress Notes (Signed)
 Nutrition Follow-up  DOCUMENTATION CODES:   Severe malnutrition in context of chronic illness  INTERVENTION:  Continue Glucerna Shake po TID, each supplement provides 220 kcal and 10 grams of protein   Continue 30 ml ProSource Plus BID, each supplement provides 100 kcals and 15 grams protein.     Continue Magic cup TID with meals, each supplement provides 290 kcal and 9 grams of protein   Continue MVI w/ minerals, vitamin B12, and vitamin D     Recommend redraw wound lab panel when CRP normalized   Continue Thiamine  100 mg daily for 7 days    Continue carb modified diet to promote wound healing             -Encourage PO intake             -Automatic meal trays             -Collected food preferences and added to meal trays    NUTRITION DIAGNOSIS:  Severe Malnutrition related to chronic illness as evidenced by severe fat depletion, severe muscle depletion.  GOAL:  Patient will meet greater than or equal to 90% of their needs   MONITOR:  PO intake, Supplement acceptance, Labs, Skin  REASON FOR ASSESSMENT:   Consult Assessment of nutrition requirement/status  ASSESSMENT:   Pt with PMH significant for: T2DM, CKDIII, HTN, GERD, COPD, DVT (Eliquis ), and BPH on Flomax . [facility] on with recent fall and c/o progressively worsening R knee pain. Transferred to [facility] and admitted for large joint effusion and continued proximal tibia fracture.  S/p I&D of R knee on 12/26. Required 1 unit PRBCs prior to surgery. Intraoperative cultures showed no growth, however arthrocentesis fluid growing MSSA. IV ABX in place. Now back on Eliquis . Working with PT/OT.   Average Meal Completion: No documented meal intake to review   Noted to be recently refusing Glucerna and Prosource offerings. Discussed with patient at bedside today and the importance of ONS in setting of poor PO intake and to promote wound healing as well a progress with therapy. He verbalized understanding.     Admit  Weight: 65.4 kg Current Weight: 61.2 kg   No new weight collected on admission. New weight collected since last encounter. Weight stable over last 5 months, it appears. Monitoring trend as he has trended down this admission. Some mild edema to RLE (surgical site).   Wound lab panel has now resulted. While multiple vitamin deficiencies are observed. In the presence of significant inflammation (CRP>20), this is skewing these results. Recommend redraw once CRP has stabilized.   Continues with vitamin D  and vitamin B12 supplementation ordered. Has not required phosphorus repletion in 3-4 days.    Meds: vitamin D3, vitamin B12, docusate sodium , SS Novolog , MVI, pantoprazole , Miralax , thiamine , IV ABX     Labs from 12/28: Na+ 135 (wdl) K+ 4.5 (wdl) PHOS 2.4>2.2>3.3 (wdl) WBC 11.8 (H) CBGs 181-314 x24 hours A1c 6.0 (01/2024)   Micronutrient Lab Panel (12/24): CRP 25.9 (H) Vitamin D  12.0 (L) - CRP level >20 falsely lowers vitamin D  levels Vitamin A  13.1 (L) - CRP level >20 falsely lowers vitamin A  levels Zinc  32 (L) - CRP level >20 falsely lowers zinc  levels  Diet Order:   Diet Order             Diet Carb Modified Room service appropriate? Yes  Diet effective now            EDUCATION NEEDS:  Education needs have been addressed  Skin:  Skin Assessment: Reviewed RN Assessment  Last BM:  12/28  Height:  Ht Readings from Last 1 Encounters:  01/30/24 5' 8.5 (1.74 m)   Weight:  Wt Readings from Last 1 Encounters:  01/30/24 61.2 kg   Ideal Body Weight:  71.4 kg  BMI:  Body mass index is 20.23 kg/m.  Estimated Nutritional Needs:   Kcal:  1600-1800 kcals  Protein:  90-105g  Fluid:  >1.6L/day  Blair Deaner MS, RD, LDN Registered Dietitian I Clinical Nutrition RD Inpatient Contact Info in Amion

## 2024-02-02 NOTE — Care Management Important Message (Signed)
 Important Message  Patient Details  Name: Joe Coleman MRN: [id number] Date of Birth: [birth year]   Important Message Given:  Yes - Medicare IM     Claretta Deed 02/02/2024, 3:09 PM

## 2024-02-02 NOTE — [place] FL2 (Signed)
 " [place]  MEDICAID FL2 LEVEL OF CARE FORM     IDENTIFICATION  Patient Name: Joe Coleman Birthdate: 1945-09-25 Sex: male Admission Date (Current Location): 01/26/2024  [facility] and [place] Number:  Chiropodist and Address:  [facility] [place], [address], [place], [place] [address]      Provider Number: [id number]  Attending Physician Name and Address:  Elgergawy, Brayton RAMAN, MD  Relative Name and Phone Number:       Current Level of Care: Hospital Recommended Level of Care: Skilled Nursing Facility Prior Approval Number:    Date Approved/Denied:   PASRR Number: [id number]  Discharge Plan: SNF    Current Diagnoses: Patient Active Problem List   Diagnosis Date Noted   Protein-calorie malnutrition, severe 01/28/2024   Septic arthritis of knee, right (HCC) 01/26/2024   Aspiration pneumonia (HCC) 12/21/2023   Hyperkalemia 12/21/2023   History of deep vein thrombosis (DVT) of lower extremity 12/21/2023   Acute urinary retention 12/21/2023   Sepsis due to pneumonia (HCC) 12/20/2023   Hardware complicating wound infection 10/29/2023   Uncontrolled type 2 diabetes mellitus with hyperglycemia, without long-term current use of insulin  (HCC) 10/29/2023   Type 2 diabetes mellitus with peripheral neuropathy (HCC) 10/28/2023   GERD without esophagitis 10/28/2023   Chronic obstructive pulmonary disease (COPD) (HCC) 10/28/2023   Cellulitis of right leg 10/28/2023   Rhabdomyolysis 10/28/2023   Pressure injury of skin 10/28/2023   History of DVT (deep vein thrombosis) 10/28/2023   Normocytic anemia 10/28/2023   Acute metabolic encephalopathy 10/27/2023   Acute kidney injury superimposed on chronic kidney disease 10/27/2023   Open knee wound, right, sequela 10/14/2023   Malnutrition of moderate degree 08/30/2023   Tibia/fibula fracture 08/24/2023   Cellulitis of right arm 08/10/2023   Eye pain, bilateral 07/26/2022   Fall 07/26/2022   Lymphopenia  07/26/2022   Shortness of breath 05/21/2022   COVID-19 virus infection 03/17/2020   COPD with acute exacerbation (HCC) 03/17/2020   Type 2 diabetes mellitus (HCC) 03/17/2020   HTN (hypertension) 03/17/2020   Vitamin B12 deficiency 10/05/2019   Erectile dysfunction 11/10/2017   CKD stage 3b, GFR 30-44 ml/min (HCC) 09/19/2017   Mild intermittent asthma without complication 09/19/2017   Chronic cough 10/22/2016   Epigastric pain 06/30/2015   Chronic bilateral low back pain without sciatica 05/31/2015   Neuropathic pain 03/01/2015   Benign prostatic hyperplasia with lower urinary tract symptoms 04/06/2014   Adenomatous colon polyp 08/31/2013   Depression 06/21/2013   Foot pain, bilateral 05/07/2013   Insomnia 03/24/2013   Vitiligo 12/23/2012   Primary localized osteoarthrosis, lower leg 01/15/2011   Pain medication agreement broken 09/19/2010   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    Orientation RESPIRATION BLADDER Height & Weight     Self, Time, Situation, Place  Normal Incontinent, External catheter Weight: 135 lb (61.2 kg) Height:  5' 8.5 (174 cm)  BEHAVIORAL SYMPTOMS/MOOD NEUROLOGICAL BOWEL NUTRITION STATUS      Continent Diet (See dc summary)  AMBULATORY STATUS COMMUNICATION OF NEEDS Skin   Extensive Assist Verbally Other (Comment) (Knee infection)                       Personal Care Assistance Level of Assistance  Bathing, Feeding, Dressing Bathing Assistance: Maximum assistance Feeding assistance: Limited assistance Dressing Assistance: Limited assistance     Functional Limitations Info  Sight Sight Info: Impaired (glasses)        SPECIAL CARE FACTORS FREQUENCY  PT (By licensed PT), OT (By licensed OT)     PT Frequency: 5x/week OT Frequency: 5x/week            Contractures Contractures Info: Not present    Additional Factors Info   Code Status, Allergies Code Status Info: Full Allergies Info: Losartan, Chicken Allergy, Poultry Meal           Current Medications (02/02/2024):  This is the current hospital active medication list Current Facility-Administered Medications  Medication Dose Route Frequency Provider Last Rate Last Admin   (feeding supplement) PROSource Plus liquid 30 mL  30 mL Oral BID BM Danton Domino A, PA-C   30 mL at 01/31/24 1345   acetaminophen  (TYLENOL ) tablet 650 mg  650 mg Oral Q6H PRN McClung, Sarah A, PA-C       alum & mag hydroxide-simeth (MAALOX/MYLANTA) 200-200-20 MG/5ML suspension 30 mL  30 mL Oral Q4H PRN Elgergawy, Dawood S, MD   30 mL at 02/01/24 2051   apixaban  (ELIQUIS ) tablet 5 mg  5 mg Oral BID Danton Domino LABOR, PA-C   5 mg at 02/02/24 9097   budesonide -glycopyrrolate -formoterol  (BREZTRI ) 160-9-4.8 MCG/ACT inhaler 2 puff  2 puff Inhalation BID Danton Domino LABOR, PA-C   2 puff at 02/02/24 0846   carvedilol  (COREG ) tablet 3.125 mg  3.125 mg Oral BID WC Danton Domino LABOR, PA-C   3.125 mg at 02/02/24 9096   ceFAZolin  (ANCEF ) IVPB 2g/100 mL premix  2 g Intravenous Q8H Danton Domino LABOR, PA-C 200 mL/hr at 02/02/24 0455 2 g at 02/02/24 0455   cholecalciferol  (VITAMIN D3) 25 MCG (1000 UNIT) tablet 1,000 Units  1,000 Units Oral Daily Danton Domino LABOR, PA-C   1,000 Units at 02/02/24 9096   cyanocobalamin  (VITAMIN B12) injection 1,000 mcg  1,000 mcg Subcutaneous Daily Danton Domino LABOR, PA-C   1,000 mcg at 02/02/24 9089   Followed by   [name] ON 02/05/2024] cyanocobalamin  (VITAMIN B12) injection 1,000 mcg  1,000 mcg Subcutaneous Q30 days Danton Domino LABOR, PA-C       docusate sodium  (COLACE) capsule 100 mg  100 mg Oral BID Danton Domino LABOR, PA-C   100 mg at 02/01/24 2053   feeding supplement (GLUCERNA SHAKE) (GLUCERNA SHAKE) liquid 237 mL  237 mL Oral TID BM Danton Domino A, PA-C   237 mL at 01/31/24 2050   gabapentin  (NEURONTIN ) capsule 300 mg  300 mg Oral QHS Danton Domino A, PA-C   300 mg at  02/01/24 2052   guaiFENesin -dextromethorphan  (ROBITUSSIN DM) 100-10 MG/5ML syrup 5 mL  5 mL Oral Q4H PRN Elgergawy, Dawood S, MD   5 mL at 02/02/24 0915   insulin  aspart (novoLOG ) injection 0-5 Units  0-5 Units Subcutaneous QHS Danton Domino LABOR, PA-C   3 Units at 01/31/24 2142   insulin  aspart (novoLOG ) injection 0-9 Units  0-9 Units Subcutaneous TID WC Danton Domino LABOR, PA-C   1 Units at 02/02/24 9091   insulin  glargine (LANTUS ) injection 5 Units  5 Units Subcutaneous Daily Elgergawy, Dawood S, MD   5 Units at 02/02/24 0908   melatonin tablet 3 mg  3 mg Oral QHS PRN Danton Domino LABOR, PA-C   3 mg at 02/01/24 2304   methocarbamol  (ROBAXIN ) tablet 500 mg  500 mg Oral Q6H PRN Danton Domino LABOR, PA-C   500 mg at 02/02/24 9097   metoCLOPramide  (REGLAN ) tablet 5-10 mg  5-10 mg Oral Q8H PRN  Danton Lauraine LABOR, PA-C       Or   metoCLOPramide  (REGLAN ) injection 5-10 mg  5-10 mg Intravenous Q8H PRN Danton Lauraine LABOR, PA-C       multivitamin with minerals tablet 1 tablet  1 tablet Oral Daily Danton Lauraine LABOR, PA-C   1 tablet at 02/02/24 0903   ondansetron  (ZOFRAN ) tablet 4 mg  4 mg Oral Q6H PRN Danton Lauraine LABOR, PA-C       Or   ondansetron  (ZOFRAN ) injection 4 mg  4 mg Intravenous Q6H PRN Danton Lauraine LABOR, PA-C       oxyCODONE  (Oxy IR/ROXICODONE ) immediate release tablet 5 mg  5 mg Oral Q4H PRN Elgergawy, Dawood S, MD   5 mg at 02/02/24 9096   pantoprazole  (PROTONIX ) EC tablet 40 mg  40 mg Oral Daily Danton Lauraine LABOR, PA-C   40 mg at 02/02/24 9097   polyethylene glycol (MIRALAX  / GLYCOLAX ) packet 17 g  17 g Oral Daily Danton Lauraine LABOR, PA-C   17 g at 01/31/24 9073   polyethylene glycol (MIRALAX  / GLYCOLAX ) packet 17 g  17 g Oral Daily PRN Danton Lauraine LABOR, PA-C       sodium chloride  flush (NS) 0.9 % injection 3 mL  3 mL Intravenous Q12H Danton Lauraine LABOR, PA-C   3 mL at 02/02/24 9092   tamsulosin  (FLOMAX ) capsule 0.4 mg  0.4 mg Oral Daily Danton Lauraine LABOR, PA-C   0.4 mg at 02/02/24 9097   thiamine  (VITAMIN B1)  tablet 100 mg  100 mg Per Tube Daily Danton Lauraine LABOR, PA-C   100 mg at 02/02/24 9097   Vitamin D  (Ergocalciferol ) (DRISDOL ) 1.25 MG (50000 UNIT) capsule 50,000 Units  50,000 Units Oral Q7 days Danton Lauraine LABOR, PA-C   50,000 Units at 01/28/24 1023     Discharge Medications: Please see discharge summary for a list of discharge medications.  Relevant Imaging Results:  Relevant Lab Results:   Additional Information SSN [id number]. Will require cefazolin  2g IV q8h until 03/12/24.  Inocente GORMAN Kindle, LCSW     "

## 2024-02-02 NOTE — Plan of Care (Signed)
" °  Problem: Education: Goal: Knowledge of General Education information will improve Description: Including pain rating scale, medication(s)/side effects and non-pharmacologic comfort measures Outcome: Progressing   Problem: Clinical Measurements: Goal: Will remain free from infection Outcome: Progressing Goal: Diagnostic test results will improve Outcome: Progressing   Problem: Activity: Goal: Risk for activity intolerance will decrease Outcome: Progressing   Problem: Nutrition: Goal: Adequate nutrition will be maintained Outcome: Progressing   Problem: Coping: Goal: Level of anxiety will decrease Outcome: Progressing   Problem: Skin Integrity: Goal: Risk for impaired skin integrity will decrease Outcome: Progressing   "

## 2024-02-03 DIAGNOSIS — M009 Pyogenic arthritis, unspecified: Secondary | ICD-10-CM | POA: Diagnosis not present

## 2024-02-03 LAB — VITAMIN C: Vitamin C: 0.6 mg/dL (ref 0.4–2.0)

## 2024-02-03 LAB — GLUCOSE, CAPILLARY
Glucose-Capillary: 146 mg/dL — ABNORMAL HIGH (ref 70–99)
Glucose-Capillary: 97 mg/dL (ref 70–99)
Glucose-Capillary: 201 mg/dL — ABNORMAL HIGH (ref 70–99)

## 2024-02-03 MED ORDER — HEPARIN SOD (PORK) LOCK FLUSH 100 UNIT/ML IV SOLN
250.0000 [IU] | INTRAVENOUS | Status: AC | PRN
  Administered 2024-02-03: 250 [IU]

## 2024-02-03 MED ORDER — PROSOURCE PLUS PO LIQD: 30.0000 mL | Freq: Two times a day (BID) | ORAL | Status: AC

## 2024-02-03 MED ORDER — DOCUSATE SODIUM 100 MG PO CAPS: 100.0000 mg | ORAL_CAPSULE | Freq: Two times a day (BID) | ORAL | Status: AC

## 2024-02-03 MED ORDER — CARVEDILOL 3.125 MG PO TABS: 3.1250 mg | ORAL_TABLET | Freq: Two times a day (BID) | ORAL | Status: AC

## 2024-02-03 MED ORDER — OXYCODONE HCL 5 MG PO TABS: 5.0000 mg | ORAL_TABLET | ORAL | 0 refills | Status: DC | PRN

## 2024-02-03 MED ORDER — VITAMIN B-12 1000 MCG PO TABS: 1000.0000 ug | ORAL_TABLET | Freq: Every day | ORAL | Status: AC

## 2024-02-03 MED ORDER — ALUM & MAG HYDROXIDE-SIMETH 200-200-20 MG/5ML PO SUSP: 30.0000 mL | ORAL | Status: AC | PRN

## 2024-02-03 MED ORDER — CEFAZOLIN SODIUM-DEXTROSE 2-4 GM/100ML-% IV SOLN: 2.0000 g | Freq: Three times a day (TID) | INTRAVENOUS | Status: AC

## 2024-02-03 MED ORDER — ADULT MULTIVITAMIN W/MINERALS CH: 1.0000 | ORAL_TABLET | Freq: Every day | ORAL | Status: AC

## 2024-02-03 MED ORDER — GLUCERNA SHAKE PO LIQD: 237.0000 mL | Freq: Three times a day (TID) | ORAL | Status: AC

## 2024-02-03 MED ORDER — VITAMIN D (ERGOCALCIFEROL) 1.25 MG (50000 UNIT) PO CAPS: 50000.0000 [IU] | ORAL_CAPSULE | ORAL | Status: AC

## 2024-02-03 MED ORDER — INSULIN ASPART 100 UNIT/ML IJ SOLN: 0.0000 [IU] | Freq: Three times a day (TID) | INTRAMUSCULAR | Status: AC

## 2024-02-03 MED ORDER — MELATONIN 3 MG PO TABS: 3.0000 mg | ORAL_TABLET | Freq: Every evening | ORAL | Status: AC | PRN

## 2024-02-03 MED ORDER — INSULIN ASPART 100 UNIT/ML IJ SOLN: 0.0000 [IU] | Freq: Every day | INTRAMUSCULAR | Status: AC

## 2024-02-03 NOTE — Progress Notes (Signed)
 Pt is ready to be discharged , PTAR here to get pt, vitals stable packet has been given to PTAR. Has no concerns at this time . Belongings sent with pt and accounted for. Vitals stable.   02/03/24 1934  Vitals  Temp (!) 97.5 F (36.4 C)  Temp Source Oral  BP 116/82  MAP (mmHg) 93  BP Location Left Arm  BP Method Automatic  Patient Position (if appropriate) Lying  Pulse Rate 73  Pulse Rate Source Monitor  Resp 18  Level of Consciousness  Level of Consciousness Alert  MEWS COLOR  MEWS Score Color Green  Oxygen Therapy  SpO2 97 %  O2 Device Nasal Cannula  O2 Flow Rate (L/min) 2 L/min  MEWS Score  MEWS Temp 0  MEWS Systolic 0  MEWS Pulse 0  MEWS RR 0  MEWS LOC 0  MEWS Score 0

## 2024-02-03 NOTE — Progress Notes (Signed)
 Report called to [place], LPN at [facility].

## 2024-02-03 NOTE — Plan of Care (Signed)

## 2024-02-03 NOTE — TOC Progression Note (Addendum)
 Transition of Care [facility]) - Progression Note    Patient Details  Name: Joe Coleman MRN: [id number] Date of Birth: 1945/04/09  Transition of Care [facility]) CM/SW Contact  Inocente GORMAN Kindle, LCSW Phone Number: 02/03/2024, 9:03 AM  Clinical Narrative:    9am-Insurance approval received for [employer], Ref# [id number], effective 02/03/2024-02/09/2024.   Awaiting confirmation from [facility] that they can accept patient today. Confirmed patient has PICC.   9:19 AM-[facility] able to accept patient today if stable.  CSW met with patient and assisted him to contact his niece, [name], who was able to give him his brother's new phone number. Patient requires PTAR for transport.    Expected Discharge Plan: Skilled Nursing Facility Barriers to Discharge: Continued Medical Work up               Expected Discharge Plan and Services In-house Referral: Clinical Social Work   Post Acute Care Choice: Skilled Nursing Facility Living arrangements for the past 2 months: Apartment                                       Social Drivers of Health (SDOH) Interventions SDOH Screenings   Food Insecurity: No Food Insecurity (01/26/2024)  Housing: Low Risk (01/26/2024)  Transportation Needs: No Transportation Needs (01/26/2024)  Utilities: Not At Risk (01/26/2024)  Financial Resource Strain: Medium Risk (05/02/2021)   Received from [facility]  Social Connections: Unknown (01/26/2024)  Tobacco Use: Medium Risk (01/30/2024)  Health Literacy: High Risk (05/04/2022)   Received from [facility]    Readmission Risk Interventions    02/02/2024   12:42 PM 09/01/2023   12:00 PM  Readmission Risk Prevention Plan  Transportation Screening Complete Complete  Medication Review [facility]) Complete Complete  PCP or Specialist appointment within 3-5 days of discharge Complete Complete  HRI or Home Care Consult Complete Complete  SW Recovery Care/Counseling Consult Complete Complete   Palliative Care Screening Not Applicable Not Applicable  Skilled Nursing Facility Complete Complete

## 2024-02-03 NOTE — Progress Notes (Signed)
 Patient refused to be transported by PTAR at this time and told them to come back after he finished eating.

## 2024-02-03 NOTE — Progress Notes (Signed)
 Physical Therapy Treatment Patient Details Name: Joe Coleman MRN: [id number] DOB: 07-13-1945 Today's Date: 02/03/2024   History of Present Illness Pt is 78 y.o. male transfered from [facility] ED to [facility] 01/26/24 due to increased pain, redness, and drainage at prior R LE wound below knee with CT of R LE in [facility] ED revealing a moderate to large thick-walled joint effusion as well as a comminuted proximal tibia fracture. S/p R knee aspiration on 12/23 and R knee I&D on 12/26. PMHx: T2DM, COPD, HTN, CKD IIIa, BPH, GERD, past tobacco abuse, and hx R tib fx s/p ORIF (7/25), wound infection (8/28), wound dehiscence with surgical hardware visible (9/9), s/p removal of hardware, I&D, skin graft, and wound vac placement (9/10), wound vac change (9/23). Hospitalization 9/9-9/19 for right leg wound with infected hardware d/t Enterobacter & Klebsiella, placed on IV antibiotics as well as acute LLE distal DVT started on Eliquis . Hospitalization 9/24-9/30 with AMS, AKI, and rhabdomyolysis    PT Comments  Pt received in supine and agreeable to session. Pt continues to be limited by R knee pain that increases with WB. Pt able to pivot to recliner with 1 seated rest break due to increased pain. Pt requires increased cues for direction with pt following inconsistently. Pt noted to be orthostatic with pt reporting dizziness during transfer and BP 90/54 once sitting in recliner. Pt continues to benefit from PT services to progress toward functional mobility goals.     If plan is discharge home, recommend the following: A lot of help with walking and/or transfers;A little help with bathing/dressing/bathroom;Assistance with cooking/housework;Assist for transportation;Help with stairs or ramp for entrance   Can travel by private vehicle     No  Equipment Recommendations  Rolling walker (2 wheels);BSC/3in1    Recommendations for Other Services       Precautions / Restrictions Precautions Precautions: Fall Recall of  Precautions/Restrictions: Intact Precaution/Restrictions Comments: watch BP Restrictions Weight Bearing Restrictions Per Provider Order: No RLE Weight Bearing Per Provider Order: Weight bearing as tolerated     Mobility  Bed Mobility Overal bed mobility: Needs Assistance Bed Mobility: Supine to Sit     Supine to sit: Min assist, Used rails, HOB elevated     General bed mobility comments: increased time and cues for sequencing    Transfers Overall transfer level: Needs assistance Equipment used: Rolling walker (2 wheels) Transfers: Sit to/from Stand, Bed to chair/wheelchair/BSC Sit to Stand: Mod assist, +2 physical assistance, +2 safety/equipment   Step pivot transfers: Mod assist, +2 safety/equipment, +2 physical assistance       General transfer comment: Mod assist +2 to power up from EOB and cues for hand placement/technique with RW; assist to guide RW during transfer.Limited RLE WB tolerance and pt reports increased L knee pain during transfer. Pt requires seated rest break during transfer requiring max cues for safety and assist to bring bed behind pt. pt able to pivot to recliner during second attempt with improved L foot clearance. Pt maintains flexed trunk despite cues.    Ambulation/Gait                   Stairs             Wheelchair Mobility     Tilt Bed    Modified Rankin (Stroke Patients Only)       Balance Overall balance assessment: Needs assistance Sitting-balance support: No upper extremity supported, Feet supported Sitting balance-Leahy Scale: Good Sitting balance - Comments: sitting EOB   Standing  balance support: Bilateral upper extremity supported, During functional activity, Reliant on assistive device for balance Standing balance-Leahy Scale: Poor Standing balance comment: reliant on RW support to offload RLE                            Communication Communication Communication: Impaired Factors Affecting  Communication: Reduced clarity of speech  Cognition Arousal: Alert Behavior During Therapy: WFL for tasks assessed/performed   PT - Cognitive impairments: Safety/Judgement                       PT - Cognition Comments: Pt attempting to sit without surface behind him despite cues. Increased cues for sequencing and direction Following commands: Impaired Following commands impaired: Follows one step commands with increased time    Cueing Cueing Techniques: Verbal cues, Gestural cues, Tactile cues, Visual cues  Exercises      General Comments General comments (skin integrity, edema, etc.): Pt reports dizziness during transfer and BP 90/54 once seated in recliner      Pertinent Vitals/Pain Pain Assessment Pain Assessment: Faces Faces Pain Scale: Hurts even more Pain Location: R knee, L knee with WB Pain Descriptors / Indicators: Aching, Grimacing, Guarding Pain Intervention(s): Limited activity within patient's tolerance, Monitored during session, Repositioned, Premedicated before session     PT Goals (current goals can now be found in the care plan section) Acute Rehab PT Goals Patient Stated Goal: Decrease pain PT Goal Formulation: With patient Time For Goal Achievement: 02/14/24 Progress towards PT goals: Progressing toward goals    Frequency    Min 2X/week       AM-PAC PT 6 Clicks Mobility   Outcome Measure  Help needed turning from your back to your side while in a flat bed without using bedrails?: None Help needed moving from lying on your back to sitting on the side of a flat bed without using bedrails?: A Little Help needed moving to and from a bed to a chair (including a wheelchair)?: A Lot Help needed standing up from a chair using your arms (e.g., wheelchair or bedside chair)?: A Lot Help needed to walk in hospital room?: Total Help needed climbing 3-5 steps with a railing? : Total 6 Click Score: 13    End of Session Equipment Utilized During  Treatment: Gait belt Activity Tolerance: Patient limited by pain;Patient limited by fatigue Patient left: in chair;with call bell/phone within reach;with chair alarm set Nurse Communication: Mobility status PT Visit Diagnosis: Unsteadiness on feet (R26.81);Muscle weakness (generalized) (M62.81);Pain Pain - Right/Left: Right Pain - part of body: Knee     Time: 9061-8997 PT Time Calculation (min) (ACUTE ONLY): 24 min  Charges:    $Therapeutic Activity: 23-37 mins PT General Charges $$ ACUTE PT VISIT: 1 Visit                    Darryle George, PTA Acute Rehabilitation Services Secure Chat Preferred  [phone]    Darryle George 02/03/2024, 12:04 PM

## 2024-02-03 NOTE — Discharge Summary (Signed)
 Physician Discharge Summary  Joe Coleman FMW:[id number] DOB: [birth year] DOA: 01/26/2024  PCP: [facility]  Admit date: 01/26/2024 Discharge date: 02/03/2024    Recommendations for Outpatient Follow-up:  Please obtain BMP/CBC in one week To follow with orthopedic and ID as an outpatient.  Diet recommendation: Heart Healthy / Carb Modified /   Brief/Interim Summary:  78 y.o. male with medical history significant of diabetes mellitus 2 on metformin , BPH on Flomax , CKD 3A, hypertension, GERD, COPD and history of DVT on Eliquis .  Patient was hospitalized back in September 2025 due to infected hardware in the right knee.  He underwent removal of hardware.  Wound cultures at that time positive for pansensitive Enterobacter and Klebsiella.  He was discharged home on linezolid  and doxycycline  as well as 25 days of IV ertapenem  to treat underlying osteomyelitis.  Readmitted to the hospital in November 2025 due to sepsis secondary to pneumonia.  He was discharged on Augmentin  as well as supportive care.  He presents again with septic right knee with effusion, please see discussion below, plan to discharge with prolonged antibiotic to SNF.  Septic right knee with effusion Previous infection with osteomyelitis requiring removal of prior knee hardware in September 2025 and was treated with prolonged oral and IV antibiotics.  Cultures during that admission were positive for pansensitive Enterobacter and Klebsiella.   - Orthopedic input greatly appreciated, status post arthrocentesis 12/23, with cell count of 69,000, so went for I&D 01/30/2024. -Intraoperative cultures with no growth seen, but arthrocentesis fluid growing MSSA, antibiotic management per ID, currently on cefazolin .  Will stop Dilaudid  03/13/2023. - Postop recommendation per orthopedic, patient can work with PT, OT, okay to resume his Eliquis , weightbearing as tolerated - PICC line inserted, discharged on cefazolin , to  follow-up with Ortho and ID as an outpatient     AKI on CKD IIIa baseline creatinine around 1.3, proved with hydration  History of DVT on Eliquis ,  On Eliquis    Normocytic anemia - received 1 unit PRBC for hemoglobin of 7.6, hemoglobin stable posttransfusion   BPH Continue Flomax    Hypertension placed on low-dose Coreg .   COPD  Continue as needed albuterol  neb, On Trelegy at home   GERD  Continue PPI   Severe protein calorie malnutrition Current BMI 19.49, add protein supplement, patient is consulted   Hypophosphatemia - Replaced   Diabetes mellitus 2  -metformin  at home, currently on insulin  sliding scale.      Discharge Diagnoses:  Principal Problem:   Septic arthritis of knee, right (HCC) Active Problems:   Protein-calorie malnutrition, severe    Discharge Instructions  Discharge Instructions     Discharge wound care:   Complete by: As directed    Dressing changes as needed with mepitel/adaptic, 4x4 gauze, and kerlix. Right knee   Home infusion instructions   Complete by: As directed    Instructions: Flushing of vascular access device: 0.9% NaCl pre/post medication administration and prn patency; Heparin  100 u/ml, 5ml for implanted ports and Heparin  10u/ml, 5ml for all other central venous catheters.   Increase activity slowly   Complete by: As directed       Allergies as of 02/03/2024       Reactions   Losartan Other (See Comments)   Hyperkalemia (>5.5 multiple times on losartan 25mg )   Chicken Allergy Nausea And Vomiting   Poultry Meal Nausea And Vomiting        Medication List     STOP taking these medications    metFORMIN   500 MG tablet Commonly known as: GLUCOPHAGE    oxyCODONE -acetaminophen  5-325 MG tablet Commonly known as: PERCOCET/ROXICET       TAKE these medications    (feeding supplement) PROSource Plus liquid Take 30 mLs by mouth 2 (two) times daily between meals. What changed: You were already taking a medication with the  same name, and this prescription was added. Make sure you understand how and when to take each.   feeding supplement (GLUCERNA SHAKE) Liqd Take 237 mLs by mouth 3 (three) times daily between meals. What changed: Another medication with the same name was added. Make sure you understand how and when to take each.   acetaminophen  325 MG tablet Commonly known as: TYLENOL  Take 2 tablets (650 mg total) by mouth every 6 (six) hours as needed for mild pain (pain score 1-3) or fever (or Fever >/= 101).   albuterol  108 (90 Base) MCG/ACT inhaler Commonly known as: VENTOLIN  HFA Inhale 2 puffs into the lungs every 6 (six) hours as needed for wheezing or shortness of breath.   alum & mag hydroxide-simeth 200-200-20 MG/5ML suspension Commonly known as: MAALOX/MYLANTA Take 30 mLs by mouth every 4 (four) hours as needed for indigestion.   carvedilol  3.125 MG tablet Commonly known as: COREG  Take 1 tablet (3.125 mg total) by mouth 2 (two) times daily with a meal.   ceFAZolin  2-4 GM/100ML-% IVPB Commonly known as: ANCEF  Inject 100 mLs (2 g total) into the vein every 8 (eight) hours.   chlorhexidine  4 % external liquid Commonly known as: HIBICLENS  Apply 15 mLs (1 Application total) topically as directed for 30 doses. Use as directed daily for 5 days every other week for 6 weeks.   cyanocobalamin  1000 MCG tablet Commonly known as: VITAMIN B12 Take 1 tablet (1,000 mcg total) by mouth daily.   docusate sodium  100 MG capsule Commonly known as: COLACE Take 1 capsule (100 mg total) by mouth 2 (two) times daily.   Eliquis  5 MG Tabs tablet Generic drug: apixaban  Take 1 tablet (5 mg total) by mouth 2 (two) times daily.   gabapentin  300 MG capsule Commonly known as: NEURONTIN  Take 300 mg by mouth at bedtime.   insulin  aspart 100 UNIT/ML injection Commonly known as: novoLOG  Inject 0-5 Units into the skin at bedtime. insulin  aspart (novoLOG ) injection 0-5 Units  0-5 Units, Subcutaneous, Daily at  bedtime, First dose on Mon 01/26/24 at 2200 Correction coverage: HS scale CBG < 70: Implement Hypoglycemia Standing Orders and refer to Hypoglycemia Standing Orders sidebar report CBG 70 - 120: 0 units CBG 121 - 150: 0 units CBG 151 - 200: 0 units CBG 201 - 250: 2 units CBG 251 - 300: 3 units CBG 301 - 350: 4 units CBG 351 - 400: 5 units CBG > 400: call MD and obtain STAT lab verification   insulin  aspart 100 UNIT/ML injection Commonly known as: novoLOG  Inject 0-9 Units into the skin 3 (three) times daily with meals. 0-9 Units, Subcutaneous, 3 times daily with meals, First dose on Mon 01/26/24 at 1700 Correction coverage: Sensitive (thin, NPO, renal) CBG < 70: Implement Hypoglycemia Standing Orders and refer to Hypoglycemia Standing Orders sidebar report CBG 70 - 120: 0 units CBG 121 - 150: 1 unit CBG 151 - 200: 2 units CBG 201 - 250: 3 units CBG 251 - 300: 5 units CBG 301 - 350: 7 units CBG 351 - 400: 9 units CBG > 400: call MD and obtain STAT lab verification   melatonin 3 MG Tabs tablet Take  1 tablet (3 mg total) by mouth at bedtime as needed.   methocarbamol  500 MG tablet Commonly known as: ROBAXIN  Take 1 tablet (500 mg total) by mouth every 6 (six) hours as needed for muscle spasms.   multivitamin with minerals Tabs tablet Take 1 tablet by mouth daily. Start taking on: February 04, 2024   mupirocin  ointment 2 % Commonly known as: BACTROBAN  Place 1 Application into the nose 2 (two) times daily for 60 doses. Use as directed 2 times daily for 5 days every other week for 6 weeks.   ondansetron  4 MG tablet Commonly known as: ZOFRAN  Take 1 tablet (4 mg total) by mouth every 6 (six) hours as needed for nausea.   oxyCODONE  5 MG immediate release tablet Commonly known as: Oxy IR/ROXICODONE  Take 1 tablet (5 mg total) by mouth every 4 (four) hours as needed for severe pain (pain score 7-10).   pantoprazole  20 MG tablet Commonly known as: PROTONIX  Take 2 tablets (40 mg total) by mouth daily  for 14 days.   polyethylene glycol 17 g packet Commonly known as: MIRALAX  / GLYCOLAX  Take 17 g by mouth daily.   tamsulosin  0.4 MG Caps capsule Commonly known as: FLOMAX  Take 1 capsule (0.4 mg total) by mouth daily.   Trelegy Ellipta 200-62.5-25 MCG/ACT Aepb Generic drug: Fluticasone-Umeclidin-Vilant Inhale 1 puff into the lungs daily.   Vitamin D  (Ergocalciferol ) 1.25 MG (50000 UNIT) Caps capsule Commonly known as: DRISDOL  Take 1 capsule (50,000 Units total) by mouth every 7 (seven) days. Start taking on: February 04, 2024   vitamin D3 25 MCG tablet Commonly known as: CHOLECALCIFEROL  Take 1 tablet (1,000 Units total) by mouth daily.               Home Infusion Instuctions  (From admission, onward)           Start     Ordered   02/03/24 0000  Home infusion instructions       Question:  Instructions  Answer:  Flushing of vascular access device: 0.9% NaCl pre/post medication administration and prn patency; Heparin  100 u/ml, 5ml for implanted ports and Heparin  10u/ml, 5ml for all other central venous catheters.   02/03/24 1318              Discharge Care Instructions  (From admission, onward)           Start     Ordered   02/03/24 0000  Discharge wound care:       Comments: Dressing changes as needed with mepitel/adaptic, 4x4 gauze, and kerlix. Right knee   02/03/24 1320            Contact information for follow-up providers     Haddix, Franky SQUIBB, MD. Schedule an appointment as soon as possible for a visit in 2 week(s).   Specialty: Orthopedic Surgery Why: for wound check and repeat x-rays Contact information: [address] [place] [address] [phone]              Contact information for after-discharge care     Destination     [facility], [place] .   Service: Skilled Nursing Contact information: [address] [place]  [address] [phone]                     Allergies[1]  Consultations: [name] from orthopedics Infectious disease   Procedures/Studies: US  EKG SITE RITE Result Date: 02/02/2024 If Site Rite image not attached, placement could not be  confirmed due to current cardiac rhythm.  CT KNEE RIGHT W CONTRAST Result Date: 01/24/2024 CLINICAL DATA:  Provided history: looks like an infected right knee s/p recent washout. now with purulent drainainge EXAM: CT OF THE RIGHT KNEE WITH CONTRAST TECHNIQUE: Multidetector CT imaging was performed following the standard protocol during bolus administration of intravenous contrast. RADIATION DOSE REDUCTION: This exam was performed according to the departmental dose-optimization program which includes automated exposure control, adjustment of the mA and/or kV according to patient size and/or use of iterative reconstruction technique. CONTRAST:  80mL OMNIPAQUE  IOHEXOL  300 MG/ML  SOLN COMPARISON:  Radiograph earlier today, additional prior radiographs reviewed. FINDINGS: Bones/Joint/Cartilage Knee arthroplasty in expected alignment. Prior patellar resurfacing. Comminuted proximal tibial fracture extends to the lateral aspect of the tibial tray as well as the cement adjacent to the tibial stem. Comminuted metaphyseal and diaphyseal component which is not entirely included in the field of view. There is no bony bridging. Proximal fibular fracture has incomplete surrounding callus formation. Moderate to large thick-walled joint effusion with synovial enhancement. Ligaments Suboptimally assessed by CT. Muscles and Tendons No intramuscular collection. Slightly indistinct fatty striations in the anterior compartment adjacent to tibial fracture. Soft tissues Generalized soft tissue edema. No focal fluid collection. No soft tissue gas. Arterial vascular calcifications are seen. IMPRESSION: 1. Knee arthroplasty in expected alignment. Comminuted proximal tibial fracture extends to the lateral aspect of the tibial tray as  well as the cement adjacent to the tibial stem. Tibial metaphyseal and tibial shaft component demonstrates persistent fracture lucency and no bony bridging, suspicious for delayed/nonunion. 2. Proximal fibular fracture has incomplete surrounding callus formation. 3. Moderate to large thick-walled joint effusion with synovial enhancement. This may be inflammatory or infectious, consider fluid sampling. 4. Generalized soft tissue edema. No focal fluid collection. No soft tissue gas. Electronically Signed   By: Andrea Gasman M.D.   On: 01/24/2024 16:39   DG Chest Port 1 View Result Date: 01/24/2024 CLINICAL DATA:  Provided history: Questionable sepsis - evaluate for abnormality EXAM: PORTABLE CHEST 1 VIEW COMPARISON:  Radiographs.  CT 12/20/2023 FINDINGS: Increasing patchy opacity at the left lung base. Background bronchial thickening. The heart is stable in size. No pulmonary edema. No significant pleural effusion. No pneumothorax. Surgical clips in the upper abdomen. IMPRESSION: 1. Increasing patchy opacity at the left lung base, atelectasis versus pneumonia. 2. Background bronchial thickening. Electronically Signed   By: Andrea Gasman M.D.   On: 01/24/2024 14:53   DG Knee Complete 4 Views Right Result Date: 01/24/2024 EXAM: 4 OR MORE VIEW(S) XRAY OF THE RIGHT KNEE 01/24/2024 01:19:12 PM COMPARISON: None available. CLINICAL HISTORY: fall FINDINGS: BONES AND JOINTS: Total right knee arthroplasty in place. Chronic fracture deformities involving the proximal shaft of the fibula and periprosthetic proximal tibia. There has been slight medial displacement of the distal tibial fracture fragments compared with a new sharply demarcated fracture line noted along the medial cortex of the proximal right tibia. Effusion within the suprapatellar bursa. SOFT TISSUES: Vascular calcifications. IMPRESSION: 1. Findings suspicious for an acute on chronic periprosthetic proximal tibial fracture, with slight medial  displacement of the distal tibial fracture fragments and a new sharply demarcated fracture line along the medial cortex of the proximal right tibia, with chronic fracture deformity of the proximal fibular shaft. 2. Effusion within the suprapatellar bursa. 3. Total right knee arthroplasty in place. Electronically signed by: Waddell Calk MD 01/24/2024 01:39 PM EST RP Workstation: [id number]   DG Tibia/Fibula Right Result Date: 01/24/2024 CLINICAL DATA:  Right  leg pain after fall. EXAM: RIGHT TIBIA AND FIBULA - 2 VIEW COMPARISON:  Tibia/fibular radiographs 10/30/2023 FINDINGS: Knee arthroplasty in place. Redemonstrated comminuted mildly displaced tibial fracture involving the proximal tibia extending to the tibial component of knee arthroplasty is well as the tibial shaft. There also ghost tracks within the tibial shaft. Some periosteal new bone is noted about the proximal medial aspect of the fracture, however no solid bony bridging. Majority of the fracture line remains readily visible. Displaced proximal fibular shaft fracture is unchanged in alignment. No new fracture. Ankle alignment is maintained. Mild generalized soft tissue edema. IMPRESSION: 1. Unchanged alignment of comminuted displaced tibial fracture compared to September exam, proximal extent involving the tibial component of knee arthroplasty. Some periosteal new bone is noted about the proximal medial aspect of the fracture, however no solid bony bridging. 2. Unchanged alignment of displaced proximal fibular shaft fracture. 3. No new fracture of the lower leg. Electronically Signed   By: Andrea Gasman M.D.   On: 01/24/2024 13:35      Subjective: No significant events overnight, reports postop pain, I have asked him not to wait too long before asking for his pain medicine, as he can get it every 4 hours and on average she has been getting it every 6-8 hours.  Discharge Exam: Vitals:   02/03/24 0854 02/03/24 1111  BP:  97/62  Pulse: 82  69  Resp: 18 20  Temp:  98.1 F (36.7 C)  SpO2: 93%    Vitals:   02/03/24 0400 02/03/24 0748 02/03/24 0854 02/03/24 1111  BP: (!) 118/58 122/63  97/62  Pulse: 66 67 82 69  Resp: 16 16 18 20   Temp: 98 F (36.7 C) 97.9 F (36.6 C)  98.1 F (36.7 C)  TempSrc: Oral Oral  Oral  SpO2: 99% 98% 93%   Weight:      Height:        General: Pt is alert, awake, frail, deconditioned Cardiovascular: RRR, S1/S2 +, no rubs, no gallops Respiratory: CTA bilaterally, no wheezing, no rhonchi Abdominal: Soft, NT, ND, bowel sounds + Extremities: Right lower extremity bandaged, +1 edema    The results of significant diagnostics from this hospitalization (including imaging, microbiology, ancillary and laboratory) are listed below for reference.     Microbiology: Recent Results (from the past 240 hours)  Resp panel by RT-PCR (RSV, Flu A&B, Covid) Anterior Nasal Swab     Status: None   Collection Time: 01/24/24  3:08 PM   Specimen: Anterior Nasal Swab  Result Value Ref Range Status   SARS Coronavirus 2 by RT PCR NEGATIVE NEGATIVE Final    Comment: (NOTE) SARS-CoV-2 target nucleic acids are NOT DETECTED.  The SARS-CoV-2 RNA is generally detectable in upper respiratory specimens during the acute phase of infection. The lowest concentration of SARS-CoV-2 viral copies this assay can detect is 138 copies/mL. A negative result does not preclude SARS-Cov-2 infection and should not be used as the sole basis for treatment or other patient management decisions. A negative result may occur with  improper specimen collection/handling, submission of specimen other than nasopharyngeal swab, presence of viral mutation(s) within the areas targeted by this assay, and inadequate number of viral copies(<138 copies/mL). A negative result must be combined with clinical observations, patient history, and epidemiological information. The expected result is Negative.  Fact Sheet for Patients:   bloggercourse.com  Fact Sheet for Healthcare Providers:  seriousbroker.it  This test is no t yet approved or cleared by the [place]  FDA  and  has been authorized for detection and/or diagnosis of SARS-CoV-2 by FDA under an Emergency Use Authorization (EUA). This EUA will remain  in effect (meaning this test can be used) for the duration of the COVID-19 declaration under Section 564(b)(1) of the Act, 21 U.S.C.section 360bbb-3(b)(1), unless the authorization is terminated  or revoked sooner.       Influenza A by PCR NEGATIVE NEGATIVE Final   Influenza B by PCR NEGATIVE NEGATIVE Final    Comment: (NOTE) The Xpert Xpress SARS-CoV-2/FLU/RSV plus assay is intended as an aid in the diagnosis of influenza from Nasopharyngeal swab specimens and should not be used as a sole basis for treatment. Nasal washings and aspirates are unacceptable for Xpert Xpress SARS-CoV-2/FLU/RSV testing.  Fact Sheet for Patients: bloggercourse.com  Fact Sheet for Healthcare Providers: seriousbroker.it  This test is not yet approved or cleared by the [place]  FDA and has been authorized for detection and/or diagnosis of SARS-CoV-2 by FDA under an Emergency Use Authorization (EUA). This EUA will remain in effect (meaning this test can be used) for the duration of the COVID-19 declaration under Section 564(b)(1) of the Act, 21 U.S.C. section 360bbb-3(b)(1), unless the authorization is terminated or revoked.     Resp Syncytial Virus by PCR NEGATIVE NEGATIVE Final    Comment: (NOTE) Fact Sheet for Patients: bloggercourse.com  Fact Sheet for Healthcare Providers: seriousbroker.it  This test is not yet approved or cleared by the [place]  FDA and has been authorized for detection and/or diagnosis of SARS-CoV-2 by FDA under an Emergency Use  Authorization (EUA). This EUA will remain in effect (meaning this test can be used) for the duration of the COVID-19 declaration under Section 564(b)(1) of the Act, 21 U.S.C. section 360bbb-3(b)(1), unless the authorization is terminated or revoked.  Performed at [facility], [address] [place], [place] [address]   Blood Culture (routine x 2)     Status: None   Collection Time: 01/24/24  3:20 PM   Specimen: BLOOD  Result Value Ref Range Status   Specimen Description BLOOD BLOOD LEFT HAND  Final   Special Requests   Final    Blood Culture results may not be optimal due to an inadequate volume of blood received in culture bottles   Culture   Final    NO GROWTH 5 DAYS Performed at [facility], [address] [place], [place] [address]    Report Status 01/29/2024 FINAL  Final  Blood Culture (routine x 2)     Status: None   Collection Time: 01/24/24  3:20 PM   Specimen: BLOOD  Result Value Ref Range Status   Specimen Description BLOOD BLOOD RIGHT ARM  Final   Special Requests   Final    Blood Culture results may not be optimal due to an inadequate volume of blood received in culture bottles   Culture   Final    NO GROWTH 5 DAYS Performed at [facility], [address] [place], [place] [address]    Report Status 01/29/2024 FINAL  Final  MRSA Next Gen by PCR, Nasal     Status: Abnormal   Collection Time: 01/27/24  6:02 AM   Specimen: Nasal Mucosa; Nasal Swab  Result Value Ref Range Status   MRSA by PCR Next Gen DETECTED (A) NOT DETECTED Final    Comment: RESULT CALLED TO, READ BACK BY AND VERIFIED WITH: RN JESSICA B [id number] [id number] FCP (NOTE) The GeneXpert MRSA Assay (FDA approved for NASAL specimens only), is  one component of a comprehensive MRSA colonization surveillance program. It is not intended to diagnose MRSA infection nor to guide or monitor treatment for MRSA infections. Test performance is not FDA approved in patients less than 2  years old. Performed at [facility], [address], [place], [place] [address]   Body fluid culture w Gram Stain     Status: None   Collection Time: 01/27/24 10:18 AM   Specimen: Synovium; Body Fluid  Result Value Ref Range Status   Specimen Description SYNOVIAL  Final   Special Requests Normal  Final   Gram Stain   Final    ABUNDANT WBC PRESENT, PREDOMINANTLY PMN NO ORGANISMS SEEN    Culture   Final    RARE STAPHYLOCOCCUS AUREUS CRITICAL RESULT CALLED TO, READ BACK BY AND VERIFIED WITH: RN MARLA GRAYSON [phone] FCP Performed at [facility], [address] [place], [place] [address]    Report Status 01/29/2024 FINAL  Final   Organism ID, Bacteria STAPHYLOCOCCUS AUREUS  Final      Susceptibility   Staphylococcus aureus - MIC*    CIPROFLOXACIN  >=8 RESISTANT Resistant     ERYTHROMYCIN >=8 RESISTANT Resistant     GENTAMICIN <=0.5 SENSITIVE Sensitive     OXACILLIN 0.5 SENSITIVE Sensitive     TETRACYCLINE >=16 RESISTANT Resistant     VANCOMYCIN  <=0.5 SENSITIVE Sensitive     TRIMETH/SULFA <=10 SENSITIVE Sensitive     CLINDAMYCIN >=8 RESISTANT Resistant     RIFAMPIN <=0.5 SENSITIVE Sensitive     Inducible Clindamycin NEGATIVE Sensitive     LINEZOLID  2 SENSITIVE Sensitive     * RARE STAPHYLOCOCCUS AUREUS  Aerobic/Anaerobic Culture w Gram Stain (surgical/deep wound)     Status: None (Preliminary result)   Collection Time: 01/30/24 11:16 AM   Specimen: Soft Tissue, Other  Result Value Ref Range Status   Specimen Description SYNOVIAL  Final   Special Requests RIGHT KNEE SYNOVIAL  Final   Gram Stain   Final    ABUNDANT WBC PRESENT, PREDOMINANTLY PMN NO ORGANISMS SEEN    Culture   Final    NO GROWTH 4 DAYS NO ANAEROBES ISOLATED; CULTURE IN PROGRESS FOR 5 DAYS Performed at [facility], [address] [place], [place] [address]    Report Status PENDING  Incomplete  Aerobic/Anaerobic Culture w Gram Stain (surgical/deep wound)     Status: None (Preliminary  result)   Collection Time: 01/30/24 11:17 AM   Specimen: Soft Tissue, Other  Result Value Ref Range Status   Specimen Description SYNOVIAL  Final   Special Requests RIGHT KNEE SYNOVIUM  Final   Gram Stain   Final    RARE WBC PRESENT, PREDOMINANTLY PMN NO ORGANISMS SEEN    Culture   Final    NO GROWTH 4 DAYS NO ANAEROBES ISOLATED; CULTURE IN PROGRESS FOR 5 DAYS Performed at [facility], [address], [place], [place] [address]    Report Status PENDING  Incomplete     Labs: BNP (last 3 results) No results for input(s): BNP in the last 8760 hours. Basic Metabolic Panel: Recent Labs  Lab 01/28/24 0150 01/29/24 0227 01/30/24 0532 01/31/24 0546 02/01/24 0540 02/02/24 0814  NA 133* 135 138 136 135 136  K 4.0 3.9 4.4 4.7 4.5 4.7  CL 103 103 105 104 102 101  CO2 18* 24 26 23 25 29   GLUCOSE 132* 171* 123* 314* 181* 148*  BUN 23 20 22  26* 30* 28*  CREATININE  1.54* 1.40* 1.42* 1.43* 1.58* 1.45*  CALCIUM  8.6* 8.4* 8.4* 8.4* 8.1* 8.5*  MG 1.9 2.0 2.0 2.1  --   --   PHOS 2.4* 2.2* 3.3  --   --   --    Liver Function Tests: No results for input(s): AST, ALT, ALKPHOS, BILITOT, PROT, ALBUMIN  in the last 168 hours. No results for input(s): LIPASE, AMYLASE in the last 168 hours. No results for input(s): AMMONIA in the last 168 hours. CBC: Recent Labs  Lab 01/28/24 0150 01/29/24 0227 01/30/24 0532 01/31/24 0546 02/01/24 0540 02/02/24 0814  WBC 11.0* 8.8 7.9 10.2 13.5* 11.8*  NEUTROABS 8.8* 6.6 5.6 9.0* 10.9*  --   HGB 8.1* 8.0* 7.6* 9.2* 8.6* 9.3*  HCT 26.1* 25.9* 25.1* 29.1* 27.3* 30.5*  MCV 88.2 88.1 89.0 87.7 88.9 89.4  PLT 244 242 258 276 290 365   Cardiac Enzymes: Recent Labs  Lab 01/30/24 0532  CKTOTAL 27*   BNP: Invalid input(s): POCBNP CBG: Recent Labs  Lab 02/02/24 1206 02/02/24 1613 02/02/24 2120 02/03/24 0751 02/03/24 1114  GLUCAP 151* 107* 159* 146* 97   D-Dimer No results for input(s): DDIMER in the last 72  hours. Hgb A1c No results for input(s): HGBA1C in the last 72 hours. Lipid Profile No results for input(s): CHOL, HDL, LDLCALC, TRIG, CHOLHDL, LDLDIRECT in the last 72 hours. Thyroid function studies No results for input(s): TSH, T4TOTAL, T3FREE, THYROIDAB in the last 72 hours.  Invalid input(s): FREET3 Anemia work up No results for input(s): VITAMINB12, FOLATE, FERRITIN, TIBC, IRON , RETICCTPCT in the last 72 hours. Urinalysis    Component Value Date/Time   COLORURINE YELLOW 12/05/2023 1045   APPEARANCEUR CLEAR 12/05/2023 1045   LABSPEC 1.012 12/05/2023 1045   PHURINE 6.0 12/05/2023 1045   GLUCOSEU NEGATIVE 12/05/2023 1045   HGBUR NEGATIVE 12/05/2023 1045   BILIRUBINUR NEGATIVE 12/05/2023 1045   KETONESUR NEGATIVE 12/05/2023 1045   PROTEINUR NEGATIVE 12/05/2023 1045   NITRITE NEGATIVE 12/05/2023 1045   LEUKOCYTESUR NEGATIVE 12/05/2023 1045   Sepsis Labs Recent Labs  Lab 01/30/24 0532 01/31/24 0546 02/01/24 0540 02/02/24 0814  WBC 7.9 10.2 13.5* 11.8*   Microbiology Recent Results (from the past 240 hours)  Resp panel by RT-PCR (RSV, Flu A&B, Covid) Anterior Nasal Swab     Status: None   Collection Time: 01/24/24  3:08 PM   Specimen: Anterior Nasal Swab  Result Value Ref Range Status   SARS Coronavirus 2 by RT PCR NEGATIVE NEGATIVE Final    Comment: (NOTE) SARS-CoV-2 target nucleic acids are NOT DETECTED.  The SARS-CoV-2 RNA is generally detectable in upper respiratory specimens during the acute phase of infection. The lowest concentration of SARS-CoV-2 viral copies this assay can detect is 138 copies/mL. A negative result does not preclude SARS-Cov-2 infection and should not be used as the sole basis for treatment or other patient management decisions. A negative result may occur with  improper specimen collection/handling, submission of specimen other than nasopharyngeal swab, presence of viral mutation(s) within the areas  targeted by this assay, and inadequate number of viral copies(<138 copies/mL). A negative result must be combined with clinical observations, patient history, and epidemiological information. The expected result is Negative.  Fact Sheet for Patients:  bloggercourse.com  Fact Sheet for Healthcare Providers:  seriousbroker.it  This test is no t yet approved or cleared by the [place]  FDA and  has been authorized for detection and/or diagnosis of SARS-CoV-2 by FDA under an Emergency Use Authorization (EUA). This EUA will remain  in effect (meaning this test can be used) for the duration of the COVID-19 declaration under Section 564(b)(1) of the Act, 21 U.S.C.section 360bbb-3(b)(1), unless the authorization is terminated  or revoked sooner.       Influenza A by PCR NEGATIVE NEGATIVE Final   Influenza B by PCR NEGATIVE NEGATIVE Final    Comment: (NOTE) The Xpert Xpress SARS-CoV-2/FLU/RSV plus assay is intended as an aid in the diagnosis of influenza from Nasopharyngeal swab specimens and should not be used as a sole basis for treatment. Nasal washings and aspirates are unacceptable for Xpert Xpress SARS-CoV-2/FLU/RSV testing.  Fact Sheet for Patients: bloggercourse.com  Fact Sheet for Healthcare Providers: seriousbroker.it  This test is not yet approved or cleared by the [place]  FDA and has been authorized for detection and/or diagnosis of SARS-CoV-2 by FDA under an Emergency Use Authorization (EUA). This EUA will remain in effect (meaning this test can be used) for the duration of the COVID-19 declaration under Section 564(b)(1) of the Act, 21 U.S.C. section 360bbb-3(b)(1), unless the authorization is terminated or revoked.     Resp Syncytial Virus by PCR NEGATIVE NEGATIVE Final    Comment: (NOTE) Fact Sheet for  Patients: bloggercourse.com  Fact Sheet for Healthcare Providers: seriousbroker.it  This test is not yet approved or cleared by the [place]  FDA and has been authorized for detection and/or diagnosis of SARS-CoV-2 by FDA under an Emergency Use Authorization (EUA). This EUA will remain in effect (meaning this test can be used) for the duration of the COVID-19 declaration under Section 564(b)(1) of the Act, 21 U.S.C. section 360bbb-3(b)(1), unless the authorization is terminated or revoked.  Performed at [facility], [address] [place], [place] [address]   Blood Culture (routine x 2)     Status: None   Collection Time: 01/24/24  3:20 PM   Specimen: BLOOD  Result Value Ref Range Status   Specimen Description BLOOD BLOOD LEFT HAND  Final   Special Requests   Final    Blood Culture results may not be optimal due to an inadequate volume of blood received in culture bottles   Culture   Final    NO GROWTH 5 DAYS Performed at [facility], [address], [place], [place] [address]    Report Status 01/29/2024 FINAL  Final  Blood Culture (routine x 2)     Status: None   Collection Time: 01/24/24  3:20 PM   Specimen: BLOOD  Result Value Ref Range Status   Specimen Description BLOOD BLOOD RIGHT ARM  Final   Special Requests   Final    Blood Culture results may not be optimal due to an inadequate volume of blood received in culture bottles   Culture   Final    NO GROWTH 5 DAYS Performed at [facility], [address], [place], [place] [address]    Report Status 01/29/2024 FINAL  Final  MRSA Next Gen by PCR, Nasal     Status: Abnormal   Collection Time: 01/27/24  6:02 AM   Specimen: Nasal Mucosa; Nasal Swab  Result Value Ref Range Status   MRSA by PCR Next Gen DETECTED (A) NOT DETECTED Final    Comment: RESULT CALLED TO, READ BACK BY AND VERIFIED WITH: RN JESSICA B [id number] FCP (NOTE) The  GeneXpert MRSA Assay (FDA approved for NASAL specimens only), is one component of a comprehensive MRSA colonization surveillance program. It is not intended to diagnose MRSA infection nor to guide or monitor treatment  for MRSA infections. Test performance is not FDA approved in patients less than 26 years old. Performed at [facility], [address], [place], [place] [address]   Body fluid culture w Gram Stain     Status: None   Collection Time: 01/27/24 10:18 AM   Specimen: Synovium; Body Fluid  Result Value Ref Range Status   Specimen Description SYNOVIAL  Final   Special Requests Normal  Final   Gram Stain   Final    ABUNDANT WBC PRESENT, PREDOMINANTLY PMN NO ORGANISMS SEEN    Culture   Final    RARE STAPHYLOCOCCUS AUREUS CRITICAL RESULT CALLED TO, READ BACK BY AND VERIFIED WITH: RN MARLA GRAYSON [phone] FCP Performed at [facility], [address], [place], [place] [address]    Report Status 01/29/2024 FINAL  Final   Organism ID, Bacteria STAPHYLOCOCCUS AUREUS  Final      Susceptibility   Staphylococcus aureus - MIC*    CIPROFLOXACIN  >=8 RESISTANT Resistant     ERYTHROMYCIN >=8 RESISTANT Resistant     GENTAMICIN <=0.5 SENSITIVE Sensitive     OXACILLIN 0.5 SENSITIVE Sensitive     TETRACYCLINE >=16 RESISTANT Resistant     VANCOMYCIN  <=0.5 SENSITIVE Sensitive     TRIMETH/SULFA <=10 SENSITIVE Sensitive     CLINDAMYCIN >=8 RESISTANT Resistant     RIFAMPIN <=0.5 SENSITIVE Sensitive     Inducible Clindamycin NEGATIVE Sensitive     LINEZOLID  2 SENSITIVE Sensitive     * RARE STAPHYLOCOCCUS AUREUS  Aerobic/Anaerobic Culture w Gram Stain (surgical/deep wound)     Status: None (Preliminary result)   Collection Time: 01/30/24 11:16 AM   Specimen: Soft Tissue, Other  Result Value Ref Range Status   Specimen Description SYNOVIAL  Final   Special Requests RIGHT KNEE SYNOVIAL  Final   Gram Stain   Final    ABUNDANT WBC PRESENT, PREDOMINANTLY PMN NO ORGANISMS SEEN     Culture   Final    NO GROWTH 4 DAYS NO ANAEROBES ISOLATED; CULTURE IN PROGRESS FOR 5 DAYS Performed at [facility], [address], [place], [place] [address]    Report Status PENDING  Incomplete  Aerobic/Anaerobic Culture w Gram Stain (surgical/deep wound)     Status: None (Preliminary result)   Collection Time: 01/30/24 11:17 AM   Specimen: Soft Tissue, Other  Result Value Ref Range Status   Specimen Description SYNOVIAL  Final   Special Requests RIGHT KNEE SYNOVIUM  Final   Gram Stain   Final    RARE WBC PRESENT, PREDOMINANTLY PMN NO ORGANISMS SEEN    Culture   Final    NO GROWTH 4 DAYS NO ANAEROBES ISOLATED; CULTURE IN PROGRESS FOR 5 DAYS Performed at [facility], [address] [place], [place] [address]    Report Status PENDING  Incomplete     Time coordinating discharge: Over 30 minutes  SIGNED:   Brayton Lye, MD  [facility] 02/03/2024, 1:20 PM Pager   If 7PM-7AM, please contact night-coverage [email or web address]     [1]  Allergies Allergen Reactions   Losartan Other (See Comments)    Hyperkalemia (>5.5 multiple times on losartan 25mg )   Chicken Allergy Nausea And Vomiting   Poultry Meal Nausea And Vomiting

## 2024-02-03 NOTE — TOC Transition Note (Signed)
 Transition of Care [facility]) - Discharge Note   Patient Details  Name: Joe Coleman MRN: [id number] Date of Birth: 24-Feb-1945  [facility]) CM/SW Contact:  Inocente GORMAN Kindle, LCSW Phone Number: 02/03/2024, 3:00 PM   Clinical Narrative:    Patient will DC to: [facility] Anticipated DC date: 02/03/24 Family notified: Niece Transport by: [facility]   Per MD patient ready for DC to [place]. RN to call report prior to discharge [phone] room 105B). RN, patient, patient's family, and facility notified of DC. Discharge Summary and FL2 sent to facility. DC packet on chart including signed scripts. Ambulance transport requested for patient.   CSW will sign off for now as social work intervention is no longer needed. Please consult us  again if new needs arise.     Final next level of care: Skilled Nursing Facility Barriers to Discharge: Barriers Resolved   Patient Goals and CMS Choice Patient states their goals for this hospitalization and ongoing recovery are:: Rehab CMS Medicare.gov Compare Post Acute Care list provided to:: Patient Choice offered to / list presented to : Patient [place] ownership interest in [facility].provided to:: Patient    Discharge Placement   Existing PASRR number confirmed : 02/03/24          Patient chooses bed at: [facility] Patient to be transferred to facility by: [facility] Name of family member notified: Pt notified family Patient and family notified of of transfer: 02/03/24  Discharge Plan and Services Additional resources added to the After Visit Summary for   In-house Referral: Clinical Social Work   Post Acute Care Choice: Skilled Nursing Facility                               Social Drivers of Health (SDOH) Interventions SDOH Screenings   Food Insecurity: No Food Insecurity (01/26/2024)  Housing: Low Risk (01/26/2024)  Transportation Needs: No Transportation Needs (01/26/2024)  Utilities: Not  At Risk (01/26/2024)  Financial Resource Strain: Medium Risk (05/02/2021)   Received from [facility]  Social Connections: Unknown (01/26/2024)  Tobacco Use: Medium Risk (01/30/2024)  Health Literacy: High Risk (05/04/2022)   Received from [facility]     Readmission Risk Interventions    02/02/2024   12:42 PM 09/01/2023   12:00 PM  Readmission Risk Prevention Plan  Transportation Screening Complete Complete  Medication Review Oceanographer) Complete Complete  PCP or Specialist appointment within 3-5 days of discharge Complete Complete  HRI or Home Care Consult Complete Complete  SW Recovery Care/Counseling Consult Complete Complete  Palliative Care Screening Not Applicable Not Applicable  Skilled Nursing Facility Complete Complete

## 2024-02-04 LAB — AEROBIC/ANAEROBIC CULTURE W GRAM STAIN (SURGICAL/DEEP WOUND)
Culture: NO GROWTH
Culture: NO GROWTH

## 2024-02-23 ENCOUNTER — Telehealth: Payer: Self-pay

## 2024-02-23 NOTE — Telephone Encounter (Signed)
 Called [facility] and spoke with Lucie, nurse unit manager, to request weekly labs be faxed prior to patient's appointment tomorrow. She states no OPAT labs have been drawn since patient's discharge from the hospital on 12/30.  Relayed that weekly CBC, CMP, CRP, and ESR should be drawn and faxed to our office to monitor him while on IV cefazolin .   They also had no end date on file for patient, relayed that currently EOT is set for 2/6.  Lucie will draw STAT CBC and CMP tonight to have those available for his appointment tomorrow. RN will draw ESR and CRP during appointment tomorrow since facility is unable to obtain those STAT and Karrin will resume weekly draws of all 4 labs next week.   Joe Coleman, BSN, RN

## 2024-02-24 ENCOUNTER — Ambulatory Visit: Admitting: Internal Medicine

## 2024-02-24 ENCOUNTER — Other Ambulatory Visit: Payer: Self-pay

## 2024-02-24 ENCOUNTER — Encounter: Payer: Self-pay | Admitting: Internal Medicine

## 2024-02-24 VITALS — BP 165/81 | HR 94 | Temp 97.6°F

## 2024-02-24 DIAGNOSIS — B961 Klebsiella pneumoniae [K. pneumoniae] as the cause of diseases classified elsewhere: Secondary | ICD-10-CM | POA: Diagnosis not present

## 2024-02-24 DIAGNOSIS — B9689 Other specified bacterial agents as the cause of diseases classified elsewhere: Secondary | ICD-10-CM

## 2024-02-24 DIAGNOSIS — N183 Chronic kidney disease, stage 3 unspecified: Secondary | ICD-10-CM

## 2024-02-24 DIAGNOSIS — Z720 Tobacco use: Secondary | ICD-10-CM | POA: Diagnosis not present

## 2024-02-24 DIAGNOSIS — T847XXD Infection and inflammatory reaction due to other internal orthopedic prosthetic devices, implants and grafts, subsequent encounter: Secondary | ICD-10-CM | POA: Diagnosis not present

## 2024-02-24 NOTE — Progress Notes (Signed)
 "     Patient: Joe Coleman  DOB: 09/25/1945 MRN: [id number] PCP: [facility]    Chief Complaint  Patient presents with   Hospitalization Follow-up     Patient Active Problem List   Diagnosis Date Noted   Protein-calorie malnutrition, severe 01/28/2024   Septic arthritis of knee, right (HCC) 01/26/2024   Aspiration pneumonia (HCC) 12/21/2023   Hyperkalemia 12/21/2023   History of deep vein thrombosis (DVT) of lower extremity 12/21/2023   Acute urinary retention 12/21/2023   Sepsis due to pneumonia (HCC) 12/20/2023   Hardware complicating wound infection 10/29/2023   Uncontrolled type 2 diabetes mellitus with hyperglycemia, without long-term current use of insulin  (HCC) 10/29/2023   Type 2 diabetes mellitus with peripheral neuropathy (HCC) 10/28/2023   GERD without esophagitis 10/28/2023   Chronic obstructive pulmonary disease (COPD) (HCC) 10/28/2023   Cellulitis of right leg 10/28/2023   Rhabdomyolysis 10/28/2023   Pressure injury of skin 10/28/2023   History of DVT (deep vein thrombosis) 10/28/2023   Normocytic anemia 10/28/2023   Acute metabolic encephalopathy 10/27/2023   Acute kidney injury superimposed on chronic kidney disease 10/27/2023   Open knee wound, right, sequela 10/14/2023   Malnutrition of moderate degree 08/30/2023   Tibia/fibula fracture 08/24/2023   Cellulitis of right arm 08/10/2023   Eye pain, bilateral 07/26/2022   Fall 07/26/2022   Lymphopenia 07/26/2022   Shortness of breath 05/21/2022   COVID-19 virus infection 03/17/2020   COPD with acute exacerbation (HCC) 03/17/2020   Type 2 diabetes mellitus (HCC) 03/17/2020   HTN (hypertension) 03/17/2020   Vitamin B12 deficiency 10/05/2019   Erectile dysfunction 11/10/2017   CKD stage 3b, GFR 30-44 ml/min (HCC) 09/19/2017   Mild intermittent asthma without complication 09/19/2017   Chronic cough 10/22/2016   Epigastric pain 06/30/2015   Chronic bilateral low back pain without sciatica  05/31/2015   Neuropathic pain 03/01/2015   Benign prostatic hyperplasia with lower urinary tract symptoms 04/06/2014   Adenomatous colon polyp 08/31/2013   Depression 06/21/2013   Foot pain, bilateral 05/07/2013   Insomnia 03/24/2013   Vitiligo 12/23/2012   Primary localized osteoarthrosis, lower leg 01/15/2011   Pain medication agreement broken 09/19/2010   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  Or cultures grew Klebsiella and Enterobacter cloacae.  Discharged on daptomycin  cefepime  x 6 weeks from OR EOT 10/21.  He was readmitted on 9/24 due to altered mental status and elevated CK.  Antibiotic regimen was changed to linezolid  x 2 weeks then Doxy to complete course as well as ertapenem  till 1021t 11/12/23: notes pt was seen by ortho yesterday. Took stiches out, redressed 11/25/23: doing well. No new complaints 12/10/23: tolerating abx. Reports wound is starting to improve Review of Systems  Today: doing well on abx.   Review of Systems  All other systems reviewed and are negative.   Past Medical History:  Diagnosis Date   Cancer (HCC)    stomach and colon   Diabetes mellitus without complication (HCC)    Type II   Hypertension     Outpatient Medications Prior to Visit  Medication Sig Dispense Refill   acetaminophen  (TYLENOL ) 325 MG tablet Take 2 tablets (650 mg total) by mouth every 6 (six) hours as needed for mild pain (pain score 1-3) or fever (or Fever >/= 101).     albuterol  (PROVENTIL  HFA;VENTOLIN  HFA) 108 (90  Base) MCG/ACT inhaler Inhale 2 puffs into the lungs every 6 (six) hours as needed for wheezing or shortness of breath. 1 Inhaler 2   alum & mag hydroxide-simeth (MAALOX/MYLANTA) 200-200-20 MG/5ML suspension Take 30 mLs by mouth every 4 (four) hours as needed for indigestion.     apixaban  (ELIQUIS ) 5 MG TABS tablet Take 1 tablet (5 mg total) by mouth 2 (two) times daily. 60 tablet 0   carvedilol  (COREG ) 3.125 MG tablet Take 1 tablet (3.125 mg total) by mouth 2 (two) times daily with a meal.     ceFAZolin  (ANCEF ) 2-4 GM/100ML-% IVPB Inject 100 mLs (2 g total) into the vein every 8 (eight) hours.     chlorhexidine  (HIBICLENS ) 4 % external liquid Apply 15 mLs (1 Application total) topically as directed for 30 doses. Use as directed daily for 5 days every other week for 6 weeks. 946 mL 1   cholecalciferol  (CHOLECALCIFEROL ) 25 MCG tablet Take 1 tablet (1,000 Units total) by mouth daily.     cyanocobalamin  (VITAMIN B12) 1000 MCG tablet Take 1 tablet (1,000 mcg total) by mouth daily.     docusate sodium  (COLACE) 100 MG capsule Take 1 capsule (100 mg total) by mouth 2 (two) times daily.     feeding supplement, GLUCERNA SHAKE, (GLUCERNA SHAKE) LIQD Take 237 mLs by mouth 3 (three) times daily between meals.     gabapentin  (NEURONTIN ) 300 MG capsule Take 300 mg by mouth at bedtime.     insulin  aspart (NOVOLOG ) 100 UNIT/ML injection Inject 0-5 Units into the skin at bedtime. insulin  aspart (novoLOG ) injection 0-5 Units  0-5 Units, Subcutaneous, Daily at bedtime, First dose on Mon 01/26/24 at 2200 Correction coverage: HS scale CBG < 70: Implement Hypoglycemia Standing Orders and refer to Hypoglycemia Standing Orders sidebar report CBG 70 - 120: 0 units CBG 121 - 150: 0 units CBG 151 - 200: 0 units CBG 201 - 250: 2 units CBG 251 - 300: 3 units CBG 301 - 350: 4 units CBG 351 - 400: 5 units CBG > 400: call MD and obtain STAT lab verification     insulin  aspart (NOVOLOG ) 100 UNIT/ML injection Inject 0-9 Units into the  skin 3 (three) times daily with meals. 0-9 Units, Subcutaneous, 3 times daily with meals, First dose on Mon 01/26/24 at 1700 Correction coverage: Sensitive (thin, NPO, renal) CBG < 70: Implement Hypoglycemia Standing Orders and refer to Hypoglycemia Standing Orders sidebar report CBG 70 - 120: 0 units CBG 121 - 150: 1 unit CBG 151 - 200: 2 units CBG 201 - 250: 3 units CBG 251 - 300: 5 units CBG 301 - 350: 7 units CBG 351 - 400: 9 units CBG > 400: call MD and obtain STAT lab verification     melatonin 3 MG TABS tablet Take 1 tablet (3 mg total) by mouth at bedtime as needed.     methocarbamol  (ROBAXIN ) 500 MG tablet Take 1 tablet (500 mg total) by mouth every 6 (six) hours as needed for muscle spasms. 30 tablet 0   Multiple Vitamin (MULTIVITAMIN WITH MINERALS) TABS tablet Take 1 tablet by mouth daily.     mupirocin  ointment (BACTROBAN ) 2 % Place 1 Application into the nose 2 (two) times  daily for 60 doses. Use as directed 2 times daily for 5 days every other week for 6 weeks. 60 g 0   Nutritional Supplements (,FEEDING SUPPLEMENT, PROSOURCE PLUS) liquid Take 30 mLs by mouth 2 (two) times daily between meals.     ondansetron  (ZOFRAN ) 4 MG tablet Take 1 tablet (4 mg total) by mouth every 6 (six) hours as needed for nausea. (Patient not taking: Reported on 01/26/2024)     oxyCODONE  (OXY IR/ROXICODONE ) 5 MG immediate release tablet Take 1 tablet (5 mg total) by mouth every 4 (four) hours as needed for severe pain (pain score 7-10). 15 tablet 0   pantoprazole  (PROTONIX ) 20 MG tablet Take 2 tablets (40 mg total) by mouth daily for 14 days. 28 tablet 0   polyethylene glycol (MIRALAX  / GLYCOLAX ) 17 g packet Take 17 g by mouth daily.     tamsulosin  (FLOMAX ) 0.4 MG CAPS capsule Take 1 capsule (0.4 mg total) by mouth daily. 30 capsule 0   TRELEGY ELLIPTA 200-62.5-25 MCG/ACT AEPB Inhale 1 puff into the lungs daily.     Vitamin D , Ergocalciferol , (DRISDOL ) 1.25 MG (50000 UNIT) CAPS capsule Take 1 capsule (50,000 Units  total) by mouth every 7 (seven) days.     No facility-administered medications prior to visit.     Allergies[1]  Social History[2]  Family History  Problem Relation Age of Onset   Heart disease Mother    Heart disease Brother     Objective:   Vitals:   02/24/24 1007  BP: (!) 165/81  Pulse: 94  Temp: 97.6 F (36.4 C)  TempSrc: Temporal  SpO2: 98%   There is no height or weight on file to calculate BMI.  Physical Exam Constitutional:      General: He is not in acute distress.    Appearance: He is normal weight. He is not toxic-appearing.  HENT:     Head: Normocephalic and atraumatic.     Right Ear: External ear normal.     Left Ear: External ear normal.     Nose: No congestion or rhinorrhea.     Mouth/Throat:     Mouth: Mucous membranes are moist.     Pharynx: Oropharynx is clear.  Eyes:     Extraocular Movements: Extraocular movements intact.     Conjunctiva/sclera: Conjunctivae normal.     Pupils: Pupils are equal, round, and reactive to light.  Cardiovascular:     Rate and Rhythm: Normal rate and regular rhythm.     Heart sounds: No murmur heard.    No friction rub. No gallop.  Pulmonary:     Effort: Pulmonary effort is normal.     Breath sounds: Normal breath sounds.  Abdominal:     General: Abdomen is flat. Bowel sounds are normal.     Palpations: Abdomen is soft.  Musculoskeletal:     Cervical back: Normal range of motion and neck supple.  Skin:    General: Skin is warm and dry.  Neurological:     General: No focal deficit present.     Mental Status: He is oriented to person, place, and time.  Psychiatric:        Mood and Affect: Mood normal.          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  Initially discharged on cefepime  and daptomycin .  He was readmitted 9/24 for altered mental status crease CPK.  Transition to ertapenem  and linezolid  plan to complete antibiotic course with doxycycline .. At last op visit wound erythematous po doxy and cipro (added).  Patient was hospitalized 12/22 for worsening drainage from wound.  CT showed right knee arthroplasty expected alignment community Ted[facility] proximal tibial fracture, fibular fracture, moderate large thick-walled joint effusion with synovial enhancement.  WBC 17K on arrival patient afebrile.  Orthopedics was consulted underwent knee aspiration which grew MSSA.  Taken to the OR for I&D on 12/26 cultures with no growth.  Discharged on cefazolin  x 6 weeks from the OR EOT 03/12/2026 #Medication management #PICC line - Order stat CBC CMP as we do not have labs from SNF.  Will add ESR CRP today - Reviewed CBC from 1/19 which showed WBC 9.2, serum creatinine 1.34 Plan: - Need weekly labs - esr and crp today to add on to labs above and weekly labs - Final  abx plan pending clinical progression and weekly labs. -continue to follow with orhto - Follow-up with infectious disease on 03/12/2024.  Loney Stank, MD [facility]   02/24/24  10:15 AM I personally spent a total of 45 minutes in the care of the patient today including preparing to see the patient, getting/reviewing separately obtained history, performing a medically appropriate  exam/evaluation, counseling and educating, placing orders, documenting clinical information in the EHR, independently interpreting results, and communicating results.     [1]  Allergies Allergen Reactions   Losartan Other (See Comments)    Hyperkalemia (>5.5 multiple times on losartan 25mg )   Chicken Allergy Nausea And Vomiting   Poultry Meal Nausea And Vomiting  [2]  Social History Tobacco Use   Smoking status: Former   Smokeless tobacco: Never  Vaping Use   Vaping status: Never Used  Substance Use Topics   Alcohol use: No   Drug use: No   "

## 2024-02-24 NOTE — Patient Instructions (Addendum)
 Please send weekly labs: cbc, cmp, esr, crp F/U on 2/6 Continue to follow up with ortho Continue cefazolin  till ID visit on 03/12/24

## 2024-02-24 NOTE — Progress Notes (Signed)
 Attempted to draw labs from PICC line, no blood return. Line flushed with normal saline and clamped. Patient tolerated well, provider notified.   Lanayah Gartley, BSN, RN

## 2024-02-25 LAB — C-REACTIVE PROTEIN: CRP: 43.3 mg/L — ABNORMAL HIGH

## 2024-02-25 LAB — SEDIMENTATION RATE: Sed Rate: 50 mm/h — ABNORMAL HIGH (ref 0–20)

## 2024-02-25 NOTE — Telephone Encounter (Signed)
 Thanks Megan!

## 2024-03-11 ENCOUNTER — Other Ambulatory Visit: Payer: Self-pay

## 2024-03-11 ENCOUNTER — Emergency Department (HOSPITAL_COMMUNITY)

## 2024-03-11 ENCOUNTER — Emergency Department (HOSPITAL_COMMUNITY)
Admission: EM | Admit: 2024-03-11 | Discharge: 2024-03-11 | Disposition: A | Discharge status: Skilled Nursing Facility | Source: Ambulatory Visit | Attending: Emergency Medicine | Admitting: Emergency Medicine

## 2024-03-11 ENCOUNTER — Telehealth: Payer: Self-pay

## 2024-03-11 ENCOUNTER — Ambulatory Visit: Admitting: Internal Medicine

## 2024-03-11 ENCOUNTER — Encounter (HOSPITAL_COMMUNITY): Payer: Self-pay | Admitting: Emergency Medicine

## 2024-03-11 VITALS — BP 125/71 | HR 95 | Temp 98.4°F

## 2024-03-11 DIAGNOSIS — M7989 Other specified soft tissue disorders: Secondary | ICD-10-CM

## 2024-03-11 DIAGNOSIS — E875 Hyperkalemia: Secondary | ICD-10-CM

## 2024-03-11 DIAGNOSIS — M009 Pyogenic arthritis, unspecified: Secondary | ICD-10-CM

## 2024-03-11 DIAGNOSIS — L03115 Cellulitis of right lower limb: Secondary | ICD-10-CM

## 2024-03-11 LAB — PROTIME-INR
INR: 1.5 — ABNORMAL HIGH (ref 0.8–1.2)
Prothrombin Time: 18.6 s — ABNORMAL HIGH (ref 11.4–15.2)

## 2024-03-11 LAB — CBC WITH DIFFERENTIAL/PLATELET
Abs Immature Granulocytes: 0.13 10*3/uL — ABNORMAL HIGH (ref 0.00–0.07)
Basophils Absolute: 0.1 10*3/uL (ref 0.0–0.1)
Basophils Relative: 0 %
Eosinophils Absolute: 0.2 10*3/uL (ref 0.0–0.5)
Eosinophils Relative: 1 %
HCT: 38.9 % — ABNORMAL LOW (ref 39.0–52.0)
Hemoglobin: 11.3 g/dL — ABNORMAL LOW (ref 13.0–17.0)
Immature Granulocytes: 1 %
Lymphocytes Relative: 6 %
Lymphs Abs: 1 10*3/uL (ref 0.7–4.0)
MCH: 26.3 pg (ref 26.0–34.0)
MCHC: 29 g/dL — ABNORMAL LOW (ref 30.0–36.0)
MCV: 90.5 fL (ref 80.0–100.0)
Monocytes Absolute: 1 10*3/uL (ref 0.1–1.0)
Monocytes Relative: 6 %
Neutro Abs: 14.2 10*3/uL — ABNORMAL HIGH (ref 1.7–7.7)
Neutrophils Relative %: 86 %
Platelets: 281 10*3/uL (ref 150–400)
RBC: 4.3 MIL/uL (ref 4.22–5.81)
RDW: 15.7 % — ABNORMAL HIGH (ref 11.5–15.5)
WBC: 16.6 10*3/uL — ABNORMAL HIGH (ref 4.0–10.5)
nRBC: 0 % (ref 0.0–0.2)

## 2024-03-11 LAB — BASIC METABOLIC PANEL WITH GFR
Anion gap: 11 (ref 5–15)
BUN: 30 mg/dL — ABNORMAL HIGH (ref 8–23)
CO2: 23 mmol/L (ref 22–32)
Calcium: 9.6 mg/dL (ref 8.9–10.3)
Chloride: 101 mmol/L (ref 98–111)
Creatinine, Ser: 1.61 mg/dL — ABNORMAL HIGH (ref 0.61–1.24)
GFR, Estimated: 43 mL/min — ABNORMAL LOW
Glucose, Bld: 225 mg/dL — ABNORMAL HIGH (ref 70–99)
Potassium: 5.5 mmol/L — ABNORMAL HIGH (ref 3.5–5.1)
Sodium: 135 mmol/L (ref 135–145)

## 2024-03-11 LAB — C-REACTIVE PROTEIN: CRP: 11.2 mg/dL — ABNORMAL HIGH

## 2024-03-11 MED ORDER — OXYCODONE HCL 5 MG PO TABS: 5.0000 mg | ORAL_TABLET | ORAL | 0 refills | Status: DC | PRN

## 2024-03-11 MED ORDER — SODIUM ZIRCONIUM CYCLOSILICATE 10 G PO PACK
10.0000 g | PACK | Freq: Once | ORAL | Status: AC
  Administered 2024-03-11: 10 g via ORAL
  Filled 2024-03-11: qty 1

## 2024-03-11 MED ORDER — DOXYCYCLINE HYCLATE 100 MG PO CAPS: 100.0000 mg | ORAL_CAPSULE | Freq: Two times a day (BID) | ORAL | 0 refills | Status: AC

## 2024-03-11 MED ORDER — DOXYCYCLINE HYCLATE 100 MG PO CAPS: 100.0000 mg | ORAL_CAPSULE | Freq: Two times a day (BID) | ORAL | 0 refills | Status: DC

## 2024-03-11 MED ORDER — ACETAMINOPHEN 500 MG PO TABS
1000.0000 mg | ORAL_TABLET | Freq: Once | ORAL | Status: AC
  Administered 2024-03-11: 1000 mg via ORAL
  Filled 2024-03-11: qty 2

## 2024-03-11 MED ORDER — OXYCODONE HCL 5 MG PO TABS: 5.0000 mg | ORAL_TABLET | ORAL | 0 refills | Status: AC | PRN

## 2024-03-11 NOTE — Discharge Instructions (Addendum)
 You have been evaluated for your symptoms.  Fortunately no evidence of blood clot involving your right leg.  Your swelling redness and pain is due to a skin infection called cellulitis.   please take antibiotic as prescribed, take pain medication as needed.  Your potassium level was high today.  You are given medication to help reduce your potassium but it is important for you to follow-up with your primary care doctor for a recheck of your potassium.  You should also call and follow-up closely with your orthopedic specialist for reevaluation.  Return if you have any concern.

## 2024-03-11 NOTE — Telephone Encounter (Signed)
 Called [facility] to request labs, no answer left vm on nurse unit manger line to contact our office.

## 2024-03-11 NOTE — ED Triage Notes (Signed)
 Patient presents from his MD office due to concerns for DVT. He has a history of DVT in his left leg and now complains of pain in his right calf. Patient reports pain has been for 2 days. Patient is on Eliquis . MD requests blood draw for labs.    HX: right knee replacement (currently in rehab), fall on right r knee (last year)    EMS vitals: 126/72 BP 89 HR 96% SPO2 on room air

## 2024-03-11 NOTE — Progress Notes (Unsigned)
 "     Patient: Joe Coleman  DOB: Sep 02, 1945 MRN: [id number] PCP: [facility]  Referring Provider: ***  No chief complaint on file.    Patient Active Problem List   Diagnosis Date Noted   Protein-calorie malnutrition, severe 01/28/2024   Septic arthritis of knee, right (HCC) 01/26/2024   Aspiration pneumonia (HCC) 12/21/2023   Hyperkalemia 12/21/2023   History of deep vein thrombosis (DVT) of lower extremity 12/21/2023   Acute urinary retention 12/21/2023   Sepsis due to pneumonia (HCC) 12/20/2023   Hardware complicating wound infection 10/29/2023   Uncontrolled type 2 diabetes mellitus with hyperglycemia, without long-term current use of insulin  (HCC) 10/29/2023   Type 2 diabetes mellitus with peripheral neuropathy (HCC) 10/28/2023   GERD without esophagitis 10/28/2023   Chronic obstructive pulmonary disease (COPD) (HCC) 10/28/2023   Cellulitis of right leg 10/28/2023   Rhabdomyolysis 10/28/2023   Pressure injury of skin 10/28/2023   History of DVT (deep vein thrombosis) 10/28/2023   Normocytic anemia 10/28/2023   Acute metabolic encephalopathy 10/27/2023   Acute kidney injury superimposed on chronic kidney disease 10/27/2023   Open knee wound, right, sequela 10/14/2023   Malnutrition of moderate degree 08/30/2023   Tibia/fibula fracture 08/24/2023   Cellulitis of right arm 08/10/2023   Eye pain, bilateral 07/26/2022   Fall 07/26/2022   Lymphopenia 07/26/2022   Shortness of breath 05/21/2022   COVID-19 virus infection 03/17/2020   COPD with acute exacerbation (HCC) 03/17/2020   Type 2 diabetes mellitus (HCC) 03/17/2020   HTN (hypertension) 03/17/2020   Vitamin B12 deficiency 10/05/2019   Erectile dysfunction 11/10/2017   CKD stage 3b, GFR 30-44 ml/min (HCC) 09/19/2017   Mild intermittent asthma without complication 09/19/2017   Chronic cough 10/22/2016   Epigastric pain 06/30/2015   Chronic bilateral low back pain without sciatica 05/31/2015    Neuropathic pain 03/01/2015   Benign prostatic hyperplasia with lower urinary tract symptoms 04/06/2014   Adenomatous colon polyp 08/31/2013   Depression 06/21/2013   Foot pain, bilateral 05/07/2013   Insomnia 03/24/2013   Vitiligo 12/23/2012   Primary localized osteoarthrosis, lower leg 01/15/2011   Pain medication agreement broken 09/19/2010   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  Or cultures grew Klebsiella and Enterobacter cloacae.  Discharged on daptomycin  cefepime  x 6 weeks from OR EOT 10/21.  He was readmitted on 9/24 due to altered mental status and elevated CK.  Antibiotic regimen was changed to linezolid  x 2 weeks then Doxy to complete course as well as ertapenem  till 1021t 11/12/23: notes pt was seen by ortho yesterday. Took stiches out, redressed 11/25/23: doing well. No new complaints 12/10/23: tolerating abx. Reports wound is starting to improve 03/11/24: no labs sent. Pt staes he feels weak for the last two daus  ROS  Past Medical History:  Diagnosis Date   Cancer (HCC)    stomach and colon   Diabetes mellitus without complication (HCC)    Type II   Hypertension     Outpatient Medications Prior to Visit  Medication Sig Dispense Refill   acetaminophen  (TYLENOL ) 325 MG  tablet Take 2 tablets (650 mg total) by mouth every 6 (six) hours as needed for mild pain (pain score 1-3) or fever (or Fever >/= 101).     albuterol  (PROVENTIL  HFA;VENTOLIN  HFA) 108 (90 Base) MCG/ACT inhaler Inhale 2 puffs into the lungs  every 6 (six) hours as needed for wheezing or shortness of breath. 1 Inhaler 2   alum & mag hydroxide-simeth (MAALOX/MYLANTA) 200-200-20 MG/5ML suspension Take 30 mLs by mouth every 4 (four) hours as needed for indigestion.     apixaban  (ELIQUIS ) 5 MG TABS tablet Take 1 tablet (5 mg total) by mouth 2 (two) times daily. 60 tablet 0   carvedilol  (COREG ) 3.125 MG tablet Take 1 tablet (3.125 mg total) by mouth 2 (two) times daily with a meal.     ceFAZolin  (ANCEF ) 2-4 GM/100ML-% IVPB Inject 100 mLs (2 g total) into the vein every 8 (eight) hours.     chlorhexidine  (HIBICLENS ) 4 % external liquid Apply 15 mLs (1 Application total) topically as directed for 30 doses. Use as directed daily for 5 days every other week for 6 weeks. 946 mL 1   cholecalciferol  (CHOLECALCIFEROL ) 25 MCG tablet Take 1 tablet (1,000 Units total) by mouth daily.     cyanocobalamin  (VITAMIN B12) 1000 MCG tablet Take 1 tablet (1,000 mcg total) by mouth daily.     docusate sodium  (COLACE) 100 MG capsule Take 1 capsule (100 mg total) by mouth 2 (two) times daily.     feeding supplement, GLUCERNA SHAKE, (GLUCERNA SHAKE) LIQD Take 237 mLs by mouth 3 (three) times daily between meals. (Patient not taking: Reported on 02/24/2024)     gabapentin  (NEURONTIN ) 300 MG capsule Take 300 mg by mouth at bedtime.     insulin  aspart (NOVOLOG ) 100 UNIT/ML injection Inject 0-5 Units into the skin at bedtime. insulin  aspart (novoLOG ) injection 0-5 Units  0-5 Units, Subcutaneous, Daily at bedtime, First dose on Mon 01/26/24 at 2200 Correction coverage: HS scale CBG < 70: Implement Hypoglycemia Standing Orders and refer to Hypoglycemia Standing Orders sidebar report CBG 70 - 120: 0 units CBG 121 - 150: 0 units CBG 151 - 200: 0 units CBG 201 - 250: 2 units CBG 251 - 300: 3 units CBG 301 - 350: 4 units CBG 351 - 400: 5 units CBG > 400: call MD and obtain STAT lab verification     insulin  aspart (NOVOLOG ) 100 UNIT/ML injection Inject 0-9 Units into the skin 3  (three) times daily with meals. 0-9 Units, Subcutaneous, 3 times daily with meals, First dose on Mon 01/26/24 at 1700 Correction coverage: Sensitive (thin, NPO, renal) CBG < 70: Implement Hypoglycemia Standing Orders and refer to Hypoglycemia Standing Orders sidebar report CBG 70 - 120: 0 units CBG 121 - 150: 1 unit CBG 151 - 200: 2 units CBG 201 - 250: 3 units CBG 251 - 300: 5 units CBG 301 - 350: 7 units CBG 351 - 400: 9 units CBG > 400: call MD and obtain STAT lab verification     melatonin 3 MG TABS tablet Take 1 tablet (3 mg total) by mouth at bedtime as needed.     methocarbamol  (ROBAXIN ) 500 MG tablet Take 1 tablet (500 mg total) by mouth every 6 (six) hours as needed for muscle spasms. 30 tablet 0   Multiple Vitamin (MULTIVITAMIN WITH MINERALS) TABS tablet Take 1 tablet by mouth daily.     Nutritional Supplements (,FEEDING SUPPLEMENT, PROSOURCE PLUS) liquid Take 30 mLs by mouth 2 (two) times daily between meals.  ondansetron  (ZOFRAN ) 4 MG tablet Take 1 tablet (4 mg total) by mouth every 6 (six) hours as needed for nausea.     oxyCODONE  (OXY IR/ROXICODONE ) 5 MG immediate release tablet Take 1 tablet (5 mg total) by mouth every 4 (four) hours as needed for severe pain (pain score 7-10). 15 tablet 0   pantoprazole  (PROTONIX ) 20 MG tablet Take 2 tablets (40 mg total) by mouth daily for 14 days. (Patient not taking: Reported on 02/24/2024) 28 tablet 0   polyethylene glycol (MIRALAX  / GLYCOLAX ) 17 g packet Take 17 g by mouth daily.     tamsulosin  (FLOMAX ) 0.4 MG CAPS capsule Take 1 capsule (0.4 mg total) by mouth daily. 30 capsule 0   TRELEGY ELLIPTA 200-62.5-25 MCG/ACT AEPB Inhale 1 puff into the lungs daily.     Vitamin D , Ergocalciferol , (DRISDOL ) 1.25 MG (50000 UNIT) CAPS capsule Take 1 capsule (50,000 Units total) by mouth every 7 (seven) days.     No facility-administered medications prior to visit.     Allergies[1]  Social History[2]  Family History  Problem Relation Age of Onset    Heart disease Mother    Heart disease Brother     Objective:  There were no vitals filed for this visit. There is no height or weight on file to calculate BMI.  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  Initially discharged on cefepime  and daptomycin .  He was readmitted 9/24 for altered mental status crease CPK.  Transition to ertapenem  and linezolid  plan to complete antibiotic course with doxycycline .. At last op visit wound erythematous po doxy and cipro (added).  Patient was hospitalized 12/22 for worsening drainage from wound.  CT showed right knee arthroplasty expected alignment community Ted[facility] proximal tibial fracture, fibular fracture, moderate large thick-walled joint effusion with synovial enhancement.  WBC 17K on arrival patient afebrile.  Orthopedics was consulted underwent knee aspiration which grew MSSA.  Taken to the OR for I&D on 12/26 cultures with no growth.  Discharged on cefazolin  x 6 weeks from the OR EOT 03/12/2026 #Medication management #PICC line SNF did not send labs since last visit -ESR 50,, crp 43.3 on 02/24/24 Plan: - Sending pt to ED for US  to look for RLE DVT, pt states calf pain x 2 days. Will request cbc, cmp, esr and crp( no weekly labs  received) PICC line management pending work up above -F/U in one month Loney Stank, MD [facility]   03/11/24  10:21 AM      [1]  Allergies Allergen Reactions   Losartan Other (See Comments)    Hyperkalemia (>5.5 multiple times on losartan  25mg )   Chicken Allergy Nausea And Vomiting   Poultry Meal Nausea And Vomiting  [2]  Social History Tobacco Use   Smoking status: Former   Smokeless tobacco: Never  Vaping Use   Vaping status: Never Used  Substance Use Topics   Alcohol use: No   Drug use: No   "

## 2024-03-11 NOTE — Patient Instructions (Addendum)
-   Sending pt to ED for US  to look for RLE DVT, pt states calf pain x 2 days. Will request cbc, cmp, esr and crp, blood Cx ( no weekly labs received) PICC line management pending work up above

## 2024-03-11 NOTE — Telephone Encounter (Addendum)
 Per Dr. Dennise was needs to go to ED to rule out DVT of right lower leg. Called EMS @ 10:50 am, Non-emergent EMS arrived at 11:00 am and patient left facility at 11:04. [facility], spoke with Barnie and driver Jerel to inform them that patient is being sent to ED and Jerel came and picked up facility wheelchair and paperwork. Cecelia CMA spoke with patients niece [name] to let her know he was at the ED.

## 2024-03-11 NOTE — ED Notes (Signed)
 Pt given food tray.

## 2024-03-11 NOTE — ED Provider Notes (Cosign Needed)
 " [place] EMERGENCY DEPARTMENT AT [facility] Provider Note   CSN: [id number] Arrival date & time: 03/11/24  1126     Patient presents with: Leg Pain   Joe Coleman is a 79 y.o. male.   The history is provided by the patient and medical records. No language interpreter was used.  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  Patient report for the past several days he noticed increasing pain and swelling to the right calf of his leg.  He called his doctor and was recommended to come to the ER for further evaluation.  He mention he had prior injury to his right knee requiring surgical intervention in November of last year.  Orthopedist is [name].  He has had regular wound dressing change and denies any worsening pain about the knee but primarily pain to his right calf.  No worsening shortness of breath fever productive cough or hemoptysis.  No new injury.  He mention he is compliant with his Eliquis .  His previous DVT was in his left leg.  Prior to Admission medications  Medication Sig Start Date End Date Taking? Authorizing Provider  acetaminophen  (TYLENOL ) 325 MG tablet Take 2 tablets (650 mg total) by mouth every 6 (six) hours as needed for mild pain (pain score 1-3) or fever (or Fever >/= 101). 10/29/23   Josette Ade, MD  albuterol  (PROVENTIL  HFA;VENTOLIN  HFA) 108 (90 Base) MCG/ACT inhaler Inhale 2 puffs into the lungs every 6 (six) hours as needed for wheezing or shortness of breath. 04/01/16   Lang Dover, MD  alum & mag hydroxide-simeth (MAALOX/MYLANTA) 200-200-20 MG/5ML suspension Take 30 mLs by mouth every 4 (four) hours as needed for indigestion. 02/03/24   Elgergawy, Brayton RAMAN, MD  apixaban  (ELIQUIS ) 5 MG TABS tablet Take 1 tablet (5 mg total) by mouth 2 (two) times daily. 12/22/23   Laurita Pillion, MD  carvedilol  (COREG ) 3.125 MG  tablet Take 1 tablet (3.125 mg total) by mouth 2 (two) times daily with a meal. 02/03/24   Elgergawy, Brayton RAMAN, MD  ceFAZolin  (ANCEF ) 2-4 GM/100ML-% IVPB Inject 100 mLs (2 g total) into the vein every 8 (eight) hours. 01/31/24 03/13/24  Elgergawy, Brayton RAMAN, MD  chlorhexidine  (HIBICLENS ) 4 % external liquid Apply 15 mLs (1 Application total) topically as directed for 30 doses. Use as directed daily for 5 days every other week for 6 weeks. 01/30/24   Danton Lauraine LABOR, PA-C  cholecalciferol  (CHOLECALCIFEROL ) 25 MCG tablet Take 1 tablet (1,000 Units total) by mouth daily. 08/28/23   Danton Lauraine LABOR, PA-C  cyanocobalamin  (VITAMIN B12) 1000 MCG tablet Take 1 tablet (1,000 mcg total) by mouth daily. 02/03/24   Elgergawy, Brayton RAMAN, MD  docusate sodium  (COLACE) 100 MG capsule Take 1 capsule (100 mg total) by mouth 2 (two) times daily. 02/03/24   Elgergawy, Brayton RAMAN, MD  feeding supplement, GLUCERNA SHAKE, (GLUCERNA SHAKE) LIQD Take 237 mLs by mouth 3 (three) times daily between meals. Patient not taking: Reported on 03/11/2024 02/03/24   Elgergawy, Brayton RAMAN, MD  gabapentin  (NEURONTIN ) 300 MG capsule Take 300 mg by mouth at bedtime.    [provider]  insulin  aspart (NOVOLOG ) 100 UNIT/ML injection Inject 0-5 Units into the skin at bedtime. insulin  aspart (novoLOG ) injection 0-5 Units  0-5 Units, Subcutaneous, Daily at bedtime, First dose on Mon 01/26/24 at 2200  Correction coverage: HS scale CBG < 70: Implement Hypoglycemia Standing Orders and refer to Hypoglycemia Standing Orders sidebar report CBG 70 - 120: 0 units CBG 121 - 150: 0 units CBG 151 - 200: 0 units CBG 201 - 250: 2 units CBG 251 - 300: 3 units CBG 301 - 350: 4 units CBG 351 - 400: 5 units CBG > 400: call MD and obtain STAT lab verification 02/03/24   Elgergawy, Brayton RAMAN, MD  insulin  aspart (NOVOLOG ) 100 UNIT/ML injection Inject 0-9 Units into the skin 3 (three) times daily with meals. 0-9 Units, Subcutaneous, 3 times daily with meals, First dose  on Mon 01/26/24 at 1700 Correction coverage: Sensitive (thin, NPO, renal) CBG < 70: Implement Hypoglycemia Standing Orders and refer to Hypoglycemia Standing Orders sidebar report CBG 70 - 120: 0 units CBG 121 - 150: 1 unit CBG 151 - 200: 2 units CBG 201 - 250: 3 units CBG 251 - 300: 5 units CBG 301 - 350: 7 units CBG 351 - 400: 9 units CBG > 400: call MD and obtain STAT lab verification 02/03/24   Elgergawy, Brayton RAMAN, MD  melatonin 3 MG TABS tablet Take 1 tablet (3 mg total) by mouth at bedtime as needed. 02/03/24   Elgergawy, Brayton RAMAN, MD  methocarbamol  (ROBAXIN ) 500 MG tablet Take 1 tablet (500 mg total) by mouth every 6 (six) hours as needed for muscle spasms. 10/24/23   Patel, Pranav M, MD  Multiple Vitamin (MULTIVITAMIN WITH MINERALS) TABS tablet Take 1 tablet by mouth daily. 02/04/24   Elgergawy, Brayton RAMAN, MD  Nutritional Supplements (,FEEDING SUPPLEMENT, PROSOURCE PLUS) liquid Take 30 mLs by mouth 2 (two) times daily between meals. 02/03/24   Elgergawy, Brayton RAMAN, MD  ondansetron  (ZOFRAN ) 4 MG tablet Take 1 tablet (4 mg total) by mouth every 6 (six) hours as needed for nausea. 10/29/23   Josette Ade, MD  oxyCODONE  (OXY IR/ROXICODONE ) 5 MG immediate release tablet Take 1 tablet (5 mg total) by mouth every 4 (four) hours as needed for severe pain (pain score 7-10). 02/03/24   Elgergawy, Brayton RAMAN, MD  pantoprazole  (PROTONIX ) 20 MG tablet Take 2 tablets (40 mg total) by mouth daily for 14 days. Patient not taking: Reported on 03/11/2024 12/05/23 01/26/24  Rogelia Jerilynn RAMAN, MD  polyethylene glycol (MIRALAX  / GLYCOLAX ) 17 g packet Take 17 g by mouth daily. 09/01/23   Sherrill Cable Latif, DO  tamsulosin  (FLOMAX ) 0.4 MG CAPS capsule Take 1 capsule (0.4 mg total) by mouth daily. 12/23/23   Laurita Pillion, MD  TRELEGY RUFINO 200-62.5-25 MCG/ACT AEPB Inhale 1 puff into the lungs daily. 07/17/23   [provider]  Vitamin D , Ergocalciferol , (DRISDOL ) 1.25 MG (50000 UNIT) CAPS capsule Take 1 capsule  (50,000 Units total) by mouth every 7 (seven) days. 02/04/24   Elgergawy, Brayton RAMAN, MD    Allergies: Losartan, Chicken allergy, and Poultry meal    Review of Systems  All other systems reviewed and are negative.   Updated Vital Signs BP 123/77 (BP Location: Left Arm)   Pulse 90   Temp 98.2 F (36.8 C) (Oral)   Resp 18   SpO2 99%   Physical Exam Constitutional:      General: He is not in acute distress.    Appearance: He is well-developed.  HENT:     Head: Atraumatic.  Eyes:     Conjunctiva/sclera: Conjunctivae normal.  Cardiovascular:     Rate and Rhythm: Normal rate and regular rhythm.     Pulses: Normal  pulses.     Heart sounds: Normal heart sounds.  Pulmonary:     Effort: Pulmonary effort is normal.     Breath sounds: Normal breath sounds. No wheezing or rales.  Abdominal:     Palpations: Abdomen is soft.  Musculoskeletal:        General: Swelling (Right lower extremity: Right knee is wrapped in Kerlix, removed and the wrapped showing well-appearing surgical scar.  Patient does have erythema to the inferior aspect of the knee extending towards the mid tib-fib and 2+ edema to the lower leg w calf TTP) present.     Cervical back: Normal range of motion and neck supple.  Skin:    Findings: No rash.  Neurological:     Mental Status: He is alert and oriented to person, place, and time.     (all labs ordered are listed, but only abnormal results are displayed) Labs Reviewed  BASIC METABOLIC PANEL WITH GFR - Abnormal; Notable for the following components:      Result Value   Potassium 5.5 (*)    Glucose, Bld 225 (*)    BUN 30 (*)    Creatinine, Ser 1.61 (*)    GFR, Estimated 43 (*)    All other components within normal limits  CBC WITH DIFFERENTIAL/PLATELET - Abnormal; Notable for the following components:   WBC 16.6 (*)    Hemoglobin 11.3 (*)    HCT 38.9 (*)    MCHC 29.0 (*)    RDW 15.7 (*)    Neutro Abs 14.2 (*)    Abs Immature Granulocytes 0.13 (*)    All  other components within normal limits  PROTIME-INR - Abnormal; Notable for the following components:   Prothrombin Time 18.6 (*)    INR 1.5 (*)    All other components within normal limits  CULTURE, BLOOD (SINGLE)  C-REACTIVE PROTEIN  SEDIMENTATION RATE    EKG: None  Radiology: DG Knee Complete 4 Views Right Result Date: 03/11/2024 CLINICAL DATA:  Right calf pain for 2 days. EXAM: RIGHT KNEE - COMPLETE 4+ VIEW COMPARISON:  January 24, 2024 FINDINGS: Status post right total knee arthroplasty. No definite effusion is noted. Vascular calcifications are noted. Proximal right tibial and fibular fractures are again noted which appear to be subacute to chronic. No definite acute fracture is noted currently. IMPRESSION: Status post right total knee arthroplasty. Proximal right tibial and fibular fractures are again noted which appear to be subacute to chronic. No definite acute fracture is noted currently. Electronically Signed   By: Lynwood Landy Raddle M.D.   On: 03/11/2024 13:34     Procedures   Medications Ordered in the ED  acetaminophen  (TYLENOL ) tablet 1,000 mg (1,000 mg Oral Given 03/11/24 1438)  sodium zirconium cyclosilicate  (LOKELMA ) packet 10 g (10 g Oral Given 03/11/24 1444)                                    Medical Decision Making Amount and/or Complexity of Data Reviewed Labs: ordered. Radiology: ordered.  Risk OTC drugs. Prescription drug management.   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  Patient report for the  past several days he noticed increasing pain and swelling to the right calf of his leg.  He called his doctor and was recommended to come to the ER for further evaluation.  He mention he had prior injury to his right knee requiring  surgical intervention in November of last year.  Orthopedist is [name].  He has had regular wound dressing change and denies any worsening pain about the knee but primarily pain to his right calf.  No worsening shortness of breath fever productive cough or hemoptysis.  No new injury.  He mention he is compliant with his Eliquis .  His previous DVT was in his left leg.  On exam patient is resting comfortably in bed appears to be in no acute discomfort.  He has his right knee wrapped in Kerlix, I remove the wrap send patient has a well-appearing midline surgical scar but he has a deeper scar on both lateral and inferior to the knee with scab in place.  There are some surrounding erythema to the lower knee extending towards the anterior tib-fib with 2+ pitting mid to right lower extremity and tenderness to his calf.  He is neurovascular intact with intact pedal pulses.  Left leg with normal appearance.  Note from PCP requesting for labs including CBC, CMP, sed rate, CRP, blood culture, and possibly a PICC line.  PCP also requesting for DVT study of the right lower extremity.  -Labs ordered, independently viewed and interpreted by me.  Labs remarkable for elevated potassium of 5.5, a dose of Lokelma  was given.  Mild AKI with BUN 30, creatinine 1.61, IV fluid given.  White count is elevated at 16.6 -The patient was maintained on a cardiac monitor.  I personally viewed and interpreted the cardiac monitored which showed an underlying rhythm of: Sinus rhythm -Imaging independently viewed and interpreted by me and I agree with radiologist's interpretation.  Result remarkable for right knee x-ray shows status post right total knee arthropathy with proximal right tibial and fibular fracture again noted which appears to be subacute to chronic without any definitive acute fracture noted. -This patient presents to the ED for concern of calf tenderness, this involves an extensive number of treatment options, and is a  complaint that carries with it a high risk of complications and morbidity.  The differential diagnosis includes DVT, cellulitis, peripheral neuropathy, abscess -Co morbidities that complicate the patient evaluation includes cancer, CKD, COPD, diabetes, hypertension, DVT -Treatment includes IV fluid, Lokelma  -Reevaluation of the patient after these medicines showed that the patient improved -PCP office notes or outside notes reviewed -Discussion with attending Dr. Mannie -Escalation to admission/observation considered: patients feels much better, is comfortable with discharge, and will follow up with orthopedist.  Also recommend to have his K+ recheck by PCP  -Prescription medication considered, patient comfortable with doxycycline  and tylenol  -Social Determinant of Health considered which includes health literacy      Final diagnoses:  Cellulitis of right lower leg  Hyperkalemia    ED Discharge Orders          Ordered    doxycycline  (VIBRAMYCIN ) 100 MG capsule  2 times daily        03/11/24 1717    oxyCODONE  (OXY IR/ROXICODONE ) 5 MG immediate release tablet  Every 4 hours PRN        03/11/24 1717               Nivia Colon, PA-C 03/11/24 1720  "

## 2024-03-11 NOTE — Progress Notes (Signed)
 VASCULAR LAB    Right lower extremity venous duplex has been performed.  See CV proc for preliminary results.  Gave verbal report to Lonni Camp, PA-C  Topaz Raglin, RVT 03/11/2024, 2:24 PM

## 2024-03-12 ENCOUNTER — Telehealth: Payer: Self-pay

## 2024-03-12 LAB — CULTURE, BLOOD (SINGLE): Culture: NO GROWTH

## 2024-03-12 NOTE — Telephone Encounter (Signed)
 Samantha RN with [facility] called office to confirm if picc line could come out today or if any changes made due to ED visit on 2/5. Per Dr. Dennise picc can be pulled today and would need to start oral Cefadroxil 1 gm BID x2 weeks. Will need to be seen before oral antibiotics are finished.  RN was able to take verbal orders, repeated and confirmed cefadroxil order during call. Is scheduled to see Dr. Dennise on 2/19 at 915. RN will also fax last set of labs. Lorenda CHRISTELLA Code, RMA

## 2024-03-25 ENCOUNTER — Ambulatory Visit: Payer: Self-pay | Admitting: Internal Medicine

## 2024-04-22 ENCOUNTER — Ambulatory Visit: Payer: Self-pay | Admitting: Internal Medicine
